# Patient Record
Sex: Female | Born: 1965 | Race: Black or African American | Hispanic: No | Smoking: Current every day smoker
Health system: Southern US, Community
[De-identification: ages and names within clinical notes are randomized; demographics above are authoritative.]

## PROBLEM LIST (undated history)

## (undated) DIAGNOSIS — E119 Type 2 diabetes mellitus without complications: Secondary | ICD-10-CM

## (undated) DIAGNOSIS — F209 Schizophrenia, unspecified: Secondary | ICD-10-CM

## (undated) DIAGNOSIS — I10 Essential (primary) hypertension: Secondary | ICD-10-CM

## (undated) DIAGNOSIS — H547 Unspecified visual loss: Secondary | ICD-10-CM

## (undated) DIAGNOSIS — D649 Anemia, unspecified: Secondary | ICD-10-CM

## (undated) DIAGNOSIS — C50919 Malignant neoplasm of unspecified site of unspecified female breast: Secondary | ICD-10-CM

## (undated) DIAGNOSIS — G629 Polyneuropathy, unspecified: Secondary | ICD-10-CM

## (undated) DIAGNOSIS — F329 Major depressive disorder, single episode, unspecified: Secondary | ICD-10-CM

## (undated) HISTORY — PX: TOE AMPUTATION: SHX809

---

## 2008-08-22 DIAGNOSIS — G629 Polyneuropathy, unspecified: Secondary | ICD-10-CM

## 2008-08-22 HISTORY — DX: Polyneuropathy, unspecified: G62.9

## 2009-10-03 ENCOUNTER — Inpatient Hospital Stay (HOSPITAL_COMMUNITY): Admission: EM | Admit: 2009-10-03 | Discharge: 2009-10-06 | Payer: Self-pay | Admitting: Emergency Medicine

## 2009-10-03 ENCOUNTER — Encounter (INDEPENDENT_AMBULATORY_CARE_PROVIDER_SITE_OTHER): Payer: Self-pay | Admitting: Internal Medicine

## 2009-10-05 ENCOUNTER — Ambulatory Visit: Payer: Self-pay | Admitting: Infectious Diseases

## 2009-10-12 ENCOUNTER — Inpatient Hospital Stay (HOSPITAL_COMMUNITY): Admission: EM | Admit: 2009-10-12 | Discharge: 2009-10-15 | Payer: Self-pay | Admitting: Emergency Medicine

## 2010-11-11 LAB — BASIC METABOLIC PANEL
BUN: 11 mg/dL (ref 6–23)
BUN: 8 mg/dL (ref 6–23)
BUN: 9 mg/dL (ref 6–23)
Calcium: 8.5 mg/dL (ref 8.4–10.5)
Calcium: 9.8 mg/dL (ref 8.4–10.5)
Creatinine, Ser: 0.56 mg/dL (ref 0.4–1.2)
Creatinine, Ser: 0.61 mg/dL (ref 0.4–1.2)
GFR calc Af Amer: >60 mL/min (ref >60)
GFR calc Af Amer: >60 mL/min (ref >60)
GFR calc non Af Amer: >60 mL/min (ref >60)
Glucose, Bld: 129 mg/dL — ABNORMAL HIGH (ref 70–99)
Glucose, Bld: 194 mg/dL — ABNORMAL HIGH (ref 70–99)
Glucose, Bld: 423 mg/dL — ABNORMAL HIGH (ref 70–99)
Sodium: 132 mEq/L — ABNORMAL LOW (ref 135–145)
Sodium: 134 mEq/L — ABNORMAL LOW (ref 135–145)

## 2010-11-11 LAB — DIFFERENTIAL
Basophils Absolute: 0 10*3/uL (ref 0.0–0.1)
Basophils Relative: 0 % (ref 0–1)
Eosinophils Absolute: 0.2 10*3/uL (ref 0.0–0.7)
Eosinophils Relative: 2 % (ref 0–5)
Lymphs Abs: 1.9 10*3/uL (ref 0.7–4.0)
Lymphs Abs: 2.2 10*3/uL (ref 0.7–4.0)
Monocytes Absolute: 0.1 10*3/uL (ref 0.1–1.0)
Monocytes Relative: 1 % — ABNORMAL LOW (ref 3–12)
Neutro Abs: 7.7 10*3/uL (ref 1.7–7.7)
Neutrophils Relative %: 75 % (ref 43–77)

## 2010-11-11 LAB — CBC
HCT: 32.9 % — ABNORMAL LOW (ref 36.0–46.0)
HCT: 38.9 % (ref 36.0–46.0)
Hemoglobin: 10.9 g/dL — ABNORMAL LOW (ref 12.0–15.0)
Hemoglobin: 11.1 g/dL — ABNORMAL LOW (ref 12.0–15.0)
Hemoglobin: 11.3 g/dL — ABNORMAL LOW (ref 12.0–15.0)
MCHC: 33.7 g/dL (ref 30.0–36.0)
Platelets: 415 10*3/uL — ABNORMAL HIGH (ref 150–400)
RDW: 13.6 % (ref 11.5–15.5)
RDW: 13.8 % (ref 11.5–15.5)
RDW: 13.9 % (ref 11.5–15.5)
RDW: 14.2 % (ref 11.5–15.5)

## 2010-11-11 LAB — GLUCOSE, CAPILLARY
Glucose-Capillary: 103 mg/dL — ABNORMAL HIGH (ref 70–99)
Glucose-Capillary: 165 mg/dL — ABNORMAL HIGH (ref 70–99)
Glucose-Capillary: 179 mg/dL — ABNORMAL HIGH (ref 70–99)
Glucose-Capillary: 207 mg/dL — ABNORMAL HIGH (ref 70–99)
Glucose-Capillary: 267 mg/dL — ABNORMAL HIGH (ref 70–99)
Glucose-Capillary: 85 mg/dL (ref 70–99)

## 2010-11-11 LAB — COMPREHENSIVE METABOLIC PANEL
ALT: 11 U/L (ref 0–35)
AST: 16 U/L (ref 0–37)
Albumin: 3.6 g/dL (ref 3.5–5.2)
Alkaline Phosphatase: 104 U/L (ref 39–117)
Calcium: 9.4 mg/dL (ref 8.4–10.5)
GFR calc Af Amer: >60 mL/min (ref >60)
Glucose, Bld: 312 mg/dL — ABNORMAL HIGH (ref 70–99)
Potassium: 3.9 mEq/L (ref 3.5–5.1)
Sodium: 135 mEq/L (ref 135–145)
Total Protein: 8.3 g/dL (ref 6.0–8.3)

## 2010-11-11 LAB — ANAEROBIC CULTURE

## 2010-11-11 LAB — WOUND CULTURE

## 2010-11-11 LAB — ABO/RH: ABO/RH(D): O POS

## 2010-11-11 LAB — HIGH SENSITIVITY CRP: CRP, High Sensitivity: 8.2 mg/L — ABNORMAL HIGH

## 2010-11-11 LAB — TYPE AND SCREEN
ABO/RH(D): O POS
Antibody Screen: NEGATIVE

## 2010-11-11 LAB — CULTURE, BLOOD (ROUTINE X 2)
Culture: NO GROWTH
Culture: NO GROWTH

## 2010-11-12 ENCOUNTER — Encounter (INDEPENDENT_AMBULATORY_CARE_PROVIDER_SITE_OTHER): Payer: Self-pay | Admitting: Internal Medicine

## 2010-11-18 NOTE — Miscellaneous (Signed)
Summary: Rehab Report  Rehab Report   Imported By: Arta Bruce 11/12/2010 14:35:48  _____________________________________________________________________  External Attachment:    Type:   Image     Comment:   External Document

## 2011-05-24 ENCOUNTER — Inpatient Hospital Stay (HOSPITAL_COMMUNITY)
Admission: EM | Admit: 2011-05-24 | Discharge: 2011-05-27 | DRG: 543 | Disposition: A | Discharge status: Nursing Home (Includes ICF) | Payer: Medicaid Other | Attending: Internal Medicine | Admitting: Internal Medicine

## 2011-05-24 ENCOUNTER — Emergency Department (HOSPITAL_COMMUNITY)
Admission: EM | Admit: 2011-05-24 | Discharge: 2011-05-24 | Discharge status: Against Medical Advice | Payer: Medicaid Other | Attending: Emergency Medicine | Admitting: Emergency Medicine

## 2011-05-24 ENCOUNTER — Emergency Department (HOSPITAL_COMMUNITY): Payer: Medicaid Other

## 2011-05-24 DIAGNOSIS — Z794 Long term (current) use of insulin: Secondary | ICD-10-CM

## 2011-05-24 DIAGNOSIS — E785 Hyperlipidemia, unspecified: Secondary | ICD-10-CM | POA: Diagnosis present

## 2011-05-24 DIAGNOSIS — I10 Essential (primary) hypertension: Secondary | ICD-10-CM | POA: Diagnosis present

## 2011-05-24 DIAGNOSIS — Z0389 Encounter for observation for other suspected diseases and conditions ruled out: Secondary | ICD-10-CM | POA: Insufficient documentation

## 2011-05-24 DIAGNOSIS — M84469A Pathological fracture, unspecified tibia and fibula, initial encounter for fracture: Principal | ICD-10-CM | POA: Diagnosis present

## 2011-05-24 DIAGNOSIS — E119 Type 2 diabetes mellitus without complications: Secondary | ICD-10-CM | POA: Diagnosis present

## 2011-05-24 DIAGNOSIS — Z91199 Patient's noncompliance with other medical treatment and regimen due to unspecified reason: Secondary | ICD-10-CM

## 2011-05-24 DIAGNOSIS — A5211 Tabes dorsalis: Secondary | ICD-10-CM | POA: Diagnosis present

## 2011-05-24 DIAGNOSIS — Z9119 Patient's noncompliance with other medical treatment and regimen: Secondary | ICD-10-CM

## 2011-05-24 DIAGNOSIS — Z79899 Other long term (current) drug therapy: Secondary | ICD-10-CM

## 2011-05-24 DIAGNOSIS — F259 Schizoaffective disorder, unspecified: Secondary | ICD-10-CM | POA: Diagnosis present

## 2011-05-24 DIAGNOSIS — F319 Bipolar disorder, unspecified: Secondary | ICD-10-CM | POA: Diagnosis present

## 2011-05-24 DIAGNOSIS — Z880 Allergy status to penicillin: Secondary | ICD-10-CM

## 2011-05-24 LAB — POCT I-STAT, CHEM 8
BUN: 14 mg/dL (ref 6–23)
Chloride: 102 mEq/L (ref 96–112)
Creatinine, Ser: 0.7 mg/dL (ref 0.50–1.10)
Glucose, Bld: 198 mg/dL — ABNORMAL HIGH (ref 70–99)
Potassium: 3.8 mEq/L (ref 3.5–5.1)

## 2011-05-24 LAB — DIFFERENTIAL
Basophils Absolute: 0 10*3/uL (ref 0.0–0.1)
Basophils Relative: 0 % (ref 0–1)
Eosinophils Absolute: 0.3 10*3/uL (ref 0.0–0.7)
Lymphs Abs: 1.9 10*3/uL (ref 0.7–4.0)
Neutrophils Relative %: 59 % (ref 43–77)

## 2011-05-24 LAB — CBC
Platelets: 364 10*3/uL (ref 150–400)
RBC: 3.81 MIL/uL — ABNORMAL LOW (ref 3.87–5.11)
WBC: 6.8 10*3/uL (ref 4.0–10.5)

## 2011-05-25 ENCOUNTER — Inpatient Hospital Stay (HOSPITAL_COMMUNITY): Payer: Medicaid Other

## 2011-05-25 LAB — HEMOGLOBIN A1C
Hgb A1c MFr Bld: 6.4 % — ABNORMAL HIGH (ref <5.7)
Mean Plasma Glucose: 137 mg/dL — ABNORMAL HIGH (ref <117)

## 2011-05-25 LAB — VALPROIC ACID LEVEL: Valproic Acid Lvl: <10 ug/mL — ABNORMAL LOW (ref 50.0–100.0)

## 2011-05-25 LAB — GLUCOSE, CAPILLARY: Glucose-Capillary: 184 mg/dL — ABNORMAL HIGH (ref 70–99)

## 2011-05-25 LAB — LIPID PANEL
LDL Cholesterol: 74 mg/dL (ref 0–99)
Total CHOL/HDL Ratio: 3.8 RATIO
VLDL: 14 mg/dL (ref 0–40)

## 2011-05-26 LAB — CBC
MCH: 28.4 pg (ref 26.0–34.0)
MCHC: 32.1 g/dL (ref 30.0–36.0)
MCV: 88.4 fL (ref 78.0–100.0)
Platelets: 341 10*3/uL (ref 150–400)

## 2011-05-26 LAB — COMPREHENSIVE METABOLIC PANEL
ALT: 17 U/L (ref 0–35)
AST: 20 U/L (ref 0–37)
Calcium: 9.9 mg/dL (ref 8.4–10.5)
Creatinine, Ser: 0.54 mg/dL (ref 0.50–1.10)
GFR calc Af Amer: >90 mL/min (ref >90)
GFR calc non Af Amer: >90 mL/min (ref >90)
Glucose, Bld: 82 mg/dL (ref 70–99)
Sodium: 140 mEq/L (ref 135–145)
Total Protein: 7.6 g/dL (ref 6.0–8.3)

## 2011-05-26 LAB — GLUCOSE, CAPILLARY
Glucose-Capillary: 126 mg/dL — ABNORMAL HIGH (ref 70–99)
Glucose-Capillary: 49 mg/dL — ABNORMAL LOW (ref 70–99)

## 2011-05-27 LAB — GLUCOSE, CAPILLARY
Glucose-Capillary: 152 mg/dL — ABNORMAL HIGH (ref 70–99)
Glucose-Capillary: 116 mg/dL — ABNORMAL HIGH (ref 70–99)
Glucose-Capillary: 173 mg/dL — ABNORMAL HIGH (ref 70–99)

## 2011-05-31 NOTE — Discharge Summary (Signed)
NAME:  MELIS, TROCHEZ NO.:  [id number]  MEDICAL RECORD NO.:  [id number]  LOCATION:  5038                         FACILITY:  [facility]  PHYSICIAN:  Lonia Blood, M.D.       DATE OF BIRTH:  05/30/1966  DATE OF ADMISSION:  05/24/2011 DATE OF DISCHARGE:                              DISCHARGE SUMMARY   PRIMARY CARE PHYSICIAN:  [facility], Dr. Cranford Mon.  PRIMARY ORTHOPEDIC SURGEON:  [facility] of orthopedic residency program, Dr. Dewaine Conger.  DISCHARGE DIAGNOSES: 1. 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 2. Schizoaffective disorder. 3. Hypertension. 4. Diabetes mellitus type 2. 5. Hyperlipidemia. 6. Morbid obesity.  DISCHARGE MEDICATIONS: 1. Hydrocodone/APAP 5/325 one tablet by mouth every 6 hours needed for     pain. 2. Tylenol 650 mg by mouth every 4 hours as needed for pain. 3. Depakote extended release 500 mg by mouth twice a day. 4. Gabapentin 400 mg by mouth 4 times a day. 5. Lipitor 10 mg daily. 6. Lisinopril 10 mg daily. 7. Metformin 500 mg twice a day. 8. Novolin 70/30, 60 units in the morning 55 units at night. 9. Risperdal 4 mg twice a day. 10.Seroquel 400 mg twice a day.  CONDITION ON DISCHARGE:  Ms. Ament is discharged back to her assisted living.  She is told not to bear weight on the left lower extremity. She is going to use her crutches.  She will follow up with Dr. Dewaine Conger from Orthopedics at the [facility].  PROCEDURE DURING THIS ADMISSION:  Ms. Buske underwent an MRI of her left ankle with findings of displaced fracture dislocation of the ankle with significant associated callus formation consistent with subacute age and neuropathic arthropathy, extensive moderate edema adjacent to the fracture, nonspecific in nature.  No gross bone destruction to suggest  osteomyelitis.  CONSULTATION DURING THIS ADMISSION:  The patient was seen in consultation by Dr. Toni Arthurs with [facility].  HISTORY AND PHYSICAL:  Refer dictated H and P done by Dr. Mikeal Hawthorne.  HOSPITAL COURSE:  Ms. Galvao is a 45 year old woman with known left ankle fracture came to the emergency room to [facility] for a second opinion regarding to her left ankle fracture.  In the middle of the night, there was a concern for possible osteomyelitis as she was admitted with empiric IV antibiotics.  On hospital day #2, we obtained an MRI of her left leg which was suggestive more of neuropathic arthropathy and chronic nonhealing fracture.  Clinically, the patient had no fever or leukocytosis or chills.  Records obtained from [facility] revealed that the patient has been having this problem of left ankle fracture for 2 months and that she has been noncompliant with treatment.  Talking to the patient's legal guardian it became apparent that she was unable to follow through with doctor's instruction by not bearing weight.  Ms. Blunck was seen in consultation by Dr. Toni Arthurs, orthopedic specialist.  He determined that based on the patient's ability to comply with treatment best way for will be to place a cast on the ankle.  Ms. Wurtz refused the cast placement.  She is going to be discharged home with instruction not to bear weight in the left ankle and as-needed medication for analgesia. At this point in time, even though Ms. Poppe has a guardian we cannot legally sedate her and confine her to bed for 6 weeks to operatively fix this fracture.  The patient has made her choice of attempting conservative treatment which has a chance of failing and the patient losing her limp.  She is willing to accept that chance.     Lonia Blood, M.D.     SL/MEDQ  D:  05/27/2011  T:  05/27/2011  Job:  [id number]  Electronically Signed by Lonia Blood  M.D. on 05/31/2011 05:08:00 PM

## 2011-06-01 LAB — CULTURE, BLOOD (ROUTINE X 2)
Culture  Setup Time: 201210040036
Culture: NO GROWTH

## 2011-06-04 NOTE — H&P (Signed)
NAME:  Regina Watson, Regina Watson           ACCOUNT NO.:  [id number]  MEDICAL RECORD NO.:  [id number]  LOCATION:  5038                         FACILITY:  [facility]  PHYSICIAN:  Lonia Blood, M.D.      DATE OF BIRTH:  06-21-1966  DATE OF ADMISSION:  05/24/2011 DATE OF DISCHARGE:                             HISTORY & PHYSICAL   PRIMARY CARE PHYSICIAN:  The patient is currently unassigned.  She sees a doctor in [place].  PRESENTING COMPLAINT:  Left lower extremity pain and swelling.  HISTORY OF PRESENT ILLNESS:  The patient is a 45 year old female with insulin-dependent diabetes type 2 who apparently had a fall and fracture of the left ankle about 2 to 3 months ago.  She saw an orthopedic surgeon in [place].  She was placed in a cast as well as the leg was wrapped according to her and a boot was given to her at some point. She has been told that if things did not go well, she may have to have her leg amputated.  The patient has had poor control of her diabetes with prior amputation of her left second toe here about 2 years ago. Since then, she has not improved tremendously.  She started having severe pain today here in [place], pain and swelling of the foot. She tried to use crutches but still the pain was persistent, so she decided to come in for further management.  She has had prior admission on the same leg back in February 2011 secondary to gangrenous foot and cellulitis.  Here, work up today indicated possible osteomyelitis of the foot.  She described her pain as 10 out of 10 as high, although currently down to 5/10 after medications.  No discharge.  No nausea, vomiting, or diarrhea.  No fever or chills.  PAST MEDICAL HISTORY: 1. Hypertension. 2. Diabetes. 3. Hyperlipidemia. 4. History of schizophrenia with bipolar component. 5. History of prior amputation of the left second toe. 6. Diabetic foot ulcer. 7. Morbid obesity.  ALLERGIES:  PENICILLIN.  CURRENT  MEDICATIONS:  Depakote, Glucophage, Novolin 70/30 insulin, and Risperdal.  SOCIAL HISTORY:  The patient lives in [place].  She apparently quit smoking.  She has been a smoker in the past.  She is widowed, but no children.  Denied any alcohol or IV drug use.  FAMILY HISTORY:  Significant mainly for diabetes.  REVIEW OF SYSTEMS:  All systems reviewed, currently are negative except per HPI.  PHYSICAL EXAMINATION:  VITAL SIGNS:  Temperature is 98.5 orally, blood pressure 123/82, pulse 98, respiratory rate of 18, and saturation 99% room air. GENERAL:  The patient is obese in no acute distress. HEENT:  PERRL.  EOMI.  No significant pallor. No jaundice.  No rhinorrhea. NECK:  Supple.  No JVD.  No lymphadenopathy. RESPIRATORY:  Good air entry bilaterally.  No significant wheezes.  No rales.  No crackles. CARDIOVASCULAR:  Slightly tachycardic. ABDOMEN:  Obese, soft, and nontender with positive bowel sounds. EXTREMITIES:  Left lower extremity is swollen, tender, darkened, status post amputation of the second toe.  It is very tender to touch especially the ankle over the metatarsals. SKIN:  Chronic dermatitis changes involving the left lower extremity but also some part of lower  extremities.  Multiple scars in the lower extremities bilaterally. PSYCHIATRIC:  The patient is slow to act, but in general, awake, alert, and oriented x3 with flat affect.  Not suicidal, not homicidal. NEUROLOGIC:  Nonfocal.  LABORATORY DATA:  Her Depakote level is less than 10.  White count is 6.8, hemoglobin of 11.4 with platelet count of 364, normal differentials.  Her sodium is 138, potassium 3.8, chloride is 102, glucose 198, BUN 14, and creatinine 0.70.  Left ankle x-ray showed left medial and lateral malleolar fracture dislocation with exuberant callus formation around the distal tubular fracture, this suggest nonacute healing fracture.  Left foot x-ray complete showed previous left second toe  amputation at the level of the distal metatarsal.  There is interval development of joint space irregularity of the proximal first metatarsal joint with sclerosis and periosteal reaction in the distal metatarsal suggesting healing fracture or infection.  ASSESSMENT AND PLAN:  This is a 45 year old female with complex medical history and known history of prior amputation due to nonhealing ulcer presenting with what appears to be possible osteomyelitis in addition to nonhealing fracture.  The patient is having pain now, but no white count, no fever.  Our plan will be: 1. Possible osteomyelitis.  We will admit the patient for observation.     She will probably require an MRI of the foot for further     clarification.  I will start her on IV vancomycin empirically.  She     has her orthopedic surgeon in [place] and I may suggest that     once her workup is completed and she starts taking antibiotics, she     may need to be referred back to the same orthopedic surgeon for     further treatment.  If however, there is need for her to see a     local orthopedic surgeon here, we will get someone consulted to     advice. 2. Diabetes.  Continue with home medication and sliding scale insulin. 3. Schizophrenia and bipolar disorder.  Again, continue with her home     medications.  Her Depakote seems to be on the low almost not     detectable, indicating probably noncompliance.  We will resume her     home medications and follow closely here.  The patient will follow     up later with her primary care physician. 4. Left ankle fracture.  Again, this is an old fracture that is     already known by her orthopedic surgeon in [place] which     makes this probably better for them to take care of her. 5. Hypertension.  She apparently takes lisinopril and we will give it     to her.  Further treatment will depend on the patient's response in     the hospital but hopefully we will stabilize her and  refer her back     to her orthopedic surgeon.     Lonia Blood, M.D.     Verlin Grills  D:  05/25/2011  T:  05/25/2011  Job:  [id number]  Electronically Signed by Lonia Blood M.D. on 06/04/2011 03:22:41 PM

## 2013-12-09 ENCOUNTER — Encounter (HOSPITAL_COMMUNITY): Payer: Self-pay | Admitting: Emergency Medicine

## 2013-12-09 ENCOUNTER — Emergency Department (HOSPITAL_COMMUNITY): Payer: Medicaid Other

## 2013-12-09 ENCOUNTER — Emergency Department (HOSPITAL_COMMUNITY)
Admission: EM | Admit: 2013-12-09 | Discharge: 2013-12-10 | Disposition: A | Discharge status: Home / Self Care | Payer: Medicaid Other | Attending: Emergency Medicine | Admitting: Emergency Medicine

## 2013-12-09 DIAGNOSIS — Z880 Allergy status to penicillin: Secondary | ICD-10-CM | POA: Insufficient documentation

## 2013-12-09 DIAGNOSIS — I10 Essential (primary) hypertension: Secondary | ICD-10-CM | POA: Insufficient documentation

## 2013-12-09 DIAGNOSIS — Z789 Other specified health status: Secondary | ICD-10-CM

## 2013-12-09 DIAGNOSIS — IMO0002 Reserved for concepts with insufficient information to code with codable children: Secondary | ICD-10-CM | POA: Insufficient documentation

## 2013-12-09 DIAGNOSIS — J40 Bronchitis, not specified as acute or chronic: Secondary | ICD-10-CM

## 2013-12-09 DIAGNOSIS — Z862 Personal history of diseases of the blood and blood-forming organs and certain disorders involving the immune mechanism: Secondary | ICD-10-CM | POA: Insufficient documentation

## 2013-12-09 DIAGNOSIS — J209 Acute bronchitis, unspecified: Secondary | ICD-10-CM | POA: Insufficient documentation

## 2013-12-09 DIAGNOSIS — F411 Generalized anxiety disorder: Secondary | ICD-10-CM | POA: Insufficient documentation

## 2013-12-09 DIAGNOSIS — F209 Schizophrenia, unspecified: Secondary | ICD-10-CM | POA: Insufficient documentation

## 2013-12-09 DIAGNOSIS — F17200 Nicotine dependence, unspecified, uncomplicated: Secondary | ICD-10-CM | POA: Insufficient documentation

## 2013-12-09 DIAGNOSIS — E119 Type 2 diabetes mellitus without complications: Secondary | ICD-10-CM | POA: Insufficient documentation

## 2013-12-09 HISTORY — DX: Anemia, unspecified: D64.9

## 2013-12-09 HISTORY — DX: Schizophrenia, unspecified: F20.9

## 2013-12-09 HISTORY — DX: Essential (primary) hypertension: I10

## 2013-12-09 HISTORY — DX: Type 2 diabetes mellitus without complications: E11.9

## 2013-12-09 LAB — URINALYSIS, ROUTINE W REFLEX MICROSCOPIC
Bilirubin Urine: NEGATIVE
Glucose, UA: NEGATIVE mg/dL
Hgb urine dipstick: NEGATIVE
Ketones, ur: NEGATIVE mg/dL
LEUKOCYTES UA: NEGATIVE
Nitrite: NEGATIVE
PROTEIN: NEGATIVE mg/dL
Specific Gravity, Urine: 1.02 (ref 1.005–1.030)
UROBILINOGEN UA: 0.2 mg/dL (ref 0.0–1.0)
pH: 6 (ref 5.0–8.0)

## 2013-12-09 LAB — COMPREHENSIVE METABOLIC PANEL
ALT: 20 U/L (ref 0–35)
AST: 21 U/L (ref 0–37)
Albumin: 3.9 g/dL (ref 3.5–5.2)
Alkaline Phosphatase: 110 U/L (ref 39–117)
BUN: 12 mg/dL (ref 6–23)
CALCIUM: 9.9 mg/dL (ref 8.4–10.5)
CO2: 25 mEq/L (ref 19–32)
CREATININE: 0.59 mg/dL (ref 0.50–1.10)
Chloride: 101 mEq/L (ref 96–112)
GLUCOSE: 186 mg/dL — AB (ref 70–99)
Potassium: 4.2 mEq/L (ref 3.7–5.3)
SODIUM: 138 meq/L (ref 137–147)
Total Bilirubin: 0.2 mg/dL — ABNORMAL LOW (ref 0.3–1.2)
Total Protein: 7.8 g/dL (ref 6.0–8.3)

## 2013-12-09 LAB — CBC WITH DIFFERENTIAL/PLATELET
BASOS ABS: 0 10*3/uL (ref 0.0–0.1)
Basophils Relative: 0 % (ref 0–1)
EOS PCT: 2 % (ref 0–5)
Eosinophils Absolute: 0.2 10*3/uL (ref 0.0–0.7)
HEMATOCRIT: 40.4 % (ref 36.0–46.0)
Hemoglobin: 13.3 g/dL (ref 12.0–15.0)
Lymphocytes Relative: 29 % (ref 12–46)
Lymphs Abs: 2.4 10*3/uL (ref 0.7–4.0)
MCH: 30.1 pg (ref 26.0–34.0)
MCHC: 32.9 g/dL (ref 30.0–36.0)
MCV: 91.4 fL (ref 78.0–100.0)
Monocytes Absolute: 0.5 10*3/uL (ref 0.1–1.0)
Monocytes Relative: 6 % (ref 3–12)
Neutro Abs: 5.1 10*3/uL (ref 1.7–7.7)
Neutrophils Relative %: 63 % (ref 43–77)
Platelets: 324 10*3/uL (ref 150–400)
RBC: 4.42 MIL/uL (ref 3.87–5.11)
RDW: 14.1 % (ref 11.5–15.5)
WBC: 8.2 10*3/uL (ref 4.0–10.5)

## 2013-12-09 LAB — ETHANOL: Alcohol, Ethyl (B): <11 mg/dL (ref 0–11)

## 2013-12-09 MED ORDER — ALBUTEROL SULFATE (2.5 MG/3ML) 0.083% IN NEBU
5.0000 mg | INHALATION_SOLUTION | Freq: Once | RESPIRATORY_TRACT | Status: DC
Start: 2013-12-09 — End: 2013-12-09

## 2013-12-09 MED ORDER — ALBUTEROL SULFATE (2.5 MG/3ML) 0.083% IN NEBU
2.5000 mg | INHALATION_SOLUTION | Freq: Once | RESPIRATORY_TRACT | Status: AC
  Administered 2013-12-09: 2.5 mg via RESPIRATORY_TRACT
  Filled 2013-12-09: qty 3

## 2013-12-09 MED ORDER — IPRATROPIUM BROMIDE 0.02 % IN SOLN: 0.5000 mg | Freq: Once | RESPIRATORY_TRACT | Status: DC

## 2013-12-09 MED ORDER — IPRATROPIUM-ALBUTEROL 0.5-2.5 (3) MG/3ML IN SOLN
3.0000 mL | Freq: Once | RESPIRATORY_TRACT | Status: AC
  Administered 2013-12-09: 3 mL via RESPIRATORY_TRACT
  Filled 2013-12-09: qty 3
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
## 2013-12-10 MED ORDER — ALBUTEROL SULFATE HFA 108 (90 BASE) MCG/ACT IN AERS
2.0000 | INHALATION_SPRAY | RESPIRATORY_TRACT | Status: DC | PRN
  Administered 2013-12-10: 2 via RESPIRATORY_TRACT
  Filled 2013-12-10: qty 6.7

## 2013-12-10 MED ORDER — AZITHROMYCIN 250 MG PO TABS: ORAL_TABLET | ORAL | Status: DC

## 2013-12-10 MED ORDER — AEROCHAMBER Z-STAT PLUS/MEDIUM MISC: 1.0000 | Freq: Once | Status: DC

## 2013-12-10 NOTE — Discharge Instructions (Signed)
Contact your guardian tomorrow and discuss your concerns about your currently living situation.  Return to the ED if you feel you may do something to hurt yourself or someone else. Use the inhaler 2 puffs every 4-6 hrs for wheezing. Take the antibiotic until gone.

## 2016-01-08 ENCOUNTER — Emergency Department (HOSPITAL_COMMUNITY)
Admission: EM | Admit: 2016-01-08 | Discharge: 2016-01-09 | Discharge status: Against Medical Advice | Payer: Medicaid Other | Attending: Emergency Medicine | Admitting: Emergency Medicine

## 2016-01-08 ENCOUNTER — Encounter (HOSPITAL_COMMUNITY): Payer: Self-pay | Admitting: Emergency Medicine

## 2016-01-08 DIAGNOSIS — Z041 Encounter for examination and observation following transport accident: Secondary | ICD-10-CM | POA: Diagnosis present

## 2016-01-08 DIAGNOSIS — F209 Schizophrenia, unspecified: Secondary | ICD-10-CM | POA: Insufficient documentation

## 2016-01-08 DIAGNOSIS — E119 Type 2 diabetes mellitus without complications: Secondary | ICD-10-CM | POA: Insufficient documentation

## 2016-01-08 DIAGNOSIS — F17200 Nicotine dependence, unspecified, uncomplicated: Secondary | ICD-10-CM | POA: Insufficient documentation

## 2016-01-08 DIAGNOSIS — I10 Essential (primary) hypertension: Secondary | ICD-10-CM | POA: Diagnosis not present

## 2016-01-08 LAB — CBC WITH DIFFERENTIAL/PLATELET
BASOS ABS: 0 10*3/uL (ref 0.0–0.1)
Basophils Relative: 0 %
EOS ABS: 0.3 10*3/uL (ref 0.0–0.7)
EOS PCT: 4 %
HCT: 34.6 % — ABNORMAL LOW (ref 36.0–46.0)
Hemoglobin: 11.4 g/dL — ABNORMAL LOW (ref 12.0–15.0)
Lymphocytes Relative: 37 %
Lymphs Abs: 3 10*3/uL (ref 0.7–4.0)
MCH: 31.5 pg (ref 26.0–34.0)
MCHC: 32.9 g/dL (ref 30.0–36.0)
MCV: 95.6 fL (ref 78.0–100.0)
Monocytes Absolute: 0.5 10*3/uL (ref 0.1–1.0)
Monocytes Relative: 6 %
Neutro Abs: 4.3 10*3/uL (ref 1.7–7.7)
Neutrophils Relative %: 53 %
PLATELETS: 248 10*3/uL (ref 150–400)
RBC: 3.62 MIL/uL — ABNORMAL LOW (ref 3.87–5.11)
RDW: 12.9 % (ref 11.5–15.5)
WBC: 8.2 10*3/uL (ref 4.0–10.5)

## 2016-01-08 NOTE — ED Notes (Signed)
Patient from [facility] with complaint of visual and auditory hallucinations. States "I think the house if haunted, I see these people when I close my eyes and then when I open them and they tell me they are going to kill me." Denies SI/HI.

## 2016-01-09 LAB — URINALYSIS, ROUTINE W REFLEX MICROSCOPIC
BILIRUBIN URINE: NEGATIVE
GLUCOSE, UA: NEGATIVE mg/dL
Hgb urine dipstick: NEGATIVE
Ketones, ur: 15 mg/dL — AB
LEUKOCYTES UA: NEGATIVE
Nitrite: NEGATIVE
PH: 5.5 (ref 5.0–8.0)
Protein, ur: NEGATIVE mg/dL
Specific Gravity, Urine: 1.025 (ref 1.005–1.030)

## 2016-01-09 LAB — COMPREHENSIVE METABOLIC PANEL
ALT: 19 U/L (ref 14–54)
ANION GAP: 9 (ref 5–15)
AST: 19 U/L (ref 15–41)
Albumin: 3.7 g/dL (ref 3.5–5.0)
Alkaline Phosphatase: 67 U/L (ref 38–126)
BUN: 20 mg/dL (ref 6–20)
CHLORIDE: 105 mmol/L (ref 101–111)
CO2: 23 mmol/L (ref 22–32)
Calcium: 9.2 mg/dL (ref 8.9–10.3)
Creatinine, Ser: 0.62 mg/dL (ref 0.44–1.00)
Glucose, Bld: 133 mg/dL — ABNORMAL HIGH (ref 65–99)
Potassium: 4 mmol/L (ref 3.5–5.1)
SODIUM: 137 mmol/L (ref 135–145)
Total Bilirubin: 0.3 mg/dL (ref 0.3–1.2)
Total Protein: 7.1 g/dL (ref 6.5–8.1)

## 2016-01-09 LAB — RAPID URINE DRUG SCREEN, HOSP PERFORMED
AMPHETAMINES: NOT DETECTED
Barbiturates: NOT DETECTED
Benzodiazepines: NOT DETECTED
COCAINE: NOT DETECTED
OPIATES: NOT DETECTED
Tetrahydrocannabinol: NOT DETECTED

## 2016-01-09 LAB — PREGNANCY, URINE: Preg Test, Ur: NEGATIVE

## 2016-01-09 LAB — ETHANOL

## 2016-01-09 NOTE — ED Provider Notes (Signed)
  Physical Exam  BP 152/88 mmHg  Pulse 83  Temp(Src) 98 F (36.7 C) (Oral)  Resp 16  Ht 5\' 9"  (1.753 m)  Wt 205 lb (92.987 kg)  BMI 30.26 kg/m2  SpO2 97%  LMP 12/26/2015  Physical Exam  ED Course  Procedures  MDM Patient no longer wants to stay. Reviewed notes and with psychiatry. They feel she would benefit from inpatient treatment but she does not meet criteria for involuntary commitment at this time. Patient is not willing to stay. Aware of risks. Will discharge AMA.      Davonna Belling, MD 01/09/16 1336

## 2016-01-09 NOTE — [facility] Counselor (Signed)
Clinician consulted with Elmarie Shiley NP who determined that pt meets inpatient criteria at this time. TTS to seek placement. RN notified.   Bedelia Person, M.S., LPCA, [place], [facility] Licensed Professional Counselor Associate  Triage Specialist  [facility]  Therapeutic Triage Services Phone: [phone] Fax: [phone]

## 2016-01-09 NOTE — ED Notes (Signed)
Pt given lunch meal tray. 

## 2016-01-09 NOTE — BH Assessment (Signed)
RN notified counselor that pt did not want to go inpatient and states that she feels like she can follow up with her outpatient provider at [facility] and she also stated in the assessment that she has an ACT team. Counselor consulted with Elmarie Shiley, NP who did not feel like she was a safety risk to herself or others and could be cleared for discharge to her outpatient provider. Pt is voluntary and does not meet involuntary commitment criteria. RN notified that she is cleared by psychiatry to be discharged if EDP agrees with this assessment.   Bedelia Person, M.S., LPCA, [place], [facility] Licensed Professional Counselor Associate  Triage Specialist  [facility]  Therapeutic Triage Services Phone: [phone] Fax: [phone]

## 2016-01-09 NOTE — ED Notes (Signed)
Spoke with Erasmo Downer at [facility]. States that she will speak to NP when available to discuss situation. Will call back with update ASAP. Pt notified of plan and verbalized understanding.

## 2016-01-09 NOTE — ED Notes (Addendum)
Dr. Alvino Chapel notified.

## 2016-01-09 NOTE — ED Notes (Signed)
Pt was able to participate in TTS this morning.  Continues to be very sleepy but is easily aroused when talked to.  States her meds make her this drowsy on a daily basis and that is why she feels her doses need to be adjusted.

## 2016-01-09 NOTE — ED Notes (Signed)
Got in touch with [facility]. States they will send someone ASAP to pick up patient.   [phone]

## 2016-01-09 NOTE — ED Notes (Signed)
Pt awake at this time eating breakfast.  Will notify [facility] pt can participate in TTS at this time.

## 2016-01-09 NOTE — Discharge Instructions (Signed)
Schizophrenia °Schizophrenia is a mental illness. It may cause disturbed or disorganized thinking, speech, or behavior. People with schizophrenia have problems functioning in one or more areas of life: work, school, home, or relationships. People with schizophrenia are at increased risk for suicide, certain chronic physical illnesses, and unhealthy behaviors, such as smoking and drug use. °People who have family members with schizophrenia are at higher risk of developing the illness. Schizophrenia affects men and women equally but usually appears at an earlier age (teenage or early adult years) in men.  °SYMPTOMS °The earliest symptoms are often subtle (prodrome) and may go unnoticed until the illness becomes more severe (first-break psychosis). Symptoms of schizophrenia may be continuous or may come and go in severity. Episodes often are triggered by major life events, such as family stress, college, military service, marriage, pregnancy or child birth, divorce, or loss of a loved one. People with schizophrenia may see, hear, or feel things that do not exist (hallucinations). They may have false beliefs in spite of obvious proof to the contrary (delusions). Sometimes speech is incoherent or behavior is odd or withdrawn.  °DIAGNOSIS °Schizophrenia is diagnosed through an assessment by your caregiver. Your caregiver will ask questions about your thoughts, behavior, mood, and ability to function in daily life. Your caregiver may ask questions about your medical history and use of alcohol or drugs, including prescription medication. Your caregiver may also order blood tests and imaging exams. Certain medical conditions and substances can cause symptoms that resemble schizophrenia. Your caregiver may refer you to a mental health specialist for evaluation. There are three major criterion for a diagnosis of schizophrenia: °· Two or more of the following five symptoms are present for a month or longer: °¨ Delusions. Often  the delusions are that you are being attacked, harassed, cheated, persecuted or conspired against (persecutory delusions). °¨ Hallucinations.   °¨ Disorganized speech that does not make sense to others. °¨ Grossly disorganized (confused or unfocused) behavior or extremely overactive or underactive motor activity (catatonia). °¨ Negative symptoms such as bland or blunted emotions (flat affect), loss of will power (avolition), and withdrawal from social contacts (social isolation). °· Level of functioning in one or more major areas of life (work, school, relationships, or self-care) is markedly below the level of functioning before the onset of illness.   °· There are continuous signs of illness (either mild symptoms or decreased level of functioning) for at least 6 months or longer. °TREATMENT  °Schizophrenia is a long-term illness. It is best controlled with continuous treatment rather than treatment only when symptoms occur. The following treatments are used to manage schizophrenia: °· Medication--Medication is the most effective and important form of treatment for schizophrenia. Antipsychotic medications are usually prescribed to help manage schizophrenia. Other types of medication may be added to relieve any symptoms that may occur despite the use of antipsychotic medications. °· Counseling or talk therapy--Individual, group, or family counseling may be helpful in providing education, support, and guidance. Many people with schizophrenia also benefit from social skills and job skills (vocational) training. °A combination of medication and counseling is best for managing the disorder over time. A procedure in which electricity is applied to the brain through the scalp (electroconvulsive therapy) may be used to treat catatonic schizophrenia or schizophrenia in people who cannot take or do not respond to medication and counseling. °  °This information is not intended to replace advice given to you by your health  care provider. Make sure you discuss any questions you have with   your health care provider. °  °Document Released: 08/05/2000 Document Revised: 08/29/2014 Document Reviewed: 10/31/2012 °Elsevier Interactive Patient Education ©2016 [employer] ° °

## 2016-01-09 NOTE — ED Provider Notes (Signed)
CSN: [id number]     Arrival date & time 01/08/16  2205 History   First MD Initiated Contact with Patient 01/08/16 2300     Chief Complaint  Patient presents with  . V70.1     (Consider location/radiation/quality/duration/timing/severity/associated sxs/prior Treatment) The history is provided by the patient.   Regina Watson is a 50 y.o. female with a history of schizophrenia, DM and htn presenting from her group home residence with complaints of visual and auditory hallucinations which have been present for a few days.  She describes seeing lots of people floating in her room who speak to her in anger, stating "they hate me and tell me they are going to kill me. "   She endorses feeling scared and is afraid to close her eyes. She is very drowsy currently and states she was given her "nighttime medicine" before being sent here and states her risperdal was recently increased.  She denies suicidal or homicidal ideation and has no other complaints.    Past Medical History  Diagnosis Date  . Diabetes mellitus without complication ([place])   . Hypertension   . Schizophrenia ([place])   . Anemia    Past Surgical History  Procedure Laterality Date  . Toe amputation     History reviewed. No pertinent family history. Social History  Substance Use Topics  . Smoking status: Current Every Day Smoker  . Smokeless tobacco: None  . Alcohol Use: No   OB History    No data available     Review of Systems    Allergies  Penicillins  Home Medications   Prior to Admission medications   Medication Sig Start Date End Date Taking? Authorizing Provider  azithromycin (ZITHROMAX Z-PAK) 250 MG tablet Take 2 po the first day then once a day for the next 4 days. 12/10/13   Rolland Porter, MD   BP 165/92 mmHg  Pulse 94  Temp(Src) 97.9 F (36.6 C) (Oral)  Resp 20  Ht 5\' 9"  (1.753 m)  Wt 92.987 kg  BMI 30.26 kg/m2  SpO2 99%  LMP 12/26/2015 Physical Exam  Constitutional: She appears well-developed  and well-nourished. No distress.  Drowsy.  Sleeping upon first contact with pt.  HENT:  Head: Normocephalic and atraumatic.  Eyes: Conjunctivae are normal.  Neck: Normal range of motion.  Cardiovascular: Normal rate, regular rhythm, normal heart sounds and intact distal pulses.   Pulmonary/Chest: Effort normal and breath sounds normal. She has no wheezes.  Abdominal: Soft. Bowel sounds are normal. There is no tenderness.  Musculoskeletal: Normal range of motion.  Neurological: She is alert. She has normal strength. No cranial nerve deficit.  Skin: Skin is warm and dry.  Psychiatric: Her speech is not rapid and/or pressured. She is slowed and actively hallucinating. Thought content is delusional.  Sleepy.  Drifts to sleep when not stimulated.  Nursing note and vitals reviewed.   ED Course  Procedures (including critical care time) Labs Review Labs Reviewed  CBC WITH DIFFERENTIAL/PLATELET - Abnormal; Notable for the following:    RBC 3.62 (*)    Hemoglobin 11.4 (*)    HCT 34.6 (*)    All other components within normal limits  COMPREHENSIVE METABOLIC PANEL - Abnormal; Notable for the following:    Glucose, Bld 133 (*)    All other components within normal limits  URINALYSIS, ROUTINE W REFLEX MICROSCOPIC (NOT AT [facility]) - Abnormal; Notable for the following:    Ketones, ur 15 (*)    All other components within normal limits  ETHANOL  URINE RAPID DRUG SCREEN, HOSP PERFORMED  PREGNANCY, URINE    Imaging Review No results found. I have personally reviewed and evaluated these images and lab results as part of my medical decision-making.   EKG Interpretation None      MDM   Final diagnoses:  Schizophrenia, unspecified type ([place])    Pt medically cleared for TTS consult.  She is cooperative at this time, here voluntarily with no suicidal or homicidal ideation.  Pt is delusional with visual and auditory hallucinations but does not appear to be a danger to herself or others.  She  would benefit from TTS consult.  Nursing staff currently working on obtaining list of current meds from group home.   Psych holding orders placed.    Evalee Jefferson, PA-C 01/09/16 [place], DO 01/09/16 [phone]

## 2016-01-09 NOTE — ED Notes (Signed)
Pt called this RN into room and stated that she wanted to go home and will follow up with PCP. States she is having no thoughts of SI/HI. Pt informed that she has been recommended for inpatient treatment, but will notify EDP and [facility] of her concerns.

## 2016-01-09 NOTE — ED Notes (Signed)
Spoke with Erasmo Downer at [facility], states that she is clear psychiatrically to go home as long as EDP has no concerns. Will notify EDP.

## 2016-01-09 NOTE — ED Notes (Signed)
[facility] representative arrived to transport patient back to facility.

## 2016-01-09 NOTE — ED Notes (Signed)
Pt still unable to remain alert enough for TTS. Spoke with Ernestene Kiel faxed over, reviewed by [facility], placed in patient bin
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
## 2016-01-27 ENCOUNTER — Emergency Department (HOSPITAL_COMMUNITY)
Admission: EM | Admit: 2016-01-27 | Discharge: 2016-01-29 | Disposition: A | Discharge status: Home / Self Care | Payer: Medicaid Other | Attending: Emergency Medicine | Admitting: Emergency Medicine

## 2016-01-27 ENCOUNTER — Encounter (HOSPITAL_COMMUNITY): Payer: Self-pay | Admitting: Emergency Medicine

## 2016-01-27 DIAGNOSIS — E119 Type 2 diabetes mellitus without complications: Secondary | ICD-10-CM | POA: Insufficient documentation

## 2016-01-27 DIAGNOSIS — Z7984 Long term (current) use of oral hypoglycemic drugs: Secondary | ICD-10-CM | POA: Diagnosis not present

## 2016-01-27 DIAGNOSIS — F209 Schizophrenia, unspecified: Secondary | ICD-10-CM | POA: Insufficient documentation

## 2016-01-27 DIAGNOSIS — Z79899 Other long term (current) drug therapy: Secondary | ICD-10-CM | POA: Insufficient documentation

## 2016-01-27 DIAGNOSIS — F17200 Nicotine dependence, unspecified, uncomplicated: Secondary | ICD-10-CM | POA: Insufficient documentation

## 2016-01-27 DIAGNOSIS — N390 Urinary tract infection, site not specified: Secondary | ICD-10-CM | POA: Insufficient documentation

## 2016-01-27 DIAGNOSIS — R443 Hallucinations, unspecified: Secondary | ICD-10-CM | POA: Diagnosis present

## 2016-01-27 DIAGNOSIS — I10 Essential (primary) hypertension: Secondary | ICD-10-CM | POA: Diagnosis not present

## 2016-01-27 LAB — CBC
HCT: 39.2 % (ref 36.0–46.0)
HEMOGLOBIN: 13.1 g/dL (ref 12.0–15.0)
MCH: 31.6 pg (ref 26.0–34.0)
MCHC: 33.4 g/dL (ref 30.0–36.0)
MCV: 94.7 fL (ref 78.0–100.0)
Platelets: 240 10*3/uL (ref 150–400)
RBC: 4.14 MIL/uL (ref 3.87–5.11)
RDW: 13.1 % (ref 11.5–15.5)
WBC: 8 10*3/uL (ref 4.0–10.5)

## 2016-01-27 LAB — COMPREHENSIVE METABOLIC PANEL
ALT: 23 U/L (ref 14–54)
AST: 23 U/L (ref 15–41)
Albumin: 4.4 g/dL (ref 3.5–5.0)
Alkaline Phosphatase: 87 U/L (ref 38–126)
Anion gap: 5 (ref 5–15)
BILIRUBIN TOTAL: 0.2 mg/dL — AB (ref 0.3–1.2)
BUN: 14 mg/dL (ref 6–20)
CALCIUM: 9.4 mg/dL (ref 8.9–10.3)
CO2: 28 mmol/L (ref 22–32)
CREATININE: 0.51 mg/dL (ref 0.44–1.00)
Chloride: 105 mmol/L (ref 101–111)
GFR calc Af Amer: >60 mL/min (ref >60)
Glucose, Bld: 123 mg/dL — ABNORMAL HIGH (ref 65–99)
Potassium: 4 mmol/L (ref 3.5–5.1)
SODIUM: 138 mmol/L (ref 135–145)
TOTAL PROTEIN: 8.2 g/dL — AB (ref 6.5–8.1)

## 2016-01-27 LAB — URINE MICROSCOPIC-ADD ON

## 2016-01-27 LAB — SALICYLATE LEVEL: Salicylate Lvl: <4 mg/dL (ref 2.8–30.0)

## 2016-01-27 LAB — URINALYSIS, ROUTINE W REFLEX MICROSCOPIC
BILIRUBIN URINE: NEGATIVE
Glucose, UA: 250 mg/dL — AB
Ketones, ur: 15 mg/dL — AB
NITRITE: POSITIVE — AB
Protein, ur: >300 mg/dL — AB
SPECIFIC GRAVITY, URINE: 1.02 (ref 1.005–1.030)
pH: 6.5 (ref 5.0–8.0)

## 2016-01-27 LAB — RAPID URINE DRUG SCREEN, HOSP PERFORMED
Amphetamines: NOT DETECTED
Barbiturates: NOT DETECTED
Benzodiazepines: NOT DETECTED
Cocaine: NOT DETECTED
OPIATES: NOT DETECTED
TETRAHYDROCANNABINOL: NOT DETECTED

## 2016-01-27 LAB — ETHANOL: Alcohol, Ethyl (B): <5 mg/dL (ref <5)

## 2016-01-27 LAB — PREGNANCY, URINE: PREG TEST UR: NEGATIVE

## 2016-01-27 LAB — ACETAMINOPHEN LEVEL

## 2016-01-27 MED ORDER — BENZTROPINE MESYLATE 1 MG PO TABS
0.5000 mg | ORAL_TABLET | Freq: Two times a day (BID) | ORAL | Status: DC
  Administered 2016-01-27 – 2016-01-29 (×5): 0.5 mg via ORAL
  Filled 2016-01-27 (×5): qty 1

## 2016-01-27 MED ORDER — QUETIAPINE FUMARATE ER 400 MG PO TB24
800.0000 mg | ORAL_TABLET | Freq: Every day | ORAL | Status: DC
  Administered 2016-01-27 – 2016-01-28 (×2): 800 mg via ORAL
  Filled 2016-01-27 (×5): qty 2

## 2016-01-27 MED ORDER — ALUM & MAG HYDROXIDE-SIMETH 200-200-20 MG/5ML PO SUSP: 30.0000 mL | ORAL | Status: DC | PRN

## 2016-01-27 MED ORDER — ATORVASTATIN CALCIUM 10 MG PO TABS
10.0000 mg | ORAL_TABLET | Freq: Every day | ORAL | Status: DC
  Administered 2016-01-28 – 2016-01-29 (×2): 10 mg via ORAL
  Filled 2016-01-27 (×5): qty 1

## 2016-01-27 MED ORDER — LORAZEPAM 1 MG PO TABS
1.0000 mg | ORAL_TABLET | Freq: Three times a day (TID) | ORAL | Status: DC | PRN
  Filled 2016-01-27: qty 1

## 2016-01-27 MED ORDER — METFORMIN HCL 500 MG PO TABS
1000.0000 mg | ORAL_TABLET | Freq: Two times a day (BID) | ORAL | Status: DC
  Administered 2016-01-27 – 2016-01-29 (×3): 1000 mg via ORAL
  Filled 2016-01-27 (×4): qty 2

## 2016-01-27 MED ORDER — HYDROXYZINE HCL 25 MG PO TABS
25.0000 mg | ORAL_TABLET | Freq: Three times a day (TID) | ORAL | Status: DC
  Administered 2016-01-27 – 2016-01-29 (×5): 25 mg via ORAL
  Filled 2016-01-27 (×6): qty 1

## 2016-01-27 MED ORDER — GABAPENTIN 400 MG PO CAPS
400.0000 mg | ORAL_CAPSULE | Freq: Four times a day (QID) | ORAL | Status: DC
  Administered 2016-01-27 – 2016-01-29 (×7): 400 mg via ORAL
  Filled 2016-01-27 (×9): qty 1

## 2016-01-27 MED ORDER — CEPHALEXIN 500 MG PO CAPS
500.0000 mg | ORAL_CAPSULE | Freq: Four times a day (QID) | ORAL | Status: DC
  Administered 2016-01-27 – 2016-01-29 (×7): 500 mg via ORAL
  Filled 2016-01-27 (×7): qty 1

## 2016-01-27 MED ORDER — DIVALPROEX SODIUM 250 MG PO DR TAB
1000.0000 mg | DELAYED_RELEASE_TABLET | Freq: Every day | ORAL | Status: DC
  Administered 2016-01-27 – 2016-01-28 (×2): 1000 mg via ORAL
  Filled 2016-01-27 (×2): qty 4

## 2016-01-27 MED ORDER — RISPERIDONE 1 MG PO TABS
6.0000 mg | ORAL_TABLET | Freq: Every day | ORAL | Status: DC
  Administered 2016-01-27 – 2016-01-28 (×2): 6 mg via ORAL
  Filled 2016-01-27 (×3): qty 6
  Filled 2016-01-27 (×2): qty 2

## 2016-01-27 MED ORDER — LISINOPRIL 10 MG PO TABS
10.0000 mg | ORAL_TABLET | Freq: Every day | ORAL | Status: DC
  Administered 2016-01-27 – 2016-01-29 (×3): 10 mg via ORAL
  Filled 2016-01-27 (×3): qty 1

## 2016-01-27 MED ORDER — CEFTRIAXONE SODIUM 1 G IJ SOLR
1.0000 g | Freq: Once | INTRAMUSCULAR | Status: DC
  Filled 2016-01-27: qty 10

## 2016-01-27 MED ORDER — ACETAMINOPHEN 325 MG PO TABS: 650.0000 mg | ORAL_TABLET | ORAL | Status: DC | PRN

## 2016-01-27 MED ORDER — ONDANSETRON HCL 4 MG PO TABS: 4.0000 mg | ORAL_TABLET | Freq: Three times a day (TID) | ORAL | Status: DC | PRN

## 2016-01-27 MED ORDER — METFORMIN HCL 500 MG PO TABS
ORAL_TABLET | ORAL | Status: AC
  Filled 2016-01-27: qty 1

## 2016-01-27 MED ORDER — LACTULOSE 10 GM/15ML PO SOLN
20.0000 g | Freq: Two times a day (BID) | ORAL | Status: DC
  Administered 2016-01-27 – 2016-01-29 (×5): 20 g via ORAL
  Filled 2016-01-27 (×5): qty 30
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
## 2016-01-27 NOTE — ED Notes (Signed)
Patient given water at this time. Patient informed a urine sample is needed.

## 2016-01-27 NOTE — ED Notes (Signed)
Roomed stripped. Pt now in paper scrubs. Security aware to wand pt. Pt belongings locked in locker.
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
## 2016-01-27 NOTE — ED Provider Notes (Signed)
CSN: [id number]     Arrival date & time 01/27/16  [id number] History   First MD Initiated Contact with Patient 01/27/16 [phone]     Chief Complaint  Patient presents with  . Hallucinations    Regina Watson is a 50 y.o. female With a history of diabetes and schizophrenia who presents to the emergency department [facility] reports the patient has been having increase in psychotic behavior. The patient lives at [place] home. The patient tells me that there is a cannibal after her. He is been swinging a hammer and is out to get her. She requests that she knew back to [place] as she is done with the [place]. She tells me she has auditory and visual hallucinations at times. [facility] staff report that the man is real but the things that he is doing are not real. [facility] staff report that she has had increased psychotic behaviors recently. The patient states that her head is a mass and her nerves are shot because of this cannibal after her. The patient denies suicidal or homicidal ideations. She's had a recent changes to her psychiatric medications without good results. The patient denies any physical complaints. She reports her diabetes is well controlled.  The history is provided by the patient and a caregiver. No language interpreter was used.    Past Medical History  Diagnosis Date  . Diabetes mellitus without complication ([place])   . Hypertension   . Schizophrenia ([place])   . Anemia    Past Surgical History  Procedure Laterality Date  . Toe amputation     History reviewed. No pertinent family history. Social History  Substance Use Topics  . Smoking status: Current Every Day Smoker  . Smokeless tobacco: None  . Alcohol Use: No   OB History    No data available     Review of Systems  Constitutional: Negative for fever and chills.  HENT: Negative for congestion and sore throat.   Eyes: Negative for visual disturbance.  Respiratory: Negative for cough and shortness of  breath.   Cardiovascular: Negative for chest pain.  Gastrointestinal: Negative for nausea, vomiting, abdominal pain and diarrhea.  Genitourinary: Negative for dysuria.  Musculoskeletal: Negative for back pain.  Skin: Negative for rash.  Neurological: Negative for headaches.  Psychiatric/Behavioral: Positive for hallucinations. Negative for suicidal ideas.      Allergies  Bactrim and Penicillins  Home Medications   Prior to Admission medications   Medication Sig Start Date End Date Taking? Authorizing Provider  atorvastatin (LIPITOR) 10 MG tablet Take 10 mg by mouth daily.   Yes Historical Provider, MD  benztropine (COGENTIN) 0.5 MG tablet Take 0.5 mg by mouth 2 (two) times daily.   Yes Historical Provider, MD  colchicine 0.6 MG tablet Take 0.6 mg by mouth daily.   Yes Historical Provider, MD  divalproex (DEPAKOTE) 500 MG DR tablet Take 1,000 mg by mouth at bedtime.    Yes Historical Provider, MD  gabapentin (NEURONTIN) 400 MG capsule Take 400 mg by mouth 4 (four) times daily.    Yes Historical Provider, MD  hydrOXYzine (ATARAX/VISTARIL) 25 MG tablet Take 25 mg by mouth 3 (three) times daily.   Yes Historical Provider, MD  lactulose (CHRONULAC) 10 GM/15ML solution Take 20 g by mouth 2 (two) times daily.   Yes Historical Provider, MD  lisinopril (PRINIVIL,ZESTRIL) 10 MG tablet Take 10 mg by mouth daily.   Yes Historical Provider, MD  metFORMIN (GLUCOPHAGE) 1000 MG tablet Take 1,000  mg by mouth 2 (two) times daily with a meal.   Yes Historical Provider, MD  QUEtiapine (SEROQUEL XR) 400 MG 24 hr tablet Take 800 mg by mouth at bedtime.   Yes Historical Provider, MD  risperiDONE (RISPERDAL) 3 MG tablet Take 6 mg by mouth at bedtime.    Yes Historical Provider, MD  salicylic acid-lactic acid 17 % external solution Apply 1 application topically daily. Apply to callus on great toe.   Yes Historical Provider, MD  traMADol-acetaminophen (ULTRACET) 37.5-325 MG tablet Take 1 tablet by mouth 2 (two)  times daily.   Yes Historical Provider, MD  triamcinolone cream (KENALOG) 0.1 % Apply 1 application topically 2 (two) times daily.   Yes Historical Provider, MD   BP 150/83 mmHg  Pulse 80  Temp(Src) 97.4 F (36.3 C) (Oral)  Resp 18  SpO2 99%  LMP 12/26/2015 Physical Exam  Constitutional: She appears well-developed and well-nourished. No distress.  Nontoxic appearing.  HENT:  Head: Normocephalic and atraumatic.  Mouth/Throat: Oropharynx is clear and moist.  Eyes: Conjunctivae are normal. Pupils are equal, round, and reactive to light. Right eye exhibits no discharge. Left eye exhibits no discharge.  Neck: Neck supple.  Cardiovascular: Normal rate, regular rhythm, normal heart sounds and intact distal pulses.   Pulmonary/Chest: Effort normal and breath sounds normal. No respiratory distress. She has no wheezes. She has no rales.  Abdominal: Soft. There is no tenderness.  Musculoskeletal: She exhibits no edema.  Lymphadenopathy:    She has no cervical adenopathy.  Neurological: She is alert. Coordination normal.  Skin: Skin is warm and dry. No rash noted. She is not diaphoretic. No erythema. No pallor.  Psychiatric: Her mood appears anxious. Her speech is tangential. She is hyperactive. Thought content is paranoid. She expresses no homicidal and no suicidal ideation.  Patient is very talkative. She is paranoid about a gentleman who is swinging night and a hammer. She reports he is cannibal. She is tangential. She denies SI or HI. She has good eye contact and is cooperative. She is not disheveled.   Nursing note and vitals reviewed.   ED Course  Procedures (including critical care time) Labs Review Labs Reviewed  COMPREHENSIVE METABOLIC PANEL - Abnormal; Notable for the following:    Glucose, Bld 123 (*)    Total Protein 8.2 (*)    Total Bilirubin 0.2 (*)    All other components within normal limits  URINALYSIS, ROUTINE W REFLEX MICROSCOPIC (NOT AT [facility]) - Abnormal; Notable for  the following:    Color, Urine RED (*)    APPearance CLOUDY (*)    Glucose, UA 250 (*)    Hgb urine dipstick LARGE (*)    Ketones, ur 15 (*)    Protein, ur >300 (*)    Nitrite POSITIVE (*)    Leukocytes, UA MODERATE (*)    All other components within normal limits  ACETAMINOPHEN LEVEL - Abnormal; Notable for the following:    Acetaminophen (Tylenol), Serum <10 (*)    All other components within normal limits  URINE MICROSCOPIC-ADD ON - Abnormal; Notable for the following:    Squamous Epithelial / LPF 0-5 (*)    Bacteria, UA MANY (*)    All other components within normal limits  URINE CULTURE  CBC  URINE RAPID DRUG SCREEN, HOSP PERFORMED  ETHANOL  PREGNANCY, URINE  SALICYLATE LEVEL    Imaging Review No results found. I have personally reviewed and evaluated these lab results as part of my medical decision-making.  EKG Interpretation None      Filed Vitals:   01/27/16 0928 01/27/16 1430  BP: 161/100 150/83  Pulse: 92 80  Temp: 98 F (36.7 C) 97.4 F (36.3 C)  TempSrc: Oral Oral  Resp: 18 18  SpO2: 100% 99%     MDM   Meds given in ED:  Medications  cephALEXin (KEFLEX) capsule 500 mg (not administered)  alum & mag hydroxide-simeth (MAALOX/MYLANTA) 200-200-20 MG/5ML suspension 30 mL (not administered)  ondansetron (ZOFRAN) tablet 4 mg (not administered)  acetaminophen (TYLENOL) tablet 650 mg (not administered)  LORazepam (ATIVAN) tablet 1 mg (not administered)  atorvastatin (LIPITOR) tablet 10 mg (not administered)  benztropine (COGENTIN) tablet 0.5 mg (not administered)  divalproex (DEPAKOTE) DR tablet 1,000 mg (not administered)  gabapentin (NEURONTIN) capsule 400 mg (not administered)  hydrOXYzine (ATARAX/VISTARIL) tablet 25 mg (not administered)  lactulose (CHRONULAC) 10 GM/15ML solution 20 g (not administered)  lisinopril (PRINIVIL,ZESTRIL) tablet 10 mg (not administered)  metFORMIN (GLUCOPHAGE) tablet 1,000 mg (not administered)  QUEtiapine (SEROQUEL  XR) 24 hr tablet 800 mg (not administered)  risperiDONE (RISPERDAL) tablet 6 mg (not administered)    New Prescriptions   No medications on file    Final diagnoses:  Schizophrenia, unspecified type (HCC)  UTI (lower urinary tract infection)   Patient presents to the emergency department with acute psychosis. She is a history of schizophrenia. She has auditory and visual hallucinations. She is paranoid. Workup here reveals a urinary tract infection. Urine sent for culture. Patient started on Keflex 4 times a day for 10 days. She will need to continue Keflex when transferred from this facility. Plan is for inpatient admission. Patient is awaiting placement.     Waynetta Pean, PA-C 01/27/16 1519  Isla Pence, MD 01/27/16 1538

## 2016-01-27 NOTE — ED Notes (Signed)
Lab at bedside

## 2016-01-27 NOTE — ED Notes (Signed)
Telepsych in progress. 

## 2016-01-27 NOTE — ED Notes (Signed)
Attempted IV acces x3 by Two different ER nurses. Pt refused fourth attempt. EDP aware and reported would switch antibiotic to PO.

## 2016-01-27 NOTE — Progress Notes (Addendum)
Spoke with Scientist, physiological at [employer] [phone]. Pt has resided there since 06/2015, prior to that was living with sister Sudie Grumbling, legal guardian [phone]). States pt has apparently had several group home placements in the past as well. Notes pt receives ACTT services through [facility]. States, "This behavior has been escalating. Typically she becomes incited over cigarettes and when allowed to have one, she de-escalates." States pt is compliant with medications and has not hx of aggression in the group home. Reports that pt has not been hospitalized since living with them but note they understood she does have a hx of inpatient psychiatric treatment, unknown dates/locations. States documented dx are schizophrenia, MDD, and diabetes. [facility] states they are faxing copies of custody paperwork to Probation officer.  Attempted contacting pt's sister/legal guardian Sudie Grumbling [phone]. Voicemail identifies recipient is "[facility]." Left voicemail requesting returned call.   Called [facility] [phone] ex [phone] and spoke with Danae Chen. She states she brought pt to ED after pt came into office for appt this morning appearing psychotic- delusions that "there are cannibals at the group home, she needs to contact Obama so he can intervene for her." States pt has been decompensating for the past 2 weeks, states medications were changed about 1 week ago but pt has not shown response, and pt seemed "even worse this morning, I wanted her to come in for safety bc she seems to be decompensating even further." States pt has been having relational conflict with another resident in the group home, but no aggressive history. States this person is whom she is referring to "swinging a hammer" but that this did not transpire and seems to be delusion on pt's part. States pt has not had an inpatient psychiatric admission in several years, "is typically stable outpatient." States, "Even at her baseline she  expresses delusions, but this is not a typical delusional theme for her, and typically she is easily redirected from her consistent delusions, but not this time." Erica requests ACTT be contacted when pt transferred to inpatient facility in order to provide continuity of care (use number above or [phone] if urgent).  Danae Chen states she spoke with pt's guardian this morning to let her know they were bringing pt to ED. Pt arrived voluntarily.  All involved are aware that Pt has been assessed and recommendation is that pt receive inpatient treatment due to active psychotic symptoms. All are in agreement with plan.   Sharren Bridge, MSW, LCSW Clinical Social Work, Disposition  01/27/2016 [phone]  Received faxed copy of guardianship paperwork. Copy retained for pt's chart. Marland Kitchen

## 2016-01-27 NOTE — ED Notes (Signed)
Per TTS, seeking inpatient placement. Pt aware and will be changed in to scrubs upon finishing lunch tray.

## 2016-01-28 LAB — CBG MONITORING, ED
GLUCOSE-CAPILLARY: 176 mg/dL — AB (ref 65–99)
Glucose-Capillary: 192 mg/dL — ABNORMAL HIGH (ref 65–99)
Glucose-Capillary: 249 mg/dL — ABNORMAL HIGH (ref 65–99)
Glucose-Capillary: 338 mg/dL — ABNORMAL HIGH (ref 65–99)

## 2016-01-28 MED ORDER — ZIPRASIDONE MESYLATE 20 MG IM SOLR
10.0000 mg | Freq: Once | INTRAMUSCULAR | Status: DC
  Filled 2016-01-28: qty 20

## 2016-01-28 MED ORDER — INSULIN ASPART 100 UNIT/ML ~~LOC~~ SOLN
0.0000 [IU] | Freq: Three times a day (TID) | SUBCUTANEOUS | Status: DC
  Administered 2016-01-28: 2 [IU] via SUBCUTANEOUS
  Administered 2016-01-28: 5 [IU] via SUBCUTANEOUS
  Administered 2016-01-29: 2 [IU] via SUBCUTANEOUS
  Administered 2016-01-29: 3 [IU] via SUBCUTANEOUS
  Filled 2016-01-28 (×4): qty 1

## 2016-01-28 NOTE — ED Notes (Signed)
Pt called this nurse to room and states, she wanted to go back to her group home and "didn't want to waste tax payers money by taking up a bed". Pt ranting on and on about this, repeating the same things.  Dr Sabra Heck made aware.

## 2016-01-28 NOTE — ED Notes (Addendum)
MD at bedside. (Dr. Miller) 

## 2016-01-28 NOTE — ED Notes (Signed)
Pt cooperated and took night medications, however, the entire time I was in room she was ranting about wanting to leave and how her Dad is a cop in [place] and she was going to let Medicare and Medicaid services know that we are wasting a hospital bed.

## 2016-01-28 NOTE — ED Notes (Signed)
Pt stating she wants to leave and go back home. Explained we were waiting for inpatient placement. Pt insistent that she is going to leave and requests to speak to doctor. Dr. Sabra Heck notified

## 2016-01-28 NOTE — Progress Notes (Signed)
Seeking inpatient psychiatric treatment for pt. Considered for admission to [facility] as well, however no beds available at present.  Referred to: [facility]- per [facility]- per [facility]

## 2016-01-28 NOTE — ED Notes (Signed)
Pt bathing off in room

## 2016-01-28 NOTE — ED Notes (Signed)
Dr Sabra Heck gave verbal order for Geodon and to initiate IVC papers.
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
## 2016-01-29 LAB — CBG MONITORING, ED
GLUCOSE-CAPILLARY: 205 mg/dL — AB (ref 65–99)
Glucose-Capillary: 184 mg/dL — ABNORMAL HIGH (ref 65–99)

## 2016-01-29 LAB — URINE CULTURE

## 2016-01-29 MED ORDER — CEPHALEXIN 500 MG PO CAPS: ORAL_CAPSULE | ORAL | Status: DC

## 2016-01-29 MED ORDER — ZIPRASIDONE MESYLATE 20 MG IM SOLR: 10.0000 mg | Freq: Once | INTRAMUSCULAR | Status: DC

## 2016-01-29 NOTE — ED Provider Notes (Signed)
Call from pharmacy at [phone]. Patient was prescribed Keflex yesterday and is allergic to penicillin. Will change Rx to Macrobid twice a day for 5 days for UTI.  Nat Christen, MD 01/29/16 [phone]

## 2016-01-29 NOTE — BHH Counselor (Signed)
Writer called and spoke w/ pt's [facility] member [place] office [phone] or [phone] Erika's cell. Writer requested that Doroteo Bradford come to see pt to assess whether pt is back to her baseline behavior. Doroteo Bradford says she will be able to get to [facility] around noon. She will call writer back with update. Doroteo Bradford reports that pt's baseline is somewhat delusional, but pt's irritability, agitation, and paranoia are atypical behavior for pt.   Arnold Long, [place] Therapeutic Triage Specialist

## 2016-01-29 NOTE — ED Notes (Signed)
Instructed pt to take all of antibiotics as prescribed. 

## 2016-01-29 NOTE — ED Notes (Signed)
Pt come out of room asking to call [facility] because she is ready to go home. Informed pt that [facility] will call with her treatment plan.
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
## 2016-01-29 NOTE — ED Notes (Signed)
Pt asked this nurse to come in to speak with.  Pt requesting help with getting a wig.  Explained to pt that we don't do that and she stated the [facility] does.

## 2016-01-29 NOTE — ED Provider Notes (Signed)
Discussed patient with [place] [facility] has evaluated this morning and feels like the patient is back at her baseline. Her affect seems also evaluated her and feels that she is at baseline. I feel like she still has some psychosis and some anger however is not likely to be a danger to herself or others at this time. She has good follow-up as an outpatient.  Discharge at this time to care of family.   Merrily Pew, MD 01/29/16 [phone]

## 2016-09-15 ENCOUNTER — Emergency Department (HOSPITAL_COMMUNITY)
Admission: EM | Admit: 2016-09-15 | Discharge: 2016-09-16 | Disposition: A | Discharge status: Home / Self Care | Payer: Medicaid Other | Attending: Emergency Medicine | Admitting: Emergency Medicine

## 2016-09-15 ENCOUNTER — Encounter (HOSPITAL_COMMUNITY): Payer: Self-pay | Admitting: Emergency Medicine

## 2016-09-15 DIAGNOSIS — I10 Essential (primary) hypertension: Secondary | ICD-10-CM | POA: Diagnosis not present

## 2016-09-15 DIAGNOSIS — Z794 Long term (current) use of insulin: Secondary | ICD-10-CM | POA: Diagnosis not present

## 2016-09-15 DIAGNOSIS — F17210 Nicotine dependence, cigarettes, uncomplicated: Secondary | ICD-10-CM | POA: Diagnosis not present

## 2016-09-15 DIAGNOSIS — E119 Type 2 diabetes mellitus without complications: Secondary | ICD-10-CM | POA: Diagnosis not present

## 2016-09-15 DIAGNOSIS — Z79899 Other long term (current) drug therapy: Secondary | ICD-10-CM | POA: Insufficient documentation

## 2016-09-15 DIAGNOSIS — F200 Paranoid schizophrenia: Secondary | ICD-10-CM

## 2016-09-15 DIAGNOSIS — R443 Hallucinations, unspecified: Secondary | ICD-10-CM | POA: Diagnosis present

## 2016-09-15 LAB — COMPREHENSIVE METABOLIC PANEL
ALK PHOS: 94 U/L (ref 38–126)
ALT: 28 U/L (ref 14–54)
ANION GAP: 9 (ref 5–15)
AST: 28 U/L (ref 15–41)
Albumin: 4.1 g/dL (ref 3.5–5.0)
BUN: 14 mg/dL (ref 6–20)
CALCIUM: 9.6 mg/dL (ref 8.9–10.3)
CO2: 28 mmol/L (ref 22–32)
Chloride: 99 mmol/L — ABNORMAL LOW (ref 101–111)
Creatinine, Ser: 0.63 mg/dL (ref 0.44–1.00)
Glucose, Bld: 201 mg/dL — ABNORMAL HIGH (ref 65–99)
Potassium: 4 mmol/L (ref 3.5–5.1)
SODIUM: 136 mmol/L (ref 135–145)
Total Bilirubin: 0.6 mg/dL (ref 0.3–1.2)
Total Protein: 8 g/dL (ref 6.5–8.1)

## 2016-09-15 LAB — CBC WITH DIFFERENTIAL/PLATELET
Basophils Absolute: 0 10*3/uL (ref 0.0–0.1)
Basophils Relative: 0 %
EOS ABS: 0.3 10*3/uL (ref 0.0–0.7)
EOS PCT: 3 %
HCT: 36.2 % (ref 36.0–46.0)
HEMOGLOBIN: 12.1 g/dL (ref 12.0–15.0)
LYMPHS PCT: 32 %
Lymphs Abs: 2.4 10*3/uL (ref 0.7–4.0)
MCH: 30.8 pg (ref 26.0–34.0)
MCHC: 33.4 g/dL (ref 30.0–36.0)
MCV: 92.1 fL (ref 78.0–100.0)
MONOS PCT: 12 %
Monocytes Absolute: 0.9 10*3/uL (ref 0.1–1.0)
Neutro Abs: 4 10*3/uL (ref 1.7–7.7)
Neutrophils Relative %: 53 %
PLATELETS: 268 10*3/uL (ref 150–400)
RBC: 3.93 MIL/uL (ref 3.87–5.11)
RDW: 13.1 % (ref 11.5–15.5)
WBC: 7.5 10*3/uL (ref 4.0–10.5)

## 2016-09-15 LAB — ETHANOL

## 2016-09-15 MED ORDER — LACTULOSE 10 GM/15ML PO SOLN
20.0000 g | Freq: Two times a day (BID) | ORAL | Status: DC
  Administered 2016-09-16: 20 g via ORAL
  Filled 2016-09-15: qty 30

## 2016-09-15 MED ORDER — METFORMIN HCL 500 MG PO TABS
1000.0000 mg | ORAL_TABLET | Freq: Two times a day (BID) | ORAL | Status: DC
  Administered 2016-09-16: 1000 mg via ORAL
  Filled 2016-09-15: qty 2

## 2016-09-15 MED ORDER — INSULIN ASPART 100 UNIT/ML ~~LOC~~ SOLN
11.0000 [IU] | Freq: Three times a day (TID) | SUBCUTANEOUS | Status: DC
  Filled 2016-09-15: qty 1

## 2016-09-15 MED ORDER — DIMENHYDRINATE 50 MG PO TABS
50.0000 mg | ORAL_TABLET | ORAL | Status: DC | PRN
  Filled 2016-09-15: qty 1

## 2016-09-15 MED ORDER — ATORVASTATIN CALCIUM 10 MG PO TABS
10.0000 mg | ORAL_TABLET | Freq: Every day | ORAL | Status: DC
  Administered 2016-09-16: 10 mg via ORAL
  Filled 2016-09-15 (×4): qty 1

## 2016-09-15 MED ORDER — COLCHICINE 0.6 MG PO TABS: 0.6000 mg | ORAL_TABLET | Freq: Every day | ORAL | Status: DC | PRN

## 2016-09-15 MED ORDER — POTASSIUM CHLORIDE CRYS ER 20 MEQ PO TBCR
20.0000 meq | EXTENDED_RELEASE_TABLET | Freq: Every day | ORAL | Status: DC
  Administered 2016-09-16: 20 meq via ORAL
  Filled 2016-09-15: qty 1

## 2016-09-15 MED ORDER — RISPERIDONE 2 MG PO TBDP
6.0000 mg | ORAL_TABLET | Freq: Every day | ORAL | Status: DC
  Filled 2016-09-15 (×3): qty 3

## 2016-09-15 MED ORDER — FUROSEMIDE 40 MG PO TABS
40.0000 mg | ORAL_TABLET | Freq: Every day | ORAL | Status: DC
  Administered 2016-09-16: 40 mg via ORAL
  Filled 2016-09-15: qty 1

## 2016-09-15 MED ORDER — ACETAMINOPHEN 325 MG PO TABS: 650.0000 mg | ORAL_TABLET | ORAL | Status: DC | PRN

## 2016-09-15 MED ORDER — LISINOPRIL 10 MG PO TABS
10.0000 mg | ORAL_TABLET | Freq: Every day | ORAL | Status: DC
  Administered 2016-09-16: 10 mg via ORAL
  Filled 2016-09-15: qty 1

## 2016-09-15 MED ORDER — BENZTROPINE MESYLATE 1 MG PO TABS
0.5000 mg | ORAL_TABLET | Freq: Two times a day (BID) | ORAL | Status: DC
  Administered 2016-09-16: 0.5 mg via ORAL
  Filled 2016-09-15: qty 1

## 2016-09-15 MED ORDER — GABAPENTIN 400 MG PO CAPS
400.0000 mg | ORAL_CAPSULE | Freq: Four times a day (QID) | ORAL | Status: DC
  Administered 2016-09-16: 400 mg via ORAL
  Filled 2016-09-15: qty 1

## 2016-09-15 MED ORDER — QUETIAPINE FUMARATE ER 300 MG PO TB24
600.0000 mg | ORAL_TABLET | Freq: Every day | ORAL | Status: DC
  Filled 2016-09-15 (×3): qty 2

## 2016-09-15 MED ORDER — TRAMADOL-ACETAMINOPHEN 37.5-325 MG PO TABS
1.0000 | ORAL_TABLET | Freq: Two times a day (BID) | ORAL | Status: DC
  Administered 2016-09-16: 1 via ORAL
  Filled 2016-09-15: qty 1
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
## 2016-09-15 NOTE — ED Notes (Signed)
ACT team crisis [phone], group home - [facility] [phone] Please contact them with plan

## 2016-09-15 NOTE — ED Notes (Signed)
Pt informed of placement. No needs voiced. Lights turned down. Empty dinner tray removed from room.

## 2016-09-15 NOTE — ED Triage Notes (Signed)
Pt from [facility] and does not want to continue staying there due to everyone being "homosexual."  Pt is bib Regina Watson with ACT team, has legal gardian Regina Watson, sister).  Pt has hx schizophrenia.  Regina Watson reports that pt has been very delusional, refusing to take depakote due to hair loss.  Denies SI/HI.  Pt reports hearing voices last night, denies visual hallucinations.

## 2016-09-15 NOTE — ED Notes (Signed)
Pt provided a meal tray

## 2016-09-15 NOTE — ED Notes (Signed)
Provided patient with diet ginger ale.

## 2016-09-15 NOTE — BH Assessment (Signed)
Under review: Regina Watson, [name], [name], [name]

## 2016-09-15 NOTE — ED Notes (Signed)
Lab and Act team member at the bedside, pt laying in bed with eyes closed

## 2016-09-15 NOTE — ED Provider Notes (Addendum)
[facility] DEPT Provider Note   CSN: [id number] Arrival date & time: 09/15/16  1347     History   Chief Complaint Chief Complaint  Patient presents with  . Hallucinations    HPI Regina Watson is a 51 y.o. female.  Patient states she has been hearing voices. She has not been sleeping. And she does not like the home she is at because she states that everyone there is a homosexual    Altered Mental Status   This is a recurrent problem. The current episode started more than 2 days ago. The problem has not changed since onset.Associated symptoms include confusion and hallucinations. Pertinent negatives include no seizures. Risk factors: History of schizophrenic. Her past medical history does not include seizures.    Past Medical History:  Diagnosis Date  . Anemia   . Diabetes mellitus without complication ([place])   . Hypertension   . Schizophrenia ([place])     There are no active problems to display for this patient.   Past Surgical History:  Procedure Laterality Date  . TOE AMPUTATION      OB History    No data available       Home Medications    Prior to Admission medications   Medication Sig Start Date End Date Taking? Authorizing Provider  atorvastatin (LIPITOR) 10 MG tablet Take 10 mg by mouth daily.    Historical Provider, MD  benztropine (COGENTIN) 0.5 MG tablet Take 0.5 mg by mouth 2 (two) times daily.    Historical Provider, MD  cephALEXin (KEFLEX) 500 MG capsule 2 caps po bid x 7 days 01/29/16   Merrily Pew, MD  colchicine 0.6 MG tablet Take 0.6 mg by mouth daily.    Historical Provider, MD  divalproex (DEPAKOTE) 500 MG DR tablet Take 1,000 mg by mouth at bedtime.     Historical Provider, MD  gabapentin (NEURONTIN) 400 MG capsule Take 400 mg by mouth 4 (four) times daily.     Historical Provider, MD  hydrOXYzine (ATARAX/VISTARIL) 25 MG tablet Take 25 mg by mouth 3 (three) times daily.    Historical Provider, MD  lactulose (CHRONULAC) 10 GM/15ML  solution Take 20 g by mouth 2 (two) times daily.    Historical Provider, MD  lisinopril (PRINIVIL,ZESTRIL) 10 MG tablet Take 10 mg by mouth daily.    Historical Provider, MD  metFORMIN (GLUCOPHAGE) 1000 MG tablet Take 1,000 mg by mouth 2 (two) times daily with a meal.    Historical Provider, MD  QUEtiapine (SEROQUEL XR) 400 MG 24 hr tablet Take 800 mg by mouth at bedtime.    Historical Provider, MD  risperiDONE (RISPERDAL) 3 MG tablet Take 6 mg by mouth at bedtime.     Historical Provider, MD  salicylic acid-lactic acid 17 % external solution Apply 1 application topically daily. Apply to callus on great toe.    Historical Provider, MD  traMADol-acetaminophen (ULTRACET) 37.5-325 MG tablet Take 1 tablet by mouth 2 (two) times daily.    Historical Provider, MD  triamcinolone cream (KENALOG) 0.1 % Apply 1 application topically 2 (two) times daily.    Historical Provider, MD    Family History History reviewed. No pertinent family history.  Social History Social History  Substance Use Topics  . Smoking status: Current Every Day Smoker    Packs/day: 1.00    Types: Cigarettes  . Smokeless tobacco: Not on file  . Alcohol use No     Allergies   Bactrim [sulfamethoxazole-trimethoprim] and Penicillins   Review of Systems  Review of Systems  Constitutional: Negative for appetite change and fatigue.  HENT: Negative for congestion, ear discharge and sinus pressure.   Eyes: Negative for discharge.  Respiratory: Negative for cough.   Cardiovascular: Negative for chest pain.  Gastrointestinal: Negative for abdominal pain and diarrhea.  Genitourinary: Negative for frequency and hematuria.  Musculoskeletal: Negative for back pain.  Skin: Negative for rash.  Neurological: Negative for seizures and headaches.  Psychiatric/Behavioral: Positive for confusion and hallucinations.     Physical Exam Updated Vital Signs BP 132/81 (BP Location: Left Arm)   Pulse 113   Temp 98 F (36.7 C) (Oral)    Resp 20   Ht 5\' 9"  (1.753 m)   Wt 217 lb (98.4 kg)   LMP 08/21/2016 (Approximate)   SpO2 100%   BMI 32.05 kg/m   Physical Exam  Constitutional: She is oriented to person, place, and time. She appears well-developed.  HENT:  Head: Normocephalic.  Eyes: Conjunctivae and EOM are normal. No scleral icterus.  Neck: Neck supple. No thyromegaly present.  Cardiovascular: Normal rate and regular rhythm.  Exam reveals no gallop and no friction rub.   No murmur heard. Pulmonary/Chest: No stridor. She has no wheezes. She has no rales. She exhibits no tenderness.  Abdominal: She exhibits no distension. There is no tenderness. There is no rebound.  Musculoskeletal: Normal range of motion. She exhibits no edema.  Lymphadenopathy:    She has no cervical adenopathy.  Neurological: She is oriented to person, place, and time. She exhibits normal muscle tone. Coordination normal.  Skin: No rash noted. No erythema.  Psychiatric:  Patient having auditory hallucinations. Not suicidal or homicidal     ED Treatments / Results  Labs (all labs ordered are listed, but only abnormal results are displayed) Labs Reviewed  CBC WITH DIFFERENTIAL/PLATELET  COMPREHENSIVE METABOLIC PANEL  ETHANOL  RAPID URINE DRUG SCREEN, HOSP PERFORMED    EKG  EKG Interpretation None       Radiology No results found.  Procedures Procedures (including critical care time)  Medications Ordered in ED Medications - No data to display   Initial Impression / Assessment and Plan / ED Course  I have reviewed the triage vital signs and the nursing notes.  Pertinent labs & imaging results that were available during my care of the patient were reviewed by me and considered in my medical decision making (see chart for details).     Patient with schizophrenia. Patient will be seen by psychiatry.   Patient awaiting bed placement for psych  Final Clinical Impressions(s) / ED Diagnoses   Final diagnoses:  None     New Prescriptions New Prescriptions   No medications on file     Milton Ferguson, MD 09/15/16 [place], MD 09/15/16 [phone]

## 2016-09-16 LAB — RAPID URINE DRUG SCREEN, HOSP PERFORMED
Amphetamines: NOT DETECTED
Barbiturates: NOT DETECTED
Benzodiazepines: NOT DETECTED
Cocaine: NOT DETECTED
OPIATES: NOT DETECTED
Tetrahydrocannabinol: NOT DETECTED

## 2016-09-16 LAB — CBG MONITORING, ED
GLUCOSE-CAPILLARY: 196 mg/dL — AB (ref 65–99)
Glucose-Capillary: 199 mg/dL — ABNORMAL HIGH (ref 65–99)

## 2016-09-16 MED ORDER — RISPERIDONE 1 MG PO TABS
6.0000 mg | ORAL_TABLET | Freq: Every day | ORAL | Status: DC
  Filled 2016-09-16 (×3): qty 2

## 2016-09-16 NOTE — ED Notes (Signed)
Pt given meal, is not hungry at this time.

## 2016-09-16 NOTE — Discharge Instructions (Signed)
You have been cleared by behavioral health to return to your residence

## 2016-09-16 NOTE — ED Notes (Addendum)
ACT team member Jonathanspoke to pt and is still refusing to go back to [facility].  He reports that another ACT team member is going to her sister theresa's house ([place]) to speak to her about her care.  Will call to [place] update.

## 2016-09-16 NOTE — ED Notes (Signed)
Pt given meal tray.

## 2016-09-16 NOTE — ED Notes (Signed)
Spoke with pt's ACT team who will speak with pt regarding her placement.

## 2016-09-16 NOTE — ED Notes (Signed)
ACT team member at bedside.

## 2016-09-16 NOTE — ED Notes (Signed)
Ava from [facility] called to report that after tts, pt has been dispositioned as a discharge back to [facility].  She has already called [facility] to update them on plan of care and to arrange transportation.  Ava also notified me that she would be calling Dr. Lacinda Axon to notify him about discharging pt.

## 2016-09-16 NOTE — BH Assessment (Signed)
Re-Assessment   Patient lives in a [place] and does not want to return to the group home.  Patient denies SI/HI/Psychosis/Substance Abuse.  Patient has a legal guardian.  Patient has lived in this group home for over a year.  Group Home reports that she has an Agricultural consultant with [employer].    Patient denies any abuse at this group home.   Patient reports that she is just not wanting to return to the group home. The group home will be coming to pick up the patient.   Writer informed the ER MD (Dr.  Lacinda Axon)  And  ER RN Jinny Blossom) of the disposition.

## 2016-09-16 NOTE — ED Notes (Signed)
[employer] from [facility] group home here to pick up pt, pt is now refusing to go back to group home.  Dr. Lacinda Axon notified and placed order for another TTS.

## 2016-09-16 NOTE — BH Assessment (Signed)
Writer informed Dr. Lacinda Axon that the patient has been re-assessed and is psychiatrically cleared.

## 2016-09-16 NOTE — ED Notes (Signed)
According to Act team member jonathan who just spoke to another ACT team member, Regina Watson will be arrested for trespassing due to care being complete at this time.  Regina Watson will be escorted by [employer] back to [facility].  Regina Watson explained what was going to happen.  [employer] called.

## 2016-09-16 NOTE — ED Provider Notes (Signed)
Consults obtained from [employer] and social work. Behavioral Health confirms the patient does not meet inpatient criteria. Social work stated that patient cannot be reassigned to another group home. Will discharge patient back to facility.   Nat Christen, MD 09/16/16 [phone]

## 2016-09-16 NOTE — ED Notes (Signed)
Pt refusing discharge back to group home.  Dr. Lacinda Axon aware and consulted SW.  Spoke with Nathaniel Man, Surveyor, quantity of SW and was told he could not find her other placement from the ER because she already has a safe place to go.  Pt refused to go back to group home when group home representative arrived.  Spoke to [employer], Librarian, academic in charge at [employer] and was told they could not force her to come with them and she gave several numbers to pt's act team.  Spoke with Anabel Halon, pt's case manager with Act team and she is currently out of the county and she will call someone in the county to get back in touch with [place].

## 2016-09-16 NOTE — Progress Notes (Signed)
Spoke with owner of pt's [facility] [phone]. She states, "I got a call from my group home director saying TTS staff had called and given her the option of not taking [name] back to live here. We know she doesn't want to live her but of course we are taking her back. Her sister is her guardian and even though Regina Watson wants to move, her sister knows that and it is not an option right now. When my director heard that she thought she was being given the option to have her move somewhere else when she was d/c'd from the hospital, so when she said that, the staff stated they would be calling the state. We are not abandoning her at the ED, we are taking her home. We don't feel that should've been communicated that way."   CSW informed her that her concern will be shared with TTS director.   Group home states they are coming to pick pt up. Notified [facility] of conversation.  Guardian: [name] [id number]  Regina Watson, MSW, LCSW Clinical Social Work, Disposition  09/16/2016 [phone]

## 2016-09-16 NOTE — ED Notes (Signed)
Security at bedside. Pt reports she wants to go out to smoke, told she cannot leave at this time.

## 2016-09-16 NOTE — ED Notes (Signed)
Pt leaving with police at this time, police spoke with group home ([facility]) and have agreed to plan.

## 2016-09-16 NOTE — ED Provider Notes (Signed)
No suicidal or homicidal ideation. Patient has been cleared by behavioral health to return to her group home   Nat Christen, MD 09/16/16 1145

## 2016-10-03 ENCOUNTER — Telehealth: Payer: Self-pay | Admitting: Student

## 2016-10-03 NOTE — Telephone Encounter (Signed)
Received records from [employer] for appointment on 10/04/16 with Bernerd Pho, [place].  Records put with Brittany's schedule for 10/04/16. lp
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
## 2016-10-04 ENCOUNTER — Encounter: Payer: Self-pay | Admitting: Student

## 2016-10-04 ENCOUNTER — Ambulatory Visit (INDEPENDENT_AMBULATORY_CARE_PROVIDER_SITE_OTHER): Payer: Medicaid Other | Admitting: Student

## 2016-10-04 VITALS — BP 136/80 | HR 95 | Ht 69.0 in | Wt 218.0 lb

## 2016-10-04 DIAGNOSIS — R002 Palpitations: Secondary | ICD-10-CM | POA: Diagnosis not present

## 2016-10-04 DIAGNOSIS — R600 Localized edema: Secondary | ICD-10-CM

## 2016-10-04 DIAGNOSIS — Z720 Tobacco use: Secondary | ICD-10-CM | POA: Diagnosis not present

## 2016-10-04 DIAGNOSIS — R000 Tachycardia, unspecified: Secondary | ICD-10-CM

## 2016-10-04 DIAGNOSIS — I10 Essential (primary) hypertension: Secondary | ICD-10-CM | POA: Diagnosis not present

## 2016-10-04 NOTE — Patient Instructions (Signed)
Medication Instructions:  Your physician recommends that you continue on your current medications as directed. Please refer to the Current Medication list given to you today.  Labwork: Your physician recommends that you return for lab work in: [place], [place]  Testing/Procedures: Your physician has requested that you have an echocardiogram. Echocardiography is a painless test that uses sound waves to create images of your heart. It provides your doctor with information about the size and shape of your heart and how well your heart's chambers and valves are working. This procedure takes approximately one hour. There are no restrictions for this procedure.  SCHEDULE IN [place] IN 1 WEEK  Follow-Up: Your physician recommends that you schedule a follow-up appointment in: [place] PROVIDER IN THAT OFFICE AFTER ECHO IS COMPLETE  Any Other Special Instructions Will Be Listed Below (If Applicable).  If you need a refill on your cardiac medications before your next appointment, please call your pharmacy.

## 2016-10-05 LAB — TSH: TSH: 0.68 mIU/L

## 2016-10-05 LAB — D-DIMER, QUANTITATIVE: D-Dimer, Quant: 0.35 mcg/mL FEU (ref <0.50)

## 2016-10-07 ENCOUNTER — Telehealth: Payer: Self-pay | Admitting: Student

## 2016-10-07 NOTE — Telephone Encounter (Signed)
Left message. Patient's echo is scheduled for 2.20.18 at 10:30 at [facility]

## 2016-10-11 ENCOUNTER — Ambulatory Visit (HOSPITAL_COMMUNITY)
Admission: RE | Admit: 2016-10-11 | Discharge: 2016-10-11 | Disposition: A | Discharge status: Home / Self Care | Payer: Medicaid Other | Source: Ambulatory Visit | Attending: Student | Admitting: Student

## 2016-10-11 DIAGNOSIS — I501 Left ventricular failure: Secondary | ICD-10-CM | POA: Insufficient documentation

## 2016-10-11 DIAGNOSIS — R002 Palpitations: Secondary | ICD-10-CM | POA: Diagnosis not present

## 2016-10-11 DIAGNOSIS — R9439 Abnormal result of other cardiovascular function study: Secondary | ICD-10-CM | POA: Diagnosis not present

## 2016-10-11 DIAGNOSIS — R600 Localized edema: Secondary | ICD-10-CM | POA: Diagnosis present

## 2016-10-11 NOTE — Progress Notes (Signed)
*  PRELIMINARY RESULTS* Echocardiogram 2D Echocardiogram has been performed.  Leavy Cella 10/11/2016, 11:04 AM

## 2016-12-22 ENCOUNTER — Encounter (INDEPENDENT_AMBULATORY_CARE_PROVIDER_SITE_OTHER): Payer: Medicaid Other | Admitting: Ophthalmology

## 2016-12-22 DIAGNOSIS — H2513 Age-related nuclear cataract, bilateral: Secondary | ICD-10-CM

## 2016-12-22 DIAGNOSIS — E103523 Type 1 diabetes mellitus with proliferative diabetic retinopathy with traction retinal detachment involving the macula, bilateral: Secondary | ICD-10-CM | POA: Diagnosis not present

## 2016-12-22 DIAGNOSIS — H4312 Vitreous hemorrhage, left eye: Secondary | ICD-10-CM | POA: Diagnosis not present

## 2016-12-22 DIAGNOSIS — E10311 Type 1 diabetes mellitus with unspecified diabetic retinopathy with macular edema: Secondary | ICD-10-CM | POA: Diagnosis not present

## 2016-12-22 DIAGNOSIS — I10 Essential (primary) hypertension: Secondary | ICD-10-CM

## 2016-12-22 DIAGNOSIS — H35033 Hypertensive retinopathy, bilateral: Secondary | ICD-10-CM

## 2017-03-02 ENCOUNTER — Encounter (HOSPITAL_COMMUNITY): Payer: Self-pay | Admitting: Emergency Medicine

## 2017-03-02 ENCOUNTER — Emergency Department (HOSPITAL_COMMUNITY)
Admission: EM | Admit: 2017-03-02 | Discharge: 2017-03-02 | Disposition: A | Discharge status: Home / Self Care | Payer: Medicaid Other | Attending: Emergency Medicine | Admitting: Emergency Medicine

## 2017-03-02 DIAGNOSIS — E119 Type 2 diabetes mellitus without complications: Secondary | ICD-10-CM | POA: Diagnosis not present

## 2017-03-02 DIAGNOSIS — Z794 Long term (current) use of insulin: Secondary | ICD-10-CM | POA: Insufficient documentation

## 2017-03-02 DIAGNOSIS — F17200 Nicotine dependence, unspecified, uncomplicated: Secondary | ICD-10-CM | POA: Diagnosis not present

## 2017-03-02 DIAGNOSIS — Z609 Problem related to social environment, unspecified: Secondary | ICD-10-CM | POA: Diagnosis not present

## 2017-03-02 DIAGNOSIS — I10 Essential (primary) hypertension: Secondary | ICD-10-CM | POA: Diagnosis not present

## 2017-03-02 DIAGNOSIS — F17210 Nicotine dependence, cigarettes, uncomplicated: Secondary | ICD-10-CM | POA: Diagnosis not present

## 2017-03-02 DIAGNOSIS — G3184 Mild cognitive impairment, so stated: Secondary | ICD-10-CM | POA: Insufficient documentation

## 2017-03-02 DIAGNOSIS — R4189 Other symptoms and signs involving cognitive functions and awareness: Secondary | ICD-10-CM

## 2017-03-02 DIAGNOSIS — Z7984 Long term (current) use of oral hypoglycemic drugs: Secondary | ICD-10-CM | POA: Insufficient documentation

## 2017-03-02 DIAGNOSIS — R45851 Suicidal ideations: Secondary | ICD-10-CM | POA: Diagnosis present

## 2017-03-02 HISTORY — DX: Polyneuropathy, unspecified: G62.9

## 2017-03-02 LAB — RAPID URINE DRUG SCREEN, HOSP PERFORMED
Amphetamines: NOT DETECTED
Barbiturates: NOT DETECTED
Benzodiazepines: NOT DETECTED
Cocaine: NOT DETECTED
Opiates: NOT DETECTED
Tetrahydrocannabinol: NOT DETECTED

## 2017-03-02 NOTE — Clinical Social Work Note (Addendum)
Patient denies current suicidal ideation.  Patient states that she does not want to go back to her group home due to her feeling that the owner was homosexual and wanted to be with her. Patient indicated that her sister was her legal guardian.  LCSW left a voicemail message for patient's sister.  She stated that she is willing to go back but if anyone tried any homosexual activity with her she was leaving. Patient indicated that she has been at the facility for nearly two years and that no one has done anything sexually inappropriate to her ever. She stated that she just feels that the owner is homosexual.  Patient to return to group home at discharge.  Attending encouraged to do TTS consult as patient's thought patterns are questionable at this time.   LCSW spoke with TTS to advise that attending is agreeable to do TTS consult and to look for TTS consult.      Regina Watson, Clydene Pugh, LCSW
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
## 2017-03-02 NOTE — ED Notes (Signed)
[employer] officer, [employer] ---in to see pt.  Recommending we do a TTS consult.

## 2017-03-02 NOTE — ED Notes (Signed)
Meal given

## 2017-03-02 NOTE — ED Notes (Signed)
Rescinding Paperwork done earlier by Dr. Wilson Singer.  Paperwork faxed to the [facility] around 1625.  Copy placed in the chart.

## 2017-03-02 NOTE — ED Notes (Signed)
Per Dr. Wilson Singer ,  We do not need a sitter.  Pt denies SI at this time.
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
## 2017-03-02 NOTE — ED Notes (Signed)
Regina Watson from [facility] called ED to see if any information was needed from him.

## 2017-03-02 NOTE — ED Notes (Signed)
Security has wanded pt in triage.  °

## 2017-03-02 NOTE — ED Notes (Signed)
Spoke with [facility] who states "Ms. Bethena Roys is on the way."

## 2017-03-02 NOTE — ED Notes (Signed)
Regina Watson at [facility] given report, states someone will be there to pick her up but "it will be a while."

## 2017-03-02 NOTE — ED Notes (Signed)
Regina Watson from [facility] called to recommend that pt has been discharge, he has spoken to Dr. Wilson Singer regarding discharge.

## 2017-03-02 NOTE — ED Notes (Signed)
Belongings given back to pt.
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
## 2019-07-25 ENCOUNTER — Encounter (HOSPITAL_COMMUNITY): Payer: Self-pay

## 2019-07-25 ENCOUNTER — Emergency Department (HOSPITAL_COMMUNITY)
Admission: EM | Admit: 2019-07-25 | Discharge: 2019-07-26 | Disposition: A | Discharge status: Home / Self Care | Payer: Medicaid Other | Attending: Emergency Medicine | Admitting: Emergency Medicine

## 2019-07-25 ENCOUNTER — Other Ambulatory Visit: Payer: Self-pay

## 2019-07-25 DIAGNOSIS — E119 Type 2 diabetes mellitus without complications: Secondary | ICD-10-CM | POA: Insufficient documentation

## 2019-07-25 DIAGNOSIS — Z79899 Other long term (current) drug therapy: Secondary | ICD-10-CM | POA: Insufficient documentation

## 2019-07-25 DIAGNOSIS — Z91040 Latex allergy status: Secondary | ICD-10-CM | POA: Insufficient documentation

## 2019-07-25 DIAGNOSIS — Z794 Long term (current) use of insulin: Secondary | ICD-10-CM | POA: Insufficient documentation

## 2019-07-25 DIAGNOSIS — H5789 Other specified disorders of eye and adnexa: Secondary | ICD-10-CM | POA: Diagnosis present

## 2019-07-25 DIAGNOSIS — F17210 Nicotine dependence, cigarettes, uncomplicated: Secondary | ICD-10-CM | POA: Insufficient documentation

## 2019-07-25 DIAGNOSIS — I10 Essential (primary) hypertension: Secondary | ICD-10-CM | POA: Insufficient documentation

## 2019-07-25 DIAGNOSIS — H269 Unspecified cataract: Secondary | ICD-10-CM | POA: Diagnosis not present

## 2019-07-25 DIAGNOSIS — Z609 Problem related to social environment, unspecified: Secondary | ICD-10-CM

## 2019-07-25 NOTE — ED Notes (Signed)
Pt given ice water.

## 2019-07-25 NOTE — ED Triage Notes (Signed)
Pt is from [facility] in [place].  Pt has been deemed legally blind for many years but pt states her "cataracts are bothering me"   Pt denies pain
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
## 2019-07-25 NOTE — ED Notes (Signed)
Called group home- [phone] -to come pick up patient due to discharge. Stated that they cannot get her due to no transportation .  Will see if EMS can come get her.

## 2019-07-25 NOTE — Discharge Instructions (Signed)
Please follow up with the eye doctor. 
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
## 2019-07-26 NOTE — ED Notes (Signed)
Group home didn't answer that pt was on the way. But they were aware at 2256 that pt was up for d/c and we would find transportation .

## 2019-07-26 NOTE — ED Notes (Signed)
EMS arrived and departed with pt to take them back to care home.

## 2019-09-27 ENCOUNTER — Other Ambulatory Visit: Payer: Self-pay

## 2019-09-27 ENCOUNTER — Emergency Department (HOSPITAL_COMMUNITY): Payer: Medicaid Other

## 2019-09-27 ENCOUNTER — Encounter (HOSPITAL_COMMUNITY): Payer: Self-pay

## 2019-09-27 ENCOUNTER — Emergency Department (HOSPITAL_COMMUNITY)
Admission: EM | Admit: 2019-09-27 | Discharge: 2019-09-27 | Disposition: A | Discharge status: Home / Self Care | Payer: Medicaid Other | Attending: Emergency Medicine | Admitting: Emergency Medicine

## 2019-09-27 DIAGNOSIS — F17210 Nicotine dependence, cigarettes, uncomplicated: Secondary | ICD-10-CM | POA: Insufficient documentation

## 2019-09-27 DIAGNOSIS — I872 Venous insufficiency (chronic) (peripheral): Secondary | ICD-10-CM | POA: Insufficient documentation

## 2019-09-27 DIAGNOSIS — Z79899 Other long term (current) drug therapy: Secondary | ICD-10-CM | POA: Diagnosis not present

## 2019-09-27 DIAGNOSIS — E1140 Type 2 diabetes mellitus with diabetic neuropathy, unspecified: Secondary | ICD-10-CM | POA: Insufficient documentation

## 2019-09-27 DIAGNOSIS — Z794 Long term (current) use of insulin: Secondary | ICD-10-CM | POA: Insufficient documentation

## 2019-09-27 DIAGNOSIS — I10 Essential (primary) hypertension: Secondary | ICD-10-CM | POA: Diagnosis not present

## 2019-09-27 DIAGNOSIS — R0602 Shortness of breath: Secondary | ICD-10-CM | POA: Diagnosis not present

## 2019-09-27 DIAGNOSIS — R2243 Localized swelling, mass and lump, lower limb, bilateral: Secondary | ICD-10-CM | POA: Diagnosis present

## 2019-09-27 HISTORY — DX: Major depressive disorder, single episode, unspecified: F32.9

## 2019-09-27 LAB — CBC WITH DIFFERENTIAL/PLATELET
Abs Immature Granulocytes: 0.03 10*3/uL (ref 0.00–0.07)
Basophils Absolute: 0 10*3/uL (ref 0.0–0.1)
Basophils Relative: 1 %
Eosinophils Absolute: 0.3 10*3/uL (ref 0.0–0.5)
Eosinophils Relative: 5 %
HCT: 38.6 % (ref 36.0–46.0)
Hemoglobin: 12.2 g/dL (ref 12.0–15.0)
Immature Granulocytes: 1 %
Lymphocytes Relative: 42 %
Lymphs Abs: 2.1 10*3/uL (ref 0.7–4.0)
MCH: 29.5 pg (ref 26.0–34.0)
MCHC: 31.6 g/dL (ref 30.0–36.0)
MCV: 93.2 fL (ref 80.0–100.0)
Monocytes Absolute: 0.4 10*3/uL (ref 0.1–1.0)
Monocytes Relative: 9 %
Neutro Abs: 2.2 10*3/uL (ref 1.7–7.7)
Neutrophils Relative %: 42 %
Platelets: 187 10*3/uL (ref 150–400)
RBC: 4.14 MIL/uL (ref 3.87–5.11)
RDW: 12 % (ref 11.5–15.5)
WBC: 5 10*3/uL (ref 4.0–10.5)
nRBC: 0 % (ref 0.0–0.2)

## 2019-09-27 LAB — URINALYSIS, ROUTINE W REFLEX MICROSCOPIC
Bilirubin Urine: NEGATIVE
Glucose, UA: NEGATIVE mg/dL
Hgb urine dipstick: NEGATIVE
Ketones, ur: NEGATIVE mg/dL
Nitrite: NEGATIVE
Protein, ur: NEGATIVE mg/dL
Specific Gravity, Urine: 1.011 (ref 1.005–1.030)
pH: 7 (ref 5.0–8.0)

## 2019-09-27 LAB — COMPREHENSIVE METABOLIC PANEL
ALT: 20 U/L (ref 0–44)
AST: 17 U/L (ref 15–41)
Albumin: 4 g/dL (ref 3.5–5.0)
Alkaline Phosphatase: 80 U/L (ref 38–126)
Anion gap: 12 (ref 5–15)
BUN: 17 mg/dL (ref 6–20)
CO2: 31 mmol/L (ref 22–32)
Calcium: 9.6 mg/dL (ref 8.9–10.3)
Chloride: 95 mmol/L — ABNORMAL LOW (ref 98–111)
Creatinine, Ser: 0.72 mg/dL (ref 0.44–1.00)
GFR calc Af Amer: >60 mL/min (ref >60)
GFR calc non Af Amer: >60 mL/min (ref >60)
Glucose, Bld: 163 mg/dL — ABNORMAL HIGH (ref 70–99)
Potassium: 3.5 mmol/L (ref 3.5–5.1)
Sodium: 138 mmol/L (ref 135–145)
Total Bilirubin: 0.4 mg/dL (ref 0.3–1.2)
Total Protein: 8.1 g/dL (ref 6.5–8.1)

## 2019-09-27 LAB — BRAIN NATRIURETIC PEPTIDE: B Natriuretic Peptide: 29 pg/mL (ref 0.0–100.0)

## 2019-09-27 LAB — PREGNANCY, URINE: Preg Test, Ur: NEGATIVE

## 2019-09-27 NOTE — ED Notes (Signed)
Attempted several times without success to obtain IV site.  Charge Nurse to attempt.

## 2019-09-27 NOTE — ED Provider Notes (Signed)
[facility] EMERGENCY DEPARTMENT Provider Note   CSN: [id number] Arrival date & time: 09/27/19  1524     History Chief Complaint  Patient presents with  . Leg Swelling    Regina Watson is a 54 y.o. female with PMH significant for IDDM, HTN, HLD, diabetic neuropathy, MDD, and schizophrenia presents to the ED from [facility] by her NP for evaluation of 2-week history bilateral lower extremity swelling and weeping.  Patient is unsure of any weight change during this duration of time.  Patient endorses feeling short of breath "occasionally" and has been sleeping in a chair during this duration.  She reports that she is sleeping in a chair in order to elevate her legs rather than for reasons of orthopnea.  She denies any chest pain, headache or dizziness, abdominal discomfort, nausea or vomiting, change in appetite, change in bowel habits, increased urinary frequency, or other urinary symptoms.  When asked, patient does report that she was prescribed compression stockings in the past, however refuses to wear them due to the difficulty putting them on.  Patient is a very poor historian.   HPI     Past Medical History:  Diagnosis Date  . Anemia   . Diabetes mellitus without complication ([place])   . Hypertension   . MDD (major depressive disorder)   . Neuropathy 2010  . Schizophrenia [facility])     Patient Active Problem List   Diagnosis Date Noted  . Palpitations 10/04/2016  . Essential hypertension 10/04/2016  . Tobacco use 10/04/2016  . Tachycardia 10/04/2016    Past Surgical History:  Procedure Laterality Date  . TOE AMPUTATION       OB History   No obstetric history on file.     Family History  Problem Relation Age of Onset  . Hypertension Mother   . Cancer Mother   . Diabetes Father     Social History   Tobacco Use  . Smoking status: Current Every Day Smoker    Packs/day: 1.00    Years: 20.00    Pack years: 20.00    Types: Cigarettes  . Smokeless  tobacco: Never Used  Substance Use Topics  . Alcohol use: No  . Drug use: No    Home Medications Prior to Admission medications   Medication Sig Start Date End Date Taking? Authorizing Provider  atorvastatin (LIPITOR) 10 MG tablet Take 10 mg by mouth daily.    [provider]  benztropine (COGENTIN) 0.5 MG tablet Take 0.5 mg by mouth 2 (two) times daily.    [provider]  colchicine 0.6 MG tablet Take 0.6 mg by mouth daily as needed (for gout).     [provider]  furosemide (LASIX) 40 MG tablet Take 40 mg by mouth daily.    [provider]  gabapentin (NEURONTIN) 400 MG capsule Take 400 mg by mouth 4 (four) times daily.     [provider]  hydrOXYzine (ATARAX/VISTARIL) 25 MG tablet Take 25 mg by mouth 3 (three) times daily.    [provider]  insulin aspart (NOVOLOG FLEXPEN) 100 UNIT/ML FlexPen Inject 11-16 Units into the skin 3 (three) times daily with meals. SLIDING SCALE: 150-200= 11 units 201-250= 12 units 251-300= 13 units 301-350= 14 units 351-400= 15 units >400=      16 units Call if less than 70 or greater than 300 for 3 tests  In a row    [provider]  insulin glargine (LANTUS) 100 UNIT/ML injection Inject 65 Units into  the skin at bedtime.    [provider]  lactulose (CHRONULAC) 10 GM/15ML solution Take 20 g by mouth 2 (two) times daily.    [provider]  lisinopril (PRINIVIL,ZESTRIL) 10 MG tablet Take 10 mg by mouth daily.    [provider]  metFORMIN (GLUCOPHAGE) 1000 MG tablet Take 1,000 mg by mouth 2 (two) times daily with a meal.    [provider]  potassium chloride SA (K-DUR,KLOR-CON) 20 MEQ tablet Take 20 mEq by mouth daily.    [provider]  QUEtiapine (SEROQUEL XR) 300 MG 24 hr tablet Take 600 mg by mouth at bedtime.    [provider]  risperidone (RISPERDAL M-TABS) 3 MG disintegrating tablet Take 6 mg by mouth at bedtime.    [provider]  traMADol-acetaminophen (ULTRACET) 37.5-325 MG tablet Take 1 tablet by mouth 2 (two) times daily.    [provider]    Allergies    Bactrim [sulfamethoxazole-trimethoprim], Penicillins, and Latex  Review of Systems   Review of Systems  Constitutional: Negative for chills and fever.  Respiratory: Negative for cough.   Cardiovascular: Positive for leg swelling. Negative for chest pain.  Musculoskeletal: Negative for joint swelling.  Neurological: Negative for weakness and numbness.    Physical Exam Updated Vital Signs BP (!) 157/86 (BP Location: Left Arm)   Pulse 91   Temp 97.6 F (36.4 C) (Oral)   Resp 20   Ht 5\' 9"  (1.753 m)   Wt 82.6 kg   SpO2 95%   BMI 26.88 kg/m   Physical Exam Vitals and nursing note reviewed. Exam conducted with a chaperone present.  Constitutional:      Appearance: Normal appearance.  HENT:     Head: Normocephalic and atraumatic.  Eyes:     General: No scleral icterus.    Conjunctiva/sclera: Conjunctivae normal.  Cardiovascular:     Rate and Rhythm: Normal rate and regular rhythm.     Pulses: Normal pulses.     Heart sounds: Normal heart sounds.  Pulmonary:     Effort: Pulmonary effort is normal. No respiratory distress.     Breath sounds: Normal breath sounds.  Abdominal:     General: Abdomen is flat. There is no distension.     Palpations: Abdomen is soft.     Tenderness: There is no abdominal tenderness.  Musculoskeletal:     Cervical back: Normal range of motion and neck supple. No rigidity.     Right lower leg: Edema present.     Left lower leg: Edema present.     Comments: Bilateral lower extremity swelling, no weeping.  Overlying skin changes appear to be chronic.  Shiny appearance.  Pedal edema bilaterally.  Pulses intact bilaterally.  No sensation loss.  ROM fully intact.  No tenderness to palpation.  No warmth or redness.    Skin:    General: Skin is dry.  Neurological:     General: No focal deficit  present.     Mental Status: She is alert and oriented to person, place, and time.     GCS: GCS eye subscore is 4. GCS verbal subscore is 5. GCS motor subscore is 6.     Cranial Nerves: No cranial nerve deficit.     Sensory: No sensory deficit.     Motor: No weakness.     Coordination: Coordination normal.  Psychiatric:        Mood and Affect: Mood normal.        Behavior: Behavior  normal.        Thought Content: Thought content normal.       ED Results / Procedures / Treatments   Labs (all labs ordered are listed, but only abnormal results are displayed) Labs Reviewed  COMPREHENSIVE METABOLIC PANEL - Abnormal; Notable for the following components:      Result Value   Chloride 95 (*)    Glucose, Bld 163 (*)    All other components within normal limits  URINALYSIS, ROUTINE W REFLEX MICROSCOPIC - Abnormal; Notable for the following components:   APPearance CLOUDY (*)    Leukocytes,Ua SMALL (*)    Bacteria, UA FEW (*)    All other components within normal limits  CBC WITH DIFFERENTIAL/PLATELET  BRAIN NATRIURETIC PEPTIDE  PREGNANCY, URINE    EKG EKG Interpretation  Date/Time:  Friday September 27 2019 15:54:09 EST Ventricular Rate:  74 PR Interval:    QRS Duration: 88 QT Interval:  403 QTC Calculation: 448 R Axis:   62 Text Interpretation: Sinus rhythm No old tracing to compare Confirmed by Daleen Bo [phone]) on 09/27/2019 4:34:14 PM   Radiology DG Chest Portable 1 View  Result Date: 09/27/2019 CLINICAL DATA:  Lower extremity swelling. EXAM: PORTABLE CHEST 1 VIEW COMPARISON:  December 09, 2013 FINDINGS: A trace amount of scarring and/or atelectasis is seen within the lateral aspect of the left lung base. There is no evidence of a pleural effusion or pneumothorax. The heart size and mediastinal contours are within normal limits. Multilevel degenerative changes seen throughout the thoracic spine. IMPRESSION: Trace amount of left basilar scarring and/or atelectasis.  Electronically Signed   By: Virgina Norfolk M.D.   On: 09/27/2019 17:30    Procedures Procedures (including critical care time)  Medications Ordered in ED Medications - No data to display  ED Course  I have reviewed the triage vital signs and the nursing notes.  Pertinent labs & imaging results that were available during my care of the patient were reviewed by me and considered in my medical decision making (see chart for details).    MDM Rules/Calculators/A&P                      Given patient's physical exam, suspect chronic venous insufficiency.  Her lower extremities have a classic "shiny" appearance.  Do not feel as though Unna boots are warranted at this time as patient has not been trying compression stockings.  She has been prescribed them in the past and believes that she might have them at home.  Otherwise, I encouraged her to obtain high-grade compression stockings over-the-counter at pharmacy.  Also encouraging low-salt diet and leg elevation.  Recommend that she follow-up with her primary care provider regarding today's encounter.  UA did not demonstrate any proteinuria and I have low suspicion for nephrotic syndrome contributing to her bilateral leg swelling.  Her BNP was within normal limits at 29.0.  Recommend that she continue taking her Lasix, as prescribed.  CMP and CBC also were unremarkable.  No warmth or redness concerning for cellulitis.  Also do not suspect DVT bilaterally.   Strict return precautions discussed with the patient.  All of the evaluation and work-up results were discussed with the patient and any family at bedside. They were provided opportunity to ask any additional questions and have none at this time. They have expressed understanding of verbal discharge instructions as well as return precautions and are agreeable to the plan.    Final Clinical Impression(s) / ED Diagnoses  Final diagnoses:  Chronic venous insufficiency    Rx / DC Orders ED  Discharge Orders    None       Corena Herter, PA-C 09/27/19 2054    Daleen Bo, MD 09/28/19 1538

## 2019-09-27 NOTE — Discharge Instructions (Addendum)
You appear to have chronic venous insufficiency otherwise no new stasis dermatitis.  I encourage you to wear compression stockings regularly.  If you do not have any at home, encourage you to obtain high-grade stockings over-the-counter at a pharmacy.  Please follow-up with your primary care provider regarding these concerns.  I also request that you elevate your legs regularly and follow a low-salt diet.  Please return to the ED or seek immediate medical attention for any new or worsening symptoms.

## 2019-09-27 NOTE — ED Notes (Signed)
Spoke to Nancy,MT from [facility] [phone]) regarding update.

## 2019-09-27 NOTE — ED Notes (Signed)
RN called to check on transportation update for Pt and was informed that her transportation in in-route and will notify registration upon arrival.

## 2019-09-27 NOTE — ED Notes (Signed)
Regina Watson from facility reports that pt had been on 40 mg of lasix and was just increased to 80 mg on yesterday.  Regina Watson reports pt usually only voids one or two times daily.  Swelling is noted to BLE.  Regina Watson reports pt is legally blind.

## 2019-09-27 NOTE — ED Triage Notes (Signed)
Pt coming from [facility]. She has had swelling in lower extremities for 2 weeks. Nurse practionier wanted her seen today. Noted to be weeping at times

## 2019-09-27 NOTE — ED Provider Notes (Signed)
  Face-to-face evaluation   History: Patient is a somewhat poor historian bilateral here for swelling of her legs.  She also states that she has been "blind for 2 months, because she has diabetes."  Physical exam: Alert, calm and cooperative.  Heart regular rate and rhythm.  Lungs clear anteriorly.  Abdomen soft and nontender.  Lower extremities with 3-4+ pitting edema, bilaterally.  Medical screening examination/treatment/procedure(s) were conducted as a shared visit with non-physician practitioner(s) and myself.  I personally evaluated the patient during the encounter    Daleen Bo, MD 09/28/19 1538

## 2019-10-14 ENCOUNTER — Inpatient Hospital Stay (HOSPITAL_COMMUNITY)
Admission: EM | Admit: 2019-10-14 | Discharge: 2019-10-17 | DRG: 689 | Disposition: A | Discharge status: Home / Self Care | Payer: Medicaid Other | Attending: Internal Medicine | Admitting: Internal Medicine

## 2019-10-14 ENCOUNTER — Encounter (HOSPITAL_COMMUNITY): Payer: Self-pay | Admitting: Emergency Medicine

## 2019-10-14 ENCOUNTER — Emergency Department (HOSPITAL_COMMUNITY): Payer: Medicaid Other

## 2019-10-14 ENCOUNTER — Other Ambulatory Visit: Payer: Self-pay

## 2019-10-14 DIAGNOSIS — F17210 Nicotine dependence, cigarettes, uncomplicated: Secondary | ICD-10-CM | POA: Diagnosis present

## 2019-10-14 DIAGNOSIS — R911 Solitary pulmonary nodule: Secondary | ICD-10-CM

## 2019-10-14 DIAGNOSIS — W19XXXA Unspecified fall, initial encounter: Secondary | ICD-10-CM

## 2019-10-14 DIAGNOSIS — I10 Essential (primary) hypertension: Secondary | ICD-10-CM | POA: Diagnosis present

## 2019-10-14 DIAGNOSIS — I890 Lymphedema, not elsewhere classified: Secondary | ICD-10-CM | POA: Diagnosis present

## 2019-10-14 DIAGNOSIS — Z794 Long term (current) use of insulin: Secondary | ICD-10-CM

## 2019-10-14 DIAGNOSIS — G92 Toxic encephalopathy: Secondary | ICD-10-CM | POA: Diagnosis present

## 2019-10-14 DIAGNOSIS — Z7952 Long term (current) use of systemic steroids: Secondary | ICD-10-CM

## 2019-10-14 DIAGNOSIS — T43505A Adverse effect of unspecified antipsychotics and neuroleptics, initial encounter: Secondary | ICD-10-CM | POA: Diagnosis present

## 2019-10-14 DIAGNOSIS — E1136 Type 2 diabetes mellitus with diabetic cataract: Secondary | ICD-10-CM | POA: Diagnosis present

## 2019-10-14 DIAGNOSIS — H548 Legal blindness, as defined in USA: Secondary | ICD-10-CM | POA: Diagnosis present

## 2019-10-14 DIAGNOSIS — E785 Hyperlipidemia, unspecified: Secondary | ICD-10-CM | POA: Diagnosis present

## 2019-10-14 DIAGNOSIS — E876 Hypokalemia: Secondary | ICD-10-CM

## 2019-10-14 DIAGNOSIS — R8271 Bacteriuria: Secondary | ICD-10-CM | POA: Diagnosis not present

## 2019-10-14 DIAGNOSIS — Z79899 Other long term (current) drug therapy: Secondary | ICD-10-CM

## 2019-10-14 DIAGNOSIS — F209 Schizophrenia, unspecified: Secondary | ICD-10-CM

## 2019-10-14 DIAGNOSIS — E119 Type 2 diabetes mellitus without complications: Secondary | ICD-10-CM | POA: Insufficient documentation

## 2019-10-14 DIAGNOSIS — B961 Klebsiella pneumoniae [K. pneumoniae] as the cause of diseases classified elsewhere: Secondary | ICD-10-CM | POA: Diagnosis present

## 2019-10-14 DIAGNOSIS — Z7982 Long term (current) use of aspirin: Secondary | ICD-10-CM

## 2019-10-14 DIAGNOSIS — Z87440 Personal history of urinary (tract) infections: Secondary | ICD-10-CM

## 2019-10-14 DIAGNOSIS — R4182 Altered mental status, unspecified: Secondary | ICD-10-CM

## 2019-10-14 DIAGNOSIS — G9340 Encephalopathy, unspecified: Secondary | ICD-10-CM | POA: Diagnosis not present

## 2019-10-14 DIAGNOSIS — Z20822 Contact with and (suspected) exposure to covid-19: Secondary | ICD-10-CM | POA: Diagnosis present

## 2019-10-14 DIAGNOSIS — N390 Urinary tract infection, site not specified: Secondary | ICD-10-CM | POA: Diagnosis present

## 2019-10-14 LAB — URINALYSIS, ROUTINE W REFLEX MICROSCOPIC
Bilirubin Urine: NEGATIVE
Glucose, UA: NEGATIVE mg/dL
Hgb urine dipstick: NEGATIVE
Ketones, ur: 5 mg/dL — AB
Nitrite: NEGATIVE
Protein, ur: NEGATIVE mg/dL
Specific Gravity, Urine: 1.015 (ref 1.005–1.030)
pH: 6 (ref 5.0–8.0)

## 2019-10-14 LAB — BASIC METABOLIC PANEL
Anion gap: 15 (ref 5–15)
BUN: 19 mg/dL (ref 6–20)
CO2: 31 mmol/L (ref 22–32)
Calcium: 9.7 mg/dL (ref 8.9–10.3)
Chloride: 94 mmol/L — ABNORMAL LOW (ref 98–111)
Creatinine, Ser: 0.7 mg/dL (ref 0.44–1.00)
GFR calc Af Amer: >60 mL/min (ref >60)
GFR calc non Af Amer: >60 mL/min (ref >60)
Glucose, Bld: 111 mg/dL — ABNORMAL HIGH (ref 70–99)
Potassium: 2.9 mmol/L — ABNORMAL LOW (ref 3.5–5.1)
Sodium: 140 mmol/L (ref 135–145)

## 2019-10-14 LAB — BLOOD GAS, ARTERIAL
Acid-Base Excess: 8.5 mmol/L — ABNORMAL HIGH (ref 0.0–2.0)
Bicarbonate: 31.9 mmol/L — ABNORMAL HIGH (ref 20.0–28.0)
FIO2: 21
O2 Saturation: 93.9 %
Patient temperature: 37
pCO2 arterial: 47.1 mmHg (ref 32.0–48.0)
pH, Arterial: 7.456 — ABNORMAL HIGH (ref 7.350–7.450)
pO2, Arterial: 69.9 mmHg — ABNORMAL LOW (ref 83.0–108.0)

## 2019-10-14 LAB — CBC WITH DIFFERENTIAL/PLATELET
Abs Immature Granulocytes: 0.12 10*3/uL — ABNORMAL HIGH (ref 0.00–0.07)
Basophils Absolute: 0 10*3/uL (ref 0.0–0.1)
Basophils Relative: 0 %
Eosinophils Absolute: 0.2 10*3/uL (ref 0.0–0.5)
Eosinophils Relative: 3 %
HCT: 36 % (ref 36.0–46.0)
Hemoglobin: 11.6 g/dL — ABNORMAL LOW (ref 12.0–15.0)
Immature Granulocytes: 2 %
Lymphocytes Relative: 21 %
Lymphs Abs: 1.6 10*3/uL (ref 0.7–4.0)
MCH: 29.8 pg (ref 26.0–34.0)
MCHC: 32.2 g/dL (ref 30.0–36.0)
MCV: 92.5 fL (ref 80.0–100.0)
Monocytes Absolute: 0.8 10*3/uL (ref 0.1–1.0)
Monocytes Relative: 11 %
Neutro Abs: 4.7 10*3/uL (ref 1.7–7.7)
Neutrophils Relative %: 63 %
Platelets: 270 10*3/uL (ref 150–400)
RBC: 3.89 MIL/uL (ref 3.87–5.11)
RDW: 12.3 % (ref 11.5–15.5)
WBC: 7.5 10*3/uL (ref 4.0–10.5)
nRBC: 0 % (ref 0.0–0.2)

## 2019-10-14 LAB — RAPID URINE DRUG SCREEN, HOSP PERFORMED
Amphetamines: NOT DETECTED
Barbiturates: NOT DETECTED
Benzodiazepines: POSITIVE — AB
Cocaine: NOT DETECTED
Opiates: NOT DETECTED
Tetrahydrocannabinol: NOT DETECTED

## 2019-10-14 LAB — AMMONIA: Ammonia: 34 umol/L (ref 9–35)

## 2019-10-14 LAB — MRSA PCR SCREENING: MRSA by PCR: NEGATIVE

## 2019-10-14 LAB — GLUCOSE, CAPILLARY: Glucose-Capillary: 103 mg/dL — ABNORMAL HIGH (ref 70–99)

## 2019-10-14 LAB — TSH: TSH: 0.67 u[IU]/mL (ref 0.350–4.500)

## 2019-10-14 LAB — VALPROIC ACID LEVEL: Valproic Acid Lvl: 43 ug/mL — ABNORMAL LOW (ref 50.0–100.0)

## 2019-10-14 LAB — HCG, QUANTITATIVE, PREGNANCY: hCG, Beta Chain, Quant, S: <1 m[IU]/mL (ref <5)

## 2019-10-14 LAB — CBG MONITORING, ED: Glucose-Capillary: 82 mg/dL (ref 70–99)

## 2019-10-14 MED ORDER — HEPARIN SODIUM (PORCINE) 5000 UNIT/ML IJ SOLN
5000.0000 [IU] | Freq: Three times a day (TID) | INTRAMUSCULAR | Status: DC
  Administered 2019-10-14 – 2019-10-17 (×8): 5000 [IU] via SUBCUTANEOUS
  Filled 2019-10-14 (×8): qty 1

## 2019-10-14 MED ORDER — LISINOPRIL 10 MG PO TABS
20.0000 mg | ORAL_TABLET | Freq: Every day | ORAL | Status: DC
  Administered 2019-10-15 – 2019-10-17 (×3): 20 mg via ORAL
  Filled 2019-10-14 (×3): qty 2

## 2019-10-14 MED ORDER — SENNOSIDES-DOCUSATE SODIUM 8.6-50 MG PO TABS
1.0000 | ORAL_TABLET | Freq: Two times a day (BID) | ORAL | Status: DC
  Administered 2019-10-14 – 2019-10-17 (×6): 1 via ORAL
  Filled 2019-10-14 (×6): qty 1

## 2019-10-14 MED ORDER — ACETAMINOPHEN 325 MG PO TABS: 650.0000 mg | ORAL_TABLET | ORAL | Status: DC | PRN

## 2019-10-14 MED ORDER — UMECLIDINIUM BROMIDE 62.5 MCG/INH IN AEPB
1.0000 | INHALATION_SPRAY | Freq: Every day | RESPIRATORY_TRACT | Status: DC
  Administered 2019-10-15 – 2019-10-17 (×3): 1 via RESPIRATORY_TRACT

## 2019-10-14 MED ORDER — QUETIAPINE FUMARATE ER 50 MG PO TB24
200.0000 mg | ORAL_TABLET | Freq: Every evening | ORAL | Status: DC
  Administered 2019-10-15 – 2019-10-16 (×3): 200 mg via ORAL
  Filled 2019-10-14 (×4): qty 4

## 2019-10-14 MED ORDER — TIOTROPIUM BROMIDE MONOHYDRATE 18 MCG IN CAPS: 18.0000 ug | ORAL_CAPSULE | Freq: Every day | RESPIRATORY_TRACT | Status: DC

## 2019-10-14 MED ORDER — CLOBETASOL PROPIONATE 0.05 % EX GEL
1.0000 "application " | Freq: Two times a day (BID) | CUTANEOUS | Status: DC
  Filled 2019-10-14: qty 1

## 2019-10-14 MED ORDER — SODIUM CHLORIDE 0.9 % IV BOLUS
500.0000 mL | Freq: Once | INTRAVENOUS | Status: AC
  Administered 2019-10-14: 16:00:00 500 mL via INTRAVENOUS

## 2019-10-14 MED ORDER — LEVOCETIRIZINE DIHYDROCHLORIDE 5 MG PO TABS: 5.0000 mg | ORAL_TABLET | Freq: Every evening | ORAL | Status: DC

## 2019-10-14 MED ORDER — ATORVASTATIN CALCIUM 10 MG PO TABS
10.0000 mg | ORAL_TABLET | Freq: Every evening | ORAL | Status: DC
  Administered 2019-10-15 – 2019-10-16 (×3): 10 mg via ORAL
  Filled 2019-10-14 (×3): qty 1

## 2019-10-14 MED ORDER — MINOXIDIL 5 % EX FOAM: 1.0000 "application " | Freq: Two times a day (BID) | CUTANEOUS | Status: DC

## 2019-10-14 MED ORDER — DIVALPROEX SODIUM ER 500 MG PO TB24
500.0000 mg | ORAL_TABLET | Freq: Every day | ORAL | Status: DC
  Administered 2019-10-14 – 2019-10-16 (×3): 500 mg via ORAL
  Filled 2019-10-14 (×3): qty 1

## 2019-10-14 MED ORDER — LORATADINE 10 MG PO TABS
10.0000 mg | ORAL_TABLET | Freq: Every evening | ORAL | Status: DC
  Administered 2019-10-15 – 2019-10-16 (×3): 10 mg via ORAL
  Filled 2019-10-14 (×3): qty 1

## 2019-10-14 MED ORDER — LACTULOSE 10 GM/15ML PO SOLN
20.0000 g | Freq: Two times a day (BID) | ORAL | Status: DC
  Administered 2019-10-14 – 2019-10-17 (×6): 20 g via ORAL
  Filled 2019-10-14 (×6): qty 30

## 2019-10-14 MED ORDER — POTASSIUM CHLORIDE 10 MEQ/100ML IV SOLN
10.0000 meq | Freq: Once | INTRAVENOUS | Status: AC
  Administered 2019-10-14: 15:00:00 10 meq via INTRAVENOUS
  Filled 2019-10-14: qty 100

## 2019-10-14 MED ORDER — POTASSIUM CHLORIDE CRYS ER 20 MEQ PO TBCR: 40.0000 meq | EXTENDED_RELEASE_TABLET | Freq: Once | ORAL | Status: DC

## 2019-10-14 MED ORDER — COLCHICINE 0.6 MG PO TABS
0.6000 mg | ORAL_TABLET | Freq: Every day | ORAL | Status: DC
  Administered 2019-10-15 – 2019-10-17 (×3): 0.6 mg via ORAL
  Filled 2019-10-14 (×3): qty 1

## 2019-10-14 MED ORDER — SODIUM CHLORIDE 0.9 % IV SOLN
1.0000 g | INTRAVENOUS | Status: AC
  Administered 2019-10-14 – 2019-10-16 (×3): 1 g via INTRAVENOUS
  Filled 2019-10-14 (×3): qty 10

## 2019-10-14 MED ORDER — BENZTROPINE MESYLATE 1 MG PO TABS
0.5000 mg | ORAL_TABLET | Freq: Two times a day (BID) | ORAL | Status: DC
  Administered 2019-10-14 – 2019-10-17 (×6): 0.5 mg via ORAL
  Filled 2019-10-14 (×6): qty 1

## 2019-10-14 MED ORDER — VITAMIN D (ERGOCALCIFEROL) 1.25 MG (50000 UNIT) PO CAPS
50000.0000 [IU] | ORAL_CAPSULE | ORAL | Status: DC
  Administered 2019-10-16: 10:00:00 50000 [IU] via ORAL
  Filled 2019-10-14: qty 1

## 2019-10-14 MED ORDER — POTASSIUM CHLORIDE CRYS ER 20 MEQ PO TBCR
20.0000 meq | EXTENDED_RELEASE_TABLET | Freq: Once | ORAL | Status: DC
  Filled 2019-10-14: qty 1

## 2019-10-14 MED ORDER — INSULIN GLARGINE 100 UNIT/ML ~~LOC~~ SOLN
14.0000 [IU] | Freq: Every day | SUBCUTANEOUS | Status: DC
  Administered 2019-10-14 – 2019-10-16 (×3): 14 [IU] via SUBCUTANEOUS
  Filled 2019-10-14 (×4): qty 0.14

## 2019-10-14 MED ORDER — RISPERIDONE 1 MG PO TABS
1.0000 mg | ORAL_TABLET | Freq: Two times a day (BID) | ORAL | Status: DC
  Administered 2019-10-14 – 2019-10-17 (×6): 1 mg via ORAL
  Filled 2019-10-14 (×6): qty 1

## 2019-10-14 MED ORDER — ASPIRIN EC 81 MG PO TBEC
81.0000 mg | DELAYED_RELEASE_TABLET | Freq: Every day | ORAL | Status: DC
  Administered 2019-10-15 – 2019-10-17 (×3): 81 mg via ORAL
  Filled 2019-10-14 (×3): qty 1

## 2019-10-14 MED ORDER — POTASSIUM CHLORIDE 10 MEQ/100ML IV SOLN
10.0000 meq | INTRAVENOUS | Status: AC
  Administered 2019-10-14 (×2): 10 meq via INTRAVENOUS
  Filled 2019-10-14 (×2): qty 100

## 2019-10-14 MED ORDER — CIPROFLOXACIN IN D5W 400 MG/200ML IV SOLN
400.0000 mg | Freq: Once | INTRAVENOUS | Status: AC
  Administered 2019-10-14: 16:00:00 400 mg via INTRAVENOUS
  Filled 2019-10-14: qty 200

## 2019-10-14 NOTE — ED Notes (Signed)
Regina Watson (supervisior from [facility]) [phone]. (other phone is not working at facility)

## 2019-10-14 NOTE — ED Provider Notes (Addendum)
[facility] EMERGENCY DEPARTMENT Provider Note   CSN: [id number] Arrival date & time: 10/14/19  1051     History No chief complaint on file.   Regina Watson is a 54 y.o. female with a history of diabetes, hypertension, schizophrenia and anemia, also who is legally blind secondary to cataracts presenting for evaluation of a fall which occurred prior to arrival.  She lives at a local rest home called [facility].  Her primary caregiver Izora Gala, per phone conversation states that she was in the restroom when she heard a cry, when she went in there the patient was sitting on the floor, and had fallen off the toilet.  She had difficulty getting off the floor, requiring assistance.  The patient denies any pain or injury from the fall but did endorse hitting the back of her head against the wall.  There is no known LOC at the time of this event.  The caregiver has concerns about increased lower extremity swelling, left greater than right despite being on high dose of Lasix.  She also reports the patient has frequent UTIs and has been urinating more frequently than normal despite stable dosing of her diuretic.  Patient presents very drowsy, although caregiver per phone states that she was wide-awake and conversant when she left their facility.  She has had her morning medicines just prior to arrival.  The history is provided by a caregiver.       Past Medical History:  Diagnosis Date   Anemia    Diabetes mellitus without complication ([place])    Hypertension    MDD (major depressive disorder)    Neuropathy 2010   Schizophrenia [facility])     Patient Active Problem List   Diagnosis Date Noted   Palpitations 10/04/2016   Essential hypertension 10/04/2016   Tobacco use 10/04/2016   Tachycardia 10/04/2016    Past Surgical History:  Procedure Laterality Date   TOE AMPUTATION       OB History   No obstetric history on file.     Family History  Problem Relation Age of  Onset   Hypertension Mother    Cancer Mother    Diabetes Father     Social History   Tobacco Use   Smoking status: Current Every Day Smoker    Packs/day: 1.00    Years: 20.00    Pack years: 20.00    Types: Cigarettes   Smokeless tobacco: Never Used  Substance Use Topics   Alcohol use: No   Drug use: No    Home Medications Prior to Admission medications   Medication Sig Start Date End Date Taking? Authorizing Provider  acetaminophen (TYLENOL) 325 MG tablet Take 650 mg by mouth every 4 (four) hours as needed for mild pain or moderate pain.   Yes [provider]  aspirin EC 81 MG tablet Take 81 mg by mouth every morning.   Yes [provider]  atorvastatin (LIPITOR) 10 MG tablet Take 10 mg by mouth every evening.    Yes [provider]  benztropine (COGENTIN) 0.5 MG tablet Take 0.5 mg by mouth 2 (two) times daily.   Yes [provider]  clobetasol (TEMOVATE) 0.05 % GEL Apply 1 application topically 2 (two) times daily.   Yes [provider]  colchicine 0.6 MG tablet Take 0.6 mg by mouth daily.    Yes [provider]  divalproex (DEPAKOTE ER) 500 MG 24 hr tablet Take 1,000 mg by mouth at bedtime.   Yes [provider]  gabapentin (NEURONTIN) 400 MG capsule Take 400 mg by mouth 4 (four) times daily.    Yes [provider]  hydrOXYzine (ATARAX/VISTARIL) 25 MG tablet Take 25 mg by mouth 3 (three) times daily.   Yes [provider]  insulin aspart (NOVOLOG FLEXPEN) 100 UNIT/ML FlexPen Inject 11-16 Units into the skin 3 (three) times daily with meals. SLIDING SCALE: 150-200= 11 units 201-250= 12 units 251-300= 13 units 301-350= 14 units 351-400= 15 units >400=      16 units Call if less than 70 or greater than 300 for 3 tests  In a row   Yes [provider]  insulin glargine (LANTUS) 100 UNIT/ML injection Inject 20 Units into the skin at bedtime.    Yes [provider]  lactulose  (CHRONULAC) 10 GM/15ML solution Take 20 g by mouth 2 (two) times daily.   Yes [provider]  levocetirizine (XYZAL) 5 MG tablet Take 5 mg by mouth every evening.   Yes [provider]  lisinopril (ZESTRIL) 20 MG tablet Take 20 mg by mouth daily.    Yes [provider]  metFORMIN (GLUCOPHAGE) 1000 MG tablet Take 1,000 mg by mouth 2 (two) times daily with a meal.   Yes [provider]  Minoxidil 5 % FOAM Apply 1 application topically in the morning and at bedtime.   Yes [provider]  nicotine polacrilex (NICORETTE) 4 MG gum Take 4 mg by mouth See admin instructions. Chew one piece 24 times per day for 30 minutes as needed   Yes [provider]  QUEtiapine (SEROQUEL XR) 400 MG 24 hr tablet Take 400 mg by mouth every evening.    Yes [provider]  risperidone (RISPERDAL) 4 MG tablet Take 4 mg by mouth 2 (two) times daily.    Yes [provider]  sennosides-docusate sodium (SENOKOT-S) 8.6-50 MG tablet Take 1 tablet by mouth in the morning and at bedtime.   Yes [provider]  tiotropium (SPIRIVA) 18 MCG inhalation capsule Place 18 mcg into inhaler and inhale daily.   Yes [provider]  triamcinolone ointment (KENALOG) 0.1 % Apply 1 application topically 2 (two) times daily.   Yes [provider]  Vitamin D, Ergocalciferol, (DRISDOL) 1.25 MG (50000 UNIT) CAPS capsule Take 50,000 Units by mouth every 7 (seven) days.   Yes [provider]    Allergies    Bactrim [sulfamethoxazole-trimethoprim], Penicillins, and Latex  Review of Systems   Review of Systems  Unable to perform ROS: Mental status change    Physical Exam Updated Vital Signs BP 128/72 (BP Location: Right Arm)    Pulse (!) 102    Temp 97.8 F (36.6 C) (Oral)    Resp 18    Ht 5\' 9"  (1.753 m)    Wt 82 kg    SpO2 98%    BMI 26.70 kg/m   Physical Exam Vitals and nursing note reviewed.  Constitutional:      General: She is  sleeping.     Appearance: She is well-developed.     Comments: Wakes briefly to voice, quickly falls asleep.  HENT:     Head: Normocephalic and atraumatic.     Mouth/Throat:     Mouth: Mucous membranes are moist.  Eyes:     Conjunctiva/sclera: Conjunctivae normal.  Cardiovascular:     Rate and Rhythm: Regular rhythm. Tachycardia present.     Heart sounds: Normal heart sounds.  Pulmonary:     Effort: Pulmonary effort  is normal.     Breath sounds: Normal breath sounds. No wheezing.  Abdominal:     General: Bowel sounds are normal. There is no distension.     Palpations: Abdomen is soft.     Tenderness: There is no abdominal tenderness. There is no guarding.  Musculoskeletal:        General: Normal range of motion.     Comments: Bilateral lower extremity edema left far greater than right.  She has intact and equal dorsalis pedal pulses.  Skin:    General: Skin is warm and dry.  Neurological:     Mental Status: She is lethargic.     Comments: Patient is very lethargic at baseline, unable to obtain meaningful neuro exam.  She does have good tone in her extremities and is able to hold her arms and legs with controlled movement.     ED Results / Procedures / Treatments   Labs (all labs ordered are listed, but only abnormal results are displayed) Labs Reviewed  URINALYSIS, ROUTINE W REFLEX MICROSCOPIC - Abnormal; Notable for the following components:      Result Value   APPearance HAZY (*)    Ketones, ur 5 (*)    Leukocytes,Ua TRACE (*)    Bacteria, UA MANY (*)    All other components within normal limits  CBC WITH DIFFERENTIAL/PLATELET - Abnormal; Notable for the following components:   Hemoglobin 11.6 (*)    Abs Immature Granulocytes 0.12 (*)    All other components within normal limits  BASIC METABOLIC PANEL - Abnormal; Notable for the following components:   Potassium 2.9 (*)    Chloride 94 (*)    Glucose, Bld 111 (*)    All other components within normal limits    VALPROIC ACID LEVEL - Abnormal; Notable for the following components:   Valproic Acid Lvl 43 (*)    All other components within normal limits  RAPID URINE DRUG SCREEN, HOSP PERFORMED - Abnormal; Notable for the following components:   Benzodiazepines POSITIVE (*)    All other components within normal limits  URINE CULTURE  HCG, QUANTITATIVE, PREGNANCY  AMMONIA  TSH  CBG MONITORING, ED    EKG None  Radiology CT Head Wo Contrast  Result Date: 10/14/2019 CLINICAL DATA:  Head trauma, headache. Poly trauma, critical, head/cervical spine injury suspected. Additional history provided by technologist: Patient tripped and fell today, EXAM: CT HEAD WITHOUT CONTRAST CT CERVICAL SPINE WITHOUT CONTRAST TECHNIQUE: Multidetector CT imaging of the head and cervical spine was performed following the standard protocol without intravenous contrast. Multiplanar CT image reconstructions of the cervical spine were also generated. COMPARISON:  Report from brain MRI 10/24/2016 (images unavailable) FINDINGS: CT HEAD FINDINGS Brain: No evidence of acute intracranial hemorrhage. No demarcated cortical infarction. No evidence of intracranial mass. No midline shift or extra-axial fluid collection. Chronic lacunar infarct within the white matter along the lateral aspect of the left caudate head (series 2, image 14). Background mild ill-defined hypoattenuation within the cerebral white matter is nonspecific, but consistent with chronic small vessel ischemic disease. Moderate generalized parenchymal atrophy, advanced for age. Vascular: No hyperdense vessel.  Atherosclerotic calcifications Skull: Normal. Negative for fracture or focal lesion. Sinuses/Orbits: Visualized orbits demonstrate no acute abnormality. No significant paranasal sinus disease or mastoid effusion at the imaged levels. Other: Soft tissue induration along the occipital calvarium and within the suboccipital region (greater on the left) which may reflect  hematomas. CT CERVICAL SPINE FINDINGS Alignment: Straightening of the expected cervical lordosis. No significant spondylolisthesis.  Skull base and vertebrae: The basion-dental and atlanto-dental intervals are maintained.No evidence of acute fracture to the cervical spine. Soft tissues and spinal canal: No prevertebral fluid or swelling. No visible canal hematoma. Disc levels: No significant bony spinal canal or neural foraminal narrowing at any level. Upper chest: Mild right apical pleuroparenchymal scarring. 8 mm pleural based nodule within the right lung apex (series 5, image 13). IMPRESSION: CT head: 1. No evidence of acute intracranial abnormality. 2. Foci of induration within the occipital and suboccipital scalp which may reflect small hematomas. Clinical correlation is recommended. 3. Mild chronic small vessel ischemic disease. 4. Moderate generalized parenchymal atrophy, advanced for age. CT cervical spine: 1. No evidence of acute fracture to the cervical spine. 2. 8 mm pleural based nodule within the right lung apex. Non-contrast chest CT at 6-12 months is recommended. If the nodule is stable at time of repeat CT, then future CT at 18-24 months (from today's scan) is considered optional for low-risk patients, but is recommended for high-risk patients. This recommendation follows the consensus statement: Guidelines for Management of Incidental Pulmonary Nodules Detected on CT Images: From the Fleischner Society 2017; Radiology 2017; 284:228-243. Electronically Signed   By: Kellie Simmering DO   On: 10/14/2019 13:46   CT Cervical Spine Wo Contrast  Result Date: 10/14/2019 CLINICAL DATA:  Head trauma, headache. Poly trauma, critical, head/cervical spine injury suspected. Additional history provided by technologist: Patient tripped and fell today, EXAM: CT HEAD WITHOUT CONTRAST CT CERVICAL SPINE WITHOUT CONTRAST TECHNIQUE: Multidetector CT imaging of the head and cervical spine was performed following the  standard protocol without intravenous contrast. Multiplanar CT image reconstructions of the cervical spine were also generated. COMPARISON:  Report from brain MRI 10/24/2016 (images unavailable) FINDINGS: CT HEAD FINDINGS Brain: No evidence of acute intracranial hemorrhage. No demarcated cortical infarction. No evidence of intracranial mass. No midline shift or extra-axial fluid collection. Chronic lacunar infarct within the white matter along the lateral aspect of the left caudate head (series 2, image 14). Background mild ill-defined hypoattenuation within the cerebral white matter is nonspecific, but consistent with chronic small vessel ischemic disease. Moderate generalized parenchymal atrophy, advanced for age. Vascular: No hyperdense vessel.  Atherosclerotic calcifications Skull: Normal. Negative for fracture or focal lesion. Sinuses/Orbits: Visualized orbits demonstrate no acute abnormality. No significant paranasal sinus disease or mastoid effusion at the imaged levels. Other: Soft tissue induration along the occipital calvarium and within the suboccipital region (greater on the left) which may reflect hematomas. CT CERVICAL SPINE FINDINGS Alignment: Straightening of the expected cervical lordosis. No significant spondylolisthesis. Skull base and vertebrae: The basion-dental and atlanto-dental intervals are maintained.No evidence of acute fracture to the cervical spine. Soft tissues and spinal canal: No prevertebral fluid or swelling. No visible canal hematoma. Disc levels: No significant bony spinal canal or neural foraminal narrowing at any level. Upper chest: Mild right apical pleuroparenchymal scarring. 8 mm pleural based nodule within the right lung apex (series 5, image 13). IMPRESSION: CT head: 1. No evidence of acute intracranial abnormality. 2. Foci of induration within the occipital and suboccipital scalp which may reflect small hematomas. Clinical correlation is recommended. 3. Mild chronic small  vessel ischemic disease. 4. Moderate generalized parenchymal atrophy, advanced for age. CT cervical spine: 1. No evidence of acute fracture to the cervical spine. 2. 8 mm pleural based nodule within the right lung apex. Non-contrast chest CT at 6-12 months is recommended. If the nodule is stable at time of repeat CT, then future CT at 18-24  months (from today's scan) is considered optional for low-risk patients, but is recommended for high-risk patients. This recommendation follows the consensus statement: Guidelines for Management of Incidental Pulmonary Nodules Detected on CT Images: From the Fleischner Society 2017; Radiology 2017; 284:228-243. Electronically Signed   By: Kellie Simmering DO   On: 10/14/2019 13:46   US Venous Img Lower Unilateral Left (DVT)  Result Date: 10/14/2019 CLINICAL DATA:  Lower extremity edema EXAM: LEFT LOWER EXTREMITY VENOUS DUPLEX ULTRASOUND TECHNIQUE: Gray-scale sonography with graded compression, as well as color Doppler and duplex ultrasound were performed to evaluate the left lower extremity deep venous system from the level of the common femoral vein and including the common femoral, femoral, profunda femoral, popliteal and calf veins including the posterior tibial, peroneal and gastrocnemius veins when visible. The superficial great saphenous vein was also interrogated. Spectral Doppler was utilized to evaluate flow at rest and with distal augmentation maneuvers in the common femoral, femoral and popliteal veins. COMPARISON:  None. FINDINGS: Contralateral Common Femoral Vein: Respiratory phasicity is normal and symmetric with the symptomatic side. No evidence of thrombus. Normal compressibility. Common Femoral Vein: No evidence of thrombus. Normal compressibility, respiratory phasicity and response to augmentation. Saphenofemoral Junction: No evidence of thrombus. Normal compressibility and flow on color Doppler imaging. Profunda Femoral Vein: No evidence of thrombus. Normal  compressibility and flow on color Doppler imaging. Femoral Vein: No evidence of thrombus. Normal compressibility, respiratory phasicity and response to augmentation. Popliteal Vein: No evidence of thrombus. Normal compressibility, respiratory phasicity and response to augmentation. Calf Veins: No evidence of thrombus. Normal compressibility and flow on color Doppler imaging. Superficial Great Saphenous Vein: No evidence of thrombus. Normal compressibility. Venous Reflux:  None. Other Findings:  None. IMPRESSION: No evidence of deep venous thrombosis in the left lower extremity. Right common femoral vein also patent. Electronically Signed   By: Lowella Grip III M.D.   On: 10/14/2019 14:22    Procedures Procedures (including critical care time)  Medications Ordered in ED Medications  potassium chloride SA (KLOR-CON) CR tablet 20 mEq (20 mEq Oral Not Given 10/14/19 1509)  ciprofloxacin (CIPRO) IVPB 400 mg (400 mg Intravenous New Bag/Given 10/14/19 1611)  potassium chloride 10 mEq in 100 mL IVPB (10 mEq Intravenous New Bag/Given 10/14/19 1512)  sodium chloride 0.9 % bolus 500 mL (500 mLs Intravenous New Bag/Given 10/14/19 1613)    ED Course  I have reviewed the triage vital signs and the nursing notes.  Pertinent labs & imaging results that were available during my care of the patient were reviewed by me and considered in my medical decision making (see chart for details).    MDM Rules/Calculators/A&P                      Labs and imaging reviewed.  Patient has a normal WBC count, with no evidence for acute dehydration, she is positive for benzodiazepines which reflects her home medication list.  She does have many bacteria in her urine, trace leukocytes, nitrite negative.  A urine culture has been ordered.  She does endorse increased urinary frequency but denies dysuria.  She was given an IV dose of Cipro in the event this does reflect an acute UTI.  She is hypokalemic with potassium 2.9 and  this was replaced with IV 10 M EQ's along with a p.o. dose of 20 meq.  She is on Depakote for schizophrenia, this level is not supratherapeutic.  Additionally her ammonia level is normal range.  Patient has  altered mental status, possibly secondary to a UTI.  Her vital signs have been stable including has been afebrile.  Call placed to caregiver - no new medications.  Only change was increased dose in lasix.   Pt will admit for AMS.  Call to Dr. Waldron Labs who will see pt in ed.   Final Clinical Impression(s) / ED Diagnoses Final diagnoses:  Lung nodule  Fall, initial encounter  Altered mental status, unspecified altered mental status type  Hypokalemia  Bacteriuria    Rx / DC Orders ED Discharge Orders    None       Landis Martins 10/14/19 1655    Evalee Jefferson, PA-C 10/14/19 1701    Ezequiel Essex, MD 10/14/19 [phone]

## 2019-10-14 NOTE — ED Triage Notes (Addendum)
From [facility]. Pt tripped and fell today.  The group home wanted her to be checked out for bilateral lower edema swelling.  Pt is on 80 mg lasix daily.  Staff also wanted her checked for UTI.

## 2019-10-14 NOTE — H&P (Signed)
TRH H&P   Patient Demographics:    Regina Watson, is a 54 y.o. female  MRN: [id number]   DOB - December 06, 1965  Admit Date - 10/14/2019  Outpatient Primary MD for the patient is Megan Mans, NP  Referring MD/NP/PA: PA Idol  Patient coming from:  [facility] family care  No chief complaint on file.     HPI:    Regina Watson  is a 53 y.o. 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, she was sent by [facility] family care secondary to fall, history was obtained from [place] [phone] caregiver at the facility, at baseline patient is ambulatory, with no cane or walker, but needs assistance with guidance given she is blind(this is secondary to bilateral cataract, was supposed to have cataract removal surgery last Tuesday, appointment could not be kept secondary to transportation issue, as well patient needed her legal guardian to escort her which was not possible then), usually patient is conversant, and appropriate, patient sustained a fall at her facility when she was at the bathroom, this was sustained prior to arrival, she has fallen of the twilight, had difficulty getting of the floor, she did endorse hitting the back of her head against the wall, there is no loss of consciousness at the time of the event, as well the caregiver reports she had worsening lower extremity edema despite increasing her Lasix from 40 to 80 mg recently, and he reports she is having frequent UTIs. -IN ED shunt was noted to be somnolent, wakes up to verbal stimuli, answering yes/no questions, but mentation not at baseline, she was noted to be on multiple antipsychotic medications, but there was no recent adjustment of any of these medications, CT head with no acute intracranial findings, as well work-up significant  for low potassium at 2.9, abnormal UA, I was called to admit for further evaluation    Review of systems:    Patient is confused, cannot provide appropriate review of systems   With Past History of the following :    Past Medical History:  Diagnosis Date  . Anemia   . Diabetes mellitus without complication ([place])   . Hypertension   . MDD (major depressive disorder)   . Neuropathy 2010  . Schizophrenia [facility])       Past Surgical History:  Procedure Laterality Date  . TOE AMPUTATION        Social History:     Social History   Tobacco Use  . Smoking status: Current Every Day Smoker    Packs/day: 1.00    Years: 20.00    Pack years: 20.00    Types: Cigarettes  . Smokeless tobacco: Never Used  Substance Use Topics  . Alcohol use: No       Family History :     Family History  Problem Relation Age of Onset  .  Hypertension Mother   . Cancer Mother   . Diabetes Father       Home Medications:   Prior to Admission medications   Medication Sig Start Date End Date Taking? Authorizing Provider  acetaminophen (TYLENOL) 325 MG tablet Take 650 mg by mouth every 4 (four) hours as needed for mild pain or moderate pain.   Yes [provider]  aspirin EC 81 MG tablet Take 81 mg by mouth every morning.   Yes [provider]  atorvastatin (LIPITOR) 10 MG tablet Take 10 mg by mouth every evening.    Yes [provider]  benztropine (COGENTIN) 0.5 MG tablet Take 0.5 mg by mouth 2 (two) times daily.   Yes [provider]  clobetasol (TEMOVATE) 0.05 % GEL Apply 1 application topically 2 (two) times daily.   Yes [provider]  colchicine 0.6 MG tablet Take 0.6 mg by mouth daily.    Yes [provider]  divalproex (DEPAKOTE ER) 500 MG 24 hr tablet Take 1,000 mg by mouth at bedtime.   Yes [provider]  gabapentin (NEURONTIN) 400 MG capsule Take 400 mg by mouth 4 (four) times daily.    Yes [provider]    hydrOXYzine (ATARAX/VISTARIL) 25 MG tablet Take 25 mg by mouth 3 (three) times daily.   Yes [provider]  insulin aspart (NOVOLOG FLEXPEN) 100 UNIT/ML FlexPen Inject 11-16 Units into the skin 3 (three) times daily with meals. SLIDING SCALE: 150-200= 11 units 201-250= 12 units 251-300= 13 units 301-350= 14 units 351-400= 15 units >400=      16 units Call if less than 70 or greater than 300 for 3 tests  In a row   Yes [provider]  insulin glargine (LANTUS) 100 UNIT/ML injection Inject 20 Units into the skin at bedtime.    Yes [provider]  lactulose (CHRONULAC) 10 GM/15ML solution Take 20 g by mouth 2 (two) times daily.   Yes [provider]  levocetirizine (XYZAL) 5 MG tablet Take 5 mg by mouth every evening.   Yes [provider]  lisinopril (ZESTRIL) 20 MG tablet Take 20 mg by mouth daily.    Yes [provider]  metFORMIN (GLUCOPHAGE) 1000 MG tablet Take 1,000 mg by mouth 2 (two) times daily with a meal.   Yes [provider]  Minoxidil 5 % FOAM Apply 1 application topically in the morning and at bedtime.   Yes [provider]  nicotine polacrilex (NICORETTE) 4 MG gum Take 4 mg by mouth See admin instructions. Chew one piece 24 times per day for 30 minutes as needed   Yes [provider]  QUEtiapine (SEROQUEL XR) 400 MG 24 hr tablet Take 400 mg by mouth every evening.    Yes [provider]  risperidone (RISPERDAL) 4 MG tablet Take 4 mg by mouth 2 (two) times daily.    Yes [provider]  sennosides-docusate sodium (SENOKOT-S) 8.6-50 MG tablet Take 1 tablet by mouth in the morning and at bedtime.   Yes [provider]  tiotropium (SPIRIVA) 18 MCG inhalation capsule Place 18 mcg into inhaler and inhale daily.   Yes [provider]  triamcinolone ointment (KENALOG) 0.1 % Apply 1 application topically 2 (two) times daily.   Yes [provider]  Vitamin D,  Ergocalciferol, (DRISDOL) 1.25 MG (50000 UNIT) CAPS capsule Take 50,000 Units by mouth every 7 (seven) days.   Yes [provider]     Allergies:  Allergies  Allergen Reactions  . Bactrim [Sulfamethoxazole-Trimethoprim] Hives  . Penicillins     Has patient had a PCN reaction causing immediate rash, facial/tongue/throat swelling, SOB or lightheadedness with hypotension: UNKNOWN Has patient had a PCN reaction causing severe rash involving mucus membranes or skin necrosis: UNKNOWN Has patient had a PCN reaction that required hospitalization: UNKNOWN Has patient had a PCN reaction occurring within the last 10 years: UNKNOWN If all of the above answers are "NO", then may proceed with Cephalosporin use.   . Latex Itching     Physical Exam:   Vitals  Blood pressure 128/72, pulse (!) 102, temperature 97.8 F (36.6 C), temperature source Oral, resp. rate 18, height 5\' 9"  (1.753 m), weight 82 kg, SpO2 98 %.   1. General patient is lethargic, laying in bed in no apparent distress, wakes up to verbal stimuli.  2.  Patient is somnolent, fused, unable to answer questions appropriately .  3.  Patient does not follow commands appropriately, but appears to be moving all extremities without significant deficits .  4.  Patient has cataract bilaterally, pacified pupils bilaterally moist Oral Mucosa.  5. Supple Neck, No JVD, No cervical lymphadenopathy appriciated, No Carotid Bruits.  6. Symmetrical Chest wall movement, Good air movement bilaterally, CTAB.  7. RRR, No Gallops, Rubs or Murmurs, No Parasternal Heave.  8. Positive Bowel Sounds, Abdomen Soft, No tenderness, No organomegaly appriciated,No rebound -guarding or rigidity.  9.  No Cyanosis, Normal Skin Turgor, No Skin Rash or Bruise.  10. Good muscle tone,  joints appear normal , +2 edema bilaterally  11. No Palpable Lymph Nodes in Neck or Axillae    Data Review:    CBC Recent Labs  Lab 10/14/19 1252  WBC 7.5    HGB 11.6*  HCT 36.0  PLT 270  MCV 92.5  MCH 29.8  MCHC 32.2  RDW 12.3  LYMPHSABS 1.6  MONOABS 0.8  EOSABS 0.2  BASOSABS 0.0   ------------------------------------------------------------------------------------------------------------------  Chemistries  Recent Labs  Lab 10/14/19 1252  NA 140  K 2.9*  CL 94*  CO2 31  GLUCOSE 111*  BUN 19  CREATININE 0.70  CALCIUM 9.7   ------------------------------------------------------------------------------------------------------------------ estimated creatinine clearance is 94.1 mL/min (by C-G formula based on SCr of 0.7 mg/dL). ------------------------------------------------------------------------------------------------------------------ Recent Labs    10/14/19 1155  TSH 0.670    Coagulation profile No results for input(s): INR, PROTIME in the last 168 hours. ------------------------------------------------------------------------------------------------------------------- No results for input(s): DDIMER in the last 72 hours. -------------------------------------------------------------------------------------------------------------------  Cardiac Enzymes No results for input(s): CKMB, TROPONINI, MYOGLOBIN in the last 168 hours.  Invalid input(s): CK ------------------------------------------------------------------------------------------------------------------    Component Value Date/Time   BNP 29.0 09/27/2019 1800     ---------------------------------------------------------------------------------------------------------------  Urinalysis    Component Value Date/Time   COLORURINE YELLOW 10/14/2019 1310   APPEARANCEUR HAZY (A) 10/14/2019 1310   LABSPEC 1.015 10/14/2019 1310   PHURINE 6.0 10/14/2019 1310   GLUCOSEU NEGATIVE 10/14/2019 1310   HGBUR NEGATIVE 10/14/2019 1310   BILIRUBINUR NEGATIVE 10/14/2019 1310   KETONESUR 5 (A) 10/14/2019 1310   PROTEINUR NEGATIVE 10/14/2019 1310   UROBILINOGEN 0.2  12/09/2013 2250   NITRITE NEGATIVE 10/14/2019 1310   LEUKOCYTESUR TRACE (A) 10/14/2019 1310    ----------------------------------------------------------------------------------------------------------------   Imaging Results:    CT Head Wo Contrast  Result Date: 10/14/2019 CLINICAL DATA:  Head trauma, headache. Poly trauma, critical, head/cervical spine injury suspected. Additional history provided by technologist: Patient tripped and fell today, EXAM: CT HEAD WITHOUT CONTRAST CT CERVICAL SPINE WITHOUT CONTRAST  TECHNIQUE: Multidetector CT imaging of the head and cervical spine was performed following the standard protocol without intravenous contrast. Multiplanar CT image reconstructions of the cervical spine were also generated. COMPARISON:  Report from brain MRI 10/24/2016 (images unavailable) FINDINGS: CT HEAD FINDINGS Brain: No evidence of acute intracranial hemorrhage. No demarcated cortical infarction. No evidence of intracranial mass. No midline shift or extra-axial fluid collection. Chronic lacunar infarct within the white matter along the lateral aspect of the left caudate head (series 2, image 14). Background mild ill-defined hypoattenuation within the cerebral white matter is nonspecific, but consistent with chronic small vessel ischemic disease. Moderate generalized parenchymal atrophy, advanced for age. Vascular: No hyperdense vessel.  Atherosclerotic calcifications Skull: Normal. Negative for fracture or focal lesion. Sinuses/Orbits: Visualized orbits demonstrate no acute abnormality. No significant paranasal sinus disease or mastoid effusion at the imaged levels. Other: Soft tissue induration along the occipital calvarium and within the suboccipital region (greater on the left) which may reflect hematomas. CT CERVICAL SPINE FINDINGS Alignment: Straightening of the expected cervical lordosis. No significant spondylolisthesis. Skull base and vertebrae: The basion-dental and atlanto-dental  intervals are maintained.No evidence of acute fracture to the cervical spine. Soft tissues and spinal canal: No prevertebral fluid or swelling. No visible canal hematoma. Disc levels: No significant bony spinal canal or neural foraminal narrowing at any level. Upper chest: Mild right apical pleuroparenchymal scarring. 8 mm pleural based nodule within the right lung apex (series 5, image 13). IMPRESSION: CT head: 1. No evidence of acute intracranial abnormality. 2. Foci of induration within the occipital and suboccipital scalp which may reflect small hematomas. Clinical correlation is recommended. 3. Mild chronic small vessel ischemic disease. 4. Moderate generalized parenchymal atrophy, advanced for age. CT cervical spine: 1. No evidence of acute fracture to the cervical spine. 2. 8 mm pleural based nodule within the right lung apex. Non-contrast chest CT at 6-12 months is recommended. If the nodule is stable at time of repeat CT, then future CT at 18-24 months (from today's scan) is considered optional for low-risk patients, but is recommended for high-risk patients. This recommendation follows the consensus statement: Guidelines for Management of Incidental Pulmonary Nodules Detected on CT Images: From the Fleischner Society 2017; Radiology 2017; 284:228-243. Electronically Signed   By: Kellie Simmering DO   On: 10/14/2019 13:46   CT Cervical Spine Wo Contrast  Result Date: 10/14/2019 CLINICAL DATA:  Head trauma, headache. Poly trauma, critical, head/cervical spine injury suspected. Additional history provided by technologist: Patient tripped and fell today, EXAM: CT HEAD WITHOUT CONTRAST CT CERVICAL SPINE WITHOUT CONTRAST TECHNIQUE: Multidetector CT imaging of the head and cervical spine was performed following the standard protocol without intravenous contrast. Multiplanar CT image reconstructions of the cervical spine were also generated. COMPARISON:  Report from brain MRI 10/24/2016 (images unavailable)  FINDINGS: CT HEAD FINDINGS Brain: No evidence of acute intracranial hemorrhage. No demarcated cortical infarction. No evidence of intracranial mass. No midline shift or extra-axial fluid collection. Chronic lacunar infarct within the white matter along the lateral aspect of the left caudate head (series 2, image 14). Background mild ill-defined hypoattenuation within the cerebral white matter is nonspecific, but consistent with chronic small vessel ischemic disease. Moderate generalized parenchymal atrophy, advanced for age. Vascular: No hyperdense vessel.  Atherosclerotic calcifications Skull: Normal. Negative for fracture or focal lesion. Sinuses/Orbits: Visualized orbits demonstrate no acute abnormality. No significant paranasal sinus disease or mastoid effusion at the imaged levels. Other: Soft tissue induration along the occipital calvarium and within the suboccipital region (  greater on the left) which may reflect hematomas. CT CERVICAL SPINE FINDINGS Alignment: Straightening of the expected cervical lordosis. No significant spondylolisthesis. Skull base and vertebrae: The basion-dental and atlanto-dental intervals are maintained.No evidence of acute fracture to the cervical spine. Soft tissues and spinal canal: No prevertebral fluid or swelling. No visible canal hematoma. Disc levels: No significant bony spinal canal or neural foraminal narrowing at any level. Upper chest: Mild right apical pleuroparenchymal scarring. 8 mm pleural based nodule within the right lung apex (series 5, image 13). IMPRESSION: CT head: 1. No evidence of acute intracranial abnormality. 2. Foci of induration within the occipital and suboccipital scalp which may reflect small hematomas. Clinical correlation is recommended. 3. Mild chronic small vessel ischemic disease. 4. Moderate generalized parenchymal atrophy, advanced for age. CT cervical spine: 1. No evidence of acute fracture to the cervical spine. 2. 8 mm pleural based nodule  within the right lung apex. Non-contrast chest CT at 6-12 months is recommended. If the nodule is stable at time of repeat CT, then future CT at 18-24 months (from today's scan) is considered optional for low-risk patients, but is recommended for high-risk patients. This recommendation follows the consensus statement: Guidelines for Management of Incidental Pulmonary Nodules Detected on CT Images: From the Fleischner Society 2017; Radiology 2017; 284:228-243. Electronically Signed   By: Kellie Simmering DO   On: 10/14/2019 13:46   US Venous Img Lower Unilateral Left (DVT)  Result Date: 10/14/2019 CLINICAL DATA:  Lower extremity edema EXAM: LEFT LOWER EXTREMITY VENOUS DUPLEX ULTRASOUND TECHNIQUE: Gray-scale sonography with graded compression, as well as color Doppler and duplex ultrasound were performed to evaluate the left lower extremity deep venous system from the level of the common femoral vein and including the common femoral, femoral, profunda femoral, popliteal and calf veins including the posterior tibial, peroneal and gastrocnemius veins when visible. The superficial great saphenous vein was also interrogated. Spectral Doppler was utilized to evaluate flow at rest and with distal augmentation maneuvers in the common femoral, femoral and popliteal veins. COMPARISON:  None. FINDINGS: Contralateral Common Femoral Vein: Respiratory phasicity is normal and symmetric with the symptomatic side. No evidence of thrombus. Normal compressibility. Common Femoral Vein: No evidence of thrombus. Normal compressibility, respiratory phasicity and response to augmentation. Saphenofemoral Junction: No evidence of thrombus. Normal compressibility and flow on color Doppler imaging. Profunda Femoral Vein: No evidence of thrombus. Normal compressibility and flow on color Doppler imaging. Femoral Vein: No evidence of thrombus. Normal compressibility, respiratory phasicity and response to augmentation. Popliteal Vein: No evidence  of thrombus. Normal compressibility, respiratory phasicity and response to augmentation. Calf Veins: No evidence of thrombus. Normal compressibility and flow on color Doppler imaging. Superficial Great Saphenous Vein: No evidence of thrombus. Normal compressibility. Venous Reflux:  None. Other Findings:  None. IMPRESSION: No evidence of deep venous thrombosis in the left lower extremity. Right common femoral vein also patent. Electronically Signed   By: Lowella Grip III M.D.   On: 10/14/2019 14:22    EKG was not ordered today, I have ordered EKG ,its pending.   Assessment & Plan:    Active Problems:   Essential hypertension   Encephalopathy acute   Schizophrenia (HCC)   Acute encephalopathy  Acute encephalopathy -She is currently altered, somnolent in ED, wakes up and answer simple questions, but she is confused, this is not her baseline. -CT head with no acute findings -This is most like related to polypharmacy, as she is on multiple antipsychotic medication including Seroquel, Cogentin, Risperdal, gabapentin,  hydroxyzine, Depakote, as well her urine drug screen positive for benzos, I will hold her hydroxyzine, gabapentin, and resume her at a lower Depakote, Risperdal, Cogentin and Seroquel dose to see if this helps. -We will work-up significant for UTI, will treat. -If no improvement in 24 hours after holding his medications, likely will need either repeat CT head or MRI brain. -Will obtain ABG to ensure there is no CO2 retention  Hypokalemia -Bleeding, recheck in a.m.Marland Kitchen  Cataract -Patient with bilateral cataract, supposed to have surgery last Tuesday, but had difficulty with transportation.  Lung nodules -Incidental finding on CT cervical, will need repeat CT in 6 to 21-month.  Lower extremity edema -Chronic, But worsening recently, her Lasix has been increased from 40 to 80 mg daily to her facility, likely she will need IV diuresis, but will await till her hypokalemia is  corrected  Schizophrenia -Please see above discussion regarding medication regimen  Hypertension -Continue with home medications  Diabetes mellitus -On Metformin, resume Lantus at a lower dose and add insulin sliding scale  Hyperlipidemia -Resume statin   DVT Prophylaxis Lovenox - SCDs  AM Labs Ordered, also please review Full Orders  Family Communication: Admission, patients condition and plan of care including tests being ordered have been discussed with the patient and caregiver at facility [facility] [phone] who indicate understanding and agree with the plan and Code Status.  Code Status Full  Likely DC to group home  Condition GUARDED   Consults called: None  Admission status: Observation  Time spent in minutes : 65 minutes   Phillips Climes M.D on 10/14/2019 at 5:16 PM  Between 7am to 7pm - Pager - [phone]. After 7pm go to [email or web address] - password [facility]  [facility] - Office  [phone]

## 2019-10-14 NOTE — Plan of Care (Signed)
Pt's sister was called & updated

## 2019-10-15 ENCOUNTER — Observation Stay (HOSPITAL_COMMUNITY): Payer: Medicaid Other

## 2019-10-15 DIAGNOSIS — Z20822 Contact with and (suspected) exposure to covid-19: Secondary | ICD-10-CM | POA: Diagnosis present

## 2019-10-15 DIAGNOSIS — E1136 Type 2 diabetes mellitus with diabetic cataract: Secondary | ICD-10-CM | POA: Diagnosis present

## 2019-10-15 DIAGNOSIS — I890 Lymphedema, not elsewhere classified: Secondary | ICD-10-CM | POA: Diagnosis present

## 2019-10-15 DIAGNOSIS — Z7952 Long term (current) use of systemic steroids: Secondary | ICD-10-CM | POA: Diagnosis not present

## 2019-10-15 DIAGNOSIS — R600 Localized edema: Secondary | ICD-10-CM

## 2019-10-15 DIAGNOSIS — E785 Hyperlipidemia, unspecified: Secondary | ICD-10-CM | POA: Diagnosis present

## 2019-10-15 DIAGNOSIS — E876 Hypokalemia: Secondary | ICD-10-CM | POA: Diagnosis present

## 2019-10-15 DIAGNOSIS — Z7982 Long term (current) use of aspirin: Secondary | ICD-10-CM | POA: Diagnosis not present

## 2019-10-15 DIAGNOSIS — N390 Urinary tract infection, site not specified: Secondary | ICD-10-CM | POA: Diagnosis present

## 2019-10-15 DIAGNOSIS — Z794 Long term (current) use of insulin: Secondary | ICD-10-CM | POA: Diagnosis not present

## 2019-10-15 DIAGNOSIS — R4182 Altered mental status, unspecified: Secondary | ICD-10-CM | POA: Diagnosis present

## 2019-10-15 DIAGNOSIS — I10 Essential (primary) hypertension: Secondary | ICD-10-CM | POA: Diagnosis present

## 2019-10-15 DIAGNOSIS — H548 Legal blindness, as defined in USA: Secondary | ICD-10-CM | POA: Diagnosis present

## 2019-10-15 DIAGNOSIS — B961 Klebsiella pneumoniae [K. pneumoniae] as the cause of diseases classified elsewhere: Secondary | ICD-10-CM | POA: Diagnosis present

## 2019-10-15 DIAGNOSIS — F17210 Nicotine dependence, cigarettes, uncomplicated: Secondary | ICD-10-CM | POA: Diagnosis present

## 2019-10-15 DIAGNOSIS — T43505A Adverse effect of unspecified antipsychotics and neuroleptics, initial encounter: Secondary | ICD-10-CM | POA: Diagnosis present

## 2019-10-15 DIAGNOSIS — Z79899 Other long term (current) drug therapy: Secondary | ICD-10-CM | POA: Diagnosis not present

## 2019-10-15 DIAGNOSIS — Z87440 Personal history of urinary (tract) infections: Secondary | ICD-10-CM | POA: Diagnosis not present

## 2019-10-15 DIAGNOSIS — F209 Schizophrenia, unspecified: Secondary | ICD-10-CM | POA: Diagnosis present

## 2019-10-15 DIAGNOSIS — G9340 Encephalopathy, unspecified: Secondary | ICD-10-CM | POA: Diagnosis not present

## 2019-10-15 DIAGNOSIS — G92 Toxic encephalopathy: Secondary | ICD-10-CM | POA: Diagnosis present

## 2019-10-15 LAB — GLUCOSE, CAPILLARY
Glucose-Capillary: 132 mg/dL — ABNORMAL HIGH (ref 70–99)
Glucose-Capillary: 166 mg/dL — ABNORMAL HIGH (ref 70–99)
Glucose-Capillary: 142 mg/dL — ABNORMAL HIGH (ref 70–99)

## 2019-10-15 LAB — IRON AND TIBC
Iron: 57 ug/dL (ref 28–170)
Saturation Ratios: 24 % (ref 10.4–31.8)
TIBC: 242 ug/dL — ABNORMAL LOW (ref 250–450)
UIBC: 185 ug/dL

## 2019-10-15 LAB — RETICULOCYTES
Immature Retic Fract: 12.1 % (ref 2.3–15.9)
RBC.: 3.08 MIL/uL — ABNORMAL LOW (ref 3.87–5.11)
Retic Count, Absolute: 37.3 10*3/uL (ref 19.0–186.0)
Retic Ct Pct: 1.2 % (ref 0.4–3.1)

## 2019-10-15 LAB — COMPREHENSIVE METABOLIC PANEL
ALT: 26 U/L (ref 0–44)
AST: 31 U/L (ref 15–41)
Albumin: 2.7 g/dL — ABNORMAL LOW (ref 3.5–5.0)
Alkaline Phosphatase: 62 U/L (ref 38–126)
Anion gap: 9 (ref 5–15)
BUN: 18 mg/dL (ref 6–20)
CO2: 30 mmol/L (ref 22–32)
Calcium: 8.9 mg/dL (ref 8.9–10.3)
Chloride: 100 mmol/L (ref 98–111)
Creatinine, Ser: 0.57 mg/dL (ref 0.44–1.00)
GFR calc Af Amer: >60 mL/min (ref >60)
GFR calc non Af Amer: >60 mL/min (ref >60)
Glucose, Bld: 118 mg/dL — ABNORMAL HIGH (ref 70–99)
Potassium: 3.4 mmol/L — ABNORMAL LOW (ref 3.5–5.1)
Sodium: 139 mmol/L (ref 135–145)
Total Bilirubin: 0.5 mg/dL (ref 0.3–1.2)
Total Protein: 6.5 g/dL (ref 6.5–8.1)

## 2019-10-15 LAB — CBC
HCT: 28.3 % — ABNORMAL LOW (ref 36.0–46.0)
Hemoglobin: 8.9 g/dL — ABNORMAL LOW (ref 12.0–15.0)
MCH: 29 pg (ref 26.0–34.0)
MCHC: 31.4 g/dL (ref 30.0–36.0)
MCV: 92.2 fL (ref 80.0–100.0)
Platelets: 244 10*3/uL (ref 150–400)
RBC: 3.07 MIL/uL — ABNORMAL LOW (ref 3.87–5.11)
RDW: 12.1 % (ref 11.5–15.5)
WBC: 6.4 10*3/uL (ref 4.0–10.5)
nRBC: 0 % (ref 0.0–0.2)

## 2019-10-15 LAB — OCCULT BLOOD X 1 CARD TO LAB, STOOL: Fecal Occult Bld: NEGATIVE

## 2019-10-15 LAB — SARS CORONAVIRUS 2 (TAT 6-24 HRS): SARS Coronavirus 2: NEGATIVE

## 2019-10-15 LAB — FERRITIN: Ferritin: 257 ng/mL (ref 11–307)

## 2019-10-15 LAB — VITAMIN B12: Vitamin B-12: 449 pg/mL (ref 180–914)

## 2019-10-15 LAB — HIV ANTIBODY (ROUTINE TESTING W REFLEX): HIV Screen 4th Generation wRfx: NONREACTIVE

## 2019-10-15 LAB — ECHOCARDIOGRAM COMPLETE
Height: 69 in
Weight: 2892.44 oz

## 2019-10-15 LAB — FOLATE: Folate: 8.7 ng/mL (ref >5.9)

## 2019-10-15 LAB — MAGNESIUM: Magnesium: 1.7 mg/dL (ref 1.7–2.4)

## 2019-10-15 MED ORDER — MAGNESIUM SULFATE 2 GM/50ML IV SOLN
2.0000 g | Freq: Once | INTRAVENOUS | Status: AC
  Administered 2019-10-15: 18:00:00 2 g via INTRAVENOUS
  Filled 2019-10-15: qty 50

## 2019-10-15 MED ORDER — POTASSIUM CHLORIDE CRYS ER 20 MEQ PO TBCR
40.0000 meq | EXTENDED_RELEASE_TABLET | Freq: Once | ORAL | Status: AC
  Administered 2019-10-15: 40 meq via ORAL
  Filled 2019-10-15: qty 2

## 2019-10-15 MED ORDER — UMECLIDINIUM BROMIDE 62.5 MCG/INH IN AEPB
INHALATION_SPRAY | RESPIRATORY_TRACT | Status: AC
  Filled 2019-10-15: qty 7

## 2019-10-15 NOTE — TOC Initial Note (Signed)
Transition of Care [facility]) - Initial/Assessment Note    Patient Details  Name: Regina Watson MRN: [id number] Date of Birth: 07-Mar-1966  [facility] CM/SW Contact:    Shade Flood, LCSW Phone Number: 10/15/2019, 2:25 PM  Clinical Narrative:                  Pt admitted from [facility]. Per MD, pt may be ready for dc tomorrow. Spoke with pt's caregiver today to update [phone]). Pt can return to the facility at dc and they will transport.   Assigned TOC will follow up tomorrow to further assist with dc planning needs.  Expected Discharge Plan: Group Home Barriers to Discharge: Continued Medical Work up   Patient Goals and CMS Choice        Expected Discharge Plan and Services Expected Discharge Plan: Group Home In-house Referral: Clinical Social Work     Living arrangements for the past 2 months: Group Home                                      Prior Living Arrangements/Services Living arrangements for the past 2 months: [place] Lives with:: Facility Resident Patient language and need for interpreter reviewed:: Yes Do you feel safe going back to the place where you live?: Yes      Need for Family Participation in Patient Care: No (Comment) Care giver support system in place?: Yes (comment)   Criminal Activity/Legal Involvement Pertinent to Current Situation/Hospitalization: No - Comment as needed  Activities of Daily Living Home Assistive Devices/Equipment: CBG Meter ADL Screening (condition at time of admission) Patient's cognitive ability adequate to safely complete daily activities?: No Is the patient deaf or have difficulty hearing?: Yes Does the patient have difficulty seeing, even when wearing glasses/contacts?: No Does the patient have difficulty concentrating, remembering, or making decisions?: Yes Patient able to express need for assistance with ADLs?: Yes Does the patient have difficulty dressing or bathing?:  Yes Independently performs ADLs?: No Communication: Independent Dressing (OT): Needs assistance Is this a change from baseline?: Pre-admission baseline Grooming: Needs assistance Is this a change from baseline?: Pre-admission baseline Feeding: Independent Bathing: Needs assistance Is this a change from baseline?: Pre-admission baseline Toileting: Needs assistance Is this a change from baseline?: Pre-admission baseline In/Out Bed: Needs assistance Is this a change from baseline?: Pre-admission baseline Walks in Home: Needs assistance Is this a change from baseline?: Pre-admission baseline Does the patient have difficulty walking or climbing stairs?: No Weakness of Legs: Both Weakness of Arms/Hands: None  Permission Sought/Granted                  Emotional Assessment       Orientation: : Oriented to Self, Oriented to Situation, Oriented to Place Alcohol / Substance Use: Not Applicable Psych Involvement: No (comment)  Admission diagnosis:  Hypokalemia [E87.6] Lung nodule [R91.1] Bacteriuria [R82.71] Acute encephalopathy [G93.40] Fall, initial encounter [W19.XXXA] Altered mental status, unspecified altered mental status type [R41.82] UTI (urinary tract infection) [N39.0] Patient Active Problem List   Diagnosis Date Noted  . UTI (urinary tract infection) 10/15/2019  . Encephalopathy acute 10/14/2019  . Schizophrenia ([place]) 10/14/2019  . Type 2 diabetes mellitus without complication ([place]) [id number]  . Acute encephalopathy 10/14/2019  . Palpitations 10/04/2016  . Essential hypertension 10/04/2016  . Tobacco use 10/04/2016  . Tachycardia 10/04/2016   PCP:  Megan Mans, NP Pharmacy:  [facility] [address] [place] [address] Phone: [phone] Fax: [phone]     Social Determinants of Health (SDOH) Interventions    Readmission Risk Interventions No flowsheet data found.

## 2019-10-15 NOTE — Progress Notes (Signed)
*  PRELIMINARY RESULTS* Echocardiogram 2D Echocardiogram has been performed.  Leavy Cella 10/15/2019, 3:05 PM

## 2019-10-15 NOTE — Progress Notes (Addendum)
PROGRESS NOTE [facility]   Regina Watson  [id number]  DOB: 03-Feb-1966  DOA: 10/14/2019 PCP: Megan Mans, NP   Brief Admission Hx: 54 y.o. 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 She was noted to be confused and started on treatment for a UTI.    MDM/Assessment & Plan:   1. Acute toxic encephalopathy - presumably secondary to polypharmacy. Pt presented on multiple antipsychotic medications and mind altering medications.  We have carefully reduced doses of some of her medications to try to improve her current condition. This seems to be helping as she is much less confused today and she has been able to eat and drink today.  We are also treating UTI and providing supportive care.   2. Hypokalemia - improving, give additional K today and recheck in AM. Follow magnesium.   3. Fall - I have asked for a PT evaluation.  Fall precautions.  4. UTI - continue ceftriaxone IV x 3 total doses  5. Lung nodule - Will need to arrange repeat CT chest in 6-12 months.  A future order can be placed at discharge or PCP will need to arrange.  6. Schizophrenia - careful reduction in home meds as discussed above due to pt presenting with encephalopathy.   7. Type 2 Diabetes mellitus - reduced basal and SSI coverage ordered with CBG testing.  8. Hyperlipidemia - resumed home statin therapy.  9. Chronic lymphedema bilateral LEs - temporarily holding lasix as she is recovering from acute illness.  Hopefully can resume in next 1-2 days. 10. Bilateral cataract - Pt will need to reschedule cataract surgery with her ophthalmologist.  11. Anemia, unspecified - check stool hemoccult, check anemia panel.    DVT prophylaxis: enoxaparin / TED hose Code Status: Full  Family Communication: sister  Disposition Plan: from group home, continue IV  antibiotics and supportive care today, PT evaluation, likely could return home in next 1-2 days.    Consultants:  PT  Procedures:    Antimicrobials:  ceftriaxone   Subjective: Pt has no specific complaints today.   Objective: Vitals:   10/14/19 2045 10/14/19 2105 10/15/19 0625 10/15/19 0833  BP: (!) 149/83  115/67 136/80  Pulse: (!) 102  92   Resp: 16  16   Temp: 97.8 F (36.6 C)  98.8 F (37.1 C)   TempSrc: Oral  Oral   SpO2: 100% 100% 96%   Weight:      Height:        Intake/Output Summary (Last 24 hours) at 10/15/2019 1036 Last data filed at 10/15/2019 0700 Gross per 24 hour  Intake 690 ml  Output --  Net 690 ml   Filed Weights   10/14/19 1112  Weight: 82 kg    REVIEW OF SYSTEMS  As per history otherwise all reviewed and reported negative  Exam:  General exam: Pt is blind but hears well, sitting up in chair eating breakfast, aware in hospital Respiratory system: Clear. No increased work of breathing. Cardiovascular system: normal S1 & S2 heard. No JVD, murmurs, gallops, clicks or pedal edema. Gastrointestinal system: Abdomen is nondistended, soft and nontender. Normal bowel sounds heard. Central nervous system: legally blind. Alert and oriented to person and place. No focal neurological deficits. Extremities: 2+ edema BLEs.  Data Reviewed: Basic Metabolic Panel: Recent Labs  Lab 10/14/19 1252 10/15/19 0448  NA 140 139  K  2.9* 3.4*  CL 94* 100  CO2 31 30  GLUCOSE 111* 118*  BUN 19 18  CREATININE 0.70 0.57  CALCIUM 9.7 8.9   Liver Function Tests: Recent Labs  Lab 10/15/19 0448  AST 31  ALT 26  ALKPHOS 62  BILITOT 0.5  PROT 6.5  ALBUMIN 2.7*   No results for input(s): LIPASE, AMYLASE in the last 168 hours. Recent Labs  Lab 10/14/19 1155  AMMONIA 34   CBC: Recent Labs  Lab 10/14/19 1252 10/15/19 0448  WBC 7.5 6.4  NEUTROABS 4.7  --   HGB 11.6* 8.9*  HCT 36.0 28.3*  MCV 92.5 92.2  PLT 270 244   Cardiac Enzymes: No  results for input(s): CKTOTAL, CKMB, CKMBINDEX, TROPONINI in the last 168 hours. CBG (last 3)  Recent Labs    10/14/19 1338 10/14/19 2133 10/15/19 0741  GLUCAP 82 103* 132*   Recent Results (from the past 240 hour(s))  SARS CORONAVIRUS 2 (TAT 6-24 HRS) Nasopharyngeal Nasopharyngeal Swab     Status: None   Collection Time: 10/14/19  5:35 PM   Specimen: Nasopharyngeal Swab  Result Value Ref Range Status   SARS Coronavirus 2 NEGATIVE NEGATIVE Final    Comment: (NOTE) SARS-CoV-2 target nucleic acids are NOT DETECTED. The SARS-CoV-2 RNA is generally detectable in upper and lower respiratory specimens during the acute phase of infection. Negative results do not preclude SARS-CoV-2 infection, do not rule out co-infections with other pathogens, and should not be used as the sole basis for treatment or other patient management decisions. Negative results must be combined with clinical observations, patient history, and epidemiological information. The expected result is Negative. Fact Sheet for Patients: SugarRoll.be Fact Sheet for Healthcare Providers: https://www.woods-mathews.com/ This test is not yet approved or cleared by the [place] FDA and  has been authorized for detection and/or diagnosis of SARS-CoV-2 by FDA under an Emergency Use Authorization (EUA). This EUA will remain  in effect (meaning this test can be used) for the duration of the COVID-19 declaration under Section 56 4(b)(1) of the Act, 21 U.S.C. section 360bbb-3(b)(1), unless the authorization is terminated or revoked sooner. Performed at [facility], [place] [address], [place], [place] [address]   MRSA PCR Screening     Status: None   Collection Time: 10/14/19  7:49 PM   Specimen: Nasopharyngeal  Result Value Ref Range Status   MRSA by PCR NEGATIVE NEGATIVE Final    Comment:        The GeneXpert MRSA Assay (FDA approved for NASAL specimens only), is one  component of a comprehensive MRSA colonization surveillance program. It is not intended to diagnose MRSA infection nor to guide or monitor treatment for MRSA infections. Performed at [facility], [address] [place], [place] [address]      Studies: CT Head Wo Contrast  Result Date: 10/14/2019 CLINICAL DATA:  Head trauma, headache. Poly trauma, critical, head/cervical spine injury suspected. Additional history provided by technologist: Patient tripped and fell today, EXAM: CT HEAD WITHOUT CONTRAST CT CERVICAL SPINE WITHOUT CONTRAST TECHNIQUE: Multidetector CT imaging of the head and cervical spine was performed following the standard protocol without intravenous contrast. Multiplanar CT image reconstructions of the cervical spine were also generated. COMPARISON:  Report from brain MRI 10/24/2016 (images unavailable) FINDINGS: CT HEAD FINDINGS Brain: No evidence of acute intracranial hemorrhage. No demarcated cortical infarction. No evidence of intracranial mass. No midline shift or extra-axial fluid collection. Chronic lacunar infarct within the white matter along the lateral aspect of  the left caudate head (series 2, image 14). Background mild ill-defined hypoattenuation within the cerebral white matter is nonspecific, but consistent with chronic small vessel ischemic disease. Moderate generalized parenchymal atrophy, advanced for age. Vascular: No hyperdense vessel.  Atherosclerotic calcifications Skull: Normal. Negative for fracture or focal lesion. Sinuses/Orbits: Visualized orbits demonstrate no acute abnormality. No significant paranasal sinus disease or mastoid effusion at the imaged levels. Other: Soft tissue induration along the occipital calvarium and within the suboccipital region (greater on the left) which may reflect hematomas. CT CERVICAL SPINE FINDINGS Alignment: Straightening of the expected cervical lordosis. No significant spondylolisthesis. Skull base and vertebrae: The  basion-dental and atlanto-dental intervals are maintained.No evidence of acute fracture to the cervical spine. Soft tissues and spinal canal: No prevertebral fluid or swelling. No visible canal hematoma. Disc levels: No significant bony spinal canal or neural foraminal narrowing at any level. Upper chest: Mild right apical pleuroparenchymal scarring. 8 mm pleural based nodule within the right lung apex (series 5, image 13). IMPRESSION: CT head: 1. No evidence of acute intracranial abnormality. 2. Foci of induration within the occipital and suboccipital scalp which may reflect small hematomas. Clinical correlation is recommended. 3. Mild chronic small vessel ischemic disease. 4. Moderate generalized parenchymal atrophy, advanced for age. CT cervical spine: 1. No evidence of acute fracture to the cervical spine. 2. 8 mm pleural based nodule within the right lung apex. Non-contrast chest CT at 6-12 months is recommended. If the nodule is stable at time of repeat CT, then future CT at 18-24 months (from today's scan) is considered optional for low-risk patients, but is recommended for high-risk patients. This recommendation follows the consensus statement: Guidelines for Management of Incidental Pulmonary Nodules Detected on CT Images: From the Fleischner Society 2017; Radiology 2017; 284:228-243. Electronically Signed   By: Kellie Simmering DO   On: 10/14/2019 13:46   CT Cervical Spine Wo Contrast  Result Date: 10/14/2019 CLINICAL DATA:  Head trauma, headache. Poly trauma, critical, head/cervical spine injury suspected. Additional history provided by technologist: Patient tripped and fell today, EXAM: CT HEAD WITHOUT CONTRAST CT CERVICAL SPINE WITHOUT CONTRAST TECHNIQUE: Multidetector CT imaging of the head and cervical spine was performed following the standard protocol without intravenous contrast. Multiplanar CT image reconstructions of the cervical spine were also generated. COMPARISON:  Report from brain MRI  10/24/2016 (images unavailable) FINDINGS: CT HEAD FINDINGS Brain: No evidence of acute intracranial hemorrhage. No demarcated cortical infarction. No evidence of intracranial mass. No midline shift or extra-axial fluid collection. Chronic lacunar infarct within the white matter along the lateral aspect of the left caudate head (series 2, image 14). Background mild ill-defined hypoattenuation within the cerebral white matter is nonspecific, but consistent with chronic small vessel ischemic disease. Moderate generalized parenchymal atrophy, advanced for age. Vascular: No hyperdense vessel.  Atherosclerotic calcifications Skull: Normal. Negative for fracture or focal lesion. Sinuses/Orbits: Visualized orbits demonstrate no acute abnormality. No significant paranasal sinus disease or mastoid effusion at the imaged levels. Other: Soft tissue induration along the occipital calvarium and within the suboccipital region (greater on the left) which may reflect hematomas. CT CERVICAL SPINE FINDINGS Alignment: Straightening of the expected cervical lordosis. No significant spondylolisthesis. Skull base and vertebrae: The basion-dental and atlanto-dental intervals are maintained.No evidence of acute fracture to the cervical spine. Soft tissues and spinal canal: No prevertebral fluid or swelling. No visible canal hematoma. Disc levels: No significant bony spinal canal or neural foraminal narrowing at any level. Upper chest: Mild right apical pleuroparenchymal scarring. 8 mm  pleural based nodule within the right lung apex (series 5, image 13). IMPRESSION: CT head: 1. No evidence of acute intracranial abnormality. 2. Foci of induration within the occipital and suboccipital scalp which may reflect small hematomas. Clinical correlation is recommended. 3. Mild chronic small vessel ischemic disease. 4. Moderate generalized parenchymal atrophy, advanced for age. CT cervical spine: 1. No evidence of acute fracture to the cervical spine.  2. 8 mm pleural based nodule within the right lung apex. Non-contrast chest CT at 6-12 months is recommended. If the nodule is stable at time of repeat CT, then future CT at 18-24 months (from today's scan) is considered optional for low-risk patients, but is recommended for high-risk patients. This recommendation follows the consensus statement: Guidelines for Management of Incidental Pulmonary Nodules Detected on CT Images: From the Fleischner Society 2017; Radiology 2017; 284:228-243. Electronically Signed   By: Kellie Simmering DO   On: 10/14/2019 13:46   US Venous Img Lower Unilateral Left (DVT)  Result Date: 10/14/2019 CLINICAL DATA:  Lower extremity edema EXAM: LEFT LOWER EXTREMITY VENOUS DUPLEX ULTRASOUND TECHNIQUE: Gray-scale sonography with graded compression, as well as color Doppler and duplex ultrasound were performed to evaluate the left lower extremity deep venous system from the level of the common femoral vein and including the common femoral, femoral, profunda femoral, popliteal and calf veins including the posterior tibial, peroneal and gastrocnemius veins when visible. The superficial great saphenous vein was also interrogated. Spectral Doppler was utilized to evaluate flow at rest and with distal augmentation maneuvers in the common femoral, femoral and popliteal veins. COMPARISON:  None. FINDINGS: Contralateral Common Femoral Vein: Respiratory phasicity is normal and symmetric with the symptomatic side. No evidence of thrombus. Normal compressibility. Common Femoral Vein: No evidence of thrombus. Normal compressibility, respiratory phasicity and response to augmentation. Saphenofemoral Junction: No evidence of thrombus. Normal compressibility and flow on color Doppler imaging. Profunda Femoral Vein: No evidence of thrombus. Normal compressibility and flow on color Doppler imaging. Femoral Vein: No evidence of thrombus. Normal compressibility, respiratory phasicity and response to augmentation.  Popliteal Vein: No evidence of thrombus. Normal compressibility, respiratory phasicity and response to augmentation. Calf Veins: No evidence of thrombus. Normal compressibility and flow on color Doppler imaging. Superficial Great Saphenous Vein: No evidence of thrombus. Normal compressibility. Venous Reflux:  None. Other Findings:  None. IMPRESSION: No evidence of deep venous thrombosis in the left lower extremity. Right common femoral vein also patent. Electronically Signed   By: Lowella Grip III M.D.   On: 10/14/2019 14:22   Scheduled Meds: . aspirin EC  81 mg Oral Daily  . atorvastatin  10 mg Oral QPM  . benztropine  0.5 mg Oral BID  . colchicine  0.6 mg Oral Daily  . divalproex  500 mg Oral QHS  . heparin  5,000 Units Subcutaneous Q8H  . insulin glargine  14 Units Subcutaneous QHS  . lactulose  20 g Oral BID  . lisinopril  20 mg Oral Daily  . loratadine  10 mg Oral QPM  . QUEtiapine  200 mg Oral QPM  . risperidone  1 mg Oral BID  . senna-docusate  1 tablet Oral BID  . umeclidinium bromide  1 puff Inhalation Daily  . umeclidinium bromide      . [START ON 10/16/2019] Vitamin D (Ergocalciferol)  50,000 Units Oral Q7 days   Continuous Infusions: . cefTRIAXone (ROCEPHIN)  IV Stopped (10/14/19 1952)    Active Problems:   Essential hypertension   Encephalopathy acute   Schizophrenia (  [place])   Acute encephalopathy   Time spent:   Irwin Brakeman, MD [facility] 10/15/2019, 10:36 AM    LOS: 0 days  How to contact the [facility] Attending or Consulting provider [place] or covering provider during after hours [place], for this patient?  1. Check the care team in [facility] and look for a) attending/consulting [facility] provider listed and b) the [facility] team listed 2. Log into [email or web address] and use [place]'s universal password to access. If you do not have the password, please contact the hospital operator. 3. Locate the [facility] provider you are looking for under [facility] and page to a  number that you can be directly reached. 4. If you still have difficulty reaching the provider, please page the [facility] (Director on Call) for the Hospitalists listed on amion for assistance.

## 2019-10-16 LAB — GLUCOSE, CAPILLARY
Glucose-Capillary: 187 mg/dL — ABNORMAL HIGH (ref 70–99)
Glucose-Capillary: 149 mg/dL — ABNORMAL HIGH (ref 70–99)
Glucose-Capillary: 232 mg/dL — ABNORMAL HIGH (ref 70–99)
Glucose-Capillary: 214 mg/dL — ABNORMAL HIGH (ref 70–99)
Glucose-Capillary: 251 mg/dL — ABNORMAL HIGH (ref 70–99)

## 2019-10-16 LAB — URINE CULTURE: Culture: 70000 — AB

## 2019-10-16 LAB — BASIC METABOLIC PANEL
Anion gap: 8 (ref 5–15)
BUN: 9 mg/dL (ref 6–20)
CO2: 26 mmol/L (ref 22–32)
Calcium: 9 mg/dL (ref 8.9–10.3)
Chloride: 104 mmol/L (ref 98–111)
Creatinine, Ser: 0.52 mg/dL (ref 0.44–1.00)
GFR calc Af Amer: >60 mL/min (ref >60)
GFR calc non Af Amer: >60 mL/min (ref >60)
Glucose, Bld: 159 mg/dL — ABNORMAL HIGH (ref 70–99)
Potassium: 3.9 mmol/L (ref 3.5–5.1)
Sodium: 138 mmol/L (ref 135–145)

## 2019-10-16 LAB — MAGNESIUM: Magnesium: 1.9 mg/dL (ref 1.7–2.4)

## 2019-10-16 LAB — CBC
HCT: 28.6 % — ABNORMAL LOW (ref 36.0–46.0)
Hemoglobin: 9.3 g/dL — ABNORMAL LOW (ref 12.0–15.0)
MCH: 29.7 pg (ref 26.0–34.0)
MCHC: 32.5 g/dL (ref 30.0–36.0)
MCV: 91.4 fL (ref 80.0–100.0)
Platelets: 289 10*3/uL (ref 150–400)
RBC: 3.13 MIL/uL — ABNORMAL LOW (ref 3.87–5.11)
RDW: 11.9 % (ref 11.5–15.5)
WBC: 6.8 10*3/uL (ref 4.0–10.5)
nRBC: 0 % (ref 0.0–0.2)

## 2019-10-16 MED ORDER — SIMETHICONE 40 MG/0.6ML PO SUSP
ORAL | Status: AC
  Filled 2019-10-16: qty 0.6
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
## 2019-10-16 NOTE — Plan of Care (Signed)
  Problem: Acute Rehab PT Goals(only PT should resolve) Goal: Pt Will Go Supine/Side To Sit Outcome: Progressing Flowsheets (Taken 10/16/2019 1407) Pt will go Supine/Side to Sit: Independently Goal: Patient Will Transfer Sit To/From Stand Outcome: Progressing Flowsheets (Taken 10/16/2019 1407) Patient will transfer sit to/from stand: with min guard assist Goal: Pt Will Transfer Bed To Chair/Chair To Bed Outcome: Progressing Flowsheets (Taken 10/16/2019 1407) Pt will Transfer Bed to Chair/Chair to Bed: min guard assist Goal: Pt Will Ambulate Outcome: Progressing Flowsheets (Taken 10/16/2019 1407) Pt will Ambulate:  100 feet  with min guard assist  with cane Note: Hand held assist   2:08 PM, 10/16/19 Lonell Grandchild, MPT Physical Therapist with [facility] [phone] office [phone] mobile phone
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
## 2019-10-17 LAB — GLUCOSE, CAPILLARY
Glucose-Capillary: 197 mg/dL — ABNORMAL HIGH (ref 70–99)
Glucose-Capillary: 289 mg/dL — ABNORMAL HIGH (ref 70–99)

## 2019-10-17 LAB — OCCULT BLOOD X 1 CARD TO LAB, STOOL
Fecal Occult Bld: NEGATIVE
Fecal Occult Bld: NEGATIVE

## 2019-10-17 MED ORDER — QUETIAPINE FUMARATE ER 200 MG PO TB24: 200.0000 mg | ORAL_TABLET | Freq: Every evening | ORAL | 2 refills | Status: DC

## 2019-10-17 MED ORDER — DIVALPROEX SODIUM ER 500 MG PO TB24: 500.0000 mg | ORAL_TABLET | Freq: Every day | ORAL | 0 refills | Status: DC

## 2019-10-17 MED ORDER — RISPERIDONE 1 MG PO TABS: 1.0000 mg | ORAL_TABLET | Freq: Two times a day (BID) | ORAL | 1 refills | Status: DC

## 2019-10-17 MED ORDER — INSULIN GLARGINE 100 UNIT/ML ~~LOC~~ SOLN: 14.0000 [IU] | Freq: Every day | SUBCUTANEOUS | 11 refills | Status: DC
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
## 2019-10-17 NOTE — Progress Notes (Signed)
Discharge instructions reviewed with patient's legal guardian and discussed with patient. Discharge instructions given to patient's legal guardian in packet. Patient transported to door to [place] (legal guardian).

## 2019-10-17 NOTE — NC FL2 (Signed)
[place] LEVEL OF CARE SCREENING TOOL     IDENTIFICATION  Patient Name: Regina Watson Birthdate: Feb 03, 1966 Sex: female Admission Date (Current Location): 10/14/2019  [facility] and [place] Number:  [employer] and Address:  [facility] [address] [place]      Provider Number: [id number]  Attending Physician Name and Address:  Rodena Goldmann, DO  Relative Name and Phone Number:       Current Level of Care: Hospital Recommended Level of Care: [facility] Prior Approval Number:    Date Approved/Denied:   PASRR Number:    Discharge Plan: Domiciliary (Rest home)    Current Diagnoses: Patient Active Problem List   Diagnosis Date Noted  . UTI (urinary tract infection) 10/15/2019  . Encephalopathy acute 10/14/2019  . Schizophrenia ([place]) 10/14/2019  . Type 2 diabetes mellitus without complication ([place]) [id number]  . Acute encephalopathy 10/14/2019  . Palpitations 10/04/2016  . Essential hypertension 10/04/2016  . Tobacco use 10/04/2016  . Tachycardia 10/04/2016    Orientation RESPIRATION BLADDER Height & Weight     Self, Situation, Place  Normal Continent Weight: 82 kg Height:  5\' 9"  (175.3 cm)  BEHAVIORAL SYMPTOMS/MOOD NEUROLOGICAL BOWEL NUTRITION STATUS      Continent Diet(carb modified)  AMBULATORY STATUS COMMUNICATION OF NEEDS Skin   Supervision Verbally Normal                       Personal Care Assistance Level of Assistance  Bathing, Feeding, Dressing Bathing Assistance: Maximum assistance Feeding assistance: Limited assistance Dressing Assistance: Maximum assistance     Functional Limitations Info  Sight, Hearing, Speech Sight Info: Impaired Hearing Info: Adequate Speech Info: Adequate    SPECIAL CARE FACTORS FREQUENCY                       Contractures Contractures Info: Not present    Additional Factors Info  Code Status, Allergies, Psychotropic, Insulin Sliding Scale  Code Status Info: full Allergies Info: bactrim, Penicillins, Latex Psychotropic Info: Seroquel, Risperdol Insulin Sliding Scale Info: Inject Novolog  11-16 units into the skin 3 times daily with meals based on blood gluscose as follows: 150-200=11 units, 201-250=12 units, 251-300=13 units, 301-350=14 units, 351-400=15 units, >400=16 units, call MD if less than 70 or greater than 300 for 3 tests in a row       Current Medications (10/17/2019):  This is the current hospital active medication list Current Facility-Administered Medications  Medication Dose Route Frequency Provider Last Rate Last Admin  . acetaminophen (TYLENOL) tablet 650 mg  650 mg Oral Q4H PRN Elgergawy, Silver Huguenin, MD      . aspirin EC tablet 81 mg  81 mg Oral Daily Elgergawy, Silver Huguenin, MD   81 mg at 10/17/19 0932  . atorvastatin (LIPITOR) tablet 10 mg  10 mg Oral QPM Elgergawy, Silver Huguenin, MD   10 mg at 10/16/19 1630  . benztropine (COGENTIN) tablet 0.5 mg  0.5 mg Oral BID Elgergawy, Silver Huguenin, MD   0.5 mg at 10/17/19 0935  . colchicine tablet 0.6 mg  0.6 mg Oral Daily Elgergawy, Silver Huguenin, MD   0.6 mg at 10/17/19 0935  . divalproex (DEPAKOTE ER) 24 hr tablet 500 mg  500 mg Oral QHS Elgergawy, Silver Huguenin, MD   500 mg at 10/16/19 2143  . heparin injection 5,000 Units  5,000 Units Subcutaneous Q8H Elgergawy, Silver Huguenin, MD   5,000 Units at 10/17/19  [id number]  . insulin glargine (LANTUS) injection 14 Units  14 Units Subcutaneous QHS Elgergawy, Silver Huguenin, MD   14 Units at 10/16/19 2146  . lactulose (CHRONULAC) 10 GM/15ML solution 20 g  20 g Oral BID Elgergawy, Silver Huguenin, MD   20 g at 10/17/19 0936  . lisinopril (ZESTRIL) tablet 20 mg  20 mg Oral Daily Elgergawy, Silver Huguenin, MD   20 mg at 10/17/19 0935  . loratadine (CLARITIN) tablet 10 mg  10 mg Oral QPM Elgergawy, Silver Huguenin, MD   10 mg at 10/16/19 1630  . QUEtiapine (SEROQUEL XR) 24 hr tablet 200 mg  200 mg Oral QPM Elgergawy, Silver Huguenin, MD   200 mg at 10/16/19 1630  . risperiDONE (RISPERDAL) tablet 1  mg  1 mg Oral BID Elgergawy, Silver Huguenin, MD   1 mg at 10/17/19 0936  . senna-docusate (Senokot-S) tablet 1 tablet  1 tablet Oral BID Elgergawy, Silver Huguenin, MD   1 tablet at 10/17/19 [phone]  . umeclidinium bromide (INCRUSE ELLIPTA) 62.5 MCG/INH 1 puff  1 puff Inhalation Daily Skeet Simmer, RPH   1 puff at 10/17/19 [id number]  . Vitamin D (Ergocalciferol) (DRISDOL) capsule 50,000 Units  50,000 Units Oral Q7 days Elgergawy, Silver Huguenin, MD   50,000 Units at 10/16/19 0940     Discharge Medications: Medication List    STOP taking these medications   gabapentin 400 MG capsule Commonly known as: NEURONTIN     TAKE these medications   acetaminophen 325 MG tablet Commonly known as: TYLENOL Take 650 mg by mouth every 4 (four) hours as needed for mild pain or moderate pain.   aspirin EC 81 MG tablet Take 81 mg by mouth every morning.   atorvastatin 10 MG tablet Commonly known as: LIPITOR Take 10 mg by mouth every evening.   benztropine 0.5 MG tablet Commonly known as: COGENTIN Take 0.5 mg by mouth 2 (two) times daily.   clobetasol 0.05 % Gel Commonly known as: TEMOVATE Apply 1 application topically 2 (two) times daily.   colchicine 0.6 MG tablet Take 0.6 mg by mouth daily.   divalproex 500 MG 24 hr tablet Commonly known as: DEPAKOTE ER Take 1 tablet (500 mg total) by mouth at bedtime. What changed: how much to take   hydrOXYzine 25 MG tablet Commonly known as: ATARAX/VISTARIL Take 25 mg by mouth 3 (three) times daily.   insulin glargine 100 UNIT/ML injection Commonly known as: LANTUS Inject 0.14 mLs (14 Units total) into the skin at bedtime. What changed: how much to take   lactulose 10 GM/15ML solution Commonly known as: CHRONULAC Take 20 g by mouth 2 (two) times daily.   levocetirizine 5 MG tablet Commonly known as: XYZAL Take 5 mg by mouth every evening.   lisinopril 20 MG tablet Commonly known as: ZESTRIL Take 20 mg by mouth daily.   metFORMIN 1000 MG  tablet Commonly known as: GLUCOPHAGE Take 1,000 mg by mouth 2 (two) times daily with a meal.   Minoxidil 5 % Foam Apply 1 application topically in the morning and at bedtime.   nicotine polacrilex 4 MG gum Commonly known as: NICORETTE Take 4 mg by mouth See admin instructions. Chew one piece 24 times per day for 30 minutes as needed   NovoLOG FlexPen 100 UNIT/ML FlexPen Generic drug: insulin aspart Inject 11-16 Units into the skin 3 (three) times daily with meals. SLIDING SCALE: 150-200= 11 units 201-250= 12 units 251-300= 13 units 301-350= 14 units 351-400= 15 units >400=  16 units Call if less than 70 or greater than 300 for 3 tests  In a row   QUEtiapine 200 MG 24 hr tablet Commonly known as: SEROQUEL XR Take 1 tablet (200 mg total) by mouth every evening. What changed:   medication strength  how much to take   risperiDONE 1 MG tablet Commonly known as: RISPERDAL Take 1 tablet (1 mg total) by mouth 2 (two) times daily. What changed:   medication strength  how much to take   sennosides-docusate sodium 8.6-50 MG tablet Commonly known as: SENOKOT-S Take 1 tablet by mouth in the morning and at bedtime.   tiotropium 18 MCG inhalation capsule Commonly known as: SPIRIVA Place 18 mcg into inhaler and inhale daily.   triamcinolone ointment 0.1 % Commonly known as: KENALOG Apply 1 application topically 2 (two) times daily.   Vitamin D (Ergocalciferol) 1.25 MG (50000 UNIT) Caps capsule Commonly known as: DRISDOL Take 50,000 Units by mouth every 7 (seven) days.        Relevant Imaging Results:  Relevant Lab Results:   Additional Information    Roena Sassaman, Chauncey Reading, RN

## 2019-10-17 NOTE — Discharge Summary (Signed)
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 A1c No results for input(s): HGBA1C in the last 72 hours. Lipid Profile No results for input(s): CHOL, HDL, LDLCALC, TRIG, CHOLHDL, LDLDIRECT in the last 72 hours. Thyroid function studies Recent Labs    10/14/19 1155  TSH 0.670   Anemia work up Recent Labs    10/15/19 1230  VITAMINB12 449  FOLATE 8.7  FERRITIN 257  TIBC 242*  IRON 57  RETICCTPCT 1.2   Urinalysis    Component Value Date/Time   COLORURINE YELLOW 10/14/2019 1310   APPEARANCEUR HAZY (A) 10/14/2019 1310   LABSPEC 1.015 10/14/2019 1310   PHURINE 6.0 10/14/2019 1310   GLUCOSEU NEGATIVE 10/14/2019 1310   HGBUR NEGATIVE 10/14/2019 1310   BILIRUBINUR NEGATIVE 10/14/2019 1310   KETONESUR 5 (A) 10/14/2019 1310   PROTEINUR NEGATIVE 10/14/2019 1310   UROBILINOGEN 0.2 12/09/2013 2250   NITRITE NEGATIVE 10/14/2019 1310   LEUKOCYTESUR TRACE (A)  10/14/2019 1310   Sepsis Labs Invalid input(s): PROCALCITONIN,  WBC,  LACTICIDVEN Microbiology Recent Results (from the past 240 hour(s))  Urine culture     Status: Abnormal   Collection Time: 10/14/19  1:10 PM   Specimen: Urine, Clean Catch  Result Value Ref Range Status   Specimen Description   Final    URINE, CLEAN CATCH Performed at [facility], [address], [place], [place] [address]    Special Requests   Final    NONE Performed at [facility], [address] [place], [place] [address]    Culture 70,000 COLONIES/mL KLEBSIELLA PNEUMONIAE (A)  Final   Report Status 10/16/2019 FINAL  Final   Organism ID, Bacteria KLEBSIELLA PNEUMONIAE (A)  Final      Susceptibility   Klebsiella pneumoniae - MIC*    AMPICILLIN >=32 RESISTANT Resistant     CEFAZOLIN <=4 SENSITIVE Sensitive     CEFTRIAXONE <=0.25 SENSITIVE Sensitive     CIPROFLOXACIN <=0.25 SENSITIVE Sensitive     GENTAMICIN <=1 SENSITIVE Sensitive     IMIPENEM <=0.25 SENSITIVE Sensitive     NITROFURANTOIN 64 INTERMEDIATE Intermediate     TRIMETH/SULFA <=20 SENSITIVE Sensitive     AMPICILLIN/SULBACTAM >=32 RESISTANT Resistant     PIP/TAZO 16 SENSITIVE Sensitive     * 70,000 COLONIES/mL KLEBSIELLA PNEUMONIAE  SARS CORONAVIRUS 2 (TAT 6-24 HRS) Nasopharyngeal Nasopharyngeal Swab     Status: None   Collection Time: 10/14/19  5:35 PM   Specimen: Nasopharyngeal Swab  Result Value Ref Range Status   SARS Coronavirus 2 NEGATIVE NEGATIVE Final    Comment: (NOTE) SARS-CoV-2 target nucleic acids are NOT DETECTED. The SARS-CoV-2 RNA is generally detectable in upper and lower respiratory specimens during the acute phase of infection. Negative results do not preclude SARS-CoV-2 infection, do not rule out co-infections with other pathogens, and should not be used as the sole basis for treatment or other patient management decisions. Negative results must be combined with clinical observations, patient history, and epidemiological  information. The expected result is Negative. Fact Sheet for Patients: SugarRoll.be Fact Sheet for Healthcare Providers: https://www.woods-mathews.com/ This test is not yet approved or cleared by the [place] FDA and  has been authorized for detection and/or diagnosis of SARS-CoV-2 by FDA under an Emergency Use Authorization (EUA). This EUA will remain  in effect (meaning this test can be used) for the duration of the COVID-19 declaration  under Section 56 4(b)(1) of the Act, 21 U.S.C. section 360bbb-3(b)(1), unless the authorization is terminated or revoked sooner. Performed at [facility], [place] [address], [place], [place] [address]   MRSA PCR Screening     Status: None   Collection Time: 10/14/19  7:49 PM   Specimen: Nasopharyngeal  Result Value Ref Range Status   MRSA by PCR NEGATIVE NEGATIVE Final    Comment:        The GeneXpert MRSA Assay (FDA approved for NASAL specimens only), is one component of a comprehensive MRSA colonization surveillance program. It is not intended to diagnose MRSA infection nor to guide or monitor treatment for MRSA infections. Performed at [facility], [address], [place], [place] [address]      Time coordinating discharge: 35 minutes  SIGNED:   Rodena Goldmann, DO [facility] 10/17/2019, 11:01 AM  If 7PM-7AM, please contact night-coverage [email or web address]

## 2020-09-29 ENCOUNTER — Other Ambulatory Visit: Payer: Medicaid Other

## 2020-09-29 ENCOUNTER — Other Ambulatory Visit: Payer: Self-pay

## 2020-09-30 ENCOUNTER — Other Ambulatory Visit: Payer: Medicaid Other

## 2020-09-30 ENCOUNTER — Encounter: Payer: Medicaid Other | Admitting: Adult Health

## 2020-10-06 ENCOUNTER — Encounter: Payer: Medicaid Other | Admitting: Obstetrics & Gynecology

## 2020-10-14 ENCOUNTER — Emergency Department (HOSPITAL_COMMUNITY): Payer: Medicaid Other

## 2020-10-14 ENCOUNTER — Inpatient Hospital Stay (HOSPITAL_COMMUNITY)
Admission: EM | Admit: 2020-10-14 | Discharge: 2020-10-17 | DRG: 689 | Disposition: A | Discharge status: Skilled Nursing Facility | Payer: Medicaid Other | Source: Skilled Nursing Facility | Attending: Internal Medicine | Admitting: Internal Medicine

## 2020-10-14 DIAGNOSIS — I10 Essential (primary) hypertension: Secondary | ICD-10-CM | POA: Diagnosis present

## 2020-10-14 DIAGNOSIS — R1011 Right upper quadrant pain: Secondary | ICD-10-CM

## 2020-10-14 DIAGNOSIS — G928 Other toxic encephalopathy: Secondary | ICD-10-CM | POA: Diagnosis present

## 2020-10-14 DIAGNOSIS — F209 Schizophrenia, unspecified: Secondary | ICD-10-CM | POA: Diagnosis present

## 2020-10-14 DIAGNOSIS — B961 Klebsiella pneumoniae [K. pneumoniae] as the cause of diseases classified elsewhere: Secondary | ICD-10-CM | POA: Diagnosis present

## 2020-10-14 DIAGNOSIS — Z7982 Long term (current) use of aspirin: Secondary | ICD-10-CM

## 2020-10-14 DIAGNOSIS — H409 Unspecified glaucoma: Secondary | ICD-10-CM | POA: Diagnosis present

## 2020-10-14 DIAGNOSIS — N390 Urinary tract infection, site not specified: Principal | ICD-10-CM | POA: Diagnosis present

## 2020-10-14 DIAGNOSIS — Z880 Allergy status to penicillin: Secondary | ICD-10-CM

## 2020-10-14 DIAGNOSIS — Z833 Family history of diabetes mellitus: Secondary | ICD-10-CM

## 2020-10-14 DIAGNOSIS — E1165 Type 2 diabetes mellitus with hyperglycemia: Secondary | ICD-10-CM

## 2020-10-14 DIAGNOSIS — E669 Obesity, unspecified: Secondary | ICD-10-CM | POA: Diagnosis present

## 2020-10-14 DIAGNOSIS — Z79899 Other long term (current) drug therapy: Secondary | ICD-10-CM

## 2020-10-14 DIAGNOSIS — E1140 Type 2 diabetes mellitus with diabetic neuropathy, unspecified: Secondary | ICD-10-CM

## 2020-10-14 DIAGNOSIS — Z86718 Personal history of other venous thrombosis and embolism: Secondary | ICD-10-CM

## 2020-10-14 DIAGNOSIS — Z6830 Body mass index (BMI) 30.0-30.9, adult: Secondary | ICD-10-CM

## 2020-10-14 DIAGNOSIS — D72829 Elevated white blood cell count, unspecified: Secondary | ICD-10-CM

## 2020-10-14 DIAGNOSIS — K5900 Constipation, unspecified: Secondary | ICD-10-CM | POA: Diagnosis present

## 2020-10-14 DIAGNOSIS — Z91040 Latex allergy status: Secondary | ICD-10-CM

## 2020-10-14 DIAGNOSIS — E1136 Type 2 diabetes mellitus with diabetic cataract: Secondary | ICD-10-CM | POA: Diagnosis present

## 2020-10-14 DIAGNOSIS — Z888 Allergy status to other drugs, medicaments and biological substances status: Secondary | ICD-10-CM

## 2020-10-14 DIAGNOSIS — Z20822 Contact with and (suspected) exposure to covid-19: Secondary | ICD-10-CM | POA: Diagnosis present

## 2020-10-14 DIAGNOSIS — Z8249 Family history of ischemic heart disease and other diseases of the circulatory system: Secondary | ICD-10-CM

## 2020-10-14 DIAGNOSIS — H548 Legal blindness, as defined in USA: Secondary | ICD-10-CM | POA: Diagnosis present

## 2020-10-14 DIAGNOSIS — F17210 Nicotine dependence, cigarettes, uncomplicated: Secondary | ICD-10-CM | POA: Diagnosis present

## 2020-10-14 DIAGNOSIS — E785 Hyperlipidemia, unspecified: Secondary | ICD-10-CM

## 2020-10-14 DIAGNOSIS — G9341 Metabolic encephalopathy: Secondary | ICD-10-CM | POA: Diagnosis present

## 2020-10-14 DIAGNOSIS — Z794 Long term (current) use of insulin: Secondary | ICD-10-CM

## 2020-10-14 DIAGNOSIS — N179 Acute kidney failure, unspecified: Secondary | ICD-10-CM

## 2020-10-14 DIAGNOSIS — N3000 Acute cystitis without hematuria: Secondary | ICD-10-CM

## 2020-10-14 DIAGNOSIS — F329 Major depressive disorder, single episode, unspecified: Secondary | ICD-10-CM | POA: Diagnosis present

## 2020-10-14 DIAGNOSIS — R4182 Altered mental status, unspecified: Secondary | ICD-10-CM | POA: Diagnosis present

## 2020-10-14 LAB — URINALYSIS, COMPLETE (UACMP) WITH MICROSCOPIC
Bilirubin Urine: NEGATIVE
Glucose, UA: NEGATIVE mg/dL
Hgb urine dipstick: NEGATIVE
Ketones, ur: 5 mg/dL — AB
Nitrite: NEGATIVE
Protein, ur: NEGATIVE mg/dL
Specific Gravity, Urine: 1.013 (ref 1.005–1.030)
pH: 6 (ref 5.0–8.0)

## 2020-10-14 LAB — COMPREHENSIVE METABOLIC PANEL
ALT: 27 U/L (ref 0–44)
AST: 28 U/L (ref 15–41)
Albumin: 4.3 g/dL (ref 3.5–5.0)
Alkaline Phosphatase: 91 U/L (ref 38–126)
Anion gap: 14 (ref 5–15)
BUN: 45 mg/dL — ABNORMAL HIGH (ref 6–20)
CO2: 17 mmol/L — ABNORMAL LOW (ref 22–32)
Calcium: 10.2 mg/dL (ref 8.9–10.3)
Chloride: 104 mmol/L (ref 98–111)
Creatinine, Ser: 1.53 mg/dL — ABNORMAL HIGH (ref 0.44–1.00)
GFR, Estimated: 40 mL/min — ABNORMAL LOW (ref >60)
Glucose, Bld: 231 mg/dL — ABNORMAL HIGH (ref 70–99)
Potassium: 4.5 mmol/L (ref 3.5–5.1)
Sodium: 135 mmol/L (ref 135–145)
Total Bilirubin: 0.5 mg/dL (ref 0.3–1.2)
Total Protein: 8.7 g/dL — ABNORMAL HIGH (ref 6.5–8.1)

## 2020-10-14 LAB — CBC WITH DIFFERENTIAL/PLATELET
Abs Immature Granulocytes: 0.08 10*3/uL — ABNORMAL HIGH (ref 0.00–0.07)
Basophils Absolute: 0 10*3/uL (ref 0.0–0.1)
Basophils Relative: 1 %
Eosinophils Absolute: 0.2 10*3/uL (ref 0.0–0.5)
Eosinophils Relative: 3 %
HCT: 48.8 % — ABNORMAL HIGH (ref 36.0–46.0)
Hemoglobin: 15.4 g/dL — ABNORMAL HIGH (ref 12.0–15.0)
Immature Granulocytes: 2 %
Lymphocytes Relative: 34 %
Lymphs Abs: 1.8 10*3/uL (ref 0.7–4.0)
MCH: 30.6 pg (ref 26.0–34.0)
MCHC: 31.6 g/dL (ref 30.0–36.0)
MCV: 96.8 fL (ref 80.0–100.0)
Monocytes Absolute: 0.5 10*3/uL (ref 0.1–1.0)
Monocytes Relative: 9 %
Neutro Abs: 2.7 10*3/uL (ref 1.7–7.7)
Neutrophils Relative %: 51 %
Platelets: 238 10*3/uL (ref 150–400)
RBC: 5.04 MIL/uL (ref 3.87–5.11)
RDW: 12.8 % (ref 11.5–15.5)
WBC: 5.2 10*3/uL (ref 4.0–10.5)
nRBC: 0 % (ref 0.0–0.2)

## 2020-10-14 LAB — RAPID URINE DRUG SCREEN, HOSP PERFORMED
Amphetamines: NOT DETECTED
Barbiturates: NOT DETECTED
Benzodiazepines: NOT DETECTED
Cocaine: NOT DETECTED
Opiates: NOT DETECTED
Tetrahydrocannabinol: NOT DETECTED

## 2020-10-14 LAB — BETA-HYDROXYBUTYRIC ACID: Beta-Hydroxybutyric Acid: 0.31 mmol/L — ABNORMAL HIGH (ref 0.05–0.27)

## 2020-10-14 LAB — POC URINE PREG, ED: Preg Test, Ur: NEGATIVE

## 2020-10-14 LAB — CBG MONITORING, ED
Glucose-Capillary: 271 mg/dL — ABNORMAL HIGH (ref 70–99)
Glucose-Capillary: 200 mg/dL — ABNORMAL HIGH (ref 70–99)

## 2020-10-14 LAB — BRAIN NATRIURETIC PEPTIDE: B Natriuretic Peptide: 21 pg/mL (ref 0.0–100.0)

## 2020-10-14 LAB — AMMONIA: Ammonia: 25 umol/L (ref 9–35)

## 2020-10-14 MED ORDER — SODIUM CHLORIDE 0.9 % IV BOLUS
1000.0000 mL | Freq: Once | INTRAVENOUS | Status: AC
Start: 2020-10-14 — End: 2020-10-14
  Administered 2020-10-14: 1000 mL via INTRAVENOUS

## 2020-10-14 MED ORDER — SODIUM CHLORIDE 0.9 % IV BOLUS
1000.0000 mL | Freq: Once | INTRAVENOUS | Status: AC
  Administered 2020-10-14: 1000 mL via INTRAVENOUS

## 2020-10-14 MED ORDER — SODIUM CHLORIDE 0.9 % IV SOLN: INTRAVENOUS | Status: DC

## 2020-10-14 MED ORDER — SODIUM CHLORIDE 0.9 % IV SOLN
1.0000 g | Freq: Once | INTRAVENOUS | Status: AC
  Administered 2020-10-14: 1 g via INTRAVENOUS
  Filled 2020-10-14: qty 10

## 2020-10-14 NOTE — ED Provider Notes (Signed)
Care assumed from [facility], [place], at shift change, please see their notes for full documentation of patient's complaint/HPI. Briefly, pt here with [facility]MS from [facility]. Results so far show normal CT head, CXR with vascular congestion (no history of CHF), CMP with creatinine 1.53, glucose 231, and bicarb 17; no gap. Awaiting VBG,beta hydroxybutyric acid, BNP. Plan is to admit patient for altered mental status;  History of acute metabolic encephalopathy.   Physical Exam  BP 124/76   Pulse (!) 105   Temp 98.1 F (36.7 C) (Axillary)   Resp 12   SpO2 100%   Physical Exam Vitals and nursing note reviewed.  Constitutional:      Appearance: She is not ill-appearing.  HENT:     Head: Normocephalic and atraumatic.  Eyes:     Conjunctiva/sclera: Conjunctivae normal.  Cardiovascular:     Rate and Rhythm: Normal rate and regular rhythm.  Pulmonary:     Effort: Pulmonary effort is normal.     Breath sounds: Normal breath sounds.  Skin:    General: Skin is warm and dry.     Coloration: Skin is not jaundiced.  Neurological:     Mental Status: She is alert.     ED Course/Procedures     Procedures  Results for orders placed or performed during the hospital encounter of 10/14/20  Comprehensive metabolic panel  Result Value Ref Range   Sodium 135 135 - 145 mmol/L   Potassium 4.5 3.5 - 5.1 mmol/L   Chloride 104 98 - 111 mmol/L   CO2 17 (L) 22 - 32 mmol/L   Glucose, Bld 231 (H) 70 - 99 mg/dL   BUN 45 (H) 6 - 20 mg/dL   Creatinine, Ser 1.53 (H) 0.44 - 1.00 mg/dL   Calcium 10.2 8.9 - 10.3 mg/dL   Total Protein 8.7 (H) 6.5 - 8.1 g/dL   Albumin 4.3 3.5 - 5.0 g/dL   AST 28 15 - 41 U/L   ALT 27 0 - 44 U/L   Alkaline Phosphatase 91 38 - 126 U/L   Total Bilirubin 0.5 0.3 - 1.2 mg/dL   GFR, Estimated 40 (L) >60 mL/min   Anion gap 14 5 - 15  Urinalysis, Complete w Microscopic Urine, Catheterized  Result Value Ref Range   Color, Urine YELLOW YELLOW   APPearance CLEAR CLEAR    Specific Gravity, Urine 1.013 1.005 - 1.030   pH 6.0 5.0 - 8.0   Glucose, UA NEGATIVE NEGATIVE mg/dL   Hgb urine dipstick NEGATIVE NEGATIVE   Bilirubin Urine NEGATIVE NEGATIVE   Ketones, ur 5 (A) NEGATIVE mg/dL   Protein, ur NEGATIVE NEGATIVE mg/dL   Nitrite NEGATIVE NEGATIVE   Leukocytes,Ua SMALL (A) NEGATIVE   RBC / HPF 0-5 0 - 5 RBC/hpf   WBC, UA 11-20 0 - 5 WBC/hpf   Bacteria, UA RARE (A) NONE SEEN   Squamous Epithelial / LPF 0-5 0 - 5   Mucus PRESENT   Ammonia  Result Value Ref Range   Ammonia 25 9 - 35 umol/L  Beta-hydroxybutyric acid  Result Value Ref Range   Beta-Hydroxybutyric Acid 0.31 (H) 0.05 - 0.27 mmol/L  Brain natriuretic peptide  Result Value Ref Range   B Natriuretic Peptide 21.0 0.0 - 100.0 pg/mL  CBC with Differential/Platelet  Result Value Ref Range   WBC 5.2 4.0 - 10.5 K/uL   RBC 5.04 3.87 - 5.11 MIL/uL   Hemoglobin 15.4 (H) 12.0 - 15.0 g/dL   HCT 48.8 (H) 36.0 - 46.0 %  MCV 96.8 80.0 - 100.0 fL   MCH 30.6 26.0 - 34.0 pg   MCHC 31.6 30.0 - 36.0 g/dL   RDW 12.8 11.5 - 15.5 %   Platelets 238 150 - 400 K/uL   nRBC 0.0 0.0 - 0.2 %   Neutrophils Relative % 51 %   Neutro Abs 2.7 1.7 - 7.7 K/uL   Lymphocytes Relative 34 %   Lymphs Abs 1.8 0.7 - 4.0 K/uL   Monocytes Relative 9 %   Monocytes Absolute 0.5 0.1 - 1.0 K/uL   Eosinophils Relative 3 %   Eosinophils Absolute 0.2 0.0 - 0.5 K/uL   Basophils Relative 1 %   Basophils Absolute 0.0 0.0 - 0.1 K/uL   Immature Granulocytes 2 %   Abs Immature Granulocytes 0.08 (H) 0.00 - 0.07 K/uL  Urine rapid drug screen (hosp performed)  Result Value Ref Range   Opiates NONE DETECTED NONE DETECTED   Cocaine NONE DETECTED NONE DETECTED   Benzodiazepines NONE DETECTED NONE DETECTED   Amphetamines NONE DETECTED NONE DETECTED   Tetrahydrocannabinol NONE DETECTED NONE DETECTED   Barbiturates NONE DETECTED NONE DETECTED  CBG monitoring, ED  Result Value Ref Range   Glucose-Capillary 271 (H) 70 - 99 mg/dL  CBG  monitoring, ED  Result Value Ref Range   Glucose-Capillary 200 (H) 70 - 99 mg/dL  POC urine preg, ED  Result Value Ref Range   Preg Test, Ur NEGATIVE NEGATIVE   DG Chest 1 View  Result Date: 10/14/2020 CLINICAL DATA:  Altered mental status. Patient unable to provide history. Weakness. EXAM: CHEST  1 VIEW COMPARISON:  09/27/2019 FINDINGS: Heart size and mediastinal contours appear normal. Pulmonary vascular congestion noted. No pleural effusion or edema. No airspace densities. Visualized osseous structures are unremarkable. IMPRESSION: Pulmonary vascular congestion. Electronically Signed   By: Kerby Moors M.D.   On: 10/14/2020 17:08   CT HEAD WO CONTRAST  Result Date: 10/14/2020 CLINICAL DATA:  Altered mental status and lethargic EXAM: CT HEAD WITHOUT CONTRAST TECHNIQUE: Contiguous axial images were obtained from the base of the skull through the vertex without intravenous contrast. COMPARISON:  Head CT October 14, 2019 FINDINGS: Brain: No evidence of acute infarction, hemorrhage, hydrocephalus, extra-axial collection or mass lesion/mass effect. Similar moderate global parenchymal atrophy advanced for age. Mild burden of chronic small vessel ischemic white matter disease, similar prior. 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 Vascular: No hyperdense vessel or unexpected calcification. Skull: Normal. Negative for fracture or focal lesion. Sinuses/Orbits: No acute finding. Other: None. IMPRESSION: 1. No acute intracranial findings. 2. Similar moderate global parenchymal atrophy advanced for age. 3. 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 Electronically Signed   By: Dahlia Bailiff MD   On: 10/14/2020 16:52    MDM   Remainder of labwork delayed due to pt being a hard stick. Phlebotomy was able to obtain CBC and beta hydroxybutyric acid; CBC without leukocytosis. UDS negative. Pt still  needs VBG prior to admission to ensure she is not hypercarbic causing her somnolence. U/A is somewhat infectious today; urine culture sent. Rocephin started. Pt will require admission after VBG.   VBG without hypercarbia. Will admit.   Discussed case with Dr. Josephine Cables who agrees to evaluate patient for admission.   This note was prepared using Dragon voice recognition software and may include unintentional dictation errors due to the inherent limitations of voice recognition software.    Eustaquio Maize,  PA-C 10/15/20 0028    Lorelle Gibbs, DO 10/15/20 1536

## 2020-10-14 NOTE — ED Notes (Signed)
Pt to CT

## 2020-10-14 NOTE — ED Notes (Signed)
Nurse at the bedside attempting to place an IV & draw labs.

## 2020-10-14 NOTE — ED Provider Notes (Incomplete)
Care assumed from [facility], [place], at shift change, please see their notes for full documentation of patient's complaint/HPI. Briefly, pt here with [facility]MS from [facility]. Results so far show normal CT head, CXR with vascular congestion (no history of CHF), CMP with creatinine 1.53, glucose 231, and bicarb 17; no gap. Awaiting VBG,beta hydroxybutyric acid, BNP. Plan is to admit patient for altered mental status;  History of acute metabolic encephalopathy.   Physical Exam  BP 124/76   Pulse (!) 105   Temp 98.1 F (36.7 C) (Axillary)   Resp 12   SpO2 100%   Physical Exam Vitals and nursing note reviewed.  Constitutional:      Appearance: She is not ill-appearing.  HENT:     Head: Normocephalic and atraumatic.  Eyes:     Conjunctiva/sclera: Conjunctivae normal.  Cardiovascular:     Rate and Rhythm: Normal rate and regular rhythm.  Pulmonary:     Effort: Pulmonary effort is normal.     Breath sounds: Normal breath sounds.  Skin:    General: Skin is warm and dry.     Coloration: Skin is not jaundiced.  Neurological:     Mental Status: She is alert.     ED Course/Procedures     Procedures  Results for orders placed or performed during the hospital encounter of 10/14/20  Comprehensive metabolic panel  Result Value Ref Range   Sodium 135 135 - 145 mmol/L   Potassium 4.5 3.5 - 5.1 mmol/L   Chloride 104 98 - 111 mmol/L   CO2 17 (L) 22 - 32 mmol/L   Glucose, Bld 231 (H) 70 - 99 mg/dL   BUN 45 (H) 6 - 20 mg/dL   Creatinine, Ser 1.53 (H) 0.44 - 1.00 mg/dL   Calcium 10.2 8.9 - 10.3 mg/dL   Total Protein 8.7 (H) 6.5 - 8.1 g/dL   Albumin 4.3 3.5 - 5.0 g/dL   AST 28 15 - 41 U/L   ALT 27 0 - 44 U/L   Alkaline Phosphatase 91 38 - 126 U/L   Total Bilirubin 0.5 0.3 - 1.2 mg/dL   GFR, Estimated 40 (L) >60 mL/min   Anion gap 14 5 - 15  Urinalysis, Complete w Microscopic Urine, Catheterized  Result Value Ref Range   Color, Urine YELLOW YELLOW   APPearance CLEAR CLEAR    Specific Gravity, Urine 1.013 1.005 - 1.030   pH 6.0 5.0 - 8.0   Glucose, UA NEGATIVE NEGATIVE mg/dL   Hgb urine dipstick NEGATIVE NEGATIVE   Bilirubin Urine NEGATIVE NEGATIVE   Ketones, ur 5 (A) NEGATIVE mg/dL   Protein, ur NEGATIVE NEGATIVE mg/dL   Nitrite NEGATIVE NEGATIVE   Leukocytes,Ua SMALL (A) NEGATIVE   RBC / HPF 0-5 0 - 5 RBC/hpf   WBC, UA 11-20 0 - 5 WBC/hpf   Bacteria, UA RARE (A) NONE SEEN   Squamous Epithelial / LPF 0-5 0 - 5   Mucus PRESENT   Ammonia  Result Value Ref Range   Ammonia 25 9 - 35 umol/L  Beta-hydroxybutyric acid  Result Value Ref Range   Beta-Hydroxybutyric Acid 0.31 (H) 0.05 - 0.27 mmol/L  Brain natriuretic peptide  Result Value Ref Range   B Natriuretic Peptide 21.0 0.0 - 100.0 pg/mL  CBC with Differential/Platelet  Result Value Ref Range   WBC 5.2 4.0 - 10.5 K/uL   RBC 5.04 3.87 - 5.11 MIL/uL   Hemoglobin 15.4 (H) 12.0 - 15.0 g/dL   HCT 48.8 (H) 36.0 - 46.0 %  MCV 96.8 80.0 - 100.0 fL   MCH 30.6 26.0 - 34.0 pg   MCHC 31.6 30.0 - 36.0 g/dL   RDW 12.8 11.5 - 15.5 %   Platelets 238 150 - 400 K/uL   nRBC 0.0 0.0 - 0.2 %   Neutrophils Relative % 51 %   Neutro Abs 2.7 1.7 - 7.7 K/uL   Lymphocytes Relative 34 %   Lymphs Abs 1.8 0.7 - 4.0 K/uL   Monocytes Relative 9 %   Monocytes Absolute 0.5 0.1 - 1.0 K/uL   Eosinophils Relative 3 %   Eosinophils Absolute 0.2 0.0 - 0.5 K/uL   Basophils Relative 1 %   Basophils Absolute 0.0 0.0 - 0.1 K/uL   Immature Granulocytes 2 %   Abs Immature Granulocytes 0.08 (H) 0.00 - 0.07 K/uL  Urine rapid drug screen (hosp performed)  Result Value Ref Range   Opiates NONE DETECTED NONE DETECTED   Cocaine NONE DETECTED NONE DETECTED   Benzodiazepines NONE DETECTED NONE DETECTED   Amphetamines NONE DETECTED NONE DETECTED   Tetrahydrocannabinol NONE DETECTED NONE DETECTED   Barbiturates NONE DETECTED NONE DETECTED  CBG monitoring, ED  Result Value Ref Range   Glucose-Capillary 271 (H) 70 - 99 mg/dL  CBG  monitoring, ED  Result Value Ref Range   Glucose-Capillary 200 (H) 70 - 99 mg/dL  POC urine preg, ED  Result Value Ref Range   Preg Test, Ur NEGATIVE NEGATIVE   DG Chest 1 View  Result Date: 10/14/2020 CLINICAL DATA:  Altered mental status. Patient unable to provide history. Weakness. EXAM: CHEST  1 VIEW COMPARISON:  09/27/2019 FINDINGS: Heart size and mediastinal contours appear normal. Pulmonary vascular congestion noted. No pleural effusion or edema. No airspace densities. Visualized osseous structures are unremarkable. IMPRESSION: Pulmonary vascular congestion. Electronically Signed   By: Kerby Moors M.D.   On: 10/14/2020 17:08   CT HEAD WO CONTRAST  Result Date: 10/14/2020 CLINICAL DATA:  Altered mental status and lethargic EXAM: CT HEAD WITHOUT CONTRAST TECHNIQUE: Contiguous axial images were obtained from the base of the skull through the vertex without intravenous contrast. COMPARISON:  Head CT October 14, 2019 FINDINGS: Brain: No evidence of acute infarction, hemorrhage, hydrocephalus, extra-axial collection or mass lesion/mass effect. Similar moderate global parenchymal atrophy advanced for age. Mild burden of chronic small vessel ischemic white matter disease, similar prior. 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 Vascular: No hyperdense vessel or unexpected calcification. Skull: Normal. Negative for fracture or focal lesion. Sinuses/Orbits: No acute finding. Other: None. IMPRESSION: 1. No acute intracranial findings. 2. Similar moderate global parenchymal atrophy advanced for age. 3. 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 Electronically Signed   By: Dahlia Bailiff MD   On: 10/14/2020 16:52    MDM   Remainder of labwork delayed due to pt being a hard stick. Phlebotomy was able to obtain CBC and beta hydroxybutyric acid; CBC without leukocytosis. UDS negative. Pt still  needs VBG prior to admission to ensure she is not hypercarbic causing her somnolence. U/A is somewhat infectious today; urine culture sent. Rocephin started. Pt will require admission after VBG.   At shift change case signed out to oncoming provider Dr. Roxanne Mins who will admit patient once VBG returns.   This note was prepared using Dragon voice recognition software and may include unintentional dictation errors due to the inherent limitations of voice recognition software.

## 2020-10-14 NOTE — ED Triage Notes (Signed)
Presents from [facility] for AMS Reports she is lethargic compared to baseline

## 2020-10-14 NOTE — ED Provider Notes (Signed)
[facility] EMERGENCY DEPARTMENT Provider Note   CSN: [id number] Arrival date & time: 10/14/20  1457     History Chief Complaint  Patient presents with  . Altered Mental Status    Regina Watson is a 55 y.o. female with pertinent past medical history of anemia, diabetes, hypertension, MDD, schizophrenia that presents to the emergency department today for altered mental status, [facility]  Patient is able to say her name otherwise mostly nonverbal, appears very sleepy on exam.  Patient is level 5 caveat due to altered mental status.  Is able to speak to nursing staff from facility, they state that she was acting normal this morning, walking and taking her normal medications, however around lunchtime they noticed a mental status change.  He states that she was not speaking normally, very sleepy.  No change in any medications, no narcotics.  States that she was fine the day before yesterday.  No fever, nausea or vomiting.  Has not been complaining of anything.  States that she is unable to describe it more however states that she was just not" acting herself."  No falls.  HPI     Past Medical History:  Diagnosis Date  . Anemia   . Diabetes mellitus without complication ([place])   . Hypertension   . MDD (major depressive disorder)   . Neuropathy 2010  . Schizophrenia [facility])     Patient Active Problem List   Diagnosis Date Noted  . UTI (urinary tract infection) 10/15/2019  . Encephalopathy acute 10/14/2019  . Schizophrenia ([place]) 10/14/2019  . Type 2 diabetes mellitus without complication ([place]) 62/83/6629  . Acute encephalopathy 10/14/2019  . Palpitations 10/04/2016  . Essential hypertension 10/04/2016  . Tobacco use 10/04/2016  . Tachycardia 10/04/2016    Past Surgical History:  Procedure Laterality Date  . TOE AMPUTATION       OB History   No obstetric history on file.     Family History  Problem Relation Age of Onset   . Hypertension Mother   . Cancer Mother   . Diabetes Father     Social History   Tobacco Use  . Smoking status: Current Every Day Smoker    Packs/day: 1.00    Years: 20.00    Pack years: 20.00    Types: Cigarettes  . Smokeless tobacco: Never Used  Substance Use Topics  . Alcohol use: No  . Drug use: No    Home Medications Prior to Admission medications   Medication Sig Start Date End Date Taking? Authorizing Provider  acetaminophen (TYLENOL) 325 MG tablet Take 650 mg by mouth every 4 (four) hours as needed for mild pain or moderate pain.   Yes [provider]  aspirin EC 81 MG tablet Take 81 mg by mouth every morning.   Yes [provider]  atorvastatin (LIPITOR) 10 MG tablet Take 10 mg by mouth every evening.    Yes [provider]  benztropine (COGENTIN) 0.5 MG tablet Take 0.5 mg by mouth 2 (two) times daily.   Yes [provider]  clobetasol (TEMOVATE) 0.05 % GEL Apply 1 application topically 2 (two) times daily.   Yes [provider]  hydrOXYzine (ATARAX/VISTARIL) 25 MG tablet Take 25 mg by mouth 3 (three) times daily.   Yes [provider]  insulin aspart (NOVOLOG) 100 UNIT/ML FlexPen Inject 11-16 Units into the skin 3 (three) times daily with meals. SLIDING SCALE: 150-200= 11 units 201-250= 12 units 251-300= 13 units 301-350=  14 units 351-400= 15 units >400=      16 units Call if less than 70 or greater than 300 for 3 tests  In a row   Yes [provider]  insulin glargine (LANTUS) 100 UNIT/ML injection Inject 0.14 mLs (14 Units total) into the skin at bedtime. 10/17/19  Yes Shah, Pratik D, DO  lactulose (CHRONULAC) 10 GM/15ML solution Take 20 g by mouth 2 (two) times daily.   Yes [provider]  lactulose, encephalopathy, (ENULOSE) 10 GM/15ML SOLN Take 30 g by mouth 2 (two) times daily.   Yes [provider]  levocetirizine (XYZAL) 5 MG tablet Take 5 mg by mouth every evening.   Yes  [provider]  lisinopril (ZESTRIL) 20 MG tablet Take 20 mg by mouth daily.    Yes [provider]  metFORMIN (GLUCOPHAGE) 1000 MG tablet Take 1,000 mg by mouth 2 (two) times daily with a meal.   Yes [provider]  Minoxidil 5 % FOAM Apply 1 application topically in the morning and at bedtime.   Yes [provider]  MITIGARE 0.6 MG CAPS Take 1 capsule by mouth daily. 09/29/20  Yes [provider]  nicotine polacrilex (NICORETTE) 4 MG gum Take 4 mg by mouth See admin instructions. Chew one piece 24 times per day for 30 minutes as needed   Yes [provider]  QUEtiapine (SEROQUEL XR) 200 MG 24 hr tablet Take 1 tablet (200 mg total) by mouth every evening. Patient taking differently: Take 400 mg by mouth every evening. 10/17/19 11/16/19 Yes Shah, Pratik D, DO  risperiDONE (RISPERDAL) 1 MG tablet Take 1 tablet (1 mg total) by mouth 2 (two) times daily. 10/17/19 11/16/19 Yes Shah, Pratik D, DO  sennosides-docusate sodium (SENOKOT-S) 8.6-50 MG tablet Take 1 tablet by mouth in the morning and at bedtime.   Yes [provider]  timolol (TIMOPTIC) 0.5 % ophthalmic solution Place 1 drop into the left eye 2 (two) times daily. 09/09/20  Yes [provider]  tiotropium (SPIRIVA) 18 MCG inhalation capsule Place 18 mcg into inhaler and inhale daily.   Yes [provider]  triamcinolone ointment (KENALOG) 0.1 % Apply 1 application topically 2 (two) times daily.   Yes [provider]  Vitamin D, Ergocalciferol, (DRISDOL) 1.25 MG (50000 UNIT) CAPS capsule Take 50,000 Units by mouth every 7 (seven) days.   Yes [provider]  colchicine 0.6 MG tablet Take 0.6 mg by mouth daily.    [provider]  divalproex (DEPAKOTE ER) 500 MG 24 hr tablet Take 1 tablet (500 mg total) by mouth at bedtime. 10/17/19 11/16/19  Manuella Ghazi, Pratik D, DO    Allergies    Bactrim [sulfamethoxazole-trimethoprim], Penicillins, and  Latex  Review of Systems   Review of Systems  Unable to perform ROS: Mental status change    Physical Exam Updated Vital Signs BP 124/76   Pulse (!) 105   Temp 98.1 F (36.7 C) (Axillary)   Resp 12   SpO2 100%   Physical Exam Constitutional:      General: She is not in acute distress.    Appearance: Normal appearance. She is not ill-appearing, toxic-appearing or diaphoretic.     Comments: Patient is very sleepy on exam, however will respond to her name.  Patient is able to tell me where she is and who she is.  Will follow commands at times.  No acute distress.    HENT:     Head: Normocephalic and atraumatic.  Mouth/Throat:     Mouth: Mucous membranes are moist.     Pharynx: Oropharynx is clear.  Eyes:     General: No scleral icterus.    Extraocular Movements: Extraocular movements intact.     Pupils: Pupils are equal, round, and reactive to light.  Cardiovascular:     Rate and Rhythm: Normal rate and regular rhythm.     Pulses: Normal pulses.     Heart sounds: Normal heart sounds.  Pulmonary:     Effort: Pulmonary effort is normal. No respiratory distress.     Breath sounds: Normal breath sounds. No stridor. No wheezing, rhonchi or rales.  Chest:     Chest wall: No tenderness.  Abdominal:     General: Abdomen is flat. There is no distension.     Palpations: Abdomen is soft.     Tenderness: There is no abdominal tenderness. There is no guarding or rebound.  Musculoskeletal:        General: No swelling or tenderness. Normal range of motion.     Cervical back: Normal range of motion and neck supple. No rigidity.     Right lower leg: No edema.     Left lower leg: No edema.  Skin:    General: Skin is warm and dry.     Capillary Refill: Capillary refill takes less than 2 seconds.     Coloration: Skin is not pale.  Neurological:     General: No focal deficit present.     Mental Status: She is alert and oriented to person, place, and time.     Comments: Is alert and  oriented to self and place.  Unsure why she is here.  Is moving all 4 extremities, will follow some commands.  No focal neurodeficits noted.  No facial droop or slurred speech.  Patient will localize pain.  Is very sleepy on exam.  Psychiatric:        Mood and Affect: Mood normal.        Behavior: Behavior normal.     ED Results / Procedures / Treatments   Labs (all labs ordered are listed, but only abnormal results are displayed) Labs Reviewed  COMPREHENSIVE METABOLIC PANEL - Abnormal; Notable for the following components:      Result Value   CO2 17 (*)    Glucose, Bld 231 (*)    BUN 45 (*)    Creatinine, Ser 1.53 (*)    Total Protein 8.7 (*)    GFR, Estimated 40 (*)    All other components within normal limits  CBG MONITORING, ED - Abnormal; Notable for the following components:   Glucose-Capillary 271 (*)    All other components within normal limits  CBG MONITORING, ED - Abnormal; Notable for the following components:   Glucose-Capillary 200 (*)    All other components within normal limits  SARS CORONAVIRUS 2 (TAT 6-24 HRS)  AMMONIA  CBC WITH DIFFERENTIAL/PLATELET  URINALYSIS, COMPLETE (UACMP) WITH MICROSCOPIC  BLOOD GAS, VENOUS  BETA-HYDROXYBUTYRIC ACID  BRAIN NATRIURETIC PEPTIDE  POC URINE PREG, ED    EKG None  Radiology DG Chest 1 View  Result Date: 10/14/2020 CLINICAL DATA:  Altered mental status. Patient unable to provide history. Weakness. EXAM: CHEST  1 VIEW COMPARISON:  09/27/2019 FINDINGS: Heart size and mediastinal contours appear normal. Pulmonary vascular congestion noted. No pleural effusion or edema. No airspace densities. Visualized osseous structures are unremarkable. IMPRESSION: Pulmonary vascular congestion. Electronically Signed   By: Kerby Moors M.D.   On: 10/14/2020 17:08  CT HEAD WO CONTRAST  Result Date: 10/14/2020 CLINICAL DATA:  Altered mental status and lethargic EXAM: CT HEAD WITHOUT CONTRAST TECHNIQUE: Contiguous axial images were  obtained from the base of the skull through the vertex without intravenous contrast. COMPARISON:  Head CT October 14, 2019 FINDINGS: Brain: No evidence of acute infarction, hemorrhage, hydrocephalus, extra-axial collection or mass lesion/mass effect. Similar moderate global parenchymal atrophy advanced for age. Mild burden of chronic small vessel ischemic white matter disease, similar prior. 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 Vascular: No hyperdense vessel or unexpected calcification. Skull: Normal. Negative for fracture or focal lesion. Sinuses/Orbits: No acute finding. Other: None. IMPRESSION: 1. No acute intracranial findings. 2. Similar moderate global parenchymal atrophy advanced for age. 3. 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 Electronically Signed   By: Dahlia Bailiff MD   On: 10/14/2020 16:52    Procedures Procedures   Medications Ordered in ED Medications  sodium chloride 0.9 % bolus 1,000 mL (0 mLs Intravenous Stopped 10/14/20 1839)    And  0.9 %  sodium chloride infusion ( Intravenous New Bag/Given 10/14/20 1741)  sodium chloride 0.9 % bolus 1,000 mL (1,000 mLs Intravenous New Bag/Given 10/14/20 1839)    ED Course  I have reviewed the triage vital signs and the nursing notes.  Pertinent labs & imaging results that were available during my care of the patient were reviewed by me and considered in my medical decision making (see chart for details).    MDM Rules/Calculators/A&P                         Kaiana Marion is a 55 y.o. female with pertinent past medical history of anemia, diabetes, hypertension, MDD, schizophrenia that presents to the emergency department today for altered mental status, [facility].  Blood pressure soft at 90 systolic, will give fluids at this time.  Patient is sleepy on my exam, however no  focal neuro deficits.  CT head without any acute intracranial abnormality.  Chest x-ray does show vascular congestion, no history of heart failure.  Echo done 1 year ago which does show 60-65 EF.  CMP does show concerns for AKI, BUN 45.  Could be contributing to her altered mental status, however awaiting other labs.  Pt care was handed off to M. Venter PA-C at 700.  Complete history and physical and current plan have been communicated.  Please refer to their note for the remainder of ED care and ultimate disposition.  Awaiting VBG, Covid, BNP, hydroxybutyric acid, CBC, urinalysis.  I discussed this case with my attending physician who cosigned this note including patient's presenting symptoms, physical exam, and planned diagnostics and interventions. Attending physician stated agreement with plan or made changes to plan which were implemented.   Attending physician assessed patient at bedside. Final Clinical Impression(s) / ED Diagnoses Final diagnoses:  Altered mental status, unspecified altered mental status type  AKI (acute kidney injury) [facility])    Rx / [place] Orders ED Discharge Orders    None       Alfredia Client, PA-C 10/14/20 1912    Lorelle Gibbs, DO 10/14/20 2347

## 2020-10-15 ENCOUNTER — Encounter (HOSPITAL_COMMUNITY): Payer: Self-pay | Admitting: Internal Medicine

## 2020-10-15 ENCOUNTER — Other Ambulatory Visit: Payer: Self-pay

## 2020-10-15 ENCOUNTER — Observation Stay (HOSPITAL_COMMUNITY): Payer: Medicaid Other

## 2020-10-15 DIAGNOSIS — F209 Schizophrenia, unspecified: Secondary | ICD-10-CM

## 2020-10-15 DIAGNOSIS — H548 Legal blindness, as defined in USA: Secondary | ICD-10-CM | POA: Diagnosis present

## 2020-10-15 DIAGNOSIS — Z20822 Contact with and (suspected) exposure to covid-19: Secondary | ICD-10-CM | POA: Diagnosis present

## 2020-10-15 DIAGNOSIS — D72829 Elevated white blood cell count, unspecified: Secondary | ICD-10-CM

## 2020-10-15 DIAGNOSIS — I10 Essential (primary) hypertension: Secondary | ICD-10-CM

## 2020-10-15 DIAGNOSIS — R400 Somnolence: Secondary | ICD-10-CM

## 2020-10-15 DIAGNOSIS — Z880 Allergy status to penicillin: Secondary | ICD-10-CM | POA: Diagnosis not present

## 2020-10-15 DIAGNOSIS — E1136 Type 2 diabetes mellitus with diabetic cataract: Secondary | ICD-10-CM | POA: Diagnosis present

## 2020-10-15 DIAGNOSIS — N179 Acute kidney failure, unspecified: Secondary | ICD-10-CM | POA: Diagnosis present

## 2020-10-15 DIAGNOSIS — G9341 Metabolic encephalopathy: Secondary | ICD-10-CM | POA: Diagnosis present

## 2020-10-15 DIAGNOSIS — Z794 Long term (current) use of insulin: Secondary | ICD-10-CM | POA: Diagnosis not present

## 2020-10-15 DIAGNOSIS — E785 Hyperlipidemia, unspecified: Secondary | ICD-10-CM

## 2020-10-15 DIAGNOSIS — E669 Obesity, unspecified: Secondary | ICD-10-CM | POA: Diagnosis present

## 2020-10-15 DIAGNOSIS — Z888 Allergy status to other drugs, medicaments and biological substances status: Secondary | ICD-10-CM | POA: Diagnosis not present

## 2020-10-15 DIAGNOSIS — E1165 Type 2 diabetes mellitus with hyperglycemia: Secondary | ICD-10-CM

## 2020-10-15 DIAGNOSIS — Z8249 Family history of ischemic heart disease and other diseases of the circulatory system: Secondary | ICD-10-CM | POA: Diagnosis not present

## 2020-10-15 DIAGNOSIS — R4182 Altered mental status, unspecified: Secondary | ICD-10-CM | POA: Diagnosis present

## 2020-10-15 DIAGNOSIS — N3000 Acute cystitis without hematuria: Secondary | ICD-10-CM

## 2020-10-15 DIAGNOSIS — E0842 Diabetes mellitus due to underlying condition with diabetic polyneuropathy: Secondary | ICD-10-CM | POA: Diagnosis not present

## 2020-10-15 DIAGNOSIS — F17210 Nicotine dependence, cigarettes, uncomplicated: Secondary | ICD-10-CM | POA: Diagnosis present

## 2020-10-15 DIAGNOSIS — G928 Other toxic encephalopathy: Secondary | ICD-10-CM | POA: Diagnosis present

## 2020-10-15 DIAGNOSIS — Z91040 Latex allergy status: Secondary | ICD-10-CM | POA: Diagnosis not present

## 2020-10-15 DIAGNOSIS — Z6830 Body mass index (BMI) 30.0-30.9, adult: Secondary | ICD-10-CM | POA: Diagnosis not present

## 2020-10-15 DIAGNOSIS — E1140 Type 2 diabetes mellitus with diabetic neuropathy, unspecified: Secondary | ICD-10-CM | POA: Diagnosis present

## 2020-10-15 DIAGNOSIS — N390 Urinary tract infection, site not specified: Secondary | ICD-10-CM | POA: Diagnosis not present

## 2020-10-15 DIAGNOSIS — Z86718 Personal history of other venous thrombosis and embolism: Secondary | ICD-10-CM | POA: Diagnosis not present

## 2020-10-15 DIAGNOSIS — K5900 Constipation, unspecified: Secondary | ICD-10-CM | POA: Diagnosis present

## 2020-10-15 DIAGNOSIS — H409 Unspecified glaucoma: Secondary | ICD-10-CM | POA: Diagnosis present

## 2020-10-15 DIAGNOSIS — E782 Mixed hyperlipidemia: Secondary | ICD-10-CM

## 2020-10-15 DIAGNOSIS — B961 Klebsiella pneumoniae [K. pneumoniae] as the cause of diseases classified elsewhere: Secondary | ICD-10-CM | POA: Diagnosis present

## 2020-10-15 LAB — CBC
HCT: 38.9 % (ref 36.0–46.0)
HCT: 40.2 % (ref 36.0–46.0)
Hemoglobin: 12.5 g/dL (ref 12.0–15.0)
Hemoglobin: 13.1 g/dL (ref 12.0–15.0)
MCH: 30.4 pg (ref 26.0–34.0)
MCH: 30.4 pg (ref 26.0–34.0)
MCHC: 32.1 g/dL (ref 30.0–36.0)
MCHC: 32.6 g/dL (ref 30.0–36.0)
MCV: 94.6 fL (ref 80.0–100.0)
MCV: 93.3 fL (ref 80.0–100.0)
Platelets: 261 10*3/uL (ref 150–400)
Platelets: 283 10*3/uL (ref 150–400)
RBC: 4.11 MIL/uL (ref 3.87–5.11)
RBC: 4.31 MIL/uL (ref 3.87–5.11)
RDW: 12.8 % (ref 11.5–15.5)
RDW: 12.7 % (ref 11.5–15.5)
WBC: 5.6 10*3/uL (ref 4.0–10.5)
WBC: 6.4 10*3/uL (ref 4.0–10.5)
nRBC: 0 % (ref 0.0–0.2)

## 2020-10-15 LAB — COMPREHENSIVE METABOLIC PANEL
ALT: 27 U/L (ref 0–44)
AST: 29 U/L (ref 15–41)
Albumin: 4 g/dL (ref 3.5–5.0)
Alkaline Phosphatase: 85 U/L (ref 38–126)
Anion gap: 11 (ref 5–15)
BUN: 28 mg/dL — ABNORMAL HIGH (ref 6–20)
CO2: 21 mmol/L — ABNORMAL LOW (ref 22–32)
Calcium: 9.5 mg/dL (ref 8.9–10.3)
Chloride: 103 mmol/L (ref 98–111)
Creatinine, Ser: 0.74 mg/dL (ref 0.44–1.00)
GFR, Estimated: >60 mL/min (ref >60)
Glucose, Bld: 190 mg/dL — ABNORMAL HIGH (ref 70–99)
Potassium: 3.7 mmol/L (ref 3.5–5.1)
Sodium: 135 mmol/L (ref 135–145)
Total Bilirubin: 0.5 mg/dL (ref 0.3–1.2)
Total Protein: 8.2 g/dL — ABNORMAL HIGH (ref 6.5–8.1)

## 2020-10-15 LAB — HEMOGLOBIN A1C
Hgb A1c MFr Bld: 8.2 % — ABNORMAL HIGH (ref 4.8–5.6)
Mean Plasma Glucose: 188.64 mg/dL

## 2020-10-15 LAB — BLOOD GAS, VENOUS
Acid-base deficit: 3.6 mmol/L — ABNORMAL HIGH (ref 0.0–2.0)
Bicarbonate: 20.9 mmol/L (ref 20.0–28.0)
Drawn by: 5863
FIO2: 99
O2 Saturation: 77.9 %
Patient temperature: 36.4
pCO2, Ven: 40.2 mmHg — ABNORMAL LOW (ref 44.0–60.0)
pH, Ven: 7.341 (ref 7.250–7.430)
pO2, Ven: 49.3 mmHg — ABNORMAL HIGH (ref 32.0–45.0)

## 2020-10-15 LAB — TSH: TSH: 0.756 u[IU]/mL (ref 0.350–4.500)

## 2020-10-15 LAB — GLUCOSE, CAPILLARY
Glucose-Capillary: 169 mg/dL — ABNORMAL HIGH (ref 70–99)
Glucose-Capillary: 209 mg/dL — ABNORMAL HIGH (ref 70–99)
Glucose-Capillary: 236 mg/dL — ABNORMAL HIGH (ref 70–99)

## 2020-10-15 LAB — MAGNESIUM: Magnesium: 1.8 mg/dL (ref 1.7–2.4)

## 2020-10-15 LAB — SARS CORONAVIRUS 2 (TAT 6-24 HRS): SARS Coronavirus 2: NEGATIVE

## 2020-10-15 LAB — CREATININE, SERUM
Creatinine, Ser: 0.75 mg/dL (ref 0.44–1.00)
GFR, Estimated: >60 mL/min (ref >60)

## 2020-10-15 LAB — MRSA PCR SCREENING: MRSA by PCR: NEGATIVE

## 2020-10-15 LAB — HIV ANTIBODY (ROUTINE TESTING W REFLEX): HIV Screen 4th Generation wRfx: NONREACTIVE

## 2020-10-15 LAB — CBG MONITORING, ED: Glucose-Capillary: 233 mg/dL — ABNORMAL HIGH (ref 70–99)

## 2020-10-15 LAB — PHOSPHORUS: Phosphorus: 3.5 mg/dL (ref 2.5–4.6)

## 2020-10-15 LAB — VALPROIC ACID LEVEL: Valproic Acid Lvl: 18 ug/mL — ABNORMAL LOW (ref 50.0–100.0)

## 2020-10-15 MED ORDER — INSULIN ASPART 100 UNIT/ML ~~LOC~~ SOLN
0.0000 [IU] | Freq: Every day | SUBCUTANEOUS | Status: DC
  Administered 2020-10-15: 2 [IU] via SUBCUTANEOUS
  Administered 2020-10-16: 4 [IU] via SUBCUTANEOUS

## 2020-10-15 MED ORDER — BENZTROPINE MESYLATE 1 MG PO TABS
0.5000 mg | ORAL_TABLET | Freq: Two times a day (BID) | ORAL | Status: DC
  Administered 2020-10-15 – 2020-10-17 (×5): 0.5 mg via ORAL
  Filled 2020-10-15 (×5): qty 1

## 2020-10-15 MED ORDER — TIMOLOL MALEATE 0.5 % OP SOLN
1.0000 [drp] | Freq: Two times a day (BID) | OPHTHALMIC | Status: DC
  Administered 2020-10-15 – 2020-10-17 (×5): 1 [drp] via OPHTHALMIC
  Filled 2020-10-15: qty 5

## 2020-10-15 MED ORDER — SODIUM CHLORIDE 0.9 % IV SOLN
1.0000 g | INTRAVENOUS | Status: AC
  Administered 2020-10-15 – 2020-10-16 (×2): 1 g via INTRAVENOUS
  Filled 2020-10-15 (×2): qty 10

## 2020-10-15 MED ORDER — QUETIAPINE FUMARATE ER 50 MG PO TB24
200.0000 mg | ORAL_TABLET | Freq: Every evening | ORAL | Status: DC
  Administered 2020-10-15 – 2020-10-16 (×2): 200 mg via ORAL
  Filled 2020-10-15: qty 1
  Filled 2020-10-15 (×2): qty 4

## 2020-10-15 MED ORDER — ACETAMINOPHEN 325 MG PO TABS: 650.0000 mg | ORAL_TABLET | ORAL | Status: DC | PRN

## 2020-10-15 MED ORDER — SENNA 8.6 MG PO TABS
2.0000 | ORAL_TABLET | Freq: Two times a day (BID) | ORAL | Status: DC
  Administered 2020-10-15 – 2020-10-16 (×2): 17.2 mg via ORAL
  Filled 2020-10-15 (×2): qty 2

## 2020-10-15 MED ORDER — LACTULOSE 10 GM/15ML PO SOLN
20.0000 g | Freq: Once | ORAL | Status: AC
  Administered 2020-10-15: 20 g via ORAL
  Filled 2020-10-15: qty 30

## 2020-10-15 MED ORDER — ATORVASTATIN CALCIUM 10 MG PO TABS
10.0000 mg | ORAL_TABLET | Freq: Every evening | ORAL | Status: DC
  Administered 2020-10-15 – 2020-10-16 (×2): 10 mg via ORAL
  Filled 2020-10-15 (×2): qty 1

## 2020-10-15 MED ORDER — TIOTROPIUM BROMIDE MONOHYDRATE 18 MCG IN CAPS: 18.0000 ug | ORAL_CAPSULE | Freq: Every day | RESPIRATORY_TRACT | Status: DC

## 2020-10-15 MED ORDER — BRIMONIDINE TARTRATE 0.2 % OP SOLN
1.0000 [drp] | Freq: Three times a day (TID) | OPHTHALMIC | Status: DC
  Administered 2020-10-15 – 2020-10-17 (×6): 1 [drp] via OPHTHALMIC
  Filled 2020-10-15: qty 5

## 2020-10-15 MED ORDER — PREDNISOLONE ACETATE 1 % OP SUSP
1.0000 [drp] | Freq: Four times a day (QID) | OPHTHALMIC | Status: DC
  Administered 2020-10-15 – 2020-10-17 (×7): 1 [drp] via OPHTHALMIC
  Filled 2020-10-15: qty 1

## 2020-10-15 MED ORDER — UMECLIDINIUM BROMIDE 62.5 MCG/INH IN AEPB
1.0000 | INHALATION_SPRAY | Freq: Every day | RESPIRATORY_TRACT | Status: DC
  Administered 2020-10-16 – 2020-10-17 (×2): 1 via RESPIRATORY_TRACT
  Filled 2020-10-15: qty 7

## 2020-10-15 MED ORDER — INSULIN GLARGINE 100 UNIT/ML ~~LOC~~ SOLN
10.0000 [IU] | Freq: Two times a day (BID) | SUBCUTANEOUS | Status: DC
  Administered 2020-10-15 – 2020-10-16 (×2): 10 [IU] via SUBCUTANEOUS
  Filled 2020-10-15 (×9): qty 0.1

## 2020-10-15 MED ORDER — SENNOSIDES-DOCUSATE SODIUM 8.6-50 MG PO TABS
1.0000 | ORAL_TABLET | Freq: Every day | ORAL | Status: DC
  Administered 2020-10-15: 1 via ORAL
  Filled 2020-10-15: qty 1

## 2020-10-15 MED ORDER — DIVALPROEX SODIUM 125 MG PO CSDR
125.0000 mg | DELAYED_RELEASE_CAPSULE | Freq: Four times a day (QID) | ORAL | Status: DC
  Administered 2020-10-15: 125 mg via ORAL
  Filled 2020-10-15: qty 1

## 2020-10-15 MED ORDER — POLYETHYLENE GLYCOL 3350 17 G PO PACK
17.0000 g | PACK | Freq: Every day | ORAL | Status: DC
  Administered 2020-10-15 – 2020-10-17 (×3): 17 g via ORAL
  Filled 2020-10-15 (×3): qty 1

## 2020-10-15 MED ORDER — ACETAZOLAMIDE ER 500 MG PO CP12: 500.0000 mg | ORAL_CAPSULE | Freq: Two times a day (BID) | ORAL | Status: DC

## 2020-10-15 MED ORDER — COLCHICINE 0.6 MG PO TABS
0.6000 mg | ORAL_TABLET | Freq: Every day | ORAL | Status: DC
Start: 2020-10-15 — End: 2020-10-17
  Administered 2020-10-15 – 2020-10-17 (×3): 0.6 mg via ORAL
  Filled 2020-10-15 (×3): qty 1

## 2020-10-15 MED ORDER — DORZOLAMIDE HCL 2 % OP SOLN
1.0000 [drp] | Freq: Two times a day (BID) | OPHTHALMIC | Status: DC
  Administered 2020-10-15 – 2020-10-17 (×5): 1 [drp] via OPHTHALMIC
  Filled 2020-10-15: qty 10

## 2020-10-15 MED ORDER — GABAPENTIN 400 MG PO CAPS
400.0000 mg | ORAL_CAPSULE | Freq: Four times a day (QID) | ORAL | Status: DC
  Administered 2020-10-15 – 2020-10-16 (×5): 400 mg via ORAL
  Filled 2020-10-15 (×5): qty 1

## 2020-10-15 MED ORDER — HEPARIN SODIUM (PORCINE) 5000 UNIT/ML IJ SOLN
5000.0000 [IU] | Freq: Three times a day (TID) | INTRAMUSCULAR | Status: DC
  Administered 2020-10-15 – 2020-10-17 (×7): 5000 [IU] via SUBCUTANEOUS
  Filled 2020-10-15 (×6): qty 1

## 2020-10-15 MED ORDER — ASPIRIN EC 81 MG PO TBEC
81.0000 mg | DELAYED_RELEASE_TABLET | Freq: Every morning | ORAL | Status: DC
  Administered 2020-10-15 – 2020-10-17 (×3): 81 mg via ORAL
  Filled 2020-10-15 (×3): qty 1

## 2020-10-15 MED ORDER — INSULIN ASPART 100 UNIT/ML ~~LOC~~ SOLN
0.0000 [IU] | Freq: Three times a day (TID) | SUBCUTANEOUS | Status: DC
  Administered 2020-10-15 (×2): 5 [IU] via SUBCUTANEOUS
  Administered 2020-10-15: 3 [IU] via SUBCUTANEOUS
  Administered 2020-10-16: 8 [IU] via SUBCUTANEOUS
  Administered 2020-10-16: 3 [IU] via SUBCUTANEOUS
  Administered 2020-10-16 – 2020-10-17 (×2): 8 [IU] via SUBCUTANEOUS
  Administered 2020-10-17: 11 [IU] via SUBCUTANEOUS
  Filled 2020-10-15: qty 1

## 2020-10-15 MED ORDER — LISINOPRIL 10 MG PO TABS
20.0000 mg | ORAL_TABLET | Freq: Every day | ORAL | Status: DC
  Administered 2020-10-15: 20 mg via ORAL
  Filled 2020-10-15: qty 2

## 2020-10-15 MED ORDER — ACETAMINOPHEN 325 MG PO TABS
650.0000 mg | ORAL_TABLET | ORAL | Status: DC | PRN
  Administered 2020-10-15: 650 mg via ORAL
  Filled 2020-10-15: qty 2

## 2020-10-15 MED ORDER — RISPERIDONE 1 MG PO TABS
1.0000 mg | ORAL_TABLET | Freq: Two times a day (BID) | ORAL | Status: DC
  Administered 2020-10-15 – 2020-10-16 (×3): 1 mg via ORAL
  Filled 2020-10-15 (×3): qty 1

## 2020-10-15 MED ORDER — TRAMADOL HCL 50 MG PO TABS
50.0000 mg | ORAL_TABLET | Freq: Four times a day (QID) | ORAL | Status: DC | PRN
  Administered 2020-10-16: 50 mg via ORAL
  Filled 2020-10-15: qty 1

## 2020-10-15 MED ORDER — DIVALPROEX SODIUM ER 500 MG PO TB24
500.0000 mg | ORAL_TABLET | Freq: Every day | ORAL | Status: DC
Start: 2020-10-15 — End: 2020-10-15
  Filled 2020-10-15: qty 1

## 2020-10-15 NOTE — Progress Notes (Signed)
[facility] PLAN OF CARE NOTE Patient: Regina Watson ECX:[id number]   PCP: Megan Mans, NP DOB: [id number]   DOA: 10/14/2020   DOS: 10/15/2020    Patient was admitted by my colleague earlier on 10/15/2020. I have reviewed the H&P as well as assessment and plan and agree with the same. Important changes in the plan are listed below.  Plan of care: Principal Problem:   Altered mental status Active Problems:   Essential hypertension   Schizophrenia ([place])   UTI (urinary tract infection)   Leukocytosis   AKI (acute kidney injury) ([place])   Hyperglycemia due to diabetes mellitus ([place])   Hyperlipidemia   Diabetic neuropathy ([place])  Pt appears to be improving mentally. Will advance diet.urine culture so far pending. Check depakote level.   Author: Berle Mull, MD [facility] 10/15/2020 12:54 PM   If 7PM-7AM, please contact night-coverage at [email or web address]

## 2020-10-15 NOTE — H&P (Signed)
History and Physical  Regina Watson ATF:[id number] DOB: 1965/10/14 DOA: 10/14/2020  Referring physician: Eustaquio Maize, PA-C PCP: Megan Mans, NP  Patient coming from: [place]  Chief Complaint: Altered mental status  HPI: Regina Watson is a 55 y.o. 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  Patient states that she felt very sleepy and tired at the nursing facility, and the nursing staff felt that she had altered mental status and she was sent to the ED for further evaluation and management.  Patient endorsed burning sensation on urination that has been going on for several days, but she denies fever, chills, chest pain, shortness of breath, nausea, vomiting or abdominal pain.  ED Course:  In the emergency department, she was tachycardic, but other vital signs are within normal range.  Work-up in the ED showed leukocytosis, BUN/creatinine 45/1.53 (baseline creatinine at 0.5-0.7), sodium 135, potassium 4.5, chloride 104, bicarb 17, glucose 231.  Urinalysis was unimpressive for UTI, urine drug screen was negative, BNP 21. Chest x-ray showed pulmonary vascular congestion.  CT of head without contrast showed no acute intracranial findings.  Patient was started on IV ceftriaxone due to UTI, IV hydration was provided.  Hospitalist was asked to admit patient for further evaluation and management.  Review of Systems: Constitutional: Negative for chills and fever.  HENT: Negative for ear pain and sore throat.   Eyes: Negative for pain and visual disturbance.  Respiratory: Negative for cough, chest tightness and shortness of breath.   Cardiovascular: Negative for chest pain and palpitations.  Gastrointestinal: Negative for abdominal pain and vomiting.  Endocrine: Negative for polyphagia and polyuria.  Genitourinary:  Positive for burning sensation on urination.  Negative for decreased urine volume, enuresis, hematuria Musculoskeletal: Negative for arthralgias and back pain.  Skin: Negative for color change and rash.  Allergic/Immunologic: Negative for immunocompromised state.  Neurological: Negative for tremors, syncope, speech difficulty, weakness, light-headedness and headaches.  Hematological: Does not bruise/bleed easily.  All other systems reviewed and are negative    Past Medical History:  Diagnosis Date  . Anemia   . Diabetes mellitus without complication ([place])   . Hypertension   . MDD (major depressive disorder)   . Neuropathy 2010  . Schizophrenia [facility])    Past Surgical History:  Procedure Laterality Date  . TOE AMPUTATION      Social History:  reports that she has been smoking cigarettes. She has a 20.00 pack-year smoking history. She has never used smokeless tobacco. She reports that she does not drink alcohol and does not use drugs.   Allergies  Allergen Reactions  . Bactrim [Sulfamethoxazole-Trimethoprim] Hives  . Penicillins     Has patient had a PCN reaction causing immediate rash, facial/tongue/throat swelling, SOB or lightheadedness with hypotension: UNKNOWN Has patient had a PCN reaction causing severe rash involving mucus membranes or skin necrosis: UNKNOWN Has patient had a PCN reaction that required hospitalization: UNKNOWN Has patient had a PCN reaction occurring within the last 10 years: UNKNOWN If all of the above answers are "NO", then may proceed with Cephalosporin use.   . Latex Itching    Family History  Problem Relation Age of Onset  . Hypertension Mother   . Cancer Mother   . Diabetes Father      Prior to Admission medications   Medication Sig Start Date End Date Taking? Authorizing Provider  ACETAZOLAMIDE ER  PO Take 1 tablet by mouth in the morning and at bedtime.   Yes [provider]  aspirin EC 81 MG tablet Take 81 mg by mouth every  morning.   Yes [provider]  atorvastatin (LIPITOR) 10 MG tablet Take 10 mg by mouth every evening.    Yes [provider]  benztropine (COGENTIN) 0.5 MG tablet Take 0.5 mg by mouth 2 (two) times daily.   Yes [provider]  brimonidine (ALPHAGAN) 0.2 % ophthalmic solution Place 1 drop into the left eye 3 (three) times daily.   Yes [provider]  divalproex (DEPAKOTE ER) 500 MG 24 hr tablet Take 1 tablet (500 mg total) by mouth at bedtime. 10/17/19 11/16/19 Yes Shah, Pratik D, DO  dorzolamide (TRUSOPT) 2 % ophthalmic solution Place 1 drop into the left eye 2 (two) times daily. 09/10/20  Yes [provider]  furosemide (LASIX) 40 MG tablet Take 40 mg by mouth.   Yes [provider]  gabapentin (NEURONTIN) 400 MG capsule Take 400 mg by mouth 4 (four) times daily.   Yes [provider]  hydrOXYzine (ATARAX/VISTARIL) 25 MG tablet Take 25 mg by mouth 3 (three) times daily.   Yes [provider]  insulin aspart (NOVOLOG) 100 UNIT/ML FlexPen Inject 11-16 Units into the skin 3 (three) times daily with meals. SLIDING SCALE: 150-200= 11 units 201-250= 12 units 251-300= 13 units 301-350= 14 units 351-400= 15 units >400=      16 units Call if less than 70 or greater than 300 for 3 tests  In a row   Yes [provider]  insulin glargine (LANTUS) 100 UNIT/ML injection Inject 0.14 mLs (14 Units total) into the skin at bedtime. Patient taking differently: Inject 20 Units into the skin 2 (two) times daily. 10/17/19  Yes Shah, Pratik D, DO  lactulose, encephalopathy, (CHRONULAC) 10 GM/15ML SOLN Take 30 g by mouth 2 (two) times daily.   Yes [provider]  levocetirizine (XYZAL) 5 MG tablet Take 5 mg by mouth every evening.   Yes [provider]  lisinopril (ZESTRIL) 20 MG tablet Take 20 mg by mouth daily.    Yes [provider]  metFORMIN (GLUCOPHAGE) 1000 MG tablet Take 1,000 mg by mouth 2 (two) times  daily with a meal.   Yes [provider]  MITIGARE 0.6 MG CAPS Take 1 capsule by mouth daily. 09/29/20  Yes [provider]  polyethylene glycol (MIRALAX / GLYCOLAX) 17 g packet Take 17 g by mouth daily.   Yes [provider]  prednisoLONE acetate (PRED FORTE) 1 % ophthalmic suspension Place 1 drop into both eyes 4 (four) times daily.   Yes [provider]  QUEtiapine (SEROQUEL XR) 200 MG 24 hr tablet Take 1 tablet (200 mg total) by mouth every evening. Patient taking differently: Take 400 mg by mouth every evening. 10/17/19 11/16/19 Yes Shah, Pratik D, DO  risperiDONE (RISPERDAL) 1 MG tablet Take 1 tablet (1 mg total) by mouth 2 (two) times daily. Patient taking differently: Take 3 mg by mouth 2 (two) times daily. 10/17/19 11/16/19 Yes Shah, Pratik D, DO  sennosides-docusate sodium (SENOKOT-S) 8.6-50 MG tablet Take 1 tablet by mouth in the morning and at bedtime.   Yes [provider]  timolol (TIMOPTIC) 0.5 % ophthalmic solution Place 1 drop into the left eye 2 (two) times daily. 09/09/20  Yes [provider]  tiotropium (SPIRIVA) 18 MCG inhalation capsule Place 18 mcg into inhaler and inhale daily.  Yes [provider]  Vitamin D, Ergocalciferol, (DRISDOL) 1.25 MG (50000 UNIT) CAPS capsule Take 50,000 Units by mouth every 7 (seven) days.   Yes [provider]  acetaminophen (TYLENOL) 325 MG tablet Take 650 mg by mouth every 4 (four) hours as needed for mild pain or moderate pain. Patient not taking: Reported on 10/14/2020    [provider]  clobetasol (TEMOVATE) 0.05 % GEL Apply 1 application topically 2 (two) times daily. Patient not taking: Reported on 10/14/2020    [provider]  colchicine 0.6 MG tablet Take 0.6 mg by mouth daily. Patient not taking: Reported on 10/14/2020    [provider]  lactulose (CHRONULAC) 10 GM/15ML solution Take 20 g by mouth 2 (two) times daily. Patient not taking:  Reported on 10/14/2020    [provider]  Minoxidil 5 % FOAM Apply 1 application topically in the morning and at bedtime. Patient not taking: Reported on 10/14/2020    [provider]  nicotine polacrilex (NICORETTE) 4 MG gum Take 4 mg by mouth See admin instructions. Chew one piece 24 times per day for 30 minutes as needed    [provider]  triamcinolone ointment (KENALOG) 0.1 % Apply 1 application topically 2 (two) times daily. Patient not taking: Reported on 10/14/2020    [provider]    Physical Exam: BP 124/85   Pulse (!) 108   Temp 98.1 F (36.7 C) (Axillary)   Resp 14   SpO2 100%   . General: 55 y.o. year-old female well developed well nourished in no acute distress.  Alert and oriented x3. Marland Kitchen HEENT: NCAT, EOMI . Neck: Supple, trachea medial . Cardiovascular: Tachycardia.  Regular rate and rhythm with no rubs or gallops.  No thyromegaly or JVD noted.  No lower extremity edema. 2/4 pulses in all 4 extremities. Marland Kitchen Respiratory: Clear to auscultation with no wheezes or rales. Good inspiratory effort. . Abdomen: Soft nontender nondistended with normal bowel sounds x4 quadrants. . Muskuloskeletal: LLE is more swollen than RLE (chronic per patient).  No cyanosis or clubbing  noted bilaterally . Neuro: No focal deficits.  Sensation, reflexes intact . Skin: No ulcerative lesions noted or rashes . Psychiatry: Mood is appropriate for condition and setting          Labs on Admission:  Basic Metabolic Panel: Recent Labs  Lab 10/14/20 1753 10/15/20 0100  NA 135  --   K 4.5  --   CL 104  --   CO2 17*  --   GLUCOSE 231*  --   BUN 45*  --   CREATININE 1.53* 0.75  CALCIUM 10.2  --    Liver Function Tests: Recent Labs  Lab 10/14/20 1753  AST 28  ALT 27  ALKPHOS 91  BILITOT 0.5  PROT 8.7*  ALBUMIN 4.3   No results for input(s): LIPASE, AMYLASE in the last 168 hours. Recent Labs  Lab 10/14/20 1626  AMMONIA 25   CBC: Recent Labs   Lab 10/14/20 2158 10/15/20 0100  WBC 5.2 5.6  NEUTROABS 2.7  --   HGB 15.4* 12.5  HCT 48.8* 38.9  MCV 96.8 94.6  PLT 238 261   Cardiac Enzymes: No results for input(s): CKTOTAL, CKMB, CKMBINDEX, TROPONINI in the last 168 hours.  BNP (last 3 results) Recent Labs    10/14/20 2158  BNP 21.0    ProBNP (last 3 results) No results for input(s): PROBNP in the last 8760 hours.  CBG: Recent Labs  Lab 10/14/20  1522 10/14/20 1658  GLUCAP 271* 200*    Radiological Exams on Admission: DG Chest 1 View  Result Date: 10/14/2020 CLINICAL DATA:  Altered mental status. Patient unable to provide history. Weakness. EXAM: CHEST  1 VIEW COMPARISON:  09/27/2019 FINDINGS: Heart size and mediastinal contours appear normal. Pulmonary vascular congestion noted. No pleural effusion or edema. No airspace densities. Visualized osseous structures are unremarkable. IMPRESSION: Pulmonary vascular congestion. Electronically Signed   By: Kerby Moors M.D.   On: 10/14/2020 17:08   CT HEAD WO CONTRAST  Result Date: 10/14/2020 CLINICAL DATA:  Altered mental status and lethargic EXAM: CT HEAD WITHOUT CONTRAST TECHNIQUE: Contiguous axial images were obtained from the base of the skull through the vertex without intravenous contrast. COMPARISON:  Head CT October 14, 2019 FINDINGS: Brain: No evidence of acute infarction, hemorrhage, hydrocephalus, extra-axial collection or mass lesion/mass effect. Similar moderate global parenchymal atrophy advanced for age. Mild burden of chronic small vessel ischemic white matter disease, similar prior. 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 Vascular: No hyperdense vessel or unexpected calcification. Skull: Normal. Negative for fracture or focal lesion. Sinuses/Orbits: No acute finding. Other: None. IMPRESSION: 1. No acute intracranial findings. 2. Similar moderate global parenchymal atrophy advanced for age. 3. Mild  chronic small vessel ischemic white matter disease, not significantly changed from prior. Electronically Signed   By: Dahlia Bailiff MD   On: 10/14/2020 16:52    EKG: I independently viewed the EKG done and my findings are as followed: EKG was not done in the ED  Assessment/Plan Present on Admission: . Altered mental status . UTI (urinary tract infection) . Essential hypertension . Schizophrenia ([place])  Principal Problem:   Altered mental status Active Problems:   Essential hypertension   Schizophrenia ([place])   UTI (urinary tract infection)   Leukocytosis   AKI (acute kidney injury) ([place])   Hyperglycemia due to diabetes mellitus ([facility])   Hyperlipidemia   Diabetic neuropathy ([facility])  Altered mental status possibly secondary to UTI POA Altered mental status has improved since arrival to the ED Patient endorsed several days of onset of dysuria She was started on IV ceftriaxone, we shall continue with same at this time Urine culture pending  Leukocytosis in the setting of above Continue IV ceftriaxone Continue to monitor WBC with morning labs  Acute kidney injury BUN/creatinine 45/1.53 (baseline creatinine at 0.5-0.7) IV hydration was provided Renally adjust medications, avoid nephrotoxic agents/dehydration/hypotension  Hyperglycemia secondary to T2DM Continue ISS and hypoglycemic protocol Metformin will be held at this time  LLE swelling (chronic) LLE was noted to be more swollen than RLE, patient states that it was chronic Left lower extremity DVT done on 10/14/2019 showed no evidence of DVT in the left lower extremity  Hyperlipidemia Continue Lipitor  Essential hypertension Continue lisinopril Lasix will be held at this time due to acute kidney injury  Schizophrenia Continue Depakote, Seroquel and Risperdal  Diabetic neuropathy Continue Neurontin  DVT prophylaxis: Heparin subcu  Code Status: Full code  Family Communication: None at bedside  Disposition Plan:   Patient is from:                        home Anticipated DC to:                   SNF or family members home Anticipated DC date:  1 day Anticipated DC barriers:           Inpatient was required due to patient's altered mental status requiring IV antibiotics for UTI   Consults called: None  Admission status: Observation    Bernadette Hoit MD [facility]  10/15/2020, 3:45 AM

## 2020-10-16 ENCOUNTER — Inpatient Hospital Stay (HOSPITAL_COMMUNITY): Payer: Medicaid Other

## 2020-10-16 DIAGNOSIS — R400 Somnolence: Secondary | ICD-10-CM | POA: Diagnosis not present

## 2020-10-16 LAB — CBC WITH DIFFERENTIAL/PLATELET
Abs Immature Granulocytes: 0.06 10*3/uL (ref 0.00–0.07)
Basophils Absolute: 0 10*3/uL (ref 0.0–0.1)
Basophils Relative: 0 %
Eosinophils Absolute: 0.2 10*3/uL (ref 0.0–0.5)
Eosinophils Relative: 3 %
HCT: 44.8 % (ref 36.0–46.0)
Hemoglobin: 14.2 g/dL (ref 12.0–15.0)
Immature Granulocytes: 1 %
Lymphocytes Relative: 39 %
Lymphs Abs: 2.3 10*3/uL (ref 0.7–4.0)
MCH: 30.3 pg (ref 26.0–34.0)
MCHC: 31.7 g/dL (ref 30.0–36.0)
MCV: 95.5 fL (ref 80.0–100.0)
Monocytes Absolute: 0.8 10*3/uL (ref 0.1–1.0)
Monocytes Relative: 14 %
Neutro Abs: 2.6 10*3/uL (ref 1.7–7.7)
Neutrophils Relative %: 43 %
Platelets: 270 10*3/uL (ref 150–400)
RBC: 4.69 MIL/uL (ref 3.87–5.11)
RDW: 12.6 % (ref 11.5–15.5)
WBC: 5.9 10*3/uL (ref 4.0–10.5)
nRBC: 0 % (ref 0.0–0.2)

## 2020-10-16 LAB — COMPREHENSIVE METABOLIC PANEL
ALT: 35 U/L (ref 0–44)
AST: 35 U/L (ref 15–41)
Albumin: 3.7 g/dL (ref 3.5–5.0)
Alkaline Phosphatase: 86 U/L (ref 38–126)
Anion gap: 9 (ref 5–15)
BUN: 31 mg/dL — ABNORMAL HIGH (ref 6–20)
CO2: 24 mmol/L (ref 22–32)
Calcium: 9.8 mg/dL (ref 8.9–10.3)
Chloride: 102 mmol/L (ref 98–111)
Creatinine, Ser: 1.02 mg/dL — ABNORMAL HIGH (ref 0.44–1.00)
GFR, Estimated: >60 mL/min (ref >60)
Glucose, Bld: 266 mg/dL — ABNORMAL HIGH (ref 70–99)
Potassium: 3.4 mmol/L — ABNORMAL LOW (ref 3.5–5.1)
Sodium: 135 mmol/L (ref 135–145)
Total Bilirubin: 0.5 mg/dL (ref 0.3–1.2)
Total Protein: 7.8 g/dL (ref 6.5–8.1)

## 2020-10-16 LAB — GLUCOSE, CAPILLARY
Glucose-Capillary: 176 mg/dL — ABNORMAL HIGH (ref 70–99)
Glucose-Capillary: 275 mg/dL — ABNORMAL HIGH (ref 70–99)
Glucose-Capillary: 276 mg/dL — ABNORMAL HIGH (ref 70–99)
Glucose-Capillary: 306 mg/dL — ABNORMAL HIGH (ref 70–99)
Glucose-Capillary: 347 mg/dL — ABNORMAL HIGH (ref 70–99)

## 2020-10-16 LAB — BLOOD GAS, ARTERIAL
Acid-base deficit: 1.3 mmol/L (ref 0.0–2.0)
Bicarbonate: 23.2 mmol/L (ref 20.0–28.0)
FIO2: 21
O2 Saturation: 93.9 %
Patient temperature: 36.8
pCO2 arterial: 40.5 mmHg (ref 32.0–48.0)
pH, Arterial: 7.376 (ref 7.350–7.450)
pO2, Arterial: 73.6 mmHg — ABNORMAL LOW (ref 83.0–108.0)

## 2020-10-16 LAB — T4, FREE: Free T4: 0.89 ng/dL (ref 0.61–1.12)

## 2020-10-16 LAB — MAGNESIUM: Magnesium: 2.1 mg/dL (ref 1.7–2.4)

## 2020-10-16 LAB — PROTIME-INR
INR: 1 (ref 0.8–1.2)
Prothrombin Time: 12.7 seconds (ref 11.4–15.2)

## 2020-10-16 MED ORDER — DIVALPROEX SODIUM ER 500 MG PO TB24
500.0000 mg | ORAL_TABLET | Freq: Every day | ORAL | Status: DC
  Administered 2020-10-16: 500 mg via ORAL
  Filled 2020-10-16: qty 1

## 2020-10-16 MED ORDER — INSULIN GLARGINE 100 UNIT/ML ~~LOC~~ SOLN
20.0000 [IU] | Freq: Every day | SUBCUTANEOUS | Status: DC
  Administered 2020-10-16: 20 [IU] via SUBCUTANEOUS
  Filled 2020-10-16 (×3): qty 0.2

## 2020-10-16 MED ORDER — SENNOSIDES-DOCUSATE SODIUM 8.6-50 MG PO TABS
1.0000 | ORAL_TABLET | Freq: Two times a day (BID) | ORAL | Status: DC
  Administered 2020-10-16 – 2020-10-17 (×2): 1 via ORAL
  Filled 2020-10-16 (×2): qty 1

## 2020-10-16 MED ORDER — LACTULOSE 10 GM/15ML PO SOLN
20.0000 g | Freq: Two times a day (BID) | ORAL | Status: DC
  Administered 2020-10-16 – 2020-10-17 (×3): 20 g via ORAL
  Filled 2020-10-16 (×3): qty 30

## 2020-10-16 MED ORDER — GABAPENTIN 400 MG PO CAPS
400.0000 mg | ORAL_CAPSULE | Freq: Three times a day (TID) | ORAL | Status: DC
  Administered 2020-10-16 – 2020-10-17 (×3): 400 mg via ORAL
  Filled 2020-10-16 (×3): qty 1

## 2020-10-16 NOTE — Progress Notes (Addendum)
Inpatient Diabetes Program Recommendations  AACE/ADA: New Consensus Statement on Inpatient Glycemic Control   Target Ranges:  Prepandial:   less than 140 mg/dL      Peak postprandial:   less than 180 mg/dL (1-2 hours)      Critically ill patients:  140 - 180 mg/dL   Results for DAMA, HEDGEPETH (MRN [id number]) as of 10/16/2020 09:40  Ref. Range 10/15/2020 08:43 10/15/2020 11:52 10/15/2020 17:12 10/15/2020 22:24 10/16/2020 00:13 10/16/2020 07:40  Glucose-Capillary Latest Ref Range: 70 - 99 mg/dL 233 (H)  Novolog 5 units 169 (H)  Novolog 3 units  Lantus 10 units 209 (H)  Novolog 5 units 236 (H)  Novolog 2 units 306 (H)     Lantus 10 units 275 (H)  Novolog 8 units   Review of Glycemic Control  Diabetes history: DM2 Outpatient Diabetes medications: Lantus 20 units BID, Novolog 11-16 units TID with meals, Metformin 1000 mg BID Current orders for Inpatient glycemic control: Novolog 0-15 units TID with meals, Novolog 0-5 units QHS  Inpatient Diabetes Program Recommendations:    Insulin: Patient was ordered Lantus 10 units BID (received Lantus 10 units BID on 10/15/20) but it was discontinued. Fasting glucose 275 mg/dl today. Please consider ordering Lantus 15 units BID and once diet resumed Novolog 3 units TID with meals for meal coverage if patient eats at least 50% of meals.  Thanks, Barnie Alderman, RN, MSN, CDE Diabetes Coordinator Inpatient Diabetes Program [phone] (Team Pager from 8am to 5pm)

## 2020-10-16 NOTE — TOC Progression Note (Addendum)
Transition of Care [facility]) - Progression Note    Patient Details  Name: Regina Watson MRN: [id number] Date of Birth: 01-Nov-1965  Transition of [facility]) CM/SW Contact  Boneta Lucks, RN Phone Number: 10/16/2020, 1:07 PM  Clinical Narrative:   [place] for patient to return to [facility] on 2/26. TOC started FL2 and left legal guardian a message with DC plan.      Addendum : Heloise Beecham, called back to confirm she had already made a visit with patient is confirmed she can go back to group home as planned.   Expected Discharge Plan and Services   Group Home   Readmission Risk Interventions Readmission Risk Prevention Plan 10/16/2020  Transportation Screening Complete  Home Care Screening Complete  Medication Review (RN CM) Complete  Some recent data might be hidden

## 2020-10-16 NOTE — Plan of Care (Signed)

## 2020-10-16 NOTE — Progress Notes (Signed)
[facility] Progress Note  Patient: Regina Watson    VPX:[id number]  DOA: 10/14/2020     Date of Service: the patient was seen and examined on 10/16/2020  Brief hospital course: Past medical history of blindness due to cataracts, type II DM, HTN, schizophrenia, anemia.  Presents with complaints of confusion and excessive sleepiness.  Concern for UTI currently on IV antibiotics.  Assessment and Plan: 1.  Acute metabolic encephalopathy in the setting of UTI as well as polypharmacy. Schizophrenia Patient presents with complaints of excessive sleepiness and lethargy compared to her baseline. CT head unremarkable for any acute abnormality.  No stroke like symptoms, exam nonfocal. Ammonia level normal.  UDS negative.  ABG and VBG unremarkable as well for any hypercarbia. Depakote level low but has been low in the past as well. No history of seizures no evidence of seizures here in the hospital. TSH free T4 normal. UA shows pyuria.  Urine culture growing gram-negative rods.  Following up on sensitivity. Currently on IV ceftriaxone. We will reduce gabapentin dose from 4 times daily to 3 times daily. Continue Depakote home regimen. Currently holding Risperdal due to patient's excessive sleepiness. Continue Seroquel 200 mg.  2.  Type 2 diabetes mellitus, uncontrolled with hyperglycemia long-term insulin dependence with blindness and neuropathy as well as HLD Hemoglobin A1c 8.2. Continue sliding scale insulin for now. On 20 units of Lantus twice daily at [facility].  Due to AMS we will continue half dose of that 20 units nightly only. Continue statin.  3.  Right upper quadrant abdominal pain Patient reported severe right upper quadrant pain. LFTs stable. Ultrasound negative for any gallbladder issues. X-ray shows severe constipation.  We will treat with bowel regimen.  4.  Acute kidney injury. Renal function on admission 1.5. Baseline creatinine 0.5-0.7. Patient was given IV hydration  with improvement in renal function. Currently worsening again after stopping IV fluids will monitor closely.  5.  Chronic leg swelling History of DVT in the past. Still continues to have swelling. Currently no tenderness. Repeat Dopplers.  6.  Glaucoma Continue eyedrops. Holding Diamox in the setting of AKI.  7.  Obesity. Placing the patient at high risk for poor outcomes especially with her immobility. Body mass index is 30.24 kg/m.   Diet: Dysphagia 3 diet DVT Prophylaxis:   heparin injection 5,000 Units Start: 10/15/20 0045 SCDs Start: 10/15/20 0037    Advance goals of care discussion: Full code  Family Communication: no family was present at bedside, at the time of interview.   Disposition:  Status is: Inpatient  Remains inpatient appropriate because:Ongoing diagnostic testing needed not appropriate for outpatient work up   Dispo: The patient is from: SNF              Anticipated d/c is to: SNF              Patient currently is not medically stable to d/c.   Difficult to place patient No        Subjective: No nausea no vomiting.  No fever no chills.  Significantly lethargic and sleepy this morning.  Mid conversation will go back to sleep.  No medication change.  CBG was 170. On repeat evaluation alert awake and oriented x3.  Does not remember seeing me earlier this morning.  Physical Exam:  General: Appear in mild distress, no Rash; Oral Mucosa Clear, moist. no Abnormal Neck Mass Or lumps, Conjunctiva normal  Cardiovascular: S1 and S2 Present, no Murmur, Respiratory: good respiratory effort, Bilateral Air entry  present and CTA, no Crackles, no wheezes Abdomen: Bowel Sound present, Soft and mild tenderness Extremities: trace Pedal edema Neurology: lethargic and oriented to place and person affect appropriate. no new focal deficit, asterixis negative Gait not checked due to patient safety concerns  Vitals:   10/15/20 1600 10/15/20 2229 10/16/20 0536  10/16/20 0916  BP: (!) 144/83 114/68 123/69   Pulse: 98 93 94   Resp: 18 18 19    Temp:  98 F (36.7 C) (!) 97.3 F (36.3 C)   TempSrc:  Oral Oral   SpO2: 100% 99% 100% 97%  Weight:      Height:        Intake/Output Summary (Last 24 hours) at 10/16/2020 1211 Last data filed at 10/16/2020 0900 Gross per 24 hour  Intake 309.22 ml  Output 1200 ml  Net -890.78 ml   Filed Weights   10/15/20 1103  Weight: 92.9 kg    Data Reviewed: I have personally reviewed and interpreted daily labs, tele strips, imaging. I reviewed all nursing notes, pharmacy notes, vitals, pertinent old records I have discussed plan of care as described above with RN and patient/family.  CBC: Recent Labs  Lab 10/14/20 2158 10/15/20 0100 10/15/20 0640 10/16/20 0516  WBC 5.2 5.6 6.4 5.9  NEUTROABS 2.7  --   --  2.6  HGB 15.4* 12.5 13.1 14.2  HCT 48.8* 38.9 40.2 44.8  MCV 96.8 94.6 93.3 95.5  PLT 238 261 283 195   Basic Metabolic Panel: Recent Labs  Lab 10/14/20 1753 10/15/20 0100 10/15/20 0640 10/16/20 0516  NA 135  --  135 135  K 4.5  --  3.7 3.4*  CL 104  --  103 102  CO2 17*  --  21* 24  GLUCOSE 231*  --  190* 266*  BUN 45*  --  28* 31*  CREATININE 1.53* 0.75 0.74 1.02*  CALCIUM 10.2  --  9.5 9.8  MG  --   --  1.8 2.1  PHOS  --   --  3.5  --     Studies: DG Abd Portable 1V  Result Date: 10/15/2020 CLINICAL DATA:  Right upper quadrant pain. EXAM: PORTABLE ABDOMEN - 1 VIEW COMPARISON:  None. FINDINGS: Moderate colorectal stool burden. Nonobstructive bowel gas pattern. No suspicious calcific densities. Osseous structures is within normal limits. IMPRESSION: No suspicious calcific densities. Normal bowel gas pattern. Electronically Signed   By: Primitivo Gauze M.D.   On: 10/15/2020 14:51   US Abdomen Limited RUQ (LIVER/GB)  Result Date: 10/16/2020 CLINICAL DATA:  Right upper quadrant abdominal pain. EXAM: ULTRASOUND ABDOMEN LIMITED RIGHT UPPER QUADRANT COMPARISON:  None. FINDINGS:  Gallbladder: No gallstones or wall thickening visualized. No sonographic Murphy sign noted by sonographer. Common bile duct: Diameter: 4 mm. Liver: The liver demonstrates coarse echotexture and mildly increased echogenicity, likely reflecting diffuse steatosis. No overt cirrhotic contour abnormalities or focal lesions are identified. There is no evidence of intrahepatic biliary ductal dilatation. The portal vein is patent on color Doppler imaging with normal direction of blood flow towards the liver. Other: None. IMPRESSION: Probable mild hepatic steatosis. Electronically Signed   By: Aletta Edouard M.D.   On: 10/16/2020 10:27    Scheduled Meds: . aspirin EC  81 mg Oral q morning  . atorvastatin  10 mg Oral QPM  . benztropine  0.5 mg Oral BID  . brimonidine  1 drop Left Eye TID  . colchicine  0.6 mg Oral Daily  . divalproex  500 mg Oral QHS  .  dorzolamide  1 drop Left Eye BID  . gabapentin  400 mg Oral TID  . heparin  5,000 Units Subcutaneous Q8H  . insulin aspart  0-15 Units Subcutaneous TID WC  . insulin aspart  0-5 Units Subcutaneous QHS  . insulin glargine  20 Units Subcutaneous QHS  . lactulose  20 g Oral BID  . polyethylene glycol  17 g Oral Daily  . prednisoLONE acetate  1 drop Both Eyes QID  . QUEtiapine  200 mg Oral QPM  . senna-docusate  1 tablet Oral BID  . timolol  1 drop Left Eye BID  . umeclidinium bromide  1 puff Inhalation Daily   Continuous Infusions: . cefTRIAXone (ROCEPHIN)  IV Stopped (10/15/20 2054)   PRN Meds: acetaminophen, traMADol  Time spent: 35 minutes  Author: Berle Mull, MD [facility] 10/16/2020 12:11 PM  To reach On-call, see care teams to locate the attending and reach out via [email or web address]. Between 7PM-7AM, please contact night-coverage If you still have difficulty reaching the attending provider, please page the [facility] (Director on Call) for [facility] on amion for assistance.

## 2020-10-17 DIAGNOSIS — R400 Somnolence: Secondary | ICD-10-CM | POA: Diagnosis not present

## 2020-10-17 LAB — CBC WITH DIFFERENTIAL/PLATELET
Abs Immature Granulocytes: 0.03 10*3/uL (ref 0.00–0.07)
Basophils Absolute: 0 10*3/uL (ref 0.0–0.1)
Basophils Relative: 0 %
Eosinophils Absolute: 0.2 10*3/uL (ref 0.0–0.5)
Eosinophils Relative: 3 %
HCT: 38 % (ref 36.0–46.0)
Hemoglobin: 12.4 g/dL (ref 12.0–15.0)
Immature Granulocytes: 1 %
Lymphocytes Relative: 46 %
Lymphs Abs: 2.8 10*3/uL (ref 0.7–4.0)
MCH: 30.6 pg (ref 26.0–34.0)
MCHC: 32.6 g/dL (ref 30.0–36.0)
MCV: 93.8 fL (ref 80.0–100.0)
Monocytes Absolute: 0.8 10*3/uL (ref 0.1–1.0)
Monocytes Relative: 14 %
Neutro Abs: 2.2 10*3/uL (ref 1.7–7.7)
Neutrophils Relative %: 36 %
Platelets: 259 10*3/uL (ref 150–400)
RBC: 4.05 MIL/uL (ref 3.87–5.11)
RDW: 12.1 % (ref 11.5–15.5)
WBC: 6 10*3/uL (ref 4.0–10.5)
nRBC: 0 % (ref 0.0–0.2)

## 2020-10-17 LAB — COMPREHENSIVE METABOLIC PANEL
ALT: 38 U/L (ref 0–44)
AST: 34 U/L (ref 15–41)
Albumin: 3.4 g/dL — ABNORMAL LOW (ref 3.5–5.0)
Alkaline Phosphatase: 80 U/L (ref 38–126)
Anion gap: 9 (ref 5–15)
BUN: 22 mg/dL — ABNORMAL HIGH (ref 6–20)
CO2: 22 mmol/L (ref 22–32)
Calcium: 9.6 mg/dL (ref 8.9–10.3)
Chloride: 105 mmol/L (ref 98–111)
Creatinine, Ser: 0.63 mg/dL (ref 0.44–1.00)
GFR, Estimated: >60 mL/min (ref >60)
Glucose, Bld: 262 mg/dL — ABNORMAL HIGH (ref 70–99)
Potassium: 3.7 mmol/L (ref 3.5–5.1)
Sodium: 136 mmol/L (ref 135–145)
Total Bilirubin: 0.3 mg/dL (ref 0.3–1.2)
Total Protein: 7.2 g/dL (ref 6.5–8.1)

## 2020-10-17 LAB — URINE CULTURE: Culture: >=100000 — AB

## 2020-10-17 LAB — MAGNESIUM: Magnesium: 1.7 mg/dL (ref 1.7–2.4)

## 2020-10-17 LAB — GLUCOSE, CAPILLARY
Glucose-Capillary: 263 mg/dL — ABNORMAL HIGH (ref 70–99)
Glucose-Capillary: 326 mg/dL — ABNORMAL HIGH (ref 70–99)

## 2020-10-17 MED ORDER — CEPHALEXIN 500 MG PO CAPS
500.0000 mg | ORAL_CAPSULE | Freq: Two times a day (BID) | ORAL | Status: DC
  Administered 2020-10-17: 500 mg via ORAL
  Filled 2020-10-17: qty 1

## 2020-10-17 MED ORDER — RISPERIDONE 1 MG PO TABS
1.0000 mg | ORAL_TABLET | Freq: Two times a day (BID) | ORAL | 0 refills | Status: DC
Start: 2020-10-17 — End: 2021-01-18

## 2020-10-17 MED ORDER — FUROSEMIDE 40 MG PO TABS: 40.0000 mg | ORAL_TABLET | Freq: Every day | ORAL | Status: DC | PRN

## 2020-10-17 MED ORDER — CEPHALEXIN 500 MG PO CAPS: 500.0000 mg | ORAL_CAPSULE | Freq: Two times a day (BID) | ORAL | 0 refills | Status: AC

## 2020-10-17 MED ORDER — GABAPENTIN 400 MG PO CAPS: 400.0000 mg | ORAL_CAPSULE | Freq: Three times a day (TID) | ORAL | 0 refills | Status: DC

## 2020-10-17 MED ORDER — LACTULOSE 10 GM/15ML PO SOLN: 20.0000 g | Freq: Two times a day (BID) | ORAL | 0 refills | Status: DC

## 2020-10-17 NOTE — TOC Transition Note (Signed)
Transition of Care [facility]) - CM/SW Discharge Note   Patient Details  Name: Regina Watson MRN: [id number] Date of Birth: 08-30-1965  Transition of Care [facility]) CM/SW Contact:  Natasha Bence, LCSW Phone Number: 10/17/2020, 12:53 PM   Clinical Narrative:    CSW contacted [facility] to notify them of patient's discharge. [facility] agreeable to take patient back to ALF. CSW faxed FL2 and d/c summary to provided fax number [phone]. TOC signing off.   Final next level of care: Assisted Living Barriers to Discharge: Barriers Resolved   Patient Goals and CMS Choice Patient states their goals for this hospitalization and ongoing recovery are:: Return to ALF CMS Medicare.gov Compare Post Acute Care list provided to:: Patient Choice offered to / list presented to : Patient  Discharge Placement                Patient to be transferred to facility by: [facility] Name of family member notified: Heloise Beecham Patient and family notified of of transfer: 10/17/20  Discharge Plan and Services                DME Arranged: N/A DME Agency: NA       HH Arranged: NA HH Agency: NA        Social Determinants of Health ([place]) Interventions     Readmission Risk Interventions Readmission Risk Prevention Plan 10/16/2020  Transportation Screening Complete  Home Care Screening Complete  Medication Review (RN CM) Complete  Some recent data might be hidden

## 2020-10-17 NOTE — NC FL2 (Signed)
[place] LEVEL OF CARE SCREENING TOOL     IDENTIFICATION  Patient Name: Regina Watson Birthdate: March 01, 1966 Sex: female Admission Date (Current Location): 10/14/2020  [facility] and [place] Number:  [employer] and Address:  [facility] [address] [place]      Provider Number: [id number]  Attending Physician Name and Address:  Lavina Hamman, MD  Relative Name and Phone Number:  Antony Blackbird - legal Guardian  [phone]    Current Level of Care: Hospital Recommended Level of Care: Other (Comment) ([facility]) Prior Approval Number:    Date Approved/Denied:   PASRR Number:    Discharge Plan: Domiciliary (Rest home)    Current Diagnoses: Patient Active Problem List   Diagnosis Date Noted  . Altered mental status 10/15/2020  . Leukocytosis 10/15/2020  . AKI (acute kidney injury) ([place]) 10/15/2020  . Hyperglycemia due to diabetes mellitus ([place]) 10/15/2020  . Hyperlipidemia 10/15/2020  . Diabetic neuropathy ([place]) 10/15/2020  . UTI (urinary tract infection) 10/15/2019  . Encephalopathy acute 10/14/2019  . Schizophrenia ([place]) 10/14/2019  . Type 2 diabetes mellitus without complication ([place]) 21/19/4174  . Acute encephalopathy 10/14/2019  . Palpitations 10/04/2016  . Essential hypertension 10/04/2016  . Tobacco use 10/04/2016  . Tachycardia 10/04/2016    Orientation RESPIRATION BLADDER Height & Weight     Self  Normal Continent Weight: 204 lb 12.9 oz (92.9 kg) Height:  5\' 9"  (175.3 cm)  BEHAVIORAL SYMPTOMS/MOOD NEUROLOGICAL BOWEL NUTRITION STATUS      Continent Diet (see DC summary)  AMBULATORY STATUS COMMUNICATION OF NEEDS Skin   Extensive Assist Verbally Other (Comment) (arms and legs eczema)                       Personal Care Assistance Level of Assistance  Bathing,Dressing,Feeding Bathing Assistance: Maximum assistance Feeding assistance: Limited assistance Dressing Assistance:  Maximum assistance     Functional Limitations Info  Sight,Hearing,Speech Sight Info: Impaired Hearing Info: Impaired Speech Info: Adequate    SPECIAL CARE FACTORS FREQUENCY                       Contractures Contractures Info: Not present    Additional Factors Info  Code Status,Psychotropic,Insulin Sliding Scale,Allergies Code Status Info: Full Allergies Info: Bactrim, Penicillins, Latex Psychotropic Info: ACETAZOLAMIDE ER PO, benztropine, divalproex, gabapentin, QUEtiapine, risperiDONE, Insulin Sliding Scale Info: SLIDING SCALE:   150-200= 11 units   201-250= 12 units   251-300= 13 units   301-350= 14 units   351-400= 15 units   >400= 16 units   Call if less than 70 or greater than 300 for 3 tests In a row       Current Medications (10/17/2020):  This is the current hospital active medication list Current Facility-Administered Medications  Medication Dose Route Frequency Provider Last Rate Last Admin  . acetaminophen (TYLENOL) tablet 650 mg  650 mg Oral Q4H PRN Lavina Hamman, MD      . aspirin EC tablet 81 mg  81 mg Oral q morning Lavina Hamman, MD   81 mg at 10/17/20 0814  . atorvastatin (LIPITOR) tablet 10 mg  10 mg Oral QPM Adefeso, Oladapo, DO   10 mg at 10/16/20 1743  . benztropine (COGENTIN) tablet 0.5 mg  0.5 mg Oral BID Lavina Hamman, MD   0.5 mg at 10/17/20 0809  . brimonidine (ALPHAGAN) 0.2 % ophthalmic solution 1 drop  1 drop  Left Eye TID Lavina Hamman, MD   1 drop at 10/17/20 4696  . cephALEXin (KEFLEX) capsule 500 mg  500 mg Oral Q12H Lavina Hamman, MD   500 mg at 10/17/20 1201  . colchicine tablet 0.6 mg  0.6 mg Oral Daily Lavina Hamman, MD   0.6 mg at 10/17/20 0809  . divalproex (DEPAKOTE ER) 24 hr tablet 500 mg  500 mg Oral QHS Lavina Hamman, MD   500 mg at 10/16/20 2223  . dorzolamide (TRUSOPT) 2 % ophthalmic solution 1 drop  1 drop Left Eye BID Lavina Hamman, MD   1 drop at 10/17/20 [phone]  . gabapentin (NEURONTIN) capsule 400 mg  400 mg Oral  TID Lavina Hamman, MD   400 mg at 10/17/20 0809  . heparin injection 5,000 Units  5,000 Units Subcutaneous Q8H Adefeso, Oladapo, DO   5,000 Units at 10/17/20 0546  . insulin aspart (novoLOG) injection 0-15 Units  0-15 Units Subcutaneous TID WC Adefeso, Oladapo, DO   11 Units at 10/17/20 1156  . insulin aspart (novoLOG) injection 0-5 Units  0-5 Units Subcutaneous QHS Adefeso, Oladapo, DO   4 Units at 10/16/20 2224  . insulin glargine (LANTUS) injection 20 Units  20 Units Subcutaneous QHS Lavina Hamman, MD   20 Units at 10/16/20 2223  . lactulose (CHRONULAC) 10 GM/15ML solution 20 g  20 g Oral BID Lavina Hamman, MD   20 g at 10/17/20 0809  . polyethylene glycol (MIRALAX / GLYCOLAX) packet 17 g  17 g Oral Daily Lavina Hamman, MD   17 g at 10/17/20 0809  . prednisoLONE acetate (PRED FORTE) 1 % ophthalmic suspension 1 drop  1 drop Both Eyes QID Lavina Hamman, MD   1 drop at 10/17/20 [phone]  . QUEtiapine (SEROQUEL XR) 24 hr tablet 200 mg  200 mg Oral QPM Adefeso, Oladapo, DO   200 mg at 10/16/20 1743  . senna-docusate (Senokot-S) tablet 1 tablet  1 tablet Oral BID Lavina Hamman, MD   1 tablet at 10/17/20 0809  . timolol (TIMOPTIC) 0.5 % ophthalmic solution 1 drop  1 drop Left Eye BID Lavina Hamman, MD   1 drop at 10/17/20 2440  . traMADol (ULTRAM) tablet 50 mg  50 mg Oral Q6H PRN Lavina Hamman, MD   50 mg at 10/16/20 2222  . umeclidinium bromide (INCRUSE ELLIPTA) 62.5 MCG/INH 1 puff  1 puff Inhalation Daily Lavina Hamman, MD   1 puff at 10/17/20 1027     Discharge Medications: Bactrim [sulfamethoxazole-trimethoprim] Hives    Penicillins    Has patient had a PCN reaction causing immediate rash, facial/tongue/throat swelling, SOB or lightheadedness with hypotension: UNKNOWN Has patient had a PCN reaction causing severe rash involving mucus membranes or skin necrosis: UNKNOWN Has patient had a PCN reaction that required hospitalization: UNKNOWN Has patient had a PCN reaction occurring  within the last 10 years: UNKNOWN If all of the above answers are "NO", then may proceed with Cephalosporin use.   Latex Itching         Medication List    STOP taking these medications   hydrOXYzine 25 MG tablet Commonly known as: ATARAX/VISTARIL   lactulose (encephalopathy) 10 GM/15ML Soln Commonly known as: CHRONULAC   lisinopril 20 MG tablet Commonly known as: ZESTRIL     TAKE these medications   acetaminophen 325 MG tablet Commonly known as: TYLENOL Take 650 mg by mouth every 4 (four) hours as needed  for mild pain or moderate pain.   ACETAZOLAMIDE ER PO Take 1 tablet by mouth in the morning and at bedtime.   aspirin EC 81 MG tablet Take 81 mg by mouth every morning.   atorvastatin 10 MG tablet Commonly known as: LIPITOR Take 10 mg by mouth every evening.   benztropine 0.5 MG tablet Commonly known as: COGENTIN Take 0.5 mg by mouth 2 (two) times daily.   brimonidine 0.2 % ophthalmic solution Commonly known as: ALPHAGAN Place 1 drop into the left eye 3 (three) times daily.   cephALEXin 500 MG capsule Commonly known as: KEFLEX Take 1 capsule (500 mg total) by mouth 2 (two) times daily for 3 days.   clobetasol 0.05 % Gel Commonly known as: TEMOVATE Apply 1 application topically 2 (two) times daily.   divalproex 500 MG 24 hr tablet Commonly known as: DEPAKOTE ER Take 1 tablet (500 mg total) by mouth at bedtime.   dorzolamide 2 % ophthalmic solution Commonly known as: TRUSOPT Place 1 drop into the left eye 2 (two) times daily.   furosemide 40 MG tablet Commonly known as: LASIX Take 1 tablet (40 mg total) by mouth daily as needed for fluid or edema. What changed:   when to take this  reasons to take this   gabapentin 400 MG capsule Commonly known as: NEURONTIN Take 1 capsule (400 mg total) by mouth 3 (three) times daily. What changed: when to take this   insulin aspart 100 UNIT/ML FlexPen Commonly known as: NOVOLOG Inject  11-16 Units into the skin 3 (three) times daily with meals. SLIDING SCALE: 150-200= 11 units 201-250= 12 units 251-300= 13 units 301-350= 14 units 351-400= 15 units >400=      16 units Call if less than 70 or greater than 300 for 3 tests  In a row   insulin glargine 100 UNIT/ML injection Commonly known as: LANTUS Inject 0.14 mLs (14 Units total) into the skin at bedtime. What changed:   how much to take  when to take this   lactulose 10 GM/15ML solution Commonly known as: CHRONULAC Take 30 mLs (20 g total) by mouth 2 (two) times daily.   levocetirizine 5 MG tablet Commonly known as: XYZAL Take 5 mg by mouth every evening.   metFORMIN 1000 MG tablet Commonly known as: GLUCOPHAGE Take 1,000 mg by mouth 2 (two) times daily with a meal.   Minoxidil 5 % Foam Apply 1 application topically in the morning and at bedtime.   Mitigare 0.6 MG Caps Generic drug: Colchicine Take 1 capsule by mouth daily. What changed: Another medication with the same name was removed. Continue taking this medication, and follow the directions you see here.   nicotine polacrilex 4 MG gum Commonly known as: NICORETTE Take 4 mg by mouth See admin instructions. Chew one piece 24 times per day for 30 minutes as needed   polyethylene glycol 17 g packet Commonly known as: MIRALAX / GLYCOLAX Take 17 g by mouth daily.   prednisoLONE acetate 1 % ophthalmic suspension Commonly known as: PRED FORTE Place 1 drop into both eyes 4 (four) times daily.   QUEtiapine 200 MG 24 hr tablet Commonly known as: SEROQUEL XR Take 1 tablet (200 mg total) by mouth every evening. What changed: how much to take   risperiDONE 1 MG tablet Commonly known as: RISPERDAL Take 1 tablet (1 mg total) by mouth 2 (two) times daily. What changed: how much to take   sennosides-docusate sodium 8.6-50 MG tablet Commonly known  as: SENOKOT-S Take 1 tablet by mouth in the morning and at bedtime.   timolol 0.5 %  ophthalmic solution Commonly known as: TIMOPTIC Place 1 drop into the left eye 2 (two) times daily.   tiotropium 18 MCG inhalation capsule Commonly known as: SPIRIVA Place 18 mcg into inhaler and inhale daily.   triamcinolone ointment 0.1 % Commonly known as: KENALOG Apply 1 application topically 2 (two) times daily.   Vitamin D (Ergocalciferol) 1.25 MG (50000 UNIT) Caps capsule Commonly known as: DRISDOL Take 50,000 Units by mouth every 7 (seven) days.       Relevant Imaging Results:  Relevant Lab Results:   Additional Information SS# [id number]  Natasha Bence, LCSW

## 2020-10-17 NOTE — Discharge Summary (Signed)
[facility] Discharge Summary   Patient: Regina Watson EYC:[id number]  PCP: Megan Mans, NP  Date of admission: 10/14/2020   Date of discharge:  10/17/2020     Discharge Diagnoses:  Principal Problem:   Altered mental status Active Problems:   Essential hypertension   Schizophrenia ([place])   UTI (urinary tract infection)   Leukocytosis   AKI (acute kidney injury) ([place])   Hyperglycemia due to diabetes mellitus ([place])   Hyperlipidemia   Diabetic neuropathy ([place])  Admitted From: home Disposition:  Home   Recommendations for Outpatient Follow-up:  1. PCP: please follow up with PCP in 1 week 2. Follow up LABS/TEST:  none   Follow-up Information    Megan Mans, NP. Schedule an appointment as soon as possible for a visit in 1 week(s).   Specialty: Nurse Practitioner Contact information: [address] [place] [address] [phone]        Psychiatry. Schedule an appointment as soon as possible for a visit in 1 month(s).              Discharge Instructions    Diet - low sodium heart healthy   Complete by: As directed    Increase activity slowly   Complete by: As directed       Diet recommendation: Cardiac diet  Activity: The patient is advised to gradually reintroduce usual activities, as tolerated  Discharge Condition: stable  Code Status: Full code   History of present illness: As per the H and P dictated on admission, "Regina Watson is a 55 y.o. 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  Patient states that she felt very sleepy and tired at the nursing facility, and the nursing staff felt that she had altered mental status and she was sent to the ED for further evaluation and management.  Patient endorsed burning sensation on urination that has been going on for several days, but she denies  fever, chills, chest pain, shortness of breath, nausea, vomiting or abdominal pain.  ED Course:  In the emergency department, she was tachycardic, but other vital signs are within normal range.  Work-up in the ED showed leukocytosis, BUN/creatinine 45/1.53 (baseline creatinine at 0.5-0.7), sodium 135, potassium 4.5, chloride 104, bicarb 17, glucose 231.  Urinalysis was unimpressive for UTI, urine drug screen was negative, BNP 21. Chest x-ray showed pulmonary vascular congestion.  CT of head without contrast showed no acute intracranial findings.  Patient was started on IV ceftriaxone due to UTI, IV hydration was provided.  Hospitalist was asked to admit patient for further evaluation and management."  Hospital Course:  Summary of her active problems in the hospital is as following. 1.  Acute metabolic encephalopathy in the setting of UTI as well as polypharmacy. Schizophrenia Patient presents with complaints of excessive sleepiness and lethargy compared to her baseline. CT head unremarkable for any acute abnormality.  No stroke like symptoms, exam nonfocal. Ammonia level normal.  UDS negative.  ABG and VBG unremarkable as well for any hypercarbia. Depakote level low but has been low in the past as well. No history of seizures no evidence of seizures here in the hospital. TSH free T4 normal. UA shows pyuria.  Urine culture growing klebsiella, Currently on IV ceftriaxone switch to keflex.  We will reduce gabapentin dose from 4 times daily to 3 times daily. Continue Depakote home regimen. Risperdal was on hold in  hospital.  Currently reducing Risperdal from 3 bid to 1 bid due to patient's excessive sleepiness. Continue Seroquel 200 mg.  2.  Type 2 diabetes mellitus, uncontrolled with hyperglycemia long-term insulin dependence with blindness and neuropathy as well as HLD Hemoglobin A1c 8.2. On 20 units of Lantus twice daily at home. resume Continue statin.  3.  Right upper quadrant  abdominal pain Constipation  Patient reported severe right upper quadrant pain. LFTs stable. Ultrasound negative for any gallbladder issues. X-ray shows severe constipation. Continue treat with bowel regimen.  4.  Acute kidney injury. Renal function on admission 1.5. Baseline creatinine 0.5-0.7. Patient was given IV hydration with improvement in renal function. Stable after stopping IV fluids.  5.  Chronic leg swelling History of DVT in the past. Still continues to have swelling. Currently no tenderness. Repeat Dopplers.  6.  Glaucoma Continue eyedrops. Monitor the dose of Diamox.  7.  Obesity. Placing the patient at high risk for poor outcomes especially with her immobility. Body mass index is 30.24 kg/m.   On the day of the discharge the patient's vitals were stable, and no other acute medical condition were reported by patient. The patient was felt safe to be discharge at group home.  Consultants: none Procedures: none  DISCHARGE MEDICATION: Allergies as of 10/17/2020      Reactions   Bactrim [sulfamethoxazole-trimethoprim] Hives   Penicillins    Has patient had a PCN reaction causing immediate rash, facial/tongue/throat swelling, SOB or lightheadedness with hypotension: UNKNOWN Has patient had a PCN reaction causing severe rash involving mucus membranes or skin necrosis: UNKNOWN Has patient had a PCN reaction that required hospitalization: UNKNOWN Has patient had a PCN reaction occurring within the last 10 years: UNKNOWN If all of the above answers are "NO", then may proceed with Cephalosporin use.   Latex Itching      Medication List    STOP taking these medications   hydrOXYzine 25 MG tablet Commonly known as: ATARAX/VISTARIL   lactulose (encephalopathy) 10 GM/15ML Soln Commonly known as: CHRONULAC   lisinopril 20 MG tablet Commonly known as: ZESTRIL     TAKE these medications   acetaminophen 325 MG tablet Commonly known as: TYLENOL Take 650 mg by  mouth every 4 (four) hours as needed for mild pain or moderate pain.   ACETAZOLAMIDE ER PO Take 1 tablet by mouth in the morning and at bedtime.   aspirin EC 81 MG tablet Take 81 mg by mouth every morning.   atorvastatin 10 MG tablet Commonly known as: LIPITOR Take 10 mg by mouth every evening.   benztropine 0.5 MG tablet Commonly known as: COGENTIN Take 0.5 mg by mouth 2 (two) times daily.   brimonidine 0.2 % ophthalmic solution Commonly known as: ALPHAGAN Place 1 drop into the left eye 3 (three) times daily.   cephALEXin 500 MG capsule Commonly known as: KEFLEX Take 1 capsule (500 mg total) by mouth 2 (two) times daily for 3 days.   clobetasol 0.05 % Gel Commonly known as: TEMOVATE Apply 1 application topically 2 (two) times daily.   divalproex 500 MG 24 hr tablet Commonly known as: DEPAKOTE ER Take 1 tablet (500 mg total) by mouth at bedtime.   dorzolamide 2 % ophthalmic solution Commonly known as: TRUSOPT Place 1 drop into the left eye 2 (two) times daily.   furosemide 40 MG tablet Commonly known as: LASIX Take 1 tablet (40 mg total) by mouth daily as needed for fluid or edema. What changed:   when to  take this  reasons to take this   gabapentin 400 MG capsule Commonly known as: NEURONTIN Take 1 capsule (400 mg total) by mouth 3 (three) times daily. What changed: when to take this   insulin aspart 100 UNIT/ML FlexPen Commonly known as: NOVOLOG Inject 11-16 Units into the skin 3 (three) times daily with meals. SLIDING SCALE: 150-200= 11 units 201-250= 12 units 251-300= 13 units 301-350= 14 units 351-400= 15 units >400=      16 units Call if less than 70 or greater than 300 for 3 tests  In a row   insulin glargine 100 UNIT/ML injection Commonly known as: LANTUS Inject 0.14 mLs (14 Units total) into the skin at bedtime. What changed:   how much to take  when to take this   lactulose 10 GM/15ML solution Commonly known as: CHRONULAC Take 30 mLs  (20 g total) by mouth 2 (two) times daily.   levocetirizine 5 MG tablet Commonly known as: XYZAL Take 5 mg by mouth every evening.   metFORMIN 1000 MG tablet Commonly known as: GLUCOPHAGE Take 1,000 mg by mouth 2 (two) times daily with a meal.   Minoxidil 5 % Foam Apply 1 application topically in the morning and at bedtime.   Mitigare 0.6 MG Caps Generic drug: Colchicine Take 1 capsule by mouth daily. What changed: Another medication with the same name was removed. Continue taking this medication, and follow the directions you see here.   nicotine polacrilex 4 MG gum Commonly known as: NICORETTE Take 4 mg by mouth See admin instructions. Chew one piece 24 times per day for 30 minutes as needed   polyethylene glycol 17 g packet Commonly known as: MIRALAX / GLYCOLAX Take 17 g by mouth daily.   prednisoLONE acetate 1 % ophthalmic suspension Commonly known as: PRED FORTE Place 1 drop into both eyes 4 (four) times daily.   QUEtiapine 200 MG 24 hr tablet Commonly known as: SEROQUEL XR Take 1 tablet (200 mg total) by mouth every evening. What changed: how much to take   risperiDONE 1 MG tablet Commonly known as: RISPERDAL Take 1 tablet (1 mg total) by mouth 2 (two) times daily. What changed: how much to take   sennosides-docusate sodium 8.6-50 MG tablet Commonly known as: SENOKOT-S Take 1 tablet by mouth in the morning and at bedtime.   timolol 0.5 % ophthalmic solution Commonly known as: TIMOPTIC Place 1 drop into the left eye 2 (two) times daily.   tiotropium 18 MCG inhalation capsule Commonly known as: SPIRIVA Place 18 mcg into inhaler and inhale daily.   triamcinolone ointment 0.1 % Commonly known as: KENALOG Apply 1 application topically 2 (two) times daily.   Vitamin D (Ergocalciferol) 1.25 MG (50000 UNIT) Caps capsule Commonly known as: DRISDOL Take 50,000 Units by mouth every 7 (seven) days.       Discharge Exam: Filed Weights   10/15/20 1103   Weight: 92.9 kg   Vitals:   10/17/20 0558 10/17/20 0808  BP: 135/81   Pulse: 84   Resp: 16   Temp: 98 F (36.7 C)   SpO2: 96% 96%   General: Appear in mild distress; no visible Abnormal Neck Mass Or lumps, Conjunctiva normal Cardiovascular: S1 and S2 Present, no Murmur, Respiratory: good respiratory effort, Bilateral Air entry present and CTA, no Crackles, no wheezes Abdomen: Bowel Sound present Extremities: trace Pedal edema Neurology: alert and oriented to place and person Gait not checked due to patient safety concerns  The results of significant  diagnostics from this hospitalization (including imaging, microbiology, ancillary and laboratory) are listed below for reference.    Significant Diagnostic Studies: DG Chest 1 View  Result Date: 10/14/2020 CLINICAL DATA:  Altered mental status. Patient unable to provide history. Weakness. EXAM: CHEST  1 VIEW COMPARISON:  09/27/2019 FINDINGS: Heart size and mediastinal contours appear normal. Pulmonary vascular congestion noted. No pleural effusion or edema. No airspace densities. Visualized osseous structures are unremarkable. IMPRESSION: Pulmonary vascular congestion. Electronically Signed   By: Kerby Moors M.D.   On: 10/14/2020 17:08   CT HEAD WO CONTRAST  Result Date: 10/14/2020 CLINICAL DATA:  Altered mental status and lethargic EXAM: CT HEAD WITHOUT CONTRAST TECHNIQUE: Contiguous axial images were obtained from the base of the skull through the vertex without intravenous contrast. COMPARISON:  Head CT October 14, 2019 FINDINGS: Brain: No evidence of acute infarction, hemorrhage, hydrocephalus, extra-axial collection or mass lesion/mass effect. Similar moderate global parenchymal atrophy advanced for age. Mild burden of chronic small vessel ischemic white matter disease, similar prior. 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 Vascular: No hyperdense vessel or unexpected  calcification. Skull: Normal. Negative for fracture or focal lesion. Sinuses/Orbits: No acute finding. Other: None. IMPRESSION: 1. No acute intracranial findings. 2. Similar moderate global parenchymal atrophy advanced for age. 3. 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 Electronically Signed   By: Dahlia Bailiff MD   On: 10/14/2020 16:52   DG Abd Portable 1V  Result Date: 10/15/2020 CLINICAL DATA:  Right upper quadrant pain. EXAM: PORTABLE ABDOMEN - 1 VIEW COMPARISON:  None. FINDINGS: Moderate colorectal stool burden. Nonobstructive bowel gas pattern. No suspicious calcific densities. Osseous structures is within normal limits. IMPRESSION: No suspicious calcific densities. Normal bowel gas pattern. Electronically Signed   By: Primitivo Gauze M.D.   On: 10/15/2020 14:51   US Abdomen Limited RUQ (LIVER/GB)  Result Date: 10/16/2020 CLINICAL DATA:  Right upper quadrant abdominal pain. EXAM: ULTRASOUND ABDOMEN LIMITED RIGHT UPPER QUADRANT COMPARISON:  None. FINDINGS: Gallbladder: No gallstones or wall thickening visualized. No sonographic Murphy sign noted by sonographer. Common bile duct: Diameter: 4 mm. Liver: The liver demonstrates coarse echotexture and mildly increased echogenicity, likely reflecting diffuse steatosis. No overt cirrhotic contour abnormalities or focal lesions are identified. There is no evidence of intrahepatic biliary ductal dilatation. The portal vein is patent on color Doppler imaging with normal direction of blood flow towards the liver. Other: None. IMPRESSION: Probable mild hepatic steatosis. Electronically Signed   By: Aletta Edouard M.D.   On: 10/16/2020 10:27    Microbiology: Recent Results (from the past 240 hour(s))  SARS CORONAVIRUS 2 (TAT 6-24 HRS) Nasopharyngeal Nasopharyngeal Swab     Status: None   Collection Time: 10/14/20  7:01 PM   Specimen: Nasopharyngeal Swab  Result Value Ref Range Status   SARS  Coronavirus 2 NEGATIVE NEGATIVE Final    Comment: (NOTE) SARS-CoV-2 target nucleic acids are NOT DETECTED.  The SARS-CoV-2 RNA is generally detectable in upper and lower respiratory specimens during the acute phase of infection. Negative results do not preclude SARS-CoV-2 infection, do not rule out co-infections with other pathogens, and should not be used as the sole basis for treatment or other patient management decisions. Negative results must be combined with clinical observations, patient history, and epidemiological information. The expected result is Negative.  Fact Sheet for Patients: SugarRoll.be  Fact Sheet for Healthcare Providers: https://www.woods-mathews.com/  This test is not yet  approved or cleared by the [place] and  has been authorized for detection and/or diagnosis of SARS-CoV-2 by FDA under an Emergency Use Authorization (EUA). This EUA will remain  in effect (meaning this test can be used) for the duration of the COVID-19 declaration under Se ction 564(b)(1) of the Act, 21 U.S.C. section 360bbb-3(b)(1), unless the authorization is terminated or revoked sooner.  Performed at [facility], [place] [address] [place], [place] [address]   Urine culture     Status: Abnormal   Collection Time: 10/14/20  8:41 PM   Specimen: Urine, Clean Catch  Result Value Ref Range Status   Specimen Description   Final    URINE, CLEAN CATCH Performed at [facility], [address], [place], [place] [address]    Special Requests   Final    NONE Performed at [facility], [address] [place], [place] [address]    Culture >=100,000 COLONIES/mL KLEBSIELLA PNEUMONIAE (A)  Final   Report Status 10/17/2020 FINAL  Final   Organism ID, Bacteria KLEBSIELLA PNEUMONIAE (A)  Final      Susceptibility   Klebsiella pneumoniae - MIC*    AMPICILLIN >=32 RESISTANT Resistant     CEFAZOLIN <=4 SENSITIVE Sensitive     CEFEPIME <=0.12  SENSITIVE Sensitive     CEFTRIAXONE <=0.25 SENSITIVE Sensitive     CIPROFLOXACIN <=0.25 SENSITIVE Sensitive     GENTAMICIN <=1 SENSITIVE Sensitive     IMIPENEM <=0.25 SENSITIVE Sensitive     NITROFURANTOIN <=16 SENSITIVE Sensitive     TRIMETH/SULFA <=20 SENSITIVE Sensitive     AMPICILLIN/SULBACTAM >=32 RESISTANT Resistant     PIP/TAZO <=4 SENSITIVE Sensitive     * >=100,000 COLONIES/mL KLEBSIELLA PNEUMONIAE  MRSA PCR Screening     Status: None   Collection Time: 10/15/20  1:18 PM   Specimen: Nasopharyngeal  Result Value Ref Range Status   MRSA by PCR NEGATIVE NEGATIVE Final    Comment:        The GeneXpert MRSA Assay (FDA approved for NASAL specimens only), is one component of a comprehensive MRSA colonization surveillance program. It is not intended to diagnose MRSA infection nor to guide or monitor treatment for MRSA infections. Performed at [facility], [address], [place], [place] [address]      Labs: CBC: Recent Labs  Lab 10/14/20 2158 10/15/20 0100 10/15/20 0640 10/16/20 0516 10/17/20 0449  WBC 5.2 5.6 6.4 5.9 6.0  NEUTROABS 2.7  --   --  2.6 2.2  HGB 15.4* 12.5 13.1 14.2 12.4  HCT 48.8* 38.9 40.2 44.8 38.0  MCV 96.8 94.6 93.3 95.5 93.8  PLT 238 261 283 270 789   Basic Metabolic Panel: Recent Labs  Lab 10/14/20 1753 10/15/20 0100 10/15/20 0640 10/16/20 0516 10/17/20 0449  NA 135  --  135 135 136  K 4.5  --  3.7 3.4* 3.7  CL 104  --  103 102 105  CO2 17*  --  21* 24 22  GLUCOSE 231*  --  190* 266* 262*  BUN 45*  --  28* 31* 22*  CREATININE 1.53* 0.75 0.74 1.02* 0.63  CALCIUM 10.2  --  9.5 9.8 9.6  MG  --   --  1.8 2.1 1.7  PHOS  --   --  3.5  --   --    Liver Function Tests: Recent Labs  Lab 10/14/20 1753 10/15/20 0640 10/16/20 0516 10/17/20 0449  AST 28 29 35 34  ALT 27 27 35  38  ALKPHOS 91 85 86 80  BILITOT 0.5 0.5 0.5 0.3  PROT 8.7* 8.2* 7.8 7.2  ALBUMIN 4.3 4.0 3.7 3.4*   CBG: Recent Labs  Lab 10/16/20 0740 10/16/20 1121  10/16/20 1630 10/16/20 2049 10/17/20 0737  GLUCAP 275* 176* 276* 347* 263*   Time spent: 35 minutes  Signed:  Berle Mull  [facility]  10/17/2020 10:44 AM

## 2020-11-11 ENCOUNTER — Encounter (INDEPENDENT_AMBULATORY_CARE_PROVIDER_SITE_OTHER): Payer: Self-pay | Admitting: *Deleted

## 2020-12-01 ENCOUNTER — Other Ambulatory Visit: Payer: Medicaid Other | Admitting: Adult Health

## 2021-01-17 ENCOUNTER — Emergency Department (HOSPITAL_COMMUNITY): Payer: Medicaid Other

## 2021-01-17 ENCOUNTER — Other Ambulatory Visit: Payer: Self-pay

## 2021-01-17 ENCOUNTER — Inpatient Hospital Stay (HOSPITAL_COMMUNITY)
Admission: EM | Admit: 2021-01-17 | Discharge: 2021-01-20 | DRG: 689 | Disposition: A | Discharge status: Nursing Home (Includes ICF) | Payer: Medicaid Other | Source: Skilled Nursing Facility | Attending: Internal Medicine | Admitting: Internal Medicine

## 2021-01-17 ENCOUNTER — Encounter (HOSPITAL_COMMUNITY): Payer: Self-pay

## 2021-01-17 DIAGNOSIS — N12 Tubulo-interstitial nephritis, not specified as acute or chronic: Principal | ICD-10-CM | POA: Diagnosis present

## 2021-01-17 DIAGNOSIS — Z91040 Latex allergy status: Secondary | ICD-10-CM

## 2021-01-17 DIAGNOSIS — E785 Hyperlipidemia, unspecified: Secondary | ICD-10-CM | POA: Diagnosis present

## 2021-01-17 DIAGNOSIS — Z7982 Long term (current) use of aspirin: Secondary | ICD-10-CM

## 2021-01-17 DIAGNOSIS — B961 Klebsiella pneumoniae [K. pneumoniae] as the cause of diseases classified elsewhere: Secondary | ICD-10-CM | POA: Diagnosis present

## 2021-01-17 DIAGNOSIS — Z7984 Long term (current) use of oral hypoglycemic drugs: Secondary | ICD-10-CM

## 2021-01-17 DIAGNOSIS — Z794 Long term (current) use of insulin: Secondary | ICD-10-CM

## 2021-01-17 DIAGNOSIS — F209 Schizophrenia, unspecified: Secondary | ICD-10-CM | POA: Diagnosis present

## 2021-01-17 DIAGNOSIS — R112 Nausea with vomiting, unspecified: Secondary | ICD-10-CM

## 2021-01-17 DIAGNOSIS — N858 Other specified noninflammatory disorders of uterus: Secondary | ICD-10-CM

## 2021-01-17 DIAGNOSIS — N859 Noninflammatory disorder of uterus, unspecified: Secondary | ICD-10-CM | POA: Diagnosis present

## 2021-01-17 DIAGNOSIS — F17210 Nicotine dependence, cigarettes, uncomplicated: Secondary | ICD-10-CM | POA: Diagnosis present

## 2021-01-17 DIAGNOSIS — Z1611 Resistance to penicillins: Secondary | ICD-10-CM | POA: Diagnosis present

## 2021-01-17 DIAGNOSIS — Z20822 Contact with and (suspected) exposure to covid-19: Secondary | ICD-10-CM | POA: Diagnosis present

## 2021-01-17 DIAGNOSIS — G9340 Encephalopathy, unspecified: Secondary | ICD-10-CM

## 2021-01-17 DIAGNOSIS — K5900 Constipation, unspecified: Secondary | ICD-10-CM | POA: Diagnosis present

## 2021-01-17 DIAGNOSIS — R7989 Other specified abnormal findings of blood chemistry: Secondary | ICD-10-CM

## 2021-01-17 DIAGNOSIS — F203 Undifferentiated schizophrenia: Secondary | ICD-10-CM | POA: Diagnosis present

## 2021-01-17 DIAGNOSIS — N390 Urinary tract infection, site not specified: Secondary | ICD-10-CM | POA: Diagnosis present

## 2021-01-17 DIAGNOSIS — E860 Dehydration: Secondary | ICD-10-CM | POA: Diagnosis present

## 2021-01-17 DIAGNOSIS — E876 Hypokalemia: Secondary | ICD-10-CM | POA: Diagnosis present

## 2021-01-17 DIAGNOSIS — I10 Essential (primary) hypertension: Secondary | ICD-10-CM | POA: Diagnosis present

## 2021-01-17 DIAGNOSIS — Z833 Family history of diabetes mellitus: Secondary | ICD-10-CM

## 2021-01-17 DIAGNOSIS — G9341 Metabolic encephalopathy: Secondary | ICD-10-CM | POA: Diagnosis not present

## 2021-01-17 DIAGNOSIS — H5461 Unqualified visual loss, right eye, normal vision left eye: Secondary | ICD-10-CM | POA: Diagnosis present

## 2021-01-17 DIAGNOSIS — Z880 Allergy status to penicillin: Secondary | ICD-10-CM

## 2021-01-17 DIAGNOSIS — E1140 Type 2 diabetes mellitus with diabetic neuropathy, unspecified: Secondary | ICD-10-CM | POA: Diagnosis present

## 2021-01-17 DIAGNOSIS — E119 Type 2 diabetes mellitus without complications: Secondary | ICD-10-CM

## 2021-01-17 DIAGNOSIS — Z8249 Family history of ischemic heart disease and other diseases of the circulatory system: Secondary | ICD-10-CM

## 2021-01-17 DIAGNOSIS — Z79899 Other long term (current) drug therapy: Secondary | ICD-10-CM

## 2021-01-17 DIAGNOSIS — Z881 Allergy status to other antibiotic agents status: Secondary | ICD-10-CM

## 2021-01-17 DIAGNOSIS — R1900 Intra-abdominal and pelvic swelling, mass and lump, unspecified site: Secondary | ICD-10-CM

## 2021-01-17 DIAGNOSIS — E1165 Type 2 diabetes mellitus with hyperglycemia: Secondary | ICD-10-CM | POA: Diagnosis present

## 2021-01-17 LAB — COMPREHENSIVE METABOLIC PANEL
ALT: 14 U/L (ref 0–44)
AST: 16 U/L (ref 15–41)
Albumin: 4.2 g/dL (ref 3.5–5.0)
Alkaline Phosphatase: 87 U/L (ref 38–126)
Anion gap: 10 (ref 5–15)
BUN: 15 mg/dL (ref 6–20)
CO2: 20 mmol/L — ABNORMAL LOW (ref 22–32)
Calcium: 9.2 mg/dL (ref 8.9–10.3)
Chloride: 109 mmol/L (ref 98–111)
Creatinine, Ser: 0.68 mg/dL (ref 0.44–1.00)
GFR, Estimated: >60 mL/min (ref >60)
Glucose, Bld: 111 mg/dL — ABNORMAL HIGH (ref 70–99)
Potassium: 3.4 mmol/L — ABNORMAL LOW (ref 3.5–5.1)
Sodium: 139 mmol/L (ref 135–145)
Total Bilirubin: 0.4 mg/dL (ref 0.3–1.2)
Total Protein: 8.1 g/dL (ref 6.5–8.1)

## 2021-01-17 LAB — CBC WITH DIFFERENTIAL/PLATELET
Abs Immature Granulocytes: 0.08 10*3/uL — ABNORMAL HIGH (ref 0.00–0.07)
Basophils Absolute: 0 10*3/uL (ref 0.0–0.1)
Basophils Relative: 0 %
Eosinophils Absolute: 0.2 10*3/uL (ref 0.0–0.5)
Eosinophils Relative: 2 %
HCT: 41 % (ref 36.0–46.0)
Hemoglobin: 12.9 g/dL (ref 12.0–15.0)
Immature Granulocytes: 1 %
Lymphocytes Relative: 18 %
Lymphs Abs: 1.7 10*3/uL (ref 0.7–4.0)
MCH: 30 pg (ref 26.0–34.0)
MCHC: 31.5 g/dL (ref 30.0–36.0)
MCV: 95.3 fL (ref 80.0–100.0)
Monocytes Absolute: 0.6 10*3/uL (ref 0.1–1.0)
Monocytes Relative: 6 %
Neutro Abs: 6.8 10*3/uL (ref 1.7–7.7)
Neutrophils Relative %: 73 %
Platelets: 233 10*3/uL (ref 150–400)
RBC: 4.3 MIL/uL (ref 3.87–5.11)
RDW: 13 % (ref 11.5–15.5)
WBC: 9.3 10*3/uL (ref 4.0–10.5)
nRBC: 0 % (ref 0.0–0.2)

## 2021-01-17 LAB — URINALYSIS, ROUTINE W REFLEX MICROSCOPIC
Bilirubin Urine: NEGATIVE
Glucose, UA: NEGATIVE mg/dL
Ketones, ur: 5 mg/dL — AB
Nitrite: NEGATIVE
Protein, ur: 30 mg/dL — AB
Specific Gravity, Urine: 1.016 (ref 1.005–1.030)
pH: 5 (ref 5.0–8.0)

## 2021-01-17 LAB — AMMONIA: Ammonia: 39 umol/L — ABNORMAL HIGH (ref 9–35)

## 2021-01-17 LAB — POC URINE PREG, ED: Preg Test, Ur: NEGATIVE

## 2021-01-17 LAB — RESP PANEL BY RT-PCR (FLU A&B, COVID) ARPGX2
Influenza A by PCR: NEGATIVE
Influenza B by PCR: NEGATIVE
SARS Coronavirus 2 by RT PCR: NEGATIVE

## 2021-01-17 LAB — APTT: aPTT: 28 seconds (ref 24–36)

## 2021-01-17 LAB — VALPROIC ACID LEVEL: Valproic Acid Lvl: 65 ug/mL (ref 50.0–100.0)

## 2021-01-17 LAB — PROTIME-INR
INR: 1.1 (ref 0.8–1.2)
Prothrombin Time: 14.1 seconds (ref 11.4–15.2)

## 2021-01-17 LAB — GLUCOSE, CAPILLARY: Glucose-Capillary: 91 mg/dL (ref 70–99)

## 2021-01-17 LAB — MAGNESIUM: Magnesium: 1.8 mg/dL (ref 1.7–2.4)

## 2021-01-17 LAB — CBG MONITORING, ED: Glucose-Capillary: 117 mg/dL — ABNORMAL HIGH (ref 70–99)

## 2021-01-17 LAB — LACTIC ACID, PLASMA
Lactic Acid, Venous: 3 mmol/L (ref 0.5–1.9)
Lactic Acid, Venous: 1.7 mmol/L (ref 0.5–1.9)

## 2021-01-17 MED ORDER — ACETAMINOPHEN 325 MG PO TABS
650.0000 mg | ORAL_TABLET | Freq: Four times a day (QID) | ORAL | Status: DC | PRN
  Administered 2021-01-18 – 2021-01-19 (×2): 650 mg via ORAL
  Filled 2021-01-17 (×4): qty 2

## 2021-01-17 MED ORDER — DIVALPROEX SODIUM ER 500 MG PO TB24
500.0000 mg | ORAL_TABLET | Freq: Every day | ORAL | Status: DC
  Administered 2021-01-17 – 2021-01-18 (×2): 500 mg via ORAL
  Filled 2021-01-17 (×2): qty 1

## 2021-01-17 MED ORDER — POLYETHYLENE GLYCOL 3350 17 G PO PACK: 17.0000 g | PACK | Freq: Every day | ORAL | Status: DC | PRN

## 2021-01-17 MED ORDER — ENOXAPARIN SODIUM 40 MG/0.4ML IJ SOSY
40.0000 mg | PREFILLED_SYRINGE | INTRAMUSCULAR | Status: DC
  Administered 2021-01-17 – 2021-01-19 (×3): 40 mg via SUBCUTANEOUS
  Filled 2021-01-17 (×3): qty 0.4

## 2021-01-17 MED ORDER — POTASSIUM CHLORIDE IN NACL 20-0.9 MEQ/L-% IV SOLN: INTRAVENOUS | Status: AC

## 2021-01-17 MED ORDER — ONDANSETRON HCL 4 MG/2ML IJ SOLN: 4.0000 mg | Freq: Four times a day (QID) | INTRAMUSCULAR | Status: DC | PRN

## 2021-01-17 MED ORDER — ACETAMINOPHEN 650 MG RE SUPP: 650.0000 mg | Freq: Four times a day (QID) | RECTAL | Status: DC | PRN

## 2021-01-17 MED ORDER — PANTOPRAZOLE SODIUM 40 MG IV SOLR
40.0000 mg | INTRAVENOUS | Status: DC
  Administered 2021-01-17 – 2021-01-19 (×2): 40 mg via INTRAVENOUS
  Filled 2021-01-17 (×3): qty 40

## 2021-01-17 MED ORDER — LACTATED RINGERS IV SOLN: INTRAVENOUS | Status: DC

## 2021-01-17 MED ORDER — ONDANSETRON HCL 4 MG PO TABS
4.0000 mg | ORAL_TABLET | Freq: Four times a day (QID) | ORAL | Status: DC | PRN
  Administered 2021-01-20: 4 mg via ORAL
  Filled 2021-01-17: qty 1

## 2021-01-17 MED ORDER — SODIUM CHLORIDE 0.9 % IV SOLN
1.0000 g | INTRAVENOUS | Status: DC
  Administered 2021-01-19: 1 g via INTRAVENOUS
  Filled 2021-01-17: qty 10

## 2021-01-17 MED ORDER — SODIUM CHLORIDE 0.9 % IV BOLUS
1000.0000 mL | Freq: Once | INTRAVENOUS | Status: AC
  Administered 2021-01-17: 1000 mL via INTRAVENOUS

## 2021-01-17 MED ORDER — QUETIAPINE FUMARATE ER 50 MG PO TB24
200.0000 mg | ORAL_TABLET | Freq: Every day | ORAL | Status: DC
  Administered 2021-01-17 – 2021-01-18 (×2): 200 mg via ORAL
  Filled 2021-01-17 (×2): qty 4

## 2021-01-17 MED ORDER — INSULIN ASPART 100 UNIT/ML IJ SOLN
0.0000 [IU] | Freq: Every day | INTRAMUSCULAR | Status: DC
  Administered 2021-01-19: 2 [IU] via SUBCUTANEOUS

## 2021-01-17 MED ORDER — INSULIN ASPART 100 UNIT/ML IJ SOLN
0.0000 [IU] | Freq: Three times a day (TID) | INTRAMUSCULAR | Status: DC
  Administered 2021-01-18: 1 [IU] via SUBCUTANEOUS
  Administered 2021-01-18: 2 [IU] via SUBCUTANEOUS
  Administered 2021-01-18 – 2021-01-19 (×2): 1 [IU] via SUBCUTANEOUS
  Administered 2021-01-19: 2 [IU] via SUBCUTANEOUS
  Administered 2021-01-19: 3 [IU] via SUBCUTANEOUS
  Administered 2021-01-20: 5 [IU] via SUBCUTANEOUS
  Administered 2021-01-20: 3 [IU] via SUBCUTANEOUS

## 2021-01-17 MED ORDER — SODIUM CHLORIDE 0.9 % IV SOLN
1.0000 g | Freq: Once | INTRAVENOUS | Status: AC
  Administered 2021-01-17: 1 g via INTRAVENOUS
  Filled 2021-01-17: qty 10

## 2021-01-17 NOTE — ED Notes (Signed)
Me and NT brandi Changed out full lien on pt

## 2021-01-17 NOTE — ED Triage Notes (Signed)
Right side abd pain, goes and comes, skin warm and dry, pt alert, swelling noted in ankles, pt reports vomiting once.

## 2021-01-17 NOTE — ED Provider Notes (Signed)
[facility] EMERGENCY DEPARTMENT Provider Note   CSN: [id number] Arrival date & time: 01/17/21  1448     History Chief Complaint  Patient presents with  . Abdominal Pain    Regina Watson is a 55 y.o. female.  HPI   This patient is a 55 year old female, she is a diabetic who also has a history of schizophrenia neuropathy and hypertension.  According to the medical history the patient also has a history of encephalopathy, she is supposed to be taking lactulose.  The patient is not able to give me much in the way of information, she slurs her words and garbled through a sentence which does not make a lot of sense.  When I talk to Ms. Wanda brim, the person who was taking care of her at her [facility] at phone number [phone], she states that she has been complaining of abdominal discomfort and having multiple episodes of vomiting today.  Yesterday was a normal day.  She is essentially completely blind and unable to walk around very much by herself but she does feel her way around the house, she is usually able to talk, she does not slur her words, she does not become excessively sleepy at times which is not abnormal for her.  She states that her medications are all given to her by the staff at the facility and she has not missed any of her medications.  Blood sugar on arrival is 117  Level 5 caveat applies secondary to altered mental status  Past Medical History:  Diagnosis Date  . Anemia   . Diabetes mellitus without complication ([place])   . Hypertension   . MDD (major depressive disorder)   . Neuropathy 2010  . Schizophrenia [facility])     Patient Active Problem List   Diagnosis Date Noted  . Acute metabolic encephalopathy 02/19/1600  . Altered mental status 10/15/2020  . Leukocytosis 10/15/2020  . AKI (acute kidney injury) ([place]) 10/15/2020  . Hyperglycemia due to diabetes mellitus ([place]) 10/15/2020  . Hyperlipidemia 10/15/2020  . Diabetic neuropathy ([place])  10/15/2020  . UTI (urinary tract infection) 10/15/2019  . Encephalopathy acute 10/14/2019  . Schizophrenia ([place]) 10/14/2019  . Type 2 diabetes mellitus without complication ([place]) 09/32/3557  . Acute encephalopathy 10/14/2019  . Palpitations 10/04/2016  . Essential hypertension 10/04/2016  . Tobacco use 10/04/2016  . Tachycardia 10/04/2016    Past Surgical History:  Procedure Laterality Date  . TOE AMPUTATION       OB History   No obstetric history on file.     Family History  Problem Relation Age of Onset  . Hypertension Mother   . Cancer Mother   . Diabetes Father     Social History   Tobacco Use  . Smoking status: Current Every Day Smoker    Packs/day: 1.00    Years: 20.00    Pack years: 20.00    Types: Cigarettes  . Smokeless tobacco: Never Used  Substance Use Topics  . Alcohol use: No  . Drug use: No    Home Medications Prior to Admission medications   Medication Sig Start Date End Date Taking? Authorizing Provider  acetaminophen (TYLENOL) 325 MG tablet Take 650 mg by mouth every 4 (four) hours as needed for mild pain or moderate pain. Patient not taking: Reported on 10/14/2020    [provider]  acetaZOLAMIDE (DIAMOX) 500 MG capsule Take by mouth. 12/30/20   [provider]  ACETAZOLAMIDE ER PO Take 1 tablet by mouth in  the morning and at bedtime.    [provider]  aspirin EC 81 MG tablet Take 81 mg by mouth every morning.    [provider]  atorvastatin (LIPITOR) 10 MG tablet Take 10 mg by mouth every evening.     [provider]  benztropine (COGENTIN) 0.5 MG tablet Take 0.5 mg by mouth 2 (two) times daily.    [provider]  brimonidine (ALPHAGAN) 0.2 % ophthalmic solution Place 1 drop into the left eye 3 (three) times daily.    [provider]  clobetasol (TEMOVATE) 0.05 % GEL Apply 1 application topically 2 (two) times daily. Patient not taking: Reported on 10/14/2020    [provider]  divalproex (DEPAKOTE ER) 500 MG 24 hr tablet Take 1 tablet (500 mg total) by mouth at bedtime. 10/17/19 11/16/19  Manuella Ghazi, Pratik D, DO  dorzolamide (TRUSOPT) 2 % ophthalmic solution Place 1 drop into the left eye 2 (two) times daily. 09/10/20   [provider]  furosemide (LASIX) 40 MG tablet Take 1 tablet (40 mg total) by mouth daily as needed for fluid or edema. 10/17/20   Lavina Hamman, MD  gabapentin (NEURONTIN) 400 MG capsule Take 1 capsule (400 mg total) by mouth 3 (three) times daily. 10/17/20   Lavina Hamman, MD  insulin aspart (NOVOLOG) 100 UNIT/ML FlexPen Inject 11-16 Units into the skin 3 (three) times daily with meals. SLIDING SCALE: 150-200= 11 units 201-250= 12 units 251-300= 13 units 301-350= 14 units 351-400= 15 units >400=      16 units Call if less than 70 or greater than 300 for 3 tests  In a row    [provider]  insulin glargine (LANTUS) 100 UNIT/ML injection Inject 0.14 mLs (14 Units total) into the skin at bedtime. Patient taking differently: Inject 20 Units into the skin 2 (two) times daily. 10/17/19   Manuella Ghazi, Pratik D, DO  lactulose (CHRONULAC) 10 GM/15ML solution Take 30 mLs (20 g total) by mouth 2 (two) times daily. 10/17/20   Lavina Hamman, MD  levocetirizine (XYZAL) 5 MG tablet Take 5 mg by mouth every evening.    [provider]  metFORMIN (GLUCOPHAGE) 1000 MG tablet Take 1,000 mg by mouth 2 (two) times daily with a meal.    [provider]  Minoxidil 5 % FOAM Apply 1 application topically in the morning and at bedtime. Patient not taking: Reported on 10/14/2020    [provider]  MITIGARE 0.6 MG CAPS Take 1 capsule by mouth daily. 09/29/20   [provider]  nicotine polacrilex (NICORETTE) 4 MG gum Take 4 mg by mouth See admin instructions. Chew one piece 24 times per day for 30 minutes as needed    [provider]  polyethylene glycol (MIRALAX / GLYCOLAX) 17 g packet Take 17 g by mouth  daily.    [provider]  prednisoLONE acetate (PRED FORTE) 1 % ophthalmic suspension Place 1 drop into both eyes 4 (four) times daily.    [provider]  QUEtiapine (SEROQUEL XR) 200 MG 24 hr tablet Take 1 tablet (200 mg total) by mouth every evening. Patient taking differently: Take 400 mg by mouth every evening. 10/17/19 11/16/19  Manuella Ghazi, Pratik D, DO  risperiDONE (RISPERDAL) 1 MG tablet Take 1 tablet (1 mg total) by mouth 2 (two) times daily. 10/17/20 11/16/20  Lavina Hamman, MD  sennosides-docusate sodium (SENOKOT-S) 8.6-50 MG tablet Take 1 tablet by mouth in the morning and at bedtime.  [provider]  timolol (TIMOPTIC) 0.5 % ophthalmic solution Place 1 drop into the left eye 2 (two) times daily. 09/09/20   [provider]  tiotropium (SPIRIVA) 18 MCG inhalation capsule Place 18 mcg into inhaler and inhale daily.    [provider]  triamcinolone ointment (KENALOG) 0.1 % Apply 1 application topically 2 (two) times daily. Patient not taking: Reported on 10/14/2020    [provider]  Vitamin D, Ergocalciferol, (DRISDOL) 1.25 MG (50000 UNIT) CAPS capsule Take 50,000 Units by mouth every 7 (seven) days.    [provider]    Allergies    Bactrim [sulfamethoxazole-trimethoprim], Penicillins, and Latex  Review of Systems   Review of Systems  Unable to perform ROS: Mental status change    Physical Exam Updated Vital Signs BP (!) 162/98   Pulse 98   Temp 97.8 F (36.6 C) (Rectal)   Resp 10   Ht 1.753 m (5\' 9" )   Wt 90.7 kg   SpO2 97%   BMI 29.53 kg/m   Physical Exam Vitals and nursing note reviewed.  Constitutional:      General: She is not in acute distress.    Appearance: She is well-developed.  HENT:     Head: Normocephalic and atraumatic.     Mouth/Throat:     Pharynx: No oropharyngeal exudate.  Eyes:     General: No scleral icterus.       Right eye: No discharge.        Left eye: No discharge.      Conjunctiva/sclera: Conjunctivae normal.     Pupils: Pupils are equal, round, and reactive to light.  Neck:     Thyroid: No thyromegaly.     Vascular: No JVD.  Cardiovascular:     Rate and Rhythm: Normal rate and regular rhythm.     Heart sounds: Normal heart sounds. No murmur heard. No friction rub. No gallop.   Pulmonary:     Effort: Pulmonary effort is normal. No respiratory distress.     Breath sounds: Normal breath sounds. No wheezing or rales.  Abdominal:     General: Bowel sounds are normal. There is no distension.     Palpations: Abdomen is soft. There is no mass.     Tenderness: There is no abdominal tenderness ( mild intermittent ttp on exam  - no guarding - not always tender - veru soft).  Musculoskeletal:        General: No tenderness. Normal range of motion.     Cervical back: Normal range of motion and neck supple.     Right lower leg: Edema present.     Left lower leg: Edema present.     Comments: Edema at ankles bilaterally - prior skin grafts on legs healed well  Lymphadenopathy:     Cervical: No cervical adenopathy.  Skin:    General: Skin is warm and dry.     Findings: No erythema or rash.  Neurological:     Mental Status: She is alert.     Coordination: Coordination normal.     Comments: Somnolent - arousable to voice - can move all 4 extretmities - she has slurred words, generally weak - blind - no facidal droop.  Psychiatric:        Behavior: Behavior normal.     ED Results / Procedures / Treatments   Labs (all labs ordered are listed, but only abnormal results are displayed) Labs Reviewed  AMMONIA - Abnormal; Notable for the following components:  Result Value   Ammonia 39 (*)    All other components within normal limits  LACTIC ACID, PLASMA - Abnormal; Notable for the following components:   Lactic Acid, Venous 3.0 (*)    All other components within normal limits  COMPREHENSIVE METABOLIC PANEL - Abnormal; Notable for the following components:    Potassium 3.4 (*)    CO2 20 (*)    Glucose, Bld 111 (*)    All other components within normal limits  CBC WITH DIFFERENTIAL/PLATELET - Abnormal; Notable for the following components:   Abs Immature Granulocytes 0.08 (*)    All other components within normal limits  URINALYSIS, ROUTINE W REFLEX MICROSCOPIC - Abnormal; Notable for the following components:   Color, Urine AMBER (*)    APPearance CLOUDY (*)    Hgb urine dipstick SMALL (*)    Ketones, ur 5 (*)    Protein, ur 30 (*)    Leukocytes,Ua SMALL (*)    Bacteria, UA RARE (*)    All other components within normal limits  CBG MONITORING, ED - Abnormal; Notable for the following components:   Glucose-Capillary 117 (*)    All other components within normal limits  RESP PANEL BY RT-PCR (FLU A&B, COVID) ARPGX2  CULTURE, BLOOD (SINGLE)  URINE CULTURE  PROTIME-INR  APTT  VALPROIC ACID LEVEL  LACTIC ACID, PLASMA  POC URINE PREG, ED    EKG EKG Interpretation  Date/Time:  Sunday Jan 17 2021 15:12:28 EDT Ventricular Rate:  97 PR Interval:  180 QRS Duration: 79 QT Interval:  352 QTC Calculation: 448 R Axis:   37 Text Interpretation: Sinus rhythm Nonspecific T abnormalities, lateral leads Baseline wander in lead(s) I Confirmed by Noemi Chapel [phone]) on 01/17/2021 4:11:43 PM   Radiology CT ABDOMEN PELVIS WO CONTRAST  Result Date: 01/17/2021 CLINICAL DATA:  Right-sided abdominal pain that comes and goes. Ankle swelling. One episode of vomiting. EXAM: CT ABDOMEN AND PELVIS WITHOUT CONTRAST TECHNIQUE: Multidetector CT imaging of the abdomen and pelvis was performed following the standard protocol without IV contrast. COMPARISON:  Right upper quadrant ultrasound, 10/16/2020. FINDINGS: Lower chest: Linear and reticular lung base opacities consistent with atelectasis. No acute findings. Hepatobiliary: No focal liver abnormality is seen. No gallstones, gallbladder wall thickening, or biliary dilatation. Pancreas: Unremarkable. No  pancreatic ductal dilatation or surrounding inflammatory changes. Spleen: Normal in size without focal abnormality. Adrenals/Urinary Tract: No adrenal masses. Kidneys normal in size, orientation and position. No renal mass, stone or hydronephrosis. Normal ureters. Bladder demonstrates non dependent air. No bladder wall thickening, mass or stone. Stomach/Bowel: Normal stomach. Small bowel and colon are normal in caliber. No wall thickening. No inflammation. Mild generalized increase in the colonic stool burden. Normal appendix visualized. Vascular/Lymphatic: Aortic atherosclerotic calcifications. No aneurysm. No enlarged lymph nodes. Reproductive: Buttock elated mass extends from the right aspect of the uterus consistent with a fibroid, measuring 6.5 x 5.7 x 6.3 cm. Although this appears to be a pedunculated fibroid, and adnexal mass is not completely excluded. Other: Trace pelvic free fluid.  No hernia. Musculoskeletal: No acute or significant osseous findings. IMPRESSION: 1. Non dependent air within the bladder. This suggests recent instrumentation. If there has been no instrumentation, consider bladder infection in the proper clinical setting. 2. No other evidence of an acute abnormality within the abdomen or pelvis. 3. Mild generalized increase in the colonic stool burden. No bowel obstruction or inflammation. 4. Mass along the right aspect of the uterus, most likely a pedunculated fibroid, less likely an adnexal mass. This  measures 6.5 cm in greatest dimension, and could be further assessed with pelvic ultrasound with transabdominal and endovaginal imaging. 5. Aortic atherosclerosis. Electronically Signed   By: Lajean Manes M.D.   On: 01/17/2021 16:23   CT Head Wo Contrast  Result Date: 01/17/2021 CLINICAL DATA:  Altered mental status EXAM: CT HEAD WITHOUT CONTRAST TECHNIQUE: Contiguous axial images were obtained from the base of the skull through the vertex without intravenous contrast. COMPARISON:   10/14/2020 FINDINGS: Brain: No evidence of acute infarction, hemorrhage, hydrocephalus, extra-axial collection or mass lesion/mass effect. Scattered chronic white matter ischemic changes are noted. Vascular: No hyperdense vessel or unexpected calcification. Skull: Normal. Negative for fracture or focal lesion. Sinuses/Orbits: No acute finding. Other: Mild soft tissue swelling is noted in the posterior occipital scalp on the left stable in appearance from the prior exam. IMPRESSION: Chronic white matter ischemic changes without acute abnormality. Electronically Signed   By: Inez Catalina M.D.   On: 01/17/2021 17:03   DG Chest Port 1 View  Result Date: 01/17/2021 CLINICAL DATA:  Right-sided chest pain, possible sepsis EXAM: PORTABLE CHEST 1 VIEW COMPARISON:  10/14/2020 FINDINGS: Cardiac shadow is stable. Aortic calcifications are again seen. Mild vascular prominence is noted stable from the prior study. No focal infiltrate is seen. No bony abnormality is noted. IMPRESSION: Mild vascular congestion.  Stable from the prior exam. Electronically Signed   By: Inez Catalina M.D.   On: 01/17/2021 15:29    Procedures Procedures   Medications Ordered in ED Medications  lactated ringers infusion ( Intravenous New Bag/Given 01/17/21 1549)  cefTRIAXone (ROCEPHIN) 1 g in sodium chloride 0.9 % 100 mL IVPB (1 g Intravenous New Bag/Given 01/17/21 1721)  sodium chloride 0.9 % bolus 1,000 mL (1,000 mLs Intravenous New Bag/Given 01/17/21 1723)    ED Course  I have reviewed the triage vital signs and the nursing notes.  Pertinent labs & imaging results that were available during my care of the patient were reviewed by me and considered in my medical decision making (see chart for details).    MDM Rules/Calculators/A&P                          55 years old, appears mildly encephalopathic to baseline, having some intermittent abdominal pain with vomiting of unclear etiology, check ammonia level, she will need a CT scan  to make sure is not intra-abdominal pathology as she is not able to give me a reliable exam and certainly not a reliable history.  The ammonia level is elevated at 39 but not likely the cause of her overall symptoms.  Lactic acid is 3.0 which is elevated but thankfully no signs of renal dysfunction significant electrolyte abnormalities or leukocytosis.  The patient has a Depakote level which is therapeutic, the urinalysis shows possible urinary tract infection, this is a catheter sample.  The respiratory panel is negative for flu or COVID and the chest x-ray does not reveal any obvious infiltrates.  There does appear to be an abdominal pelvic mass which could be related to the uterus and a fibroid but could be other etiology.  This will be placed in the diagnosis list as well.  I discussed the case with the hospitalist Dr. Denton Brick who has been kind enough to admit this patient to hospital  Final Clinical Impression(s) / ED Diagnoses Final diagnoses:  Acute encephalopathy  Urinary tract infection without hematuria, site unspecified  Elevated lactic acid level  Pelvic mass  Noemi Chapel, MD 01/17/21 [phone]

## 2021-01-17 NOTE — ED Notes (Signed)
BS 117

## 2021-01-17 NOTE — H&P (Addendum)
History and Physical    Regina Watson SEG:[id number] DOB: 11-16-1965 DOA: 01/17/2021  PCP: Clovia Cuff, MD   Patient coming from: [facility]  I have personally briefly reviewed patient's old medical records in [place]  Chief Complaint: Vomiting, Abdominal pain  HPI: Regina Watson is a 55 y.o. female with medical history significant for schizophrenia, hypertension, diabetes mellitus, blind. Patient was brought to the ED with complaints of right-sided abdominal pain, vomiting, reported swelling in ankles.  Initial history by ED provider from patient was limited, as at that time patient was somnolent, and so was slurring her words and speech was garbled.  At the time of my evaluation she is awake and alert, able to give me history, speech occasionally appears jumbled together, but she is able to clarify and tells me this is how she normally talks and her speech is unchanged.   Patient reports vomiting that started yesterday, she is unable to tell me frequency, and patient reports abdominal soreness.  She denies loose stools.  She denies difficulty breathing, no chest pain no cough.  She denies pain with urination.    Hospitalization 09/2020 also for altered mental status, thought secondary to urinary tract infection and polypharmacy.  ED Course: Temperature 97.5, heart rate 98-102, respiratory 10 - 18.  Blood pressure systolic 737T to 062I.  WBC 9.3.  UA shows small leukocytes rare bacteria.  Ammonia level 39.  Lactic acid 3.  Chest x-ray showed mild vascular congestion.  Abdominal CT shows air within the urinary bladder, correlate clinically, mild increased stool burden, uterine mass likely pedunculated fibroid. IV ceftriaxone given..  1 L bolus Given, Ringer's lactate at 150 cc/h started, hospitalist to admit.  Review of Systems: As per HPI all other systems reviewed and negative.  Past Medical History:  Diagnosis Date  . Anemia   . Diabetes mellitus without  complication ([place])   . Hypertension   . MDD (major depressive disorder)   . Neuropathy 2010  . Schizophrenia [facility])     Past Surgical History:  Procedure Laterality Date  . TOE AMPUTATION       reports that she has been smoking cigarettes. She has a 20.00 pack-year smoking history. She has never used smokeless tobacco. She reports that she does not drink alcohol and does not use drugs.  Allergies  Allergen Reactions  . Bactrim [Sulfamethoxazole-Trimethoprim] Hives  . Penicillins     Has patient had a PCN reaction causing immediate rash, facial/tongue/throat swelling, SOB or lightheadedness with hypotension: UNKNOWN Has patient had a PCN reaction causing severe rash involving mucus membranes or skin necrosis: UNKNOWN Has patient had a PCN reaction that required hospitalization: UNKNOWN Has patient had a PCN reaction occurring within the last 10 years: UNKNOWN If all of the above answers are "NO", then may proceed with Cephalosporin use.   . Latex Itching    Family History  Problem Relation Age of Onset  . Hypertension Mother   . Cancer Mother   . Diabetes Father     Prior to Admission medications   Medication Sig Start Date End Date Taking? Authorizing Provider  acetaminophen (TYLENOL) 325 MG tablet Take 650 mg by mouth every 4 (four) hours as needed for mild pain or moderate pain. Patient not taking: Reported on 10/14/2020    [provider]  ACETAZOLAMIDE ER PO Take 1 tablet by mouth in the morning and at bedtime.    [provider]  aspirin EC 81 MG tablet Take 81 mg by mouth  every morning.    [provider]  atorvastatin (LIPITOR) 10 MG tablet Take 10 mg by mouth every evening.     [provider]  benztropine (COGENTIN) 0.5 MG tablet Take 0.5 mg by mouth 2 (two) times daily.    [provider]  brimonidine (ALPHAGAN) 0.2 % ophthalmic solution Place 1 drop into the left eye 3 (three) times daily.    [provider]   clobetasol (TEMOVATE) 0.05 % GEL Apply 1 application topically 2 (two) times daily. Patient not taking: Reported on 10/14/2020    [provider]  divalproex (DEPAKOTE ER) 500 MG 24 hr tablet Take 1 tablet (500 mg total) by mouth at bedtime. 10/17/19 11/16/19  Manuella Ghazi, Pratik D, DO  dorzolamide (TRUSOPT) 2 % ophthalmic solution Place 1 drop into the left eye 2 (two) times daily. 09/10/20   [provider]  furosemide (LASIX) 40 MG tablet Take 1 tablet (40 mg total) by mouth daily as needed for fluid or edema. 10/17/20   Lavina Hamman, MD  gabapentin (NEURONTIN) 400 MG capsule Take 1 capsule (400 mg total) by mouth 3 (three) times daily. 10/17/20   Lavina Hamman, MD  insulin aspart (NOVOLOG) 100 UNIT/ML FlexPen Inject 11-16 Units into the skin 3 (three) times daily with meals. SLIDING SCALE: 150-200= 11 units 201-250= 12 units 251-300= 13 units 301-350= 14 units 351-400= 15 units >400=      16 units Call if less than 70 or greater than 300 for 3 tests  In a row    [provider]  insulin glargine (LANTUS) 100 UNIT/ML injection Inject 0.14 mLs (14 Units total) into the skin at bedtime. Patient taking differently: Inject 20 Units into the skin 2 (two) times daily. 10/17/19   Manuella Ghazi, Pratik D, DO  lactulose (CHRONULAC) 10 GM/15ML solution Take 30 mLs (20 g total) by mouth 2 (two) times daily. 10/17/20   Lavina Hamman, MD  levocetirizine (XYZAL) 5 MG tablet Take 5 mg by mouth every evening.    [provider]  metFORMIN (GLUCOPHAGE) 1000 MG tablet Take 1,000 mg by mouth 2 (two) times daily with a meal.    [provider]  Minoxidil 5 % FOAM Apply 1 application topically in the morning and at bedtime. Patient not taking: Reported on 10/14/2020    [provider]  MITIGARE 0.6 MG CAPS Take 1 capsule by mouth daily. 09/29/20   [provider]  nicotine polacrilex (NICORETTE) 4 MG gum Take 4 mg by mouth See admin instructions. Chew one piece 24  times per day for 30 minutes as needed    [provider]  polyethylene glycol (MIRALAX / GLYCOLAX) 17 g packet Take 17 g by mouth daily.    [provider]  prednisoLONE acetate (PRED FORTE) 1 % ophthalmic suspension Place 1 drop into both eyes 4 (four) times daily.    [provider]  QUEtiapine (SEROQUEL XR) 200 MG 24 hr tablet Take 1 tablet (200 mg total) by mouth every evening. Patient taking differently: Take 400 mg by mouth every evening. 10/17/19 11/16/19  Manuella Ghazi, Pratik D, DO  risperiDONE (RISPERDAL) 1 MG tablet Take 1 tablet (1 mg total) by mouth 2 (two) times daily. 10/17/20 11/16/20  Lavina Hamman, MD  sennosides-docusate sodium (SENOKOT-S) 8.6-50 MG tablet Take 1 tablet by mouth in the morning and at bedtime.    [provider]  timolol (TIMOPTIC) 0.5 % ophthalmic solution Place 1 drop into the left eye 2 (two)  times daily. 09/09/20   [provider]  tiotropium (SPIRIVA) 18 MCG inhalation capsule Place 18 mcg into inhaler and inhale daily.    [provider]  triamcinolone ointment (KENALOG) 0.1 % Apply 1 application topically 2 (two) times daily. Patient not taking: Reported on 10/14/2020    [provider]  Vitamin D, Ergocalciferol, (DRISDOL) 1.25 MG (50000 UNIT) CAPS capsule Take 50,000 Units by mouth every 7 (seven) days.    [provider]    Physical Exam: Vitals:   01/17/21 1600 01/17/21 1630 01/17/21 1700 01/17/21 1730  BP: (!) 146/93 (!) 156/95 (!) 157/100 (!) 162/98  Pulse: 98 (!) 102 100 98  Resp: 13 11 12 10   Temp:      TempSrc:      SpO2: 97% 97% 95% 97%  Weight:      Height:        Constitutional , calm, comfortable Vitals:   01/17/21 1600 01/17/21 1630 01/17/21 1700 01/17/21 1730  BP: (!) 146/93 (!) 156/95 (!) 157/100 (!) 162/98  Pulse: 98 (!) 102 100 98  Resp: 13 11 12 10   Temp:      TempSrc:      SpO2: 97% 97% 95% 97%  Weight:      Height:       Eyes: Blind, right eye abduction,   lids and conjunctivae normal ENMT: Mucous membranes are moist.  Neck: normal, supple, no masses, no thyromegaly Respiratory: clear to auscultation bilaterally, no wheezing, no crackles. Normal respiratory effort. No accessory muscle use.  Cardiovascular: Regular rate and rhythm, no murmurs / rubs / gallops. No extremity edema- with bony musculoskeletal deformity . 2+ pedal pulses.  Abdomen: no tenderness, no masses palpated. No hepatosplenomegaly. Bowel sounds positive.  Musculoskeletal: no clubbing / cyanosis. No joint deformity upper and lower extremities. Good ROM, no contractures. Normal muscle tone.  Skin: no rashes, lesions, ulcers. No induration Neurologic: Blind, speech fluent occasionally jumbled, but patient says this is normal, no facial asymmetry, no tongue fasciculations, 5 of 5 strength bilateral upper and lower extremity Psychiatric: Normal judgment and insight. Alert and oriented x 3. Normal mood.   Labs on Admission: I have personally reviewed following labs and imaging studies  CBC: Recent Labs  Lab 01/17/21 1547  WBC 9.3  NEUTROABS 6.8  HGB 12.9  HCT 41.0  MCV 95.3  PLT 025   Basic Metabolic Panel: Recent Labs  Lab 01/17/21 1547  NA 139  K 3.4*  CL 109  CO2 20*  GLUCOSE 111*  BUN 15  CREATININE 0.68  CALCIUM 9.2   Liver Function Tests: Recent Labs  Lab 01/17/21 1547  AST 16  ALT 14  ALKPHOS 87  BILITOT 0.4  PROT 8.1  ALBUMIN 4.2   No results for input(s): LIPASE, AMYLASE in the last 168 hours. Recent Labs  Lab 01/17/21 1547  AMMONIA 39*   Coagulation Profile: Recent Labs  Lab 01/17/21 1547  INR 1.1   CBG: Recent Labs  Lab 01/17/21 1456  GLUCAP 117*   Urine analysis:    Component Value Date/Time   COLORURINE AMBER (A) 01/17/2021 1510   APPEARANCEUR CLOUDY (A) 01/17/2021 1510   LABSPEC 1.016 01/17/2021 1510   PHURINE 5.0 01/17/2021 1510   GLUCOSEU NEGATIVE 01/17/2021 1510   HGBUR SMALL (A) 01/17/2021 1510   BILIRUBINUR  NEGATIVE 01/17/2021 1510   KETONESUR 5 (A) 01/17/2021 1510   PROTEINUR 30 (A) 01/17/2021 1510   UROBILINOGEN 0.2 12/09/2013 2250   NITRITE NEGATIVE 01/17/2021 1510  LEUKOCYTESUR SMALL (A) 01/17/2021 1510    Radiological Exams on Admission: CT ABDOMEN PELVIS WO CONTRAST  Result Date: 01/17/2021 CLINICAL DATA:  Right-sided abdominal pain that comes and goes. Ankle swelling. One episode of vomiting. EXAM: CT ABDOMEN AND PELVIS WITHOUT CONTRAST TECHNIQUE: Multidetector CT imaging of the abdomen and pelvis was performed following the standard protocol without IV contrast. COMPARISON:  Right upper quadrant ultrasound, 10/16/2020. FINDINGS: Lower chest: Linear and reticular lung base opacities consistent with atelectasis. No acute findings. Hepatobiliary: No focal liver abnormality is seen. No gallstones, gallbladder wall thickening, or biliary dilatation. Pancreas: Unremarkable. No pancreatic ductal dilatation or surrounding inflammatory changes. Spleen: Normal in size without focal abnormality. Adrenals/Urinary Tract: No adrenal masses. Kidneys normal in size, orientation and position. No renal mass, stone or hydronephrosis. Normal ureters. Bladder demonstrates non dependent air. No bladder wall thickening, mass or stone. Stomach/Bowel: Normal stomach. Small bowel and colon are normal in caliber. No wall thickening. No inflammation. Mild generalized increase in the colonic stool burden. Normal appendix visualized. Vascular/Lymphatic: Aortic atherosclerotic calcifications. No aneurysm. No enlarged lymph nodes. Reproductive: Buttock elated mass extends from the right aspect of the uterus consistent with a fibroid, measuring 6.5 x 5.7 x 6.3 cm. Although this appears to be a pedunculated fibroid, and adnexal mass is not completely excluded. Other: Trace pelvic free fluid.  No hernia. Musculoskeletal: No acute or significant osseous findings. IMPRESSION: 1. Non dependent air within the bladder. This suggests  recent instrumentation. If there has been no instrumentation, consider bladder infection in the proper clinical setting. 2. No other evidence of an acute abnormality within the abdomen or pelvis. 3. Mild generalized increase in the colonic stool burden. No bowel obstruction or inflammation. 4. Mass along the right aspect of the uterus, most likely a pedunculated fibroid, less likely an adnexal mass. This measures 6.5 cm in greatest dimension, and could be further assessed with pelvic ultrasound with transabdominal and endovaginal imaging. 5. Aortic atherosclerosis. Electronically Signed   By: Lajean Manes M.D.   On: 01/17/2021 16:23   CT Head Wo Contrast  Result Date: 01/17/2021 CLINICAL DATA:  Altered mental status EXAM: CT HEAD WITHOUT CONTRAST TECHNIQUE: Contiguous axial images were obtained from the base of the skull through the vertex without intravenous contrast. COMPARISON:  10/14/2020 FINDINGS: Brain: No evidence of acute infarction, hemorrhage, hydrocephalus, extra-axial collection or mass lesion/mass effect. Scattered chronic white matter ischemic changes are noted. Vascular: No hyperdense vessel or unexpected calcification. Skull: Normal. Negative for fracture or focal lesion. Sinuses/Orbits: No acute finding. Other: Mild soft tissue swelling is noted in the posterior occipital scalp on the left stable in appearance from the prior exam. IMPRESSION: Chronic white matter ischemic changes without acute abnormality. Electronically Signed   By: Inez Catalina M.D.   On: 01/17/2021 17:03   DG Chest Port 1 View  Result Date: 01/17/2021 CLINICAL DATA:  Right-sided chest pain, possible sepsis EXAM: PORTABLE CHEST 1 VIEW COMPARISON:  10/14/2020 FINDINGS: Cardiac shadow is stable. Aortic calcifications are again seen. Mild vascular prominence is noted stable from the prior study. No focal infiltrate is seen. No bony abnormality is noted. IMPRESSION: Mild vascular congestion.  Stable from the prior exam.  Electronically Signed   By: Inez Catalina M.D.   On: 01/17/2021 15:29    EKG: Independently reviewed.  Sinus rhythm rate 97, QTc 448.  Assessment/Plan Principal Problem:   Acute metabolic encephalopathy Active Problems:   Essential hypertension   Schizophrenia ([place])   Type 2 diabetes mellitus without complication ([place])  Uterine mass    Vomiting with abdominal pain-tachycardic 98 - 102 otherwise rules out for sepsis.  WBC 6.7.  Abdominal CT shows air in the bladder clinical correlation for bladder infection recommended, otherwise no other acute abnormality.  Probable UTI, UA not entirely convincing shows small leukocytes rare bacteria.  Cultures 09/2020, grew Klebsiella resistant to ampicillin and Unasyn. -Continue IV ceftriaxone 1 g daily started in ED -Follow-up urine cultures -Bowel rest with clear liquid diet, advance diet as tolerated -Zofran p.r.n - IV protonix 40mg  daily  Acute metabolic encephalopathy-initial somnolence in the ED has improved.  Head CT without acute abnormality. Depakote level checked within normal limits. AMS likely 2/2 from dehydration. -Hydrate. -Resume Depakote and Seroquel, resume further psychoactive medications pending med reconciliation  Hypertension- elevated to 170s -Hold as needed Lasix - PRN IV labetalol  Uncontrolled diabetes mellitus-glucose 111.  A1c 8.2. - SSi- S -Hold home Lantus 20 u BID -Hold home metformin  DVT prophylaxis: Lovenox Code Status: Full code Family Communication: None at bedside Disposition Plan: ~ 1 - 2 days Consults called: None Admission status: Obs, tele  Bethena Roys MD [facility]  01/17/2021, 8:13 PM

## 2021-01-18 DIAGNOSIS — I10 Essential (primary) hypertension: Secondary | ICD-10-CM | POA: Diagnosis present

## 2021-01-18 DIAGNOSIS — N859 Noninflammatory disorder of uterus, unspecified: Secondary | ICD-10-CM | POA: Diagnosis present

## 2021-01-18 DIAGNOSIS — E876 Hypokalemia: Secondary | ICD-10-CM | POA: Diagnosis present

## 2021-01-18 DIAGNOSIS — F209 Schizophrenia, unspecified: Secondary | ICD-10-CM | POA: Diagnosis not present

## 2021-01-18 DIAGNOSIS — N12 Tubulo-interstitial nephritis, not specified as acute or chronic: Secondary | ICD-10-CM | POA: Diagnosis present

## 2021-01-18 DIAGNOSIS — E119 Type 2 diabetes mellitus without complications: Secondary | ICD-10-CM

## 2021-01-18 DIAGNOSIS — K5900 Constipation, unspecified: Secondary | ICD-10-CM | POA: Diagnosis present

## 2021-01-18 DIAGNOSIS — G9341 Metabolic encephalopathy: Secondary | ICD-10-CM | POA: Diagnosis present

## 2021-01-18 DIAGNOSIS — Z881 Allergy status to other antibiotic agents status: Secondary | ICD-10-CM | POA: Diagnosis not present

## 2021-01-18 DIAGNOSIS — F17210 Nicotine dependence, cigarettes, uncomplicated: Secondary | ICD-10-CM | POA: Diagnosis present

## 2021-01-18 DIAGNOSIS — Z7984 Long term (current) use of oral hypoglycemic drugs: Secondary | ICD-10-CM | POA: Diagnosis not present

## 2021-01-18 DIAGNOSIS — E1165 Type 2 diabetes mellitus with hyperglycemia: Secondary | ICD-10-CM | POA: Diagnosis present

## 2021-01-18 DIAGNOSIS — Z833 Family history of diabetes mellitus: Secondary | ICD-10-CM | POA: Diagnosis not present

## 2021-01-18 DIAGNOSIS — N390 Urinary tract infection, site not specified: Secondary | ICD-10-CM | POA: Diagnosis not present

## 2021-01-18 DIAGNOSIS — N3000 Acute cystitis without hematuria: Secondary | ICD-10-CM

## 2021-01-18 DIAGNOSIS — Z794 Long term (current) use of insulin: Secondary | ICD-10-CM | POA: Diagnosis not present

## 2021-01-18 DIAGNOSIS — H5461 Unqualified visual loss, right eye, normal vision left eye: Secondary | ICD-10-CM | POA: Diagnosis present

## 2021-01-18 DIAGNOSIS — Z20822 Contact with and (suspected) exposure to covid-19: Secondary | ICD-10-CM | POA: Diagnosis present

## 2021-01-18 DIAGNOSIS — R1900 Intra-abdominal and pelvic swelling, mass and lump, unspecified site: Secondary | ICD-10-CM | POA: Diagnosis present

## 2021-01-18 DIAGNOSIS — Z8249 Family history of ischemic heart disease and other diseases of the circulatory system: Secondary | ICD-10-CM | POA: Diagnosis not present

## 2021-01-18 DIAGNOSIS — E860 Dehydration: Secondary | ICD-10-CM | POA: Diagnosis present

## 2021-01-18 DIAGNOSIS — Z79899 Other long term (current) drug therapy: Secondary | ICD-10-CM | POA: Diagnosis not present

## 2021-01-18 DIAGNOSIS — E785 Hyperlipidemia, unspecified: Secondary | ICD-10-CM | POA: Diagnosis present

## 2021-01-18 DIAGNOSIS — F203 Undifferentiated schizophrenia: Secondary | ICD-10-CM | POA: Diagnosis present

## 2021-01-18 DIAGNOSIS — Z1611 Resistance to penicillins: Secondary | ICD-10-CM | POA: Diagnosis present

## 2021-01-18 DIAGNOSIS — B961 Klebsiella pneumoniae [K. pneumoniae] as the cause of diseases classified elsewhere: Secondary | ICD-10-CM | POA: Diagnosis present

## 2021-01-18 DIAGNOSIS — Z7982 Long term (current) use of aspirin: Secondary | ICD-10-CM | POA: Diagnosis not present

## 2021-01-18 DIAGNOSIS — E1140 Type 2 diabetes mellitus with diabetic neuropathy, unspecified: Secondary | ICD-10-CM | POA: Diagnosis present

## 2021-01-18 DIAGNOSIS — Z880 Allergy status to penicillin: Secondary | ICD-10-CM | POA: Diagnosis not present

## 2021-01-18 LAB — CBC
HCT: 36.8 % (ref 36.0–46.0)
Hemoglobin: 11.9 g/dL — ABNORMAL LOW (ref 12.0–15.0)
MCH: 30.1 pg (ref 26.0–34.0)
MCHC: 32.3 g/dL (ref 30.0–36.0)
MCV: 92.9 fL (ref 80.0–100.0)
Platelets: 222 10*3/uL (ref 150–400)
RBC: 3.96 MIL/uL (ref 3.87–5.11)
RDW: 12.7 % (ref 11.5–15.5)
WBC: 5.8 10*3/uL (ref 4.0–10.5)
nRBC: 0 % (ref 0.0–0.2)

## 2021-01-18 LAB — BASIC METABOLIC PANEL
Anion gap: 7 (ref 5–15)
BUN: 9 mg/dL (ref 6–20)
CO2: 22 mmol/L (ref 22–32)
Calcium: 9.1 mg/dL (ref 8.9–10.3)
Chloride: 111 mmol/L (ref 98–111)
Creatinine, Ser: 0.46 mg/dL (ref 0.44–1.00)
GFR, Estimated: >60 mL/min (ref >60)
Glucose, Bld: 113 mg/dL — ABNORMAL HIGH (ref 70–99)
Potassium: 3.2 mmol/L — ABNORMAL LOW (ref 3.5–5.1)
Sodium: 140 mmol/L (ref 135–145)

## 2021-01-18 LAB — GLUCOSE, CAPILLARY
Glucose-Capillary: 127 mg/dL — ABNORMAL HIGH (ref 70–99)
Glucose-Capillary: 141 mg/dL — ABNORMAL HIGH (ref 70–99)
Glucose-Capillary: 156 mg/dL — ABNORMAL HIGH (ref 70–99)
Glucose-Capillary: 176 mg/dL — ABNORMAL HIGH (ref 70–99)

## 2021-01-18 MED ORDER — AMLODIPINE BESYLATE 5 MG PO TABS
5.0000 mg | ORAL_TABLET | Freq: Every day | ORAL | Status: DC
  Administered 2021-01-18 – 2021-01-20 (×3): 5 mg via ORAL
  Filled 2021-01-18 (×4): qty 1

## 2021-01-18 MED ORDER — RISPERIDONE 1 MG PO TABS
1.0000 mg | ORAL_TABLET | Freq: Two times a day (BID) | ORAL | Status: DC
  Administered 2021-01-18: 1 mg via ORAL
  Filled 2021-01-18 (×3): qty 1

## 2021-01-18 MED ORDER — BRIMONIDINE TARTRATE 0.2 % OP SOLN
1.0000 [drp] | Freq: Three times a day (TID) | OPHTHALMIC | Status: DC
  Administered 2021-01-18 – 2021-01-20 (×7): 1 [drp] via OPHTHALMIC
  Filled 2021-01-18 (×2): qty 5

## 2021-01-18 MED ORDER — ATORVASTATIN CALCIUM 10 MG PO TABS
10.0000 mg | ORAL_TABLET | Freq: Every evening | ORAL | Status: DC
  Administered 2021-01-18 – 2021-01-19 (×2): 10 mg via ORAL
  Filled 2021-01-18 (×3): qty 1

## 2021-01-18 MED ORDER — COLCHICINE 0.6 MG PO TABS
0.6000 mg | ORAL_TABLET | Freq: Every day | ORAL | Status: DC
  Administered 2021-01-18 – 2021-01-20 (×3): 0.6 mg via ORAL
  Filled 2021-01-18 (×4): qty 1

## 2021-01-18 MED ORDER — ASPIRIN EC 81 MG PO TBEC
81.0000 mg | DELAYED_RELEASE_TABLET | Freq: Every morning | ORAL | Status: DC
  Administered 2021-01-19 – 2021-01-20 (×2): 81 mg via ORAL
  Filled 2021-01-18 (×2): qty 1

## 2021-01-18 MED ORDER — DORZOLAMIDE HCL 2 % OP SOLN
1.0000 [drp] | Freq: Two times a day (BID) | OPHTHALMIC | Status: DC
  Administered 2021-01-18 – 2021-01-20 (×4): 1 [drp] via OPHTHALMIC
  Filled 2021-01-18 (×2): qty 10

## 2021-01-18 MED ORDER — TIMOLOL MALEATE 0.5 % OP SOLN
1.0000 [drp] | Freq: Two times a day (BID) | OPHTHALMIC | Status: DC
  Administered 2021-01-18 – 2021-01-20 (×4): 1 [drp] via OPHTHALMIC
  Filled 2021-01-18 (×2): qty 5

## 2021-01-18 MED ORDER — MAGNESIUM SULFATE 2 GM/50ML IV SOLN
2.0000 g | Freq: Once | INTRAVENOUS | Status: DC
  Filled 2021-01-18: qty 50

## 2021-01-18 MED ORDER — POTASSIUM CHLORIDE CRYS ER 20 MEQ PO TBCR
40.0000 meq | EXTENDED_RELEASE_TABLET | Freq: Once | ORAL | Status: AC
  Administered 2021-01-18: 40 meq via ORAL
  Filled 2021-01-18: qty 2

## 2021-01-18 MED ORDER — INSULIN GLARGINE 100 UNIT/ML ~~LOC~~ SOLN
20.0000 [IU] | Freq: Two times a day (BID) | SUBCUTANEOUS | Status: DC
  Administered 2021-01-18 – 2021-01-20 (×4): 20 [IU] via SUBCUTANEOUS
  Filled 2021-01-18 (×7): qty 0.2

## 2021-01-18 MED ORDER — UMECLIDINIUM BROMIDE 62.5 MCG/INH IN AEPB
1.0000 | INHALATION_SPRAY | Freq: Every day | RESPIRATORY_TRACT | Status: DC
  Filled 2021-01-18: qty 7

## 2021-01-18 MED ORDER — GABAPENTIN 400 MG PO CAPS
400.0000 mg | ORAL_CAPSULE | Freq: Three times a day (TID) | ORAL | Status: DC
  Administered 2021-01-18 – 2021-01-19 (×3): 400 mg via ORAL
  Filled 2021-01-18 (×3): qty 1

## 2021-01-18 MED ORDER — LACTULOSE 10 GM/15ML PO SOLN
20.0000 g | Freq: Two times a day (BID) | ORAL | Status: DC
  Administered 2021-01-18 – 2021-01-20 (×4): 20 g via ORAL
  Filled 2021-01-18 (×4): qty 30

## 2021-01-18 MED ORDER — POTASSIUM CHLORIDE IN NACL 20-0.9 MEQ/L-% IV SOLN: INTRAVENOUS | Status: DC

## 2021-01-18 MED ORDER — BENZTROPINE MESYLATE 1 MG PO TABS
0.5000 mg | ORAL_TABLET | Freq: Two times a day (BID) | ORAL | Status: DC
  Administered 2021-01-18 – 2021-01-20 (×4): 0.5 mg via ORAL
  Filled 2021-01-18 (×4): qty 1

## 2021-01-18 MED ORDER — HYDRALAZINE HCL 20 MG/ML IJ SOLN: 10.0000 mg | INTRAMUSCULAR | Status: DC | PRN

## 2021-01-18 NOTE — Progress Notes (Signed)
Called sister (legal guardian) to obtain consent for PICC placement. No answer x2.

## 2021-01-18 NOTE — Progress Notes (Signed)
Patient removed iv. Six attempts made by staff to gain iv access including ultrasound. Unsuccessful. Notified Hospitalist

## 2021-01-18 NOTE — Progress Notes (Signed)
Spoke with Velva Harman RN re PICC order.  Velva Harman to check if SWOT RN available and to obtain consent from legal guardian and notify VAS Team via secure chat.

## 2021-01-18 NOTE — Progress Notes (Signed)
Informed MD Wynetta Emery of difficulty obtaining PICC consent. Awaiting response.

## 2021-01-18 NOTE — Progress Notes (Signed)
Spoke with primary RN re: PICC consent that unable to reach legal guardian to sign consent. Primary RN will update PICC team when necessary.

## 2021-01-18 NOTE — Progress Notes (Signed)
Spoke with DSS emergency contact Bill for obtaining PICC consent for patient.  Patient states he will return call to attending nurse.

## 2021-01-18 NOTE — Progress Notes (Signed)
Attempted to call legal guardian 3x again. No answer.  No SWAT nurse on site today to get In contact with per [facility].

## 2021-01-18 NOTE — Progress Notes (Addendum)
PROGRESS NOTE   Regina Watson  WCH:[id number] DOB: 04/27/1966 DOA: 01/17/2021 PCP: Clovia Cuff, MD   Chief Complaint  Patient presents with  . Abdominal Pain   Level of care: Med-Surg  Brief Admission History:  55 y.o. female with medical history significant for schizophrenia, hypertension, diabetes mellitus, blind. Patient was brought to the ED with complaints of right-sided abdominal pain, vomiting, reported swelling in ankles.  Initial history by ED provider from patient was limited, as at that time patient was somnolent, and so was slurring her words and speech was garbled.  At the time of my evaluation she is awake and alert, able to give me history, speech occasionally appears jumbled together, but she is able to clarify and tells me this is how she normally talks and her speech is unchanged.  Patient reports vomiting that started yesterday, she is unable to tell me frequency, and patient reports abdominal soreness.  She denies loose stools.  She denies difficulty breathing, no chest pain no cough.  She denies pain with urination.    Assessment & Plan:   Principal Problem:   Acute metabolic encephalopathy Active Problems:   Essential hypertension   Schizophrenia (HCC)   Type 2 diabetes mellitus without complication (HCC)   UTI (urinary tract infection)   Uterine mass   Nonspecific abdominal pain -suspect secondary to urinary tract infection with cystitis, treating with IV fluids and IV antibiotics.  Patient seems to be tolerating clear liquid diet at this time and advance as tolerated.  Continue IV Protonix and Zofran as needed for rebound symptoms.  Acute metabolic encephalopathy-clinically seems to be improving resume all home medications and follow.  Schizophrenia - still awaiting home meds to be reconciled, asked pharmacy to resume home psychiatric medication.   UTI with cystitis - continue IV ceftriaxone pending urine cultures  Essential hypertension - add  amlodipine daily, hydralazine PRN  Poor IV access - staffing unable to get IV after multiple attempts, ordered for PICC line.    DVT prophylaxis: enoxaparin  Code Status: full  Family Communication: patient  Disposition: home  Status is: Inpatient  Remains inpatient appropriate because:IV treatments appropriate due to intensity of illness or inability to take PO and Inpatient level of care appropriate due to severity of illness   Dispo: The patient is from: Home              Anticipated d/c is to: Home              Patient currently is not medically stable to d/c.   Difficult to place patient No       Consultants:     Procedures:     Antimicrobials:     Subjective: Pt reports she is tolerating the clear liquid diet.   Objective: Vitals:   01/17/21 2040 01/18/21 0100 01/18/21 0445 01/18/21 1346  BP: (!) 171/89 (!) 165/81 (!) 163/91 (!) 159/92  Pulse: (!) 102 99 92 89  Resp: 20 19 18 18   Temp: 98.2 F (36.8 C) 98.1 F (36.7 C) 98.1 F (36.7 C) 98.5 F (36.9 C)  TempSrc:  Oral    SpO2: 100% 99% 100% 100%  Weight:      Height:        Intake/Output Summary (Last 24 hours) at 01/18/2021 1652 Last data filed at 01/18/2021 1300 Gross per 24 hour  Intake 1437.3 ml  Output --  Net 1437.3 ml   Filed Weights   01/17/21 1454  Weight: 90.7 kg    Examination:  General exam: Appears calm and comfortable  Respiratory system: Clear to auscultation. Respiratory effort normal. Cardiovascular system: normal S1 & S2 heard. No JVD, murmurs, rubs, gallops or clicks. No pedal edema. Gastrointestinal system: Abdomen is nondistended, soft and nontender. No organomegaly or masses felt. Normal bowel sounds heard. Central nervous system: Alert and oriented. No focal neurological deficits. Extremities: Symmetric 5 x 5 power. Skin: No rashes, lesions or ulcers Psychiatry: Judgement and insight appear normal. Mood & affect appropriate.   Data Reviewed: I have personally  reviewed following labs and imaging studies  CBC: Recent Labs  Lab 01/17/21 1547 01/18/21 0630  WBC 9.3 5.8  NEUTROABS 6.8  --   HGB 12.9 11.9*  HCT 41.0 36.8  MCV 95.3 92.9  PLT 233 428    Basic Metabolic Panel: Recent Labs  Lab 01/17/21 1547 01/18/21 0630  NA 139 140  K 3.4* 3.2*  CL 109 111  CO2 20* 22  GLUCOSE 111* 113*  BUN 15 9  CREATININE 0.68 0.46  CALCIUM 9.2 9.1  MG 1.8  --     GFR: Estimated Creatinine Clearance: 97.6 mL/min (by C-G formula based on SCr of 0.46 mg/dL).  Liver Function Tests: Recent Labs  Lab 01/17/21 1547  AST 16  ALT 14  ALKPHOS 87  BILITOT 0.4  PROT 8.1  ALBUMIN 4.2    CBG: Recent Labs  Lab 01/17/21 1456 01/17/21 2040 01/18/21 0754 01/18/21 1116 01/18/21 1630  GLUCAP 117* 91 127* 141* 156*    Recent Results (from the past 240 hour(s))  Resp Panel by RT-PCR (Flu A&B, Covid) Nasopharyngeal Swab     Status: None   Collection Time: 01/17/21  3:19 PM   Specimen: Nasopharyngeal Swab; Nasopharyngeal(NP) swabs in vial transport medium  Result Value Ref Range Status   SARS Coronavirus 2 by RT PCR NEGATIVE NEGATIVE Final    Comment: (NOTE) SARS-CoV-2 target nucleic acids are NOT DETECTED.  The SARS-CoV-2 RNA is generally detectable in upper respiratory specimens during the acute phase of infection. The lowest concentration of SARS-CoV-2 viral copies this assay can detect is 138 copies/mL. A negative result does not preclude SARS-Cov-2 infection and should not be used as the sole basis for treatment or other patient management decisions. A negative result may occur with  improper specimen collection/handling, submission of specimen other than nasopharyngeal swab, presence of viral mutation(s) within the areas targeted by this assay, and inadequate number of viral copies(<138 copies/mL). A negative result must be combined with clinical observations, patient history, and epidemiological information. The expected result is  Negative.  Fact Sheet for Patients:  EntrepreneurPulse.com.au  Fact Sheet for Healthcare Providers:  IncredibleEmployment.be  This test is no t yet approved or cleared by the [place] FDA and  has been authorized for detection and/or diagnosis of SARS-CoV-2 by FDA under an Emergency Use Authorization (EUA). This EUA will remain  in effect (meaning this test can be used) for the duration of the COVID-19 declaration under Section 564(b)(1) of the Act, 21 U.S.C.section 360bbb-3(b)(1), unless the authorization is terminated  or revoked sooner.       Influenza A by PCR NEGATIVE NEGATIVE Final   Influenza B by PCR NEGATIVE NEGATIVE Final    Comment: (NOTE) The Xpert Xpress SARS-CoV-2/FLU/RSV plus assay is intended as an aid in the diagnosis of influenza from Nasopharyngeal swab specimens and should not be used as a sole basis for treatment. Nasal washings and aspirates are unacceptable for Xpert Xpress SARS-CoV-2/FLU/RSV testing.  Fact Sheet for Patients: EntrepreneurPulse.com.au  Fact Sheet for Healthcare Providers: IncredibleEmployment.be  This test is not yet approved or cleared by the [place] FDA and has been authorized for detection and/or diagnosis of SARS-CoV-2 by FDA under an Emergency Use Authorization (EUA). This EUA will remain in effect (meaning this test can be used) for the duration of the COVID-19 declaration under Section 564(b)(1) of the Act, 21 U.S.C. section 360bbb-3(b)(1), unless the authorization is terminated or revoked.  Performed at [facility], [address] [place], [place] [address]   Blood culture (routine single)     Status: None (Preliminary result)   Collection Time: 01/17/21  3:51 PM   Specimen: BLOOD  Result Value Ref Range Status   Specimen Description BLOOD BLOOD LEFT ARM  Final   Special Requests   Final    BOTTLES DRAWN AEROBIC AND ANAEROBIC Blood Culture  adequate volume   Culture   Final    NO GROWTH < 24 HOURS Performed at [facility], [address] [place], [place] [address]    Report Status PENDING  Incomplete     Radiology Studies: CT ABDOMEN PELVIS WO CONTRAST  Result Date: 01/17/2021 CLINICAL DATA:  Right-sided abdominal pain that comes and goes. Ankle swelling. One episode of vomiting. EXAM: CT ABDOMEN AND PELVIS WITHOUT CONTRAST TECHNIQUE: Multidetector CT imaging of the abdomen and pelvis was performed following the standard protocol without IV contrast. COMPARISON:  Right upper quadrant ultrasound, 10/16/2020. FINDINGS: Lower chest: Linear and reticular lung base opacities consistent with atelectasis. No acute findings. Hepatobiliary: No focal liver abnormality is seen. No gallstones, gallbladder wall thickening, or biliary dilatation. Pancreas: Unremarkable. No pancreatic ductal dilatation or surrounding inflammatory changes. Spleen: Normal in size without focal abnormality. Adrenals/Urinary Tract: No adrenal masses. Kidneys normal in size, orientation and position. No renal mass, stone or hydronephrosis. Normal ureters. Bladder demonstrates non dependent air. No bladder wall thickening, mass or stone. Stomach/Bowel: Normal stomach. Small bowel and colon are normal in caliber. No wall thickening. No inflammation. Mild generalized increase in the colonic stool burden. Normal appendix visualized. Vascular/Lymphatic: Aortic atherosclerotic calcifications. No aneurysm. No enlarged lymph nodes. Reproductive: Buttock elated mass extends from the right aspect of the uterus consistent with a fibroid, measuring 6.5 x 5.7 x 6.3 cm. Although this appears to be a pedunculated fibroid, and adnexal mass is not completely excluded. Other: Trace pelvic free fluid.  No hernia. Musculoskeletal: No acute or significant osseous findings. IMPRESSION: 1. Non dependent air within the bladder. This suggests recent instrumentation. If there has been no  instrumentation, consider bladder infection in the proper clinical setting. 2. No other evidence of an acute abnormality within the abdomen or pelvis. 3. Mild generalized increase in the colonic stool burden. No bowel obstruction or inflammation. 4. Mass along the right aspect of the uterus, most likely a pedunculated fibroid, less likely an adnexal mass. This measures 6.5 cm in greatest dimension, and could be further assessed with pelvic ultrasound with transabdominal and endovaginal imaging. 5. Aortic atherosclerosis. Electronically Signed   By: Lajean Manes M.D.   On: 01/17/2021 16:23   CT Head Wo Contrast  Result Date: 01/17/2021 CLINICAL DATA:  Altered mental status EXAM: CT HEAD WITHOUT CONTRAST TECHNIQUE: Contiguous axial images were obtained from the base of the skull through the vertex without intravenous contrast. COMPARISON:  10/14/2020 FINDINGS: Brain: No evidence of acute infarction, hemorrhage, hydrocephalus, extra-axial collection or mass lesion/mass effect. Scattered chronic white matter ischemic changes are noted. Vascular: No hyperdense vessel or unexpected calcification. Skull: Normal. Negative for fracture  or focal lesion. Sinuses/Orbits: No acute finding. Other: Mild soft tissue swelling is noted in the posterior occipital scalp on the left stable in appearance from the prior exam. IMPRESSION: Chronic white matter ischemic changes without acute abnormality. Electronically Signed   By: Inez Catalina M.D.   On: 01/17/2021 17:03   DG Chest Port 1 View  Result Date: 01/17/2021 CLINICAL DATA:  Right-sided chest pain, possible sepsis EXAM: PORTABLE CHEST 1 VIEW COMPARISON:  10/14/2020 FINDINGS: Cardiac shadow is stable. Aortic calcifications are again seen. Mild vascular prominence is noted stable from the prior study. No focal infiltrate is seen. No bony abnormality is noted. IMPRESSION: Mild vascular congestion.  Stable from the prior exam. Electronically Signed   By: Inez Catalina M.D.    On: 01/17/2021 15:29   [place] EKG SITE RITE  Result Date: 01/18/2021 If Site Rite image not attached, placement could not be confirmed due to current cardiac rhythm.   Scheduled Meds: . divalproex  500 mg Oral QHS  . enoxaparin (LOVENOX) injection  40 mg Subcutaneous Q24H  . insulin aspart  0-5 Units Subcutaneous QHS  . insulin aspart  0-9 Units Subcutaneous TID WC  . pantoprazole (PROTONIX) IV  40 mg Intravenous Q24H  . QUEtiapine  200 mg Oral QHS   Continuous Infusions: . 0.9 % NaCl with KCl 20 mEq / L Stopped (01/18/21 0300)  . cefTRIAXone (ROCEPHIN)  IV    . magnesium sulfate bolus IVPB Stopped (01/18/21 1412)     LOS: 0 days   Time spent: 37 mins   Kitrina Maurin Wynetta Emery, MD How to contact the [facility] Attending or Consulting provider [place] or covering provider during after hours [place], for this patient?  1. Check the care team in [facility] and look for a) attending/consulting [facility] provider listed and b) the [facility] team listed 2. Log into [email or web address] and use [place]'s universal password to access. If you do not have the password, please contact the hospital operator. 3. Locate the [facility] provider you are looking for under [facility] and page to a number that you can be directly reached. 4. If you still have difficulty reaching the provider, please page the [facility] (Director on Call) for the Hospitalists listed on amion for assistance.  01/18/2021, 4:52 PM

## 2021-01-18 NOTE — Clinical Social Work Note (Signed)
CSW notified of RN's difficulty with contacting legal guardian. CSW provided correct legal guardian's number [phone] and provided instructions on contacting the on call supervisor for consent. Nurse able to reach supervisor. TOC to follow.

## 2021-01-19 ENCOUNTER — Observation Stay: Payer: Self-pay

## 2021-01-19 DIAGNOSIS — I10 Essential (primary) hypertension: Secondary | ICD-10-CM | POA: Diagnosis not present

## 2021-01-19 DIAGNOSIS — F209 Schizophrenia, unspecified: Secondary | ICD-10-CM | POA: Diagnosis not present

## 2021-01-19 DIAGNOSIS — G9341 Metabolic encephalopathy: Secondary | ICD-10-CM | POA: Diagnosis not present

## 2021-01-19 DIAGNOSIS — E119 Type 2 diabetes mellitus without complications: Secondary | ICD-10-CM | POA: Diagnosis not present

## 2021-01-19 LAB — GLUCOSE, CAPILLARY
Glucose-Capillary: 132 mg/dL — ABNORMAL HIGH (ref 70–99)
Glucose-Capillary: 193 mg/dL — ABNORMAL HIGH (ref 70–99)
Glucose-Capillary: 223 mg/dL — ABNORMAL HIGH (ref 70–99)
Glucose-Capillary: 208 mg/dL — ABNORMAL HIGH (ref 70–99)

## 2021-01-19 LAB — CBC WITH DIFFERENTIAL/PLATELET
Abs Immature Granulocytes: 0.02 10*3/uL (ref 0.00–0.07)
Basophils Absolute: 0 10*3/uL (ref 0.0–0.1)
Basophils Relative: 1 %
Eosinophils Absolute: 0.2 10*3/uL (ref 0.0–0.5)
Eosinophils Relative: 4 %
HCT: 39.1 % (ref 36.0–46.0)
Hemoglobin: 12.4 g/dL (ref 12.0–15.0)
Immature Granulocytes: 0 %
Lymphocytes Relative: 47 %
Lymphs Abs: 2.8 10*3/uL (ref 0.7–4.0)
MCH: 29.6 pg (ref 26.0–34.0)
MCHC: 31.7 g/dL (ref 30.0–36.0)
MCV: 93.3 fL (ref 80.0–100.0)
Monocytes Absolute: 0.6 10*3/uL (ref 0.1–1.0)
Monocytes Relative: 10 %
Neutro Abs: 2.2 10*3/uL (ref 1.7–7.7)
Neutrophils Relative %: 38 %
Platelets: 208 10*3/uL (ref 150–400)
RBC: 4.19 MIL/uL (ref 3.87–5.11)
RDW: 12.4 % (ref 11.5–15.5)
WBC: 5.8 10*3/uL (ref 4.0–10.5)
nRBC: 0 % (ref 0.0–0.2)

## 2021-01-19 LAB — COMPREHENSIVE METABOLIC PANEL
ALT: 12 U/L (ref 0–44)
AST: 12 U/L — ABNORMAL LOW (ref 15–41)
Albumin: 3.7 g/dL (ref 3.5–5.0)
Alkaline Phosphatase: 76 U/L (ref 38–126)
Anion gap: 7 (ref 5–15)
BUN: 10 mg/dL (ref 6–20)
CO2: 22 mmol/L (ref 22–32)
Calcium: 9.2 mg/dL (ref 8.9–10.3)
Chloride: 111 mmol/L (ref 98–111)
Creatinine, Ser: 0.46 mg/dL (ref 0.44–1.00)
GFR, Estimated: >60 mL/min (ref >60)
Glucose, Bld: 146 mg/dL — ABNORMAL HIGH (ref 70–99)
Potassium: 3.6 mmol/L (ref 3.5–5.1)
Sodium: 140 mmol/L (ref 135–145)
Total Bilirubin: 0.5 mg/dL (ref 0.3–1.2)
Total Protein: 7.1 g/dL (ref 6.5–8.1)

## 2021-01-19 LAB — MAGNESIUM: Magnesium: 1.8 mg/dL (ref 1.7–2.4)

## 2021-01-19 MED ORDER — SODIUM CHLORIDE 0.9% FLUSH: 10.0000 mL | INTRAVENOUS | Status: DC | PRN

## 2021-01-19 MED ORDER — RISPERIDONE 1 MG PO TBDP
3.0000 mg | ORAL_TABLET | Freq: Two times a day (BID) | ORAL | Status: DC
  Administered 2021-01-19 – 2021-01-20 (×3): 3 mg via ORAL
  Filled 2021-01-19 (×7): qty 3

## 2021-01-19 MED ORDER — SODIUM CHLORIDE 0.9% FLUSH
10.0000 mL | Freq: Two times a day (BID) | INTRAVENOUS | Status: DC
Start: 2021-01-19 — End: 2021-01-20
  Administered 2021-01-19 – 2021-01-20 (×2): 10 mL

## 2021-01-19 MED ORDER — QUETIAPINE FUMARATE ER 200 MG PO TB24
800.0000 mg | ORAL_TABLET | Freq: Every evening | ORAL | Status: DC
  Administered 2021-01-19: 800 mg via ORAL
  Filled 2021-01-19: qty 4
  Filled 2021-01-19: qty 2
  Filled 2021-01-19: qty 4

## 2021-01-19 MED ORDER — HYDROXYZINE HCL 25 MG PO TABS
25.0000 mg | ORAL_TABLET | Freq: Three times a day (TID) | ORAL | Status: DC
  Administered 2021-01-19 – 2021-01-20 (×5): 25 mg via ORAL
  Filled 2021-01-19 (×5): qty 1

## 2021-01-19 MED ORDER — RISPERIDONE 2 MG PO TBDP
3.0000 mg | ORAL_TABLET | Freq: Two times a day (BID) | ORAL | Status: DC
  Filled 2021-01-19 (×3): qty 1

## 2021-01-19 MED ORDER — DIVALPROEX SODIUM ER 500 MG PO TB24
1000.0000 mg | ORAL_TABLET | Freq: Every day | ORAL | Status: DC
  Administered 2021-01-19: 1000 mg via ORAL
  Filled 2021-01-19: qty 2

## 2021-01-19 MED ORDER — GABAPENTIN 400 MG PO CAPS
400.0000 mg | ORAL_CAPSULE | Freq: Four times a day (QID) | ORAL | Status: DC
  Administered 2021-01-19 – 2021-01-20 (×5): 400 mg via ORAL
  Filled 2021-01-19 (×5): qty 1

## 2021-01-19 MED ORDER — CHLORHEXIDINE GLUCONATE CLOTH 2 % EX PADS
6.0000 | MEDICATED_PAD | Freq: Every day | CUTANEOUS | Status: DC
  Administered 2021-01-19 – 2021-01-20 (×2): 6 via TOPICAL

## 2021-01-19 MED ORDER — ACETAZOLAMIDE ER 500 MG PO CP12
500.0000 mg | ORAL_CAPSULE | Freq: Two times a day (BID) | ORAL | Status: DC
  Administered 2021-01-19 – 2021-01-20 (×3): 500 mg via ORAL
  Filled 2021-01-19 (×8): qty 1

## 2021-01-19 NOTE — Progress Notes (Signed)
Peripherally Inserted Central Catheter Placement  The IV Nurse has discussed with the patient and/or persons authorized to consent for the patient, the purpose of this procedure and the potential benefits and risks involved with this procedure.  The benefits include less needle sticks, lab draws from the catheter, and the patient may be discharged home with the catheter. Risks include, but not limited to, infection, bleeding, blood clot (thrombus formation), and puncture of an artery; nerve damage and irregular heartbeat and possibility to perform a PICC exchange if needed/ordered by physician.  Alternatives to this procedure were also discussed.  Bard Power PICC patient education guide, fact sheet on infection prevention and patient information card has been provided to patient /or left at bedside.    PICC Placement Documentation  PICC Double Lumen 41/66/06 PICC Right Basilic 37 cm 1 cm (Active)  Indication for Insertion or Continuance of Line Poor Vasculature-patient has had multiple peripheral attempts or PIVs lasting less than 24 hours 01/19/21 1500  Exposed Catheter (cm) 1 cm 01/19/21 1500  Site Assessment Clean;Dry;Intact 01/19/21 1500  Lumen #1 Status Flushed;Blood return noted 01/19/21 1500  Lumen #2 Status Flushed;Blood return noted 01/19/21 1500  Dressing Type Transparent 01/19/21 1500  Dressing Status Clean;Dry;Intact 01/19/21 1500  Antimicrobial disc in place? Yes 01/19/21 1500  Dressing Change Due 01/26/21 01/19/21 1500    Legal Guardian with telephone consent   Jule Economy Horton 01/19/2021, 3:46 PM

## 2021-01-19 NOTE — Progress Notes (Signed)
PROGRESS NOTE   Regina Watson  LPF:[id number] DOB: April 20, 1966 DOA: 01/17/2021 PCP: Clovia Cuff, MD   Chief Complaint  Patient presents with  . Abdominal Pain   Level of care: Med-Surg  Brief Admission History:  55 y.o. female with medical history significant for schizophrenia, hypertension, diabetes mellitus, blind. Patient was brought to the ED with complaints of right-sided abdominal pain, vomiting, reported swelling in ankles.  Initial history by ED provider from patient was limited, as at that time patient was somnolent, and so was slurring her words and speech was garbled.  At the time of my evaluation she is awake and alert, able to give me history, speech occasionally appears jumbled together, but she is able to clarify and tells me this is how she normally talks and her speech is unchanged.  Patient reports vomiting that started yesterday, she is unable to tell me frequency, and patient reports abdominal soreness.  She denies loose stools.  She denies difficulty breathing, no chest pain no cough.  She denies pain with urination.    Assessment & Plan:   Principal Problem:   Acute metabolic encephalopathy Active Problems:   Essential hypertension   Schizophrenia (HCC)   Type 2 diabetes mellitus without complication (HCC)   UTI (urinary tract infection)   Uterine mass   Nonspecific abdominal pain -suspect secondary to urinary tract infection with cystitis, treating with IV fluids and IV antibiotics.  Patient seems to be tolerating clear liquid diet at this time and advance as tolerated.  Continue IV Protonix and Zofran as needed for rebound symptoms.  Acute metabolic encephalopathy-clinically seems to be improving resume all home medications and follow.  Schizophrenia - still awaiting home meds to be reconciled, asked pharmacy to resume home psychiatric medication.   UTI with cystitis - continue IV ceftriaxone pending urine cultures  Essential hypertension - add  amlodipine daily, hydralazine PRN  Poor IV access - staffing unable to get IV after multiple attempts, ordered for PICC line.  ** update: still waiting for PICC line placement on 5/31  DVT prophylaxis: enoxaparin  Code Status: full  Family Communication: patient  Disposition: home  Status is: Inpatient  Remains inpatient appropriate because:IV treatments appropriate due to intensity of illness or inability to take PO and Inpatient level of care appropriate due to severity of illness   Dispo: The patient is from: Home              Anticipated d/c is to: Home              Patient currently is not medically stable to d/c.   Difficult to place patient No    Consultants:     Procedures:     Antimicrobials:     Subjective: Pt says pain persists but not as severe, tolerating the heart diet.    Objective: Vitals:   01/18/21 0445 01/18/21 1346 01/18/21 2030 01/19/21 0418  BP: (!) 163/91 (!) 159/92 (!) 141/82 (!) 144/74  Pulse: 92 89 82 81  Resp: 18 18 18 19   Temp: 98.1 F (36.7 C) 98.5 F (36.9 C) 98.2 F (36.8 C) 98.2 F (36.8 C)  TempSrc:      SpO2: 100% 100% 99% 99%  Weight:      Height:        Intake/Output Summary (Last 24 hours) at 01/19/2021 1048 Last data filed at 01/18/2021 1803 Gross per 24 hour  Intake 480 ml  Output --  Net 480 ml   [employer]   01/17/21  1454  Weight: 90.7 kg    Examination:  General exam: Appears calm and comfortable  Respiratory system: Clear to auscultation. Respiratory effort normal. Cardiovascular system: normal S1 & S2 heard. No JVD, murmurs, rubs, gallops or clicks. No pedal edema. Gastrointestinal system: Abdomen is nondistended, soft and nontender. No organomegaly or masses felt. Normal bowel sounds heard. Central nervous system: Alert and oriented. No focal neurological deficits. Extremities: Symmetric 5 x 5 power. Skin: No rashes, lesions or ulcers Psychiatry: Judgement and insight appear normal. Mood & affect  appropriate.   Data Reviewed: I have personally reviewed following labs and imaging studies  CBC: Recent Labs  Lab 01/17/21 1547 01/18/21 0630 01/19/21 0531  WBC 9.3 5.8 5.8  NEUTROABS 6.8  --  2.2  HGB 12.9 11.9* 12.4  HCT 41.0 36.8 39.1  MCV 95.3 92.9 93.3  PLT 233 222 643    Basic Metabolic Panel: Recent Labs  Lab 01/17/21 1547 01/18/21 0630 01/19/21 0531  NA 139 140 140  K 3.4* 3.2* 3.6  CL 109 111 111  CO2 20* 22 22  GLUCOSE 111* 113* 146*  BUN 15 9 10   CREATININE 0.68 0.46 0.46  CALCIUM 9.2 9.1 9.2  MG 1.8  --  1.8    GFR: Estimated Creatinine Clearance: 97.6 mL/min (by C-G formula based on SCr of 0.46 mg/dL).  Liver Function Tests: Recent Labs  Lab 01/17/21 1547 01/19/21 0531  AST 16 12*  ALT 14 12  ALKPHOS 87 76  BILITOT 0.4 0.5  PROT 8.1 7.1  ALBUMIN 4.2 3.7    CBG: Recent Labs  Lab 01/18/21 0754 01/18/21 1116 01/18/21 1630 01/18/21 2031 01/19/21 0717  GLUCAP 127* 141* 156* 176* 132*    Recent Results (from the past 240 hour(s))  Urine culture     Status: Abnormal (Preliminary result)   Collection Time: 01/17/21  3:10 PM   Specimen: Urine, Catheterized  Result Value Ref Range Status   Specimen Description   Final    URINE, CATHETERIZED Performed at [facility], [address], [place], [place] [address]    Special Requests   Final    NONE Performed at [facility], [address] [place], [place] [address]    Culture (A)  Final    >=100,000 COLONIES/mL KLEBSIELLA PNEUMONIAE >=100,000 COLONIES/mL VIRIDANS STREPTOCOCCUS Standardized susceptibility testing for this organism is not available. Performed at [facility], [place] [address], [place], [place] [address]    Report Status PENDING  Incomplete  Resp Panel by RT-PCR (Flu A&B, Covid) Nasopharyngeal Swab     Status: None   Collection Time: 01/17/21  3:19 PM   Specimen: Nasopharyngeal Swab; Nasopharyngeal(NP) swabs in vial transport medium  Result Value Ref Range Status    SARS Coronavirus 2 by RT PCR NEGATIVE NEGATIVE Final    Comment: (NOTE) SARS-CoV-2 target nucleic acids are NOT DETECTED.  The SARS-CoV-2 RNA is generally detectable in upper respiratory specimens during the acute phase of infection. The lowest concentration of SARS-CoV-2 viral copies this assay can detect is 138 copies/mL. A negative result does not preclude SARS-Cov-2 infection and should not be used as the sole basis for treatment or other patient management decisions. A negative result may occur with  improper specimen collection/handling, submission of specimen other than nasopharyngeal swab, presence of viral mutation(s) within the areas targeted by this assay, and inadequate number of viral copies(<138 copies/mL). A negative result must be combined with clinical observations, patient history, and epidemiological information. The expected result is Negative.  Fact Sheet for Patients:  EntrepreneurPulse.com.au  Fact Sheet for Healthcare Providers:  IncredibleEmployment.be  This test is no t yet approved or cleared by the [place] FDA and  has been authorized for detection and/or diagnosis of SARS-CoV-2 by FDA under an Emergency Use Authorization (EUA). This EUA will remain  in effect (meaning this test can be used) for the duration of the COVID-19 declaration under Section 564(b)(1) of the Act, 21 U.S.C.section 360bbb-3(b)(1), unless the authorization is terminated  or revoked sooner.       Influenza A by PCR NEGATIVE NEGATIVE Final   Influenza B by PCR NEGATIVE NEGATIVE Final    Comment: (NOTE) The Xpert Xpress SARS-CoV-2/FLU/RSV plus assay is intended as an aid in the diagnosis of influenza from Nasopharyngeal swab specimens and should not be used as a sole basis for treatment. Nasal washings and aspirates are unacceptable for Xpert Xpress SARS-CoV-2/FLU/RSV testing.  Fact Sheet for  Patients: EntrepreneurPulse.com.au  Fact Sheet for Healthcare Providers: IncredibleEmployment.be  This test is not yet approved or cleared by the [place] FDA and has been authorized for detection and/or diagnosis of SARS-CoV-2 by FDA under an Emergency Use Authorization (EUA). This EUA will remain in effect (meaning this test can be used) for the duration of the COVID-19 declaration under Section 564(b)(1) of the Act, 21 U.S.C. section 360bbb-3(b)(1), unless the authorization is terminated or revoked.  Performed at [facility], [address] [place], [place] [address]   Blood culture (routine single)     Status: None (Preliminary result)   Collection Time: 01/17/21  3:51 PM   Specimen: BLOOD  Result Value Ref Range Status   Specimen Description BLOOD BLOOD LEFT ARM  Final   Special Requests   Final    BOTTLES DRAWN AEROBIC AND ANAEROBIC Blood Culture adequate volume   Culture   Final    NO GROWTH 2 DAYS Performed at [facility], [address], [place], [place] [address]    Report Status PENDING  Incomplete     Radiology Studies: CT ABDOMEN PELVIS WO CONTRAST  Result Date: 01/17/2021 CLINICAL DATA:  Right-sided abdominal pain that comes and goes. Ankle swelling. One episode of vomiting. EXAM: CT ABDOMEN AND PELVIS WITHOUT CONTRAST TECHNIQUE: Multidetector CT imaging of the abdomen and pelvis was performed following the standard protocol without IV contrast. COMPARISON:  Right upper quadrant ultrasound, 10/16/2020. FINDINGS: Lower chest: Linear and reticular lung base opacities consistent with atelectasis. No acute findings. Hepatobiliary: No focal liver abnormality is seen. No gallstones, gallbladder wall thickening, or biliary dilatation. Pancreas: Unremarkable. No pancreatic ductal dilatation or surrounding inflammatory changes. Spleen: Normal in size without focal abnormality. Adrenals/Urinary Tract: No adrenal masses. Kidneys normal in  size, orientation and position. No renal mass, stone or hydronephrosis. Normal ureters. Bladder demonstrates non dependent air. No bladder wall thickening, mass or stone. Stomach/Bowel: Normal stomach. Small bowel and colon are normal in caliber. No wall thickening. No inflammation. Mild generalized increase in the colonic stool burden. Normal appendix visualized. Vascular/Lymphatic: Aortic atherosclerotic calcifications. No aneurysm. No enlarged lymph nodes. Reproductive: Buttock elated mass extends from the right aspect of the uterus consistent with a fibroid, measuring 6.5 x 5.7 x 6.3 cm. Although this appears to be a pedunculated fibroid, and adnexal mass is not completely excluded. Other: Trace pelvic free fluid.  No hernia. Musculoskeletal: No acute or significant osseous findings. IMPRESSION: 1. Non dependent air within the bladder. This suggests recent instrumentation. If there has been no instrumentation, consider bladder infection in the proper clinical  setting. 2. No other evidence of an acute abnormality within the abdomen or pelvis. 3. Mild generalized increase in the colonic stool burden. No bowel obstruction or inflammation. 4. Mass along the right aspect of the uterus, most likely a pedunculated fibroid, less likely an adnexal mass. This measures 6.5 cm in greatest dimension, and could be further assessed with pelvic ultrasound with transabdominal and endovaginal imaging. 5. Aortic atherosclerosis. Electronically Signed   By: Lajean Manes M.D.   On: 01/17/2021 16:23   CT Head Wo Contrast  Result Date: 01/17/2021 CLINICAL DATA:  Altered mental status EXAM: CT HEAD WITHOUT CONTRAST TECHNIQUE: Contiguous axial images were obtained from the base of the skull through the vertex without intravenous contrast. COMPARISON:  10/14/2020 FINDINGS: Brain: No evidence of acute infarction, hemorrhage, hydrocephalus, extra-axial collection or mass lesion/mass effect. Scattered chronic white matter ischemic  changes are noted. Vascular: No hyperdense vessel or unexpected calcification. Skull: Normal. Negative for fracture or focal lesion. Sinuses/Orbits: No acute finding. Other: Mild soft tissue swelling is noted in the posterior occipital scalp on the left stable in appearance from the prior exam. IMPRESSION: Chronic white matter ischemic changes without acute abnormality. Electronically Signed   By: Inez Catalina M.D.   On: 01/17/2021 17:03   DG Chest Port 1 View  Result Date: 01/17/2021 CLINICAL DATA:  Right-sided chest pain, possible sepsis EXAM: PORTABLE CHEST 1 VIEW COMPARISON:  10/14/2020 FINDINGS: Cardiac shadow is stable. Aortic calcifications are again seen. Mild vascular prominence is noted stable from the prior study. No focal infiltrate is seen. No bony abnormality is noted. IMPRESSION: Mild vascular congestion.  Stable from the prior exam. Electronically Signed   By: Inez Catalina M.D.   On: 01/17/2021 15:29   [place] EKG SITE RITE  Result Date: 01/18/2021 If Site Rite image not attached, placement could not be confirmed due to current cardiac rhythm.   Scheduled Meds: . acetaZOLAMIDE  500 mg Oral BID  . amLODipine  5 mg Oral Daily  . aspirin EC  81 mg Oral q morning  . atorvastatin  10 mg Oral QPM  . benztropine  0.5 mg Oral BID  . brimonidine  1 drop Left Eye TID  . colchicine  0.6 mg Oral Daily  . divalproex  1,000 mg Oral QHS  . dorzolamide  1 drop Left Eye BID  . enoxaparin (LOVENOX) injection  40 mg Subcutaneous Q24H  . gabapentin  400 mg Oral QID  . hydrOXYzine  25 mg Oral TID  . insulin aspart  0-5 Units Subcutaneous QHS  . insulin aspart  0-9 Units Subcutaneous TID WC  . insulin glargine  20 Units Subcutaneous BID  . lactulose  20 g Oral BID  . pantoprazole (PROTONIX) IV  40 mg Intravenous Q24H  . QUEtiapine  800 mg Oral QPM  . risperiDONE  3 mg Oral BID  . timolol  1 drop Left Eye BID  . umeclidinium bromide  1 puff Inhalation Daily   Continuous Infusions: . 0.9 % NaCl  with KCl 20 mEq / L    . cefTRIAXone (ROCEPHIN)  IV    . magnesium sulfate bolus IVPB Stopped (01/18/21 1412)     LOS: 1 day   Time spent: 35 mins   Dois Juarbe Wynetta Emery, MD How to contact the [facility] Attending or Consulting provider [place] or covering provider during after hours [place], for this patient?  1. Check the care team in [facility] and look for a) attending/consulting [place] provider listed and b) the  [place] team listed 2. Log into [email or web address] and use [place]'s universal password to access. If you do not have the password, please contact the hospital operator. 3. Locate the [facility] provider you are looking for under [facility] and page to a number that you can be directly reached. 4. If you still have difficulty reaching the provider, please page the [facility] (Director on Call) for the Hospitalists listed on amion for assistance.  01/19/2021, 10:48 AM

## 2021-01-20 DIAGNOSIS — N390 Urinary tract infection, site not specified: Secondary | ICD-10-CM

## 2021-01-20 DIAGNOSIS — R319 Hematuria, unspecified: Secondary | ICD-10-CM

## 2021-01-20 DIAGNOSIS — F203 Undifferentiated schizophrenia: Secondary | ICD-10-CM | POA: Diagnosis not present

## 2021-01-20 DIAGNOSIS — G9341 Metabolic encephalopathy: Secondary | ICD-10-CM | POA: Diagnosis not present

## 2021-01-20 DIAGNOSIS — I10 Essential (primary) hypertension: Secondary | ICD-10-CM | POA: Diagnosis not present

## 2021-01-20 LAB — COMPREHENSIVE METABOLIC PANEL
ALT: 11 U/L (ref 0–44)
AST: 12 U/L — ABNORMAL LOW (ref 15–41)
Albumin: 3.4 g/dL — ABNORMAL LOW (ref 3.5–5.0)
Alkaline Phosphatase: 74 U/L (ref 38–126)
Anion gap: 4 — ABNORMAL LOW (ref 5–15)
BUN: 14 mg/dL (ref 6–20)
CO2: 23 mmol/L (ref 22–32)
Calcium: 9 mg/dL (ref 8.9–10.3)
Chloride: 111 mmol/L (ref 98–111)
Creatinine, Ser: 0.52 mg/dL (ref 0.44–1.00)
GFR, Estimated: >60 mL/min (ref >60)
Glucose, Bld: 256 mg/dL — ABNORMAL HIGH (ref 70–99)
Potassium: 3.9 mmol/L (ref 3.5–5.1)
Sodium: 138 mmol/L (ref 135–145)
Total Bilirubin: 0.1 mg/dL — ABNORMAL LOW (ref 0.3–1.2)
Total Protein: 6.8 g/dL (ref 6.5–8.1)

## 2021-01-20 LAB — URINE CULTURE: Culture: >=100000 — AB

## 2021-01-20 LAB — GLUCOSE, CAPILLARY
Glucose-Capillary: 222 mg/dL — ABNORMAL HIGH (ref 70–99)
Glucose-Capillary: 292 mg/dL — ABNORMAL HIGH (ref 70–99)

## 2021-01-20 MED ORDER — AMLODIPINE BESYLATE 5 MG PO TABS: 5.0000 mg | ORAL_TABLET | Freq: Every day | ORAL | 2 refills | Status: DC

## 2021-01-20 MED ORDER — CEFDINIR 300 MG PO CAPS: 300.0000 mg | ORAL_CAPSULE | Freq: Two times a day (BID) | ORAL | 0 refills | Status: DC

## 2021-01-20 MED ORDER — SODIUM CHLORIDE 0.9 % IV SOLN
1.0000 g | INTRAVENOUS | Status: DC
  Administered 2021-01-20: 1 g via INTRAVENOUS
  Filled 2021-01-20: qty 10

## 2021-01-20 MED ORDER — UMECLIDINIUM BROMIDE 62.5 MCG/INH IN AEPB
1.0000 | INHALATION_SPRAY | Freq: Every day | RESPIRATORY_TRACT | Status: DC
  Administered 2021-01-20: 1 via RESPIRATORY_TRACT

## 2021-01-20 NOTE — Evaluation (Addendum)
Physical Therapy Evaluation Patient Details Name: Regina Watson MRN: [id number] DOB: [birth year] Today's Date: 01/20/2021   History of Present Illness  Regina Watson is a 55 y.o. female with medical history significant for schizophrenia, hypertension, diabetes mellitus, blind.  Patient was brought to the ED with complaints of right-sided abdominal pain, vomiting, reported swelling in ankles.  Initial history by ED provider from patient was limited, as at that time patient was somnolent, and so was slurring her words and speech was garbled.  At the time of my evaluation she is awake and alert, able to give me history, speech occasionally appears jumbled together, but she is able to clarify and tells me this is how she normally talks and her speech is unchanged.    Patient reports vomiting that started yesterday, she is unable to tell me frequency, and patient reports abdominal soreness.  She denies loose stools.  She denies difficulty breathing, no chest pain no cough.  She denies pain with urination.    Clinical Impression  Patient presented mildly somnolent in bed. Patient was able to transfer to EOB for therapeutic activities and gait training with min assist. Patient started out without a RW but was unable to safely navigate across the room. With addition of RW patient was able to ambulate 30 with min guard. Patient demonstrated good return to chair after ambulation. Patient left in chair - RN notified. Patient will benefit from continued physical therapy in hospital and recommended venue below to increase strength, balance, endurance for safe ADLs and gait.     Follow Up Recommendations SNF;Supervision for mobility/OOB;Supervision - Intermittent    Equipment Recommendations  Rolling walker with 5" wheels    Recommendations for Other Services       Precautions / Restrictions Precautions Precautions: Fall Restrictions Weight Bearing Restrictions: No      Mobility  Bed  Mobility Overal bed mobility: Modified Independent Bed Mobility: Supine to Sit     Supine to sit: Modified independent (Device/Increase time)          Transfers Overall transfer level: Modified independent Equipment used: Rolling walker (2 wheeled)                Ambulation/Gait Ambulation/Gait assistance: Supervision;Min guard Gait Distance (Feet): 30 Feet Assistive device: Rolling walker (2 wheeled) Gait Pattern/deviations: Decreased step length - right;Decreased step length - left;Decreased stride length;Decreased weight shift to right;Decreased weight shift to left Gait velocity: decreased   General Gait Details: Patient presents with a shuffling gait pattern and requires assistance to navigate obsticles  Stairs            Wheelchair Mobility    Modified Rankin (Stroke Patients Only)       Balance Overall balance assessment: Modified Independent                                           Pertinent Vitals/Pain Pain Assessment: No/denies pain    Home Living Family/patient expects to be discharged to:: Assisted living               Home Equipment: None      Prior Function Level of Independence: Needs assistance   Gait / Transfers Assistance Needed: household ambulater without AD, required gudance during ambulation due to poor eye sight  ADL's / Homemaking Assistance Needed: assisted by ALF        Hand Dominance  Extremity/Trunk Assessment   Upper Extremity Assessment Upper Extremity Assessment: Generalized weakness    Lower Extremity Assessment Lower Extremity Assessment: Generalized weakness    Cervical / Trunk Assessment Cervical / Trunk Assessment: Normal  Communication   Communication: No difficulties  Cognition Arousal/Alertness: Awake/alert Behavior During Therapy: WFL for tasks assessed/performed Overall Cognitive Status: Within Functional Limits for tasks assessed                                         General Comments      Exercises     Assessment/Plan    PT Assessment Patient needs continued PT services  PT Problem List Decreased strength;Decreased activity tolerance;Decreased balance;Decreased mobility;Decreased safety awareness;Decreased knowledge of precautions       PT Treatment Interventions Gait training;Functional mobility training;Therapeutic activities;Therapeutic exercise;Patient/family education    PT Goals (Current goals can be found in the Care Plan section)  Acute Rehab PT Goals Patient Stated Goal: return to ALF PT Goal Formulation: With patient Time For Goal Achievement: 02/03/21 Potential to Achieve Goals: Good    Frequency Min 2X/week   Barriers to discharge        Co-evaluation               AM-PAC PT "6 Clicks" Mobility  Outcome Measure Help needed turning from your back to your side while in a flat bed without using bedrails?: A Little Help needed moving from lying on your back to sitting on the side of a flat bed without using bedrails?: A Little Help needed moving to and from a bed to a chair (including a wheelchair)?: A Little Help needed standing up from a chair using your arms (e.g., wheelchair or bedside chair)?: A Little Help needed to walk in hospital room?: A Lot Help needed climbing 3-5 steps with a railing? : A Lot 6 Click Score: 16    End of Session   Activity Tolerance: Patient tolerated treatment well;Patient limited by fatigue Patient left: in chair;with call bell/phone within reach;with bed alarm set Nurse Communication: Mobility status PT Visit Diagnosis: Unsteadiness on feet (R26.81);Other abnormalities of gait and mobility (R26.89);Muscle weakness (generalized) (M62.81)    Time: 3358-2518 PT Time Calculation (min) (ACUTE ONLY): 30 min   Charges:   PT Evaluation $PT Eval Moderate Complexity: 1 Mod PT Treatments $Therapeutic Activity: 23-37 mins       11:25 AM, 01/20/21 Sinclair Ship SPT  11:25 AM, 01/20/21 Lonell Grandchild, MPT Physical Therapist with [facility] 336 [phone] office [phone] mobile phone

## 2021-01-20 NOTE — Clinical Social Work Note (Addendum)
Patient is from [facility] ([facility] [address] [place], [place] [address]. Spoke with Jewel Baize owner, discussed PT eval. Patient is at baseline. He states that patient can return to the facility. Staff will pick her up at 4:00 p.m.  RW ordered through [facility] at [place].  Message left for legal guardian requesting return contact.  No HH companies accepting, referred to [facility] in [place].  Shandricka Monroy, Clydene Pugh, LCSW

## 2021-01-20 NOTE — NC FL2 (Deleted)
[place] LEVEL OF CARE SCREENING TOOL     IDENTIFICATION  Patient Name: Regina Watson Birthdate: 1966-05-01 Sex: female Admission Date (Current Location): 01/17/2021  [place] and [place] Number:  [facility] [id number] [place] and Address:  [facility] [address], [place]      Provider Number: [id number]  Attending Physician Name and Address:  Orson Eva, MD  Relative Name and Phone Number:  Heloise Beecham ([place])   [phone]    Current Level of Care: Hospital Recommended Level of Care: Family Care Home Prior Approval Number:    Date Approved/Denied:   PASRR Number:    Discharge Plan: Domiciliary (Rest home)    Current Diagnoses: Patient Active Problem List   Diagnosis Date Noted  . Acute metabolic encephalopathy 06/07/5101  . Uterine mass 01/17/2021  . Altered mental status 10/15/2020  . Leukocytosis 10/15/2020  . AKI (acute kidney injury) ([place]) 10/15/2020  . Hyperglycemia due to diabetes mellitus ([place]) 10/15/2020  . Hyperlipidemia 10/15/2020  . Diabetic neuropathy ([place]) 10/15/2020  . UTI (urinary tract infection) 10/15/2019  . Encephalopathy acute 10/14/2019  . Schizophrenia ([place]) 10/14/2019  . Type 2 diabetes mellitus without complication ([place]) 58/52/7782  . Acute encephalopathy 10/14/2019  . Palpitations 10/04/2016  . Essential hypertension 10/04/2016  . Tobacco use 10/04/2016  . Tachycardia 10/04/2016    Orientation RESPIRATION BLADDER Height & Weight     Self,Situation,Place,Time  Normal Continent Weight: 200 lb (90.7 kg) Height:  5\' 9"  (175.3 cm)  BEHAVIORAL SYMPTOMS/MOOD NEUROLOGICAL BOWEL NUTRITION STATUS      Continent Diet (heart healthy/carb modified)  AMBULATORY STATUS COMMUNICATION OF NEEDS Skin   Supervision Verbally Normal                       Personal Care Assistance Level of Assistance  Bathing,Dressing,Feeding Bathing Assistance: Limited assistance Feeding assistance:  Independent Dressing Assistance: Limited assistance     Functional Limitations Info             SPECIAL CARE FACTORS FREQUENCY  PT (By licensed PT)     PT Frequency: 3x/week              Contractures Contractures Info: Not present    Additional Factors Info  Code Status,Allergies,Psychotropic Code Status Info: Full Code Allergies Info: Bactrim, Penicillins, Latex Psychotropic Info: Depakote         Current Medications (01/20/2021):  This is the current hospital active medication list Current Facility-Administered Medications  Medication Dose Route Frequency Provider Last Rate Last Admin  . 0.9 % NaCl with KCl 20 mEq/ L  infusion   Intravenous Continuous Wynetta Emery, Clanford L, MD 70 mL/hr at 01/20/21 0944 New Bag at 01/20/21 0944  . acetaminophen (TYLENOL) tablet 650 mg  650 mg Oral Q6H PRN Emokpae, Ejiroghene E, MD   650 mg at 01/19/21 1713   Or  . acetaminophen (TYLENOL) suppository 650 mg  650 mg Rectal Q6H PRN Emokpae, Ejiroghene E, MD      . acetaZOLAMIDE (DIAMOX) 12 hr capsule 500 mg  500 mg Oral BID Wynetta Emery, Clanford L, MD   500 mg at 01/20/21 0946  . amLODipine (NORVASC) tablet 5 mg  5 mg Oral Daily Johnson, Clanford L, MD   5 mg at 01/20/21 0946  . aspirin EC tablet 81 mg  81 mg Oral q morning Johnson, Clanford L, MD   81 mg at 01/20/21 0947  . atorvastatin (LIPITOR) tablet 10 mg  10 mg Oral QPM Johnson, Clanford  L, MD   10 mg at 01/19/21 1716  . benztropine (COGENTIN) tablet 0.5 mg  0.5 mg Oral BID Wynetta Emery, Clanford L, MD   0.5 mg at 01/20/21 0946  . brimonidine (ALPHAGAN) 0.2 % ophthalmic solution 1 drop  1 drop Left Eye TID Wynetta Emery, Clanford L, MD   1 drop at 01/20/21 0948  . cefTRIAXone (ROCEPHIN) 1 g in sodium chloride 0.9 % 100 mL IVPB  1 g Intravenous Q24H Tat, David, MD 200 mL/hr at 01/20/21 1230 1 g at 01/20/21 1230  . Chlorhexidine Gluconate Cloth 2 % PADS 6 each  6 each Topical Daily Murlean Iba, MD   6 each at 01/20/21 0947  . colchicine tablet  0.6 mg  0.6 mg Oral Daily Johnson, Clanford L, MD   0.6 mg at 01/20/21 0945  . divalproex (DEPAKOTE ER) 24 hr tablet 1,000 mg  1,000 mg Oral QHS Johnson, Clanford L, MD   1,000 mg at 01/19/21 2151  . dorzolamide (TRUSOPT) 2 % ophthalmic solution 1 drop  1 drop Left Eye BID Wynetta Emery, Clanford L, MD   1 drop at 01/20/21 0949  . enoxaparin (LOVENOX) injection 40 mg  40 mg Subcutaneous Q24H Emokpae, Ejiroghene E, MD   40 mg at 01/19/21 2152  . gabapentin (NEURONTIN) capsule 400 mg  400 mg Oral QID Johnson, Clanford L, MD   400 mg at 01/20/21 0945  . hydrALAZINE (APRESOLINE) injection 10 mg  10 mg Intravenous Q4H PRN Johnson, Clanford L, MD      . hydrOXYzine (ATARAX/VISTARIL) tablet 25 mg  25 mg Oral TID Wynetta Emery, Clanford L, MD   25 mg at 01/20/21 0945  . insulin aspart (novoLOG) injection 0-5 Units  0-5 Units Subcutaneous QHS Emokpae, Ejiroghene E, MD   2 Units at 01/19/21 2154  . insulin aspart (novoLOG) injection 0-9 Units  0-9 Units Subcutaneous TID WC Emokpae, Ejiroghene E, MD   5 Units at 01/20/21 1228  . insulin glargine (LANTUS) injection 20 Units  20 Units Subcutaneous BID Murlean Iba, MD   20 Units at 01/20/21 0948  . lactulose (CHRONULAC) 10 GM/15ML solution 20 g  20 g Oral BID Wynetta Emery, Clanford L, MD   20 g at 01/20/21 0947  . magnesium sulfate IVPB 2 g 50 mL  2 g Intravenous Once Murlean Iba, MD   Held at 01/18/21 1412  . ondansetron (ZOFRAN) tablet 4 mg  4 mg Oral Q6H PRN Emokpae, Ejiroghene E, MD   4 mg at 01/20/21 0256   Or  . ondansetron (ZOFRAN) injection 4 mg  4 mg Intravenous Q6H PRN Emokpae, Ejiroghene E, MD      . pantoprazole (PROTONIX) injection 40 mg  40 mg Intravenous Q24H Emokpae, Ejiroghene E, MD   40 mg at 01/19/21 2152  . polyethylene glycol (MIRALAX / GLYCOLAX) packet 17 g  17 g Oral Daily PRN Emokpae, Ejiroghene E, MD      . QUEtiapine (SEROQUEL XR) 24 hr tablet 800 mg  800 mg Oral QPM Johnson, Clanford L, MD   800 mg at 01/19/21 1713  . risperiDONE  (RISPERDAL M-TABS) disintegrating tablet 3 mg  3 mg Oral BID Wynetta Emery, Clanford L, MD   3 mg at 01/20/21 0946  . sodium chloride flush (NS) 0.9 % injection 10-40 mL  10-40 mL Intracatheter Q12H Johnson, Clanford L, MD   10 mL at 01/20/21 0949  . sodium chloride flush (NS) 0.9 % injection 10-40 mL  10-40 mL Intracatheter PRN Murlean Iba, MD      .  timolol (TIMOPTIC) 0.5 % ophthalmic solution 1 drop  1 drop Left Eye BID Wynetta Emery, Clanford L, MD   1 drop at 01/20/21 0948  . umeclidinium bromide (INCRUSE ELLIPTA) 62.5 MCG/INH 1 puff  1 puff Inhalation Daily Wynetta Emery, Clanford L, MD   1 puff at 01/20/21 0807     Discharge Medications: Please see discharge summary for a list of discharge medications.  Relevant Imaging Results:  Relevant Lab Results:   Additional Information SS# [id number]  Ihor Gully, LCSW

## 2021-01-20 NOTE — Discharge Summary (Signed)
Physician Discharge Summary  Regina Watson OHY:[id number] DOB: 06-Jul-1966 DOA: 01/17/2021  PCP: Clovia Cuff, MD  Admit date: 01/17/2021 Discharge date: 01/20/2021  Admitted From: ALF Disposition:  ALF  Recommendations for Outpatient Follow-up:  1. Follow up with PCP in 1-2 weeks 2. Please obtain BMP/CBC in one week   Home Health:YES Equipment/Devices:   Discharge Condition: Stable CODE STATUS: FULL Diet recommendation: Heart Healthy / Carb Modified    Brief/Interim Summary: 55 y.o.femalewith medical history significant forschizophrenia, hypertension, diabetes mellitus, blind. Patient was brought to the ED with complaints of right-sided abdominal pain, vomiting, reported swelling in ankles.Initial history by ED provider from patient was limited, as at that timepatient was somnolent,and sowas slurring her words and speech was garbled.At the timeof my evaluation she is awake and alert,able to give me history, speech occasionally appears jumbled together, but she is able to clarify and tells me this is how she normally talks and her speech is unchanged. Patient reports vomiting that started yesterday, she is unable to tell me frequency, and patient reports abdominal soreness. She denies loose stools. She denies difficulty breathing, no chest pain no cough. She denies pain with urination.    Discharge Diagnoses:  Pyelonephritis -presented with abd pain, n/v and UTI -urine culture = Klebsiella and viridans strep -started on ceftriaxone -d/c home with cefdinir x 5 more days -n/v improved and patient tolerating diet at time of d/c  Acute metabolic encephalopathy -initially somnolent with garbled speech -on day of d/c--speech is clear and patient A&O x 3 -due to UTI and dehydration  Schizophrenia  -continue home meds  Essential hypertension - amlodipine added this hospitalization  Uncontrolled Diabetes Mellitus type 2 with hyperglycemia -10/15/20  A1C--8.2 -restart lantus and novolog sliding scale  Constipation -continue lactulose  Hypokalemia -repleted  Hyperlipidemia -continue statin  Discharge Instructions   Allergies as of 01/20/2021      Reactions   Bactrim [sulfamethoxazole-trimethoprim] Hives   Penicillins    Has patient had a PCN reaction causing immediate rash, facial/tongue/throat swelling, SOB or lightheadedness with hypotension: UNKNOWN Has patient had a PCN reaction causing severe rash involving mucus membranes or skin necrosis: UNKNOWN Has patient had a PCN reaction that required hospitalization: UNKNOWN Has patient had a PCN reaction occurring within the last 10 years: UNKNOWN If all of the above answers are "NO", then may proceed with Cephalosporin use.   Latex Itching      Medication List    TAKE these medications   acetaminophen 325 MG tablet Commonly known as: TYLENOL Take 650 mg by mouth every 4 (four) hours as needed for mild pain or moderate pain.   acetaZOLAMIDE 500 MG capsule Commonly known as: DIAMOX Take 500 mg by mouth 2 (two) times daily.   amLODipine 5 MG tablet Commonly known as: NORVASC Take 1 tablet (5 mg total) by mouth daily. Start taking on: January 21, 2021   aspirin EC 81 MG tablet Take 81 mg by mouth every morning.   atorvastatin 10 MG tablet Commonly known as: LIPITOR Take 10 mg by mouth every evening.   benztropine 0.5 MG tablet Commonly known as: COGENTIN Take 0.5 mg by mouth 2 (two) times daily.   brimonidine 0.2 % ophthalmic solution Commonly known as: ALPHAGAN Place 1 drop into the left eye every 8 (eight) hours.   cefdinir 300 MG capsule Commonly known as: OMNICEF Take 1 capsule (300 mg total) by mouth 2 (two) times daily.   divalproex 500 MG 24 hr tablet Commonly known as: DEPAKOTE ER Take  1 tablet (500 mg total) by mouth at bedtime. What changed: how much to take   dorzolamide 2 % ophthalmic solution Commonly known as: TRUSOPT Place 1 drop into the  left eye 2 (two) times daily.   furosemide 40 MG tablet Commonly known as: LASIX Take 1 tablet (40 mg total) by mouth daily as needed for fluid or edema.   gabapentin 400 MG capsule Commonly known as: NEURONTIN Take 1 capsule (400 mg total) by mouth 3 (three) times daily. What changed: when to take this   hydrOXYzine 25 MG tablet Commonly known as: ATARAX/VISTARIL Take 25 mg by mouth 3 (three) times daily.   insulin aspart 100 UNIT/ML FlexPen Commonly known as: NOVOLOG Inject 11-16 Units into the skin 3 (three) times daily with meals. SLIDING SCALE: 150-200= 11 units 201-250= 12 units 251-300= 13 units 301-350= 14 units 351-400= 15 units >400=      16 units Call if less than 70 or greater than 300 for 3 tests  In a row   insulin glargine 100 UNIT/ML injection Commonly known as: LANTUS Inject 0.14 mLs (14 Units total) into the skin at bedtime. What changed:   how much to take  when to take this   lactulose 10 GM/15ML solution Commonly known as: CHRONULAC Take 30 mLs (20 g total) by mouth 2 (two) times daily.   levocetirizine 5 MG tablet Commonly known as: XYZAL Take 5 mg by mouth every evening.   metFORMIN 1000 MG tablet Commonly known as: GLUCOPHAGE Take 1,000 mg by mouth 2 (two) times daily with a meal.   Mitigare 0.6 MG Caps Generic drug: Colchicine Take 1 capsule by mouth daily.   polyethylene glycol 17 g packet Commonly known as: MIRALAX / GLYCOLAX Take 17 g by mouth daily.   prednisoLONE acetate 1 % ophthalmic suspension Commonly known as: PRED FORTE Place 1 drop into both eyes 4 (four) times daily.   QUEtiapine 200 MG 24 hr tablet Commonly known as: SEROQUEL XR Take 1 tablet (200 mg total) by mouth every evening. What changed: how much to take   risperidone 3 MG disintegrating tablet Commonly known as: RISPERDAL M-TABS Take 3 mg by mouth 2 (two) times daily.   sennosides-docusate sodium 8.6-50 MG tablet Commonly known as: SENOKOT-S Take 1  tablet by mouth in the morning and at bedtime.   timolol 0.5 % ophthalmic solution Commonly known as: TIMOPTIC Place 1 drop into the left eye 2 (two) times daily.   tiotropium 18 MCG inhalation capsule Commonly known as: SPIRIVA Place 18 mcg into inhaler and inhale daily.   Vitamin D (Ergocalciferol) 1.25 MG (50000 UNIT) Caps capsule Commonly known as: DRISDOL Take 50,000 Units by mouth every 7 (seven) days.       Allergies  Allergen Reactions  . Bactrim [Sulfamethoxazole-Trimethoprim] Hives  . Penicillins     Has patient had a PCN reaction causing immediate rash, facial/tongue/throat swelling, SOB or lightheadedness with hypotension: UNKNOWN Has patient had a PCN reaction causing severe rash involving mucus membranes or skin necrosis: UNKNOWN Has patient had a PCN reaction that required hospitalization: UNKNOWN Has patient had a PCN reaction occurring within the last 10 years: UNKNOWN If all of the above answers are "NO", then may proceed with Cephalosporin use.   . Latex Itching    Consultations:  none   Procedures/Studies: CT ABDOMEN PELVIS WO CONTRAST  Result Date: 01/17/2021 CLINICAL DATA:  Right-sided abdominal pain that comes and goes. Ankle swelling. One episode of vomiting. EXAM: CT ABDOMEN AND  PELVIS WITHOUT CONTRAST TECHNIQUE: Multidetector CT imaging of the abdomen and pelvis was performed following the standard protocol without IV contrast. COMPARISON:  Right upper quadrant ultrasound, 10/16/2020. FINDINGS: Lower chest: Linear and reticular lung base opacities consistent with atelectasis. No acute findings. Hepatobiliary: No focal liver abnormality is seen. No gallstones, gallbladder wall thickening, or biliary dilatation. Pancreas: Unremarkable. No pancreatic ductal dilatation or surrounding inflammatory changes. Spleen: Normal in size without focal abnormality. Adrenals/Urinary Tract: No adrenal masses. Kidneys normal in size, orientation and position. No renal  mass, stone or hydronephrosis. Normal ureters. Bladder demonstrates non dependent air. No bladder wall thickening, mass or stone. Stomach/Bowel: Normal stomach. Small bowel and colon are normal in caliber. No wall thickening. No inflammation. Mild generalized increase in the colonic stool burden. Normal appendix visualized. Vascular/Lymphatic: Aortic atherosclerotic calcifications. No aneurysm. No enlarged lymph nodes. Reproductive: Buttock elated mass extends from the right aspect of the uterus consistent with a fibroid, measuring 6.5 x 5.7 x 6.3 cm. Although this appears to be a pedunculated fibroid, and adnexal mass is not completely excluded. Other: Trace pelvic free fluid.  No hernia. Musculoskeletal: No acute or significant osseous findings. IMPRESSION: 1. Non dependent air within the bladder. This suggests recent instrumentation. If there has been no instrumentation, consider bladder infection in the proper clinical setting. 2. No other evidence of an acute abnormality within the abdomen or pelvis. 3. Mild generalized increase in the colonic stool burden. No bowel obstruction or inflammation. 4. Mass along the right aspect of the uterus, most likely a pedunculated fibroid, less likely an adnexal mass. This measures 6.5 cm in greatest dimension, and could be further assessed with pelvic ultrasound with transabdominal and endovaginal imaging. 5. Aortic atherosclerosis. Electronically Signed   By: Lajean Manes M.D.   On: 01/17/2021 16:23   CT Head Wo Contrast  Result Date: 01/17/2021 CLINICAL DATA:  Altered mental status EXAM: CT HEAD WITHOUT CONTRAST TECHNIQUE: Contiguous axial images were obtained from the base of the skull through the vertex without intravenous contrast. COMPARISON:  10/14/2020 FINDINGS: Brain: No evidence of acute infarction, hemorrhage, hydrocephalus, extra-axial collection or mass lesion/mass effect. Scattered chronic white matter ischemic changes are noted. Vascular: No hyperdense  vessel or unexpected calcification. Skull: Normal. Negative for fracture or focal lesion. Sinuses/Orbits: No acute finding. Other: Mild soft tissue swelling is noted in the posterior occipital scalp on the left stable in appearance from the prior exam. IMPRESSION: Chronic white matter ischemic changes without acute abnormality. Electronically Signed   By: Inez Catalina M.D.   On: 01/17/2021 17:03   DG Chest Port 1 View  Result Date: 01/17/2021 CLINICAL DATA:  Right-sided chest pain, possible sepsis EXAM: PORTABLE CHEST 1 VIEW COMPARISON:  10/14/2020 FINDINGS: Cardiac shadow is stable. Aortic calcifications are again seen. Mild vascular prominence is noted stable from the prior study. No focal infiltrate is seen. No bony abnormality is noted. IMPRESSION: Mild vascular congestion.  Stable from the prior exam. Electronically Signed   By: Inez Catalina M.D.   On: 01/17/2021 15:29   [place] EKG SITE RITE  Result Date: 01/18/2021 If Site Rite image not attached, placement could not be confirmed due to current cardiac rhythm.       Discharge Exam: Vitals:   01/20/21 0255 01/20/21 0808  BP: (!) 155/91   Pulse: 100   Resp: 20   Temp: 98.3 F (36.8 C)   SpO2: 99% 95%   Vitals:   01/19/21 1356 01/19/21 2121 01/20/21 0255 01/20/21 0808  BP: 133/88 Marland Kitchen)  157/93 (!) 155/91   Pulse: 98 96 100   Resp: 18 18 20    Temp: 98.4 F (36.9 C) 98.2 F (36.8 C) 98.3 F (36.8 C)   TempSrc: Oral     SpO2: 100% 100% 99% 95%  Weight:      Height:        General: Pt is alert, awake, not in acute distress Cardiovascular: RRR, S1/S2 +, no rubs, no gallops Respiratory: bibasilar rales. No wheeze Abdominal: Soft, NT, ND, bowel sounds + Extremities: no edema, no cyanosis   The results of significant diagnostics from this hospitalization (including imaging, microbiology, ancillary and laboratory) are listed below for reference.    Significant Diagnostic Studies: CT ABDOMEN PELVIS WO CONTRAST  Result Date:  01/17/2021 CLINICAL DATA:  Right-sided abdominal pain that comes and goes. Ankle swelling. One episode of vomiting. EXAM: CT ABDOMEN AND PELVIS WITHOUT CONTRAST TECHNIQUE: Multidetector CT imaging of the abdomen and pelvis was performed following the standard protocol without IV contrast. COMPARISON:  Right upper quadrant ultrasound, 10/16/2020. FINDINGS: Lower chest: Linear and reticular lung base opacities consistent with atelectasis. No acute findings. Hepatobiliary: No focal liver abnormality is seen. No gallstones, gallbladder wall thickening, or biliary dilatation. Pancreas: Unremarkable. No pancreatic ductal dilatation or surrounding inflammatory changes. Spleen: Normal in size without focal abnormality. Adrenals/Urinary Tract: No adrenal masses. Kidneys normal in size, orientation and position. No renal mass, stone or hydronephrosis. Normal ureters. Bladder demonstrates non dependent air. No bladder wall thickening, mass or stone. Stomach/Bowel: Normal stomach. Small bowel and colon are normal in caliber. No wall thickening. No inflammation. Mild generalized increase in the colonic stool burden. Normal appendix visualized. Vascular/Lymphatic: Aortic atherosclerotic calcifications. No aneurysm. No enlarged lymph nodes. Reproductive: Buttock elated mass extends from the right aspect of the uterus consistent with a fibroid, measuring 6.5 x 5.7 x 6.3 cm. Although this appears to be a pedunculated fibroid, and adnexal mass is not completely excluded. Other: Trace pelvic free fluid.  No hernia. Musculoskeletal: No acute or significant osseous findings. IMPRESSION: 1. Non dependent air within the bladder. This suggests recent instrumentation. If there has been no instrumentation, consider bladder infection in the proper clinical setting. 2. No other evidence of an acute abnormality within the abdomen or pelvis. 3. Mild generalized increase in the colonic stool burden. No bowel obstruction or inflammation. 4. Mass  along the right aspect of the uterus, most likely a pedunculated fibroid, less likely an adnexal mass. This measures 6.5 cm in greatest dimension, and could be further assessed with pelvic ultrasound with transabdominal and endovaginal imaging. 5. Aortic atherosclerosis. Electronically Signed   By: Lajean Manes M.D.   On: 01/17/2021 16:23   CT Head Wo Contrast  Result Date: 01/17/2021 CLINICAL DATA:  Altered mental status EXAM: CT HEAD WITHOUT CONTRAST TECHNIQUE: Contiguous axial images were obtained from the base of the skull through the vertex without intravenous contrast. COMPARISON:  10/14/2020 FINDINGS: Brain: No evidence of acute infarction, hemorrhage, hydrocephalus, extra-axial collection or mass lesion/mass effect. Scattered chronic white matter ischemic changes are noted. Vascular: No hyperdense vessel or unexpected calcification. Skull: Normal. Negative for fracture or focal lesion. Sinuses/Orbits: No acute finding. Other: Mild soft tissue swelling is noted in the posterior occipital scalp on the left stable in appearance from the prior exam. IMPRESSION: Chronic white matter ischemic changes without acute abnormality. Electronically Signed   By: Inez Catalina M.D.   On: 01/17/2021 17:03   DG Chest Port 1 View  Result Date: 01/17/2021 CLINICAL DATA:  Right-sided  chest pain, possible sepsis EXAM: PORTABLE CHEST 1 VIEW COMPARISON:  10/14/2020 FINDINGS: Cardiac shadow is stable. Aortic calcifications are again seen. Mild vascular prominence is noted stable from the prior study. No focal infiltrate is seen. No bony abnormality is noted. IMPRESSION: Mild vascular congestion.  Stable from the prior exam. Electronically Signed   By: Inez Catalina M.D.   On: 01/17/2021 15:29   [place] EKG SITE RITE  Result Date: 01/18/2021 If Site Rite image not attached, placement could not be confirmed due to current cardiac rhythm.    Microbiology: Recent Results (from the past 240 hour(s))  Urine culture      Status: Abnormal   Collection Time: 01/17/21  3:10 PM   Specimen: Urine, Catheterized  Result Value Ref Range Status   Specimen Description   Final    URINE, CATHETERIZED Performed at [facility], [address], [place], [place] [address]    Special Requests   Final    NONE Performed at [facility], [address], [place], [place] [address]    Culture (A)  Final    >=100,000 COLONIES/mL KLEBSIELLA PNEUMONIAE >=100,000 COLONIES/mL VIRIDANS STREPTOCOCCUS Standardized susceptibility testing for this organism is not available. Performed at [facility], [place] [address] [place], [place] [address]    Report Status 01/20/2021 FINAL  Final   Organism ID, Bacteria KLEBSIELLA PNEUMONIAE (A)  Final      Susceptibility   Klebsiella pneumoniae - MIC*    AMPICILLIN >=32 RESISTANT Resistant     CEFAZOLIN <=4 SENSITIVE Sensitive     CEFEPIME <=0.12 SENSITIVE Sensitive     CEFTRIAXONE <=0.25 SENSITIVE Sensitive     CIPROFLOXACIN <=0.25 SENSITIVE Sensitive     GENTAMICIN <=1 SENSITIVE Sensitive     IMIPENEM <=0.25 SENSITIVE Sensitive     NITROFURANTOIN 32 SENSITIVE Sensitive     TRIMETH/SULFA <=20 SENSITIVE Sensitive     AMPICILLIN/SULBACTAM 4 SENSITIVE Sensitive     PIP/TAZO <=4 SENSITIVE Sensitive     * >=100,000 COLONIES/mL KLEBSIELLA PNEUMONIAE  Resp Panel by RT-PCR (Flu A&B, Covid) Nasopharyngeal Swab     Status: None   Collection Time: 01/17/21  3:19 PM   Specimen: Nasopharyngeal Swab; Nasopharyngeal(NP) swabs in vial transport medium  Result Value Ref Range Status   SARS Coronavirus 2 by RT PCR NEGATIVE NEGATIVE Final    Comment: (NOTE) SARS-CoV-2 target nucleic acids are NOT DETECTED.  The SARS-CoV-2 RNA is generally detectable in upper respiratory specimens during the acute phase of infection. The lowest concentration of SARS-CoV-2 viral copies this assay can detect is 138 copies/mL. A negative result does not preclude SARS-Cov-2 infection and should not be used as the  sole basis for treatment or other patient management decisions. A negative result may occur with  improper specimen collection/handling, submission of specimen other than nasopharyngeal swab, presence of viral mutation(s) within the areas targeted by this assay, and inadequate number of viral copies(<138 copies/mL). A negative result must be combined with clinical observations, patient history, and epidemiological information. The expected result is Negative.  Fact Sheet for Patients:  EntrepreneurPulse.com.au  Fact Sheet for Healthcare Providers:  IncredibleEmployment.be  This test is no t yet approved or cleared by the [place] FDA and  has been authorized for detection and/or diagnosis of SARS-CoV-2 by FDA under an Emergency Use Authorization (EUA). This EUA will remain  in effect (meaning this test can be used) for the duration of the COVID-19 declaration under Section 564(b)(1) of the Act, 21 U.S.C.section 360bbb-3(b)(1), unless the authorization  is terminated  or revoked sooner.       Influenza A by PCR NEGATIVE NEGATIVE Final   Influenza B by PCR NEGATIVE NEGATIVE Final    Comment: (NOTE) The Xpert Xpress SARS-CoV-2/FLU/RSV plus assay is intended as an aid in the diagnosis of influenza from Nasopharyngeal swab specimens and should not be used as a sole basis for treatment. Nasal washings and aspirates are unacceptable for Xpert Xpress SARS-CoV-2/FLU/RSV testing.  Fact Sheet for Patients: EntrepreneurPulse.com.au  Fact Sheet for Healthcare Providers: IncredibleEmployment.be  This test is not yet approved or cleared by the [place] FDA and has been authorized for detection and/or diagnosis of SARS-CoV-2 by FDA under an Emergency Use Authorization (EUA). This EUA will remain in effect (meaning this test can be used) for the duration of the COVID-19 declaration under Section 564(b)(1) of the  Act, 21 U.S.C. section 360bbb-3(b)(1), unless the authorization is terminated or revoked.  Performed at [facility], [address], [place], [place] [address]   Blood culture (routine single)     Status: None (Preliminary result)   Collection Time: 01/17/21  3:51 PM   Specimen: BLOOD  Result Value Ref Range Status   Specimen Description BLOOD BLOOD LEFT ARM  Final   Special Requests   Final    BOTTLES DRAWN AEROBIC AND ANAEROBIC Blood Culture adequate volume   Culture   Final    NO GROWTH 3 DAYS Performed at [facility], [address], [place], [place] [address]    Report Status PENDING  Incomplete     Labs: Basic Metabolic Panel: Recent Labs  Lab 01/17/21 1547 01/18/21 0630 01/19/21 0531 01/20/21 0710  NA 139 140 140 138  K 3.4* 3.2* 3.6 3.9  CL 109 111 111 111  CO2 20* 22 22 23   GLUCOSE 111* 113* 146* 256*  BUN 15 9 10 14   CREATININE 0.68 0.46 0.46 0.52  CALCIUM 9.2 9.1 9.2 9.0  MG 1.8  --  1.8  --    Liver Function Tests: Recent Labs  Lab 01/17/21 1547 01/19/21 0531 01/20/21 0710  AST 16 12* 12*  ALT 14 12 11   ALKPHOS 87 76 74  BILITOT 0.4 0.5 0.1*  PROT 8.1 7.1 6.8  ALBUMIN 4.2 3.7 3.4*   No results for input(s): LIPASE, AMYLASE in the last 168 hours. Recent Labs  Lab 01/17/21 1547  AMMONIA 39*   CBC: Recent Labs  Lab 01/17/21 1547 01/18/21 0630 01/19/21 0531  WBC 9.3 5.8 5.8  NEUTROABS 6.8  --  2.2  HGB 12.9 11.9* 12.4  HCT 41.0 36.8 39.1  MCV 95.3 92.9 93.3  PLT 233 222 208   Cardiac Enzymes: No results for input(s): CKTOTAL, CKMB, CKMBINDEX, TROPONINI in the last 168 hours. BNP: Invalid input(s): POCBNP CBG: Recent Labs  Lab 01/19/21 1102 01/19/21 1551 01/19/21 2126 01/20/21 0721 01/20/21 1101  GLUCAP 193* 223* 208* 222* 292*    Time coordinating discharge:  36 minutes  Signed:  Orson Eva, DO [facility] Pager: [phone] 01/20/2021, 12:05 PM

## 2021-01-20 NOTE — NC FL2 (Signed)
[place] LEVEL OF CARE SCREENING TOOL     IDENTIFICATION  Patient Name: Regina Watson Birthdate: 06-Sep-1965 Sex: female Admission Date (Current Location): 01/17/2021  [place] and [place] Number:  [facility] [id number] [place] and Address:  [facility] [address] [place]      Provider Number: [id number]  Attending Physician Name and Address:  Orson Eva, MD  Relative Name and Phone Number:  Heloise Beecham ([place])   [phone]    Current Level of Care: Hospital Recommended Level of Care: [place] Prior Approval Number:    Date Approved/Denied:   PASRR Number:    Discharge Plan: Domiciliary (Rest home)    Current Diagnoses: Patient Active Problem List   Diagnosis Date Noted  . Acute metabolic encephalopathy 62/37/6283  . Uterine mass 01/17/2021  . Altered mental status 10/15/2020  . Leukocytosis 10/15/2020  . AKI (acute kidney injury) ([place]) 10/15/2020  . Hyperglycemia due to diabetes mellitus ([place]) 10/15/2020  . Hyperlipidemia 10/15/2020  . Diabetic neuropathy ([place]) 10/15/2020  . UTI (urinary tract infection) 10/15/2019  . Encephalopathy acute 10/14/2019  . Schizophrenia ([place]) 10/14/2019  . Type 2 diabetes mellitus without complication ([place]) 15/17/6160  . Acute encephalopathy 10/14/2019  . Palpitations 10/04/2016  . Essential hypertension 10/04/2016  . Tobacco use 10/04/2016  . Tachycardia 10/04/2016    Orientation RESPIRATION BLADDER Height & Weight     Self,Situation,Place,Time  Normal Continent Weight: 200 lb (90.7 kg) Height:  5\' 9"  (175.3 cm)  BEHAVIORAL SYMPTOMS/MOOD NEUROLOGICAL BOWEL NUTRITION STATUS      Continent Diet (heart healthy/carb modified)  AMBULATORY STATUS COMMUNICATION OF NEEDS Skin   Supervision Verbally Normal                       Personal Care Assistance Level of Assistance  Bathing,Dressing,Feeding Bathing Assistance: Limited assistance Feeding  assistance: Independent Dressing Assistance: Limited assistance     Functional Limitations Info             SPECIAL CARE FACTORS FREQUENCY  PT (By licensed PT)     PT Frequency: 3x/week              Contractures Contractures Info: Not present    Additional Factors Info  Insulin Sliding Scale Code Status Info: Full Code Allergies Info: Bactrim, Penicillins, Latex Psychotropic Info: Depakote Insulin Sliding Scale Info:  (150-200= 11 units; 201-250=12 units; 251-200=13units; 301-350=14units; 351-400=15units; >400= 16units. Call if less than 70 or greater than 300 for 3 test in a row.)       Current Medications (01/20/2021):  This is the current hospital active medication list Current Facility-Administered Medications  Medication Dose Route Frequency Provider Last Rate Last Admin  . 0.9 % NaCl with KCl 20 mEq/ L  infusion   Intravenous Continuous Wynetta Emery, Clanford L, MD 70 mL/hr at 01/20/21 0944 New Bag at 01/20/21 0944  . acetaminophen (TYLENOL) tablet 650 mg  650 mg Oral Q6H PRN Emokpae, Ejiroghene E, MD   650 mg at 01/19/21 1713   Or  . acetaminophen (TYLENOL) suppository 650 mg  650 mg Rectal Q6H PRN Emokpae, Ejiroghene E, MD      . acetaZOLAMIDE (DIAMOX) 12 hr capsule 500 mg  500 mg Oral BID Wynetta Emery, Clanford L, MD   500 mg at 01/20/21 0946  . amLODipine (NORVASC) tablet 5 mg  5 mg Oral Daily Johnson, Clanford L, MD   5 mg at 01/20/21 0946  . aspirin EC tablet 81 mg  81  mg Oral q morning Johnson, Clanford L, MD   81 mg at 01/20/21 0947  . atorvastatin (LIPITOR) tablet 10 mg  10 mg Oral QPM Johnson, Clanford L, MD   10 mg at 01/19/21 1716  . benztropine (COGENTIN) tablet 0.5 mg  0.5 mg Oral BID Wynetta Emery, Clanford L, MD   0.5 mg at 01/20/21 0946  . brimonidine (ALPHAGAN) 0.2 % ophthalmic solution 1 drop  1 drop Left Eye TID Wynetta Emery, Clanford L, MD   1 drop at 01/20/21 0948  . cefTRIAXone (ROCEPHIN) 1 g in sodium chloride 0.9 % 100 mL IVPB  1 g Intravenous Q24H Tat, David, MD  200 mL/hr at 01/20/21 1230 1 g at 01/20/21 1230  . Chlorhexidine Gluconate Cloth 2 % PADS 6 each  6 each Topical Daily Murlean Iba, MD   6 each at 01/20/21 0947  . colchicine tablet 0.6 mg  0.6 mg Oral Daily Johnson, Clanford L, MD   0.6 mg at 01/20/21 0945  . divalproex (DEPAKOTE ER) 24 hr tablet 1,000 mg  1,000 mg Oral QHS Johnson, Clanford L, MD   1,000 mg at 01/19/21 2151  . dorzolamide (TRUSOPT) 2 % ophthalmic solution 1 drop  1 drop Left Eye BID Wynetta Emery, Clanford L, MD   1 drop at 01/20/21 0949  . enoxaparin (LOVENOX) injection 40 mg  40 mg Subcutaneous Q24H Emokpae, Ejiroghene E, MD   40 mg at 01/19/21 2152  . gabapentin (NEURONTIN) capsule 400 mg  400 mg Oral QID Johnson, Clanford L, MD   400 mg at 01/20/21 0945  . hydrALAZINE (APRESOLINE) injection 10 mg  10 mg Intravenous Q4H PRN Johnson, Clanford L, MD      . hydrOXYzine (ATARAX/VISTARIL) tablet 25 mg  25 mg Oral TID Wynetta Emery, Clanford L, MD   25 mg at 01/20/21 0945  . insulin aspart (novoLOG) injection 0-5 Units  0-5 Units Subcutaneous QHS Emokpae, Ejiroghene E, MD   2 Units at 01/19/21 2154  . insulin aspart (novoLOG) injection 0-9 Units  0-9 Units Subcutaneous TID WC Emokpae, Ejiroghene E, MD   5 Units at 01/20/21 1228  . insulin glargine (LANTUS) injection 20 Units  20 Units Subcutaneous BID Murlean Iba, MD   20 Units at 01/20/21 0948  . lactulose (CHRONULAC) 10 GM/15ML solution 20 g  20 g Oral BID Wynetta Emery, Clanford L, MD   20 g at 01/20/21 0947  . magnesium sulfate IVPB 2 g 50 mL  2 g Intravenous Once Murlean Iba, MD   Held at 01/18/21 1412  . ondansetron (ZOFRAN) tablet 4 mg  4 mg Oral Q6H PRN Emokpae, Ejiroghene E, MD   4 mg at 01/20/21 0256   Or  . ondansetron (ZOFRAN) injection 4 mg  4 mg Intravenous Q6H PRN Emokpae, Ejiroghene E, MD      . pantoprazole (PROTONIX) injection 40 mg  40 mg Intravenous Q24H Emokpae, Ejiroghene E, MD   40 mg at 01/19/21 2152  . polyethylene glycol (MIRALAX / GLYCOLAX) packet 17  g  17 g Oral Daily PRN Emokpae, Ejiroghene E, MD      . QUEtiapine (SEROQUEL XR) 24 hr tablet 800 mg  800 mg Oral QPM Johnson, Clanford L, MD   800 mg at 01/19/21 1713  . risperiDONE (RISPERDAL M-TABS) disintegrating tablet 3 mg  3 mg Oral BID Wynetta Emery, Clanford L, MD   3 mg at 01/20/21 0946  . sodium chloride flush (NS) 0.9 % injection 10-40 mL  10-40 mL Intracatheter Q12H Johnson, Clanford L,  MD   10 mL at 01/20/21 0949  . sodium chloride flush (NS) 0.9 % injection 10-40 mL  10-40 mL Intracatheter PRN Johnson, Clanford L, MD      . timolol (TIMOPTIC) 0.5 % ophthalmic solution 1 drop  1 drop Left Eye BID Wynetta Emery, Clanford L, MD   1 drop at 01/20/21 0948  . umeclidinium bromide (INCRUSE ELLIPTA) 62.5 MCG/INH 1 puff  1 puff Inhalation Daily Johnson, Clanford L, MD   1 puff at 01/20/21 0807     Discharge Medications: TAKE these medications   acetaminophen 325 MG tablet Commonly known as: TYLENOL Take 650 mg by mouth every 4 (four) hours as needed for mild pain or moderate pain.   acetaZOLAMIDE 500 MG capsule Commonly known as: DIAMOX Take 500 mg by mouth 2 (two) times daily.   amLODipine 5 MG tablet Commonly known as: NORVASC Take 1 tablet (5 mg total) by mouth daily. Start taking on: January 21, 2021   aspirin EC 81 MG tablet Take 81 mg by mouth every morning.   atorvastatin 10 MG tablet Commonly known as: LIPITOR Take 10 mg by mouth every evening.   benztropine 0.5 MG tablet Commonly known as: COGENTIN Take 0.5 mg by mouth 2 (two) times daily.   brimonidine 0.2 % ophthalmic solution Commonly known as: ALPHAGAN Place 1 drop into the left eye every 8 (eight) hours.   cefdinir 300 MG capsule Commonly known as: OMNICEF Take 1 capsule (300 mg total) by mouth 2 (two) times daily.   divalproex 500 MG 24 hr tablet Commonly known as: DEPAKOTE ER Take 1 tablet (500 mg total) by mouth at bedtime. What changed: how much to take   dorzolamide 2 % ophthalmic solution Commonly  known as: TRUSOPT Place 1 drop into the left eye 2 (two) times daily.   furosemide 40 MG tablet Commonly known as: LASIX Take 1 tablet (40 mg total) by mouth daily as needed for fluid or edema.   gabapentin 400 MG capsule Commonly known as: NEURONTIN Take 1 capsule (400 mg total) by mouth 3 (three) times daily. What changed: when to take this   hydrOXYzine 25 MG tablet Commonly known as: ATARAX/VISTARIL Take 25 mg by mouth 3 (three) times daily.   insulin aspart 100 UNIT/ML FlexPen Commonly known as: NOVOLOG Inject 11-16 Units into the skin 3 (three) times daily with meals. SLIDING SCALE: 150-200= 11 units 201-250= 12 units 251-300= 13 units 301-350= 14 units 351-400= 15 units >400=      16 units Call if less than 70 or greater than 300 for 3 tests  In a row   insulin glargine 100 UNIT/ML injection Commonly known as: LANTUS Inject 0.14 mLs (14 Units total) into the skin at bedtime. What changed:   how much to take  when to take this   lactulose 10 GM/15ML solution Commonly known as: CHRONULAC Take 30 mLs (20 g total) by mouth 2 (two) times daily.   levocetirizine 5 MG tablet Commonly known as: XYZAL Take 5 mg by mouth every evening.   metFORMIN 1000 MG tablet Commonly known as: GLUCOPHAGE Take 1,000 mg by mouth 2 (two) times daily with a meal.   Mitigare 0.6 MG Caps Generic drug: Colchicine Take 1 capsule by mouth daily.   polyethylene glycol 17 g packet Commonly known as: MIRALAX / GLYCOLAX Take 17 g by mouth daily.   prednisoLONE acetate 1 % ophthalmic suspension Commonly known as: PRED FORTE Place 1 drop into both eyes 4 (four) times daily.  QUEtiapine 200 MG 24 hr tablet Commonly known as: SEROQUEL XR Take 1 tablet (200 mg total) by mouth every evening. What changed: how much to take   risperidone 3 MG disintegrating tablet Commonly known as: RISPERDAL M-TABS Take 3 mg by mouth 2 (two) times daily.   sennosides-docusate sodium  8.6-50 MG tablet Commonly known as: SENOKOT-S Take 1 tablet by mouth in the morning and at bedtime.   timolol 0.5 % ophthalmic solution Commonly known as: TIMOPTIC Place 1 drop into the left eye 2 (two) times daily.   tiotropium 18 MCG inhalation capsule Commonly known as: SPIRIVA Place 18 mcg into inhaler and inhale daily.   Vitamin D (Ergocalciferol) 1.25 MG (50000 UNIT) Caps capsule Commonly known as: DRISDOL Take 50,000 Units by mouth every 7 (seven) days.    Relevant Imaging Results:  Relevant Lab Results:   Additional Information SS# [id number]  Ihor Gully, LCSW

## 2021-01-20 NOTE — Plan of Care (Addendum)
  Problem: Acute Rehab PT Goals(only PT should resolve) Goal: Patient Will Perform Sitting Balance Outcome: Progressing Flowsheets (Taken 01/20/2021 1112) Patient will perform sitting balance: . with modified independence . 1-2 min Goal: Patient Will Transfer Sit To/From Stand Outcome: Progressing Flowsheets (Taken 01/20/2021 1112) Patient will transfer sit to/from stand: with modified independence Goal: Pt Will Transfer Bed To Chair/Chair To Bed Outcome: Progressing Flowsheets (Taken 01/20/2021 1112) Pt will Transfer Bed to Chair/Chair to Bed: with modified independence Goal: Pt Will Ambulate Outcome: Progressing Flowsheets (Taken 01/20/2021 1112) Pt will Ambulate: . 50 feet . with min guard assist . with rolling walker  11:13 AM, 01/20/21 Sinclair Ship SPT  11:21 AM, 01/20/21 Lonell Grandchild, MPT Physical Therapist with [facility] [phone] office [phone] mobile phone

## 2021-01-20 NOTE — NC FL2 (Deleted)
[place] LEVEL OF CARE SCREENING TOOL     IDENTIFICATION  Patient Name: Regina Watson Birthdate: [birth year] Sex: female Admission Date (Current Location): 01/17/2021  [place] and [place] Number:  [facility] [id number] [name] and Address:  [facility] [address] [place]      Provider Number: [id number]  Attending Physician Name and Address:  Orson Eva, MD  Relative Name and Phone Number:  Heloise Beecham ([place])   [phone]    Current Level of Care: Hospital Recommended Level of Care: Family Care Home Prior Approval Number:    Date Approved/Denied:   PASRR Number:    Discharge Plan: Domiciliary (Rest home)    Current Diagnoses: Patient Active Problem List   Diagnosis Date Noted  . Acute metabolic encephalopathy 85/27/7824  . Uterine mass 01/17/2021  . Altered mental status 10/15/2020  . Leukocytosis 10/15/2020  . AKI (acute kidney injury) ([place]) 10/15/2020  . Hyperglycemia due to diabetes mellitus ([place]) 10/15/2020  . Hyperlipidemia 10/15/2020  . Diabetic neuropathy ([place]) 10/15/2020  . UTI (urinary tract infection) 10/15/2019  . Encephalopathy acute 10/14/2019  . Schizophrenia ([place]) 10/14/2019  . Type 2 diabetes mellitus without complication ([place]) 23/53/6144  . Acute encephalopathy 10/14/2019  . Palpitations 10/04/2016  . Essential hypertension 10/04/2016  . Tobacco use 10/04/2016  . Tachycardia 10/04/2016    Orientation RESPIRATION BLADDER Height & Weight     Self,Situation,Place,Time  Normal Continent Weight: 200 lb (90.7 kg) Height:  5\' 9"  (175.3 cm)  BEHAVIORAL SYMPTOMS/MOOD NEUROLOGICAL BOWEL NUTRITION STATUS      Continent Diet (heart healthy/carb modified)  AMBULATORY STATUS COMMUNICATION OF NEEDS Skin   Supervision Verbally Normal                       Personal Care Assistance Level of Assistance  Bathing,Dressing,Feeding Bathing Assistance: Limited assistance Feeding assistance:  Independent Dressing Assistance: Limited assistance     Functional Limitations Info             SPECIAL CARE FACTORS FREQUENCY  PT (By licensed PT)     PT Frequency: 3x/week              Contractures Contractures Info: Not present    Additional Factors Info  Insulin Sliding Scale Code Status Info: Full Code Allergies Info: Bactrim, Penicillins, Latex Psychotropic Info: Depakote Insulin Sliding Scale Info:  (150-200= 11 units; 201-250=12 units; 251-200=13units; 301-350=14units; 351-400=15units; >400= 16units. Call if less than 70 or greater than 300 for 3 test in a row.)       Current Medications (01/20/2021):  This is the current hospital active medication list Current Facility-Administered Medications  Medication Dose Route Frequency Provider Last Rate Last Admin  . 0.9 % NaCl with KCl 20 mEq/ L  infusion   Intravenous Continuous Wynetta Emery, Clanford L, MD 70 mL/hr at 01/20/21 0944 New Bag at 01/20/21 0944  . acetaminophen (TYLENOL) tablet 650 mg  650 mg Oral Q6H PRN Emokpae, Ejiroghene E, MD   650 mg at 01/19/21 1713   Or  . acetaminophen (TYLENOL) suppository 650 mg  650 mg Rectal Q6H PRN Emokpae, Ejiroghene E, MD      . acetaZOLAMIDE (DIAMOX) 12 hr capsule 500 mg  500 mg Oral BID Wynetta Emery, Clanford L, MD   500 mg at 01/20/21 0946  . amLODipine (NORVASC) tablet 5 mg  5 mg Oral Daily Johnson, Clanford L, MD   5 mg at 01/20/21 0946  . aspirin EC tablet 81 mg  81  mg Oral q morning Johnson, Clanford L, MD   81 mg at 01/20/21 0947  . atorvastatin (LIPITOR) tablet 10 mg  10 mg Oral QPM Johnson, Clanford L, MD   10 mg at 01/19/21 1716  . benztropine (COGENTIN) tablet 0.5 mg  0.5 mg Oral BID Wynetta Emery, Clanford L, MD   0.5 mg at 01/20/21 0946  . brimonidine (ALPHAGAN) 0.2 % ophthalmic solution 1 drop  1 drop Left Eye TID Wynetta Emery, Clanford L, MD   1 drop at 01/20/21 0948  . cefTRIAXone (ROCEPHIN) 1 g in sodium chloride 0.9 % 100 mL IVPB  1 g Intravenous Q24H Tat, David, MD 200 mL/hr at  01/20/21 1230 1 g at 01/20/21 1230  . Chlorhexidine Gluconate Cloth 2 % PADS 6 each  6 each Topical Daily Murlean Iba, MD   6 each at 01/20/21 0947  . colchicine tablet 0.6 mg  0.6 mg Oral Daily Johnson, Clanford L, MD   0.6 mg at 01/20/21 0945  . divalproex (DEPAKOTE ER) 24 hr tablet 1,000 mg  1,000 mg Oral QHS Johnson, Clanford L, MD   1,000 mg at 01/19/21 2151  . dorzolamide (TRUSOPT) 2 % ophthalmic solution 1 drop  1 drop Left Eye BID Wynetta Emery, Clanford L, MD   1 drop at 01/20/21 0949  . enoxaparin (LOVENOX) injection 40 mg  40 mg Subcutaneous Q24H Emokpae, Ejiroghene E, MD   40 mg at 01/19/21 2152  . gabapentin (NEURONTIN) capsule 400 mg  400 mg Oral QID Johnson, Clanford L, MD   400 mg at 01/20/21 0945  . hydrALAZINE (APRESOLINE) injection 10 mg  10 mg Intravenous Q4H PRN Johnson, Clanford L, MD      . hydrOXYzine (ATARAX/VISTARIL) tablet 25 mg  25 mg Oral TID Wynetta Emery, Clanford L, MD   25 mg at 01/20/21 0945  . insulin aspart (novoLOG) injection 0-5 Units  0-5 Units Subcutaneous QHS Emokpae, Ejiroghene E, MD   2 Units at 01/19/21 2154  . insulin aspart (novoLOG) injection 0-9 Units  0-9 Units Subcutaneous TID WC Emokpae, Ejiroghene E, MD   5 Units at 01/20/21 1228  . insulin glargine (LANTUS) injection 20 Units  20 Units Subcutaneous BID Murlean Iba, MD   20 Units at 01/20/21 0948  . lactulose (CHRONULAC) 10 GM/15ML solution 20 g  20 g Oral BID Wynetta Emery, Clanford L, MD   20 g at 01/20/21 0947  . magnesium sulfate IVPB 2 g 50 mL  2 g Intravenous Once Murlean Iba, MD   Held at 01/18/21 1412  . ondansetron (ZOFRAN) tablet 4 mg  4 mg Oral Q6H PRN Emokpae, Ejiroghene E, MD   4 mg at 01/20/21 0256   Or  . ondansetron (ZOFRAN) injection 4 mg  4 mg Intravenous Q6H PRN Emokpae, Ejiroghene E, MD      . pantoprazole (PROTONIX) injection 40 mg  40 mg Intravenous Q24H Emokpae, Ejiroghene E, MD   40 mg at 01/19/21 2152  . polyethylene glycol (MIRALAX / GLYCOLAX) packet 17 g  17 g Oral  Daily PRN Emokpae, Ejiroghene E, MD      . QUEtiapine (SEROQUEL XR) 24 hr tablet 800 mg  800 mg Oral QPM Johnson, Clanford L, MD   800 mg at 01/19/21 1713  . risperiDONE (RISPERDAL M-TABS) disintegrating tablet 3 mg  3 mg Oral BID Wynetta Emery, Clanford L, MD   3 mg at 01/20/21 0946  . sodium chloride flush (NS) 0.9 % injection 10-40 mL  10-40 mL Intracatheter Q12H Johnson, Clanford L,  MD   10 mL at 01/20/21 0949  . sodium chloride flush (NS) 0.9 % injection 10-40 mL  10-40 mL Intracatheter PRN Johnson, Clanford L, MD      . timolol (TIMOPTIC) 0.5 % ophthalmic solution 1 drop  1 drop Left Eye BID Wynetta Emery, Clanford L, MD   1 drop at 01/20/21 0948  . umeclidinium bromide (INCRUSE ELLIPTA) 62.5 MCG/INH 1 puff  1 puff Inhalation Daily Johnson, Clanford L, MD   1 puff at 01/20/21 0807     Discharge Medications: TAKE these medications   acetaminophen 325 MG tablet Commonly known as: TYLENOL Take 650 mg by mouth every 4 (four) hours as needed for mild pain or moderate pain.   acetaZOLAMIDE 500 MG capsule Commonly known as: DIAMOX Take 500 mg by mouth 2 (two) times daily.   amLODipine 5 MG tablet Commonly known as: NORVASC Take 1 tablet (5 mg total) by mouth daily. Start taking on: January 21, 2021   aspirin EC 81 MG tablet Take 81 mg by mouth every morning.   atorvastatin 10 MG tablet Commonly known as: LIPITOR Take 10 mg by mouth every evening.   benztropine 0.5 MG tablet Commonly known as: COGENTIN Take 0.5 mg by mouth 2 (two) times daily.   brimonidine 0.2 % ophthalmic solution Commonly known as: ALPHAGAN Place 1 drop into the left eye every 8 (eight) hours.   cefdinir 300 MG capsule Commonly known as: OMNICEF Take 1 capsule (300 mg total) by mouth 2 (two) times daily.   divalproex 500 MG 24 hr tablet Commonly known as: DEPAKOTE ER Take 1 tablet (500 mg total) by mouth at bedtime. What changed: how much to take   dorzolamide 2 % ophthalmic solution Commonly known as:  TRUSOPT Place 1 drop into the left eye 2 (two) times daily.   furosemide 40 MG tablet Commonly known as: LASIX Take 1 tablet (40 mg total) by mouth daily as needed for fluid or edema.   gabapentin 400 MG capsule Commonly known as: NEURONTIN Take 1 capsule (400 mg total) by mouth 3 (three) times daily. What changed: when to take this   hydrOXYzine 25 MG tablet Commonly known as: ATARAX/VISTARIL Take 25 mg by mouth 3 (three) times daily.   insulin aspart 100 UNIT/ML FlexPen Commonly known as: NOVOLOG Inject 11-16 Units into the skin 3 (three) times daily with meals. SLIDING SCALE: 150-200= 11 units 201-250= 12 units 251-300= 13 units 301-350= 14 units 351-400= 15 units >400=      16 units Call if less than 70 or greater than 300 for 3 tests  In a row   insulin glargine 100 UNIT/ML injection Commonly known as: LANTUS Inject 0.14 mLs (14 Units total) into the skin at bedtime. What changed:   how much to take  when to take this   lactulose 10 GM/15ML solution Commonly known as: CHRONULAC Take 30 mLs (20 g total) by mouth 2 (two) times daily.   levocetirizine 5 MG tablet Commonly known as: XYZAL Take 5 mg by mouth every evening.   metFORMIN 1000 MG tablet Commonly known as: GLUCOPHAGE Take 1,000 mg by mouth 2 (two) times daily with a meal.   Mitigare 0.6 MG Caps Generic drug: Colchicine Take 1 capsule by mouth daily.   polyethylene glycol 17 g packet Commonly known as: MIRALAX / GLYCOLAX Take 17 g by mouth daily.   prednisoLONE acetate 1 % ophthalmic suspension Commonly known as: PRED FORTE Place 1 drop into both eyes 4 (four) times daily.  QUEtiapine 200 MG 24 hr tablet Commonly known as: SEROQUEL XR Take 1 tablet (200 mg total) by mouth every evening. What changed: how much to take   risperidone 3 MG disintegrating tablet Commonly known as: RISPERDAL M-TABS Take 3 mg by mouth 2 (two) times daily.   sennosides-docusate sodium 8.6-50 MG  tablet Commonly known as: SENOKOT-S Take 1 tablet by mouth in the morning and at bedtime.   timolol 0.5 % ophthalmic solution Commonly known as: TIMOPTIC Place 1 drop into the left eye 2 (two) times daily.   tiotropium 18 MCG inhalation capsule Commonly known as: SPIRIVA Place 18 mcg into inhaler and inhale daily.   Vitamin D (Ergocalciferol) 1.25 MG (50000 UNIT) Caps capsule Commonly known as: DRISDOL Take 50,000 Units by mouth every 7 (seven) days   Relevant Imaging Results:  Relevant Lab Results:   Additional Information SS# [id number]  Ihor Gully, LCSW

## 2021-01-22 ENCOUNTER — Other Ambulatory Visit: Payer: Medicaid Other | Admitting: Adult Health

## 2021-01-22 LAB — CULTURE, BLOOD (SINGLE)
Culture: NO GROWTH
Special Requests: ADEQUATE

## 2021-03-08 ENCOUNTER — Ambulatory Visit (INDEPENDENT_AMBULATORY_CARE_PROVIDER_SITE_OTHER): Payer: Medicaid Other | Admitting: Gastroenterology

## 2021-03-08 ENCOUNTER — Encounter (INDEPENDENT_AMBULATORY_CARE_PROVIDER_SITE_OTHER): Payer: Self-pay | Admitting: *Deleted

## 2021-03-08 ENCOUNTER — Encounter (INDEPENDENT_AMBULATORY_CARE_PROVIDER_SITE_OTHER): Payer: Self-pay | Admitting: Gastroenterology

## 2021-04-01 ENCOUNTER — Other Ambulatory Visit (HOSPITAL_COMMUNITY): Payer: Self-pay | Admitting: Internal Medicine

## 2021-04-01 DIAGNOSIS — Z1231 Encounter for screening mammogram for malignant neoplasm of breast: Secondary | ICD-10-CM

## 2021-08-24 ENCOUNTER — Encounter (INDEPENDENT_AMBULATORY_CARE_PROVIDER_SITE_OTHER): Payer: Self-pay | Admitting: *Deleted

## 2021-08-25 ENCOUNTER — Other Ambulatory Visit: Payer: Self-pay

## 2021-08-25 ENCOUNTER — Encounter (HOSPITAL_COMMUNITY): Payer: Self-pay

## 2021-08-25 ENCOUNTER — Emergency Department (HOSPITAL_COMMUNITY)
Admission: EM | Admit: 2021-08-25 | Discharge: 2021-08-28 | Disposition: A | Discharge status: Home / Self Care | Payer: Medicaid Other | Attending: Emergency Medicine | Admitting: Emergency Medicine

## 2021-08-25 DIAGNOSIS — Z794 Long term (current) use of insulin: Secondary | ICD-10-CM | POA: Insufficient documentation

## 2021-08-25 DIAGNOSIS — F29 Unspecified psychosis not due to a substance or known physiological condition: Secondary | ICD-10-CM | POA: Insufficient documentation

## 2021-08-25 DIAGNOSIS — F209 Schizophrenia, unspecified: Secondary | ICD-10-CM | POA: Diagnosis not present

## 2021-08-25 DIAGNOSIS — Z7982 Long term (current) use of aspirin: Secondary | ICD-10-CM | POA: Diagnosis not present

## 2021-08-25 DIAGNOSIS — F4325 Adjustment disorder with mixed disturbance of emotions and conduct: Secondary | ICD-10-CM | POA: Diagnosis present

## 2021-08-25 DIAGNOSIS — E119 Type 2 diabetes mellitus without complications: Secondary | ICD-10-CM | POA: Diagnosis not present

## 2021-08-25 DIAGNOSIS — Z91040 Latex allergy status: Secondary | ICD-10-CM | POA: Diagnosis not present

## 2021-08-25 DIAGNOSIS — I10 Essential (primary) hypertension: Secondary | ICD-10-CM | POA: Diagnosis not present

## 2021-08-25 DIAGNOSIS — Z79899 Other long term (current) drug therapy: Secondary | ICD-10-CM | POA: Diagnosis not present

## 2021-08-25 DIAGNOSIS — F419 Anxiety disorder, unspecified: Secondary | ICD-10-CM | POA: Diagnosis present

## 2021-08-25 DIAGNOSIS — Z7984 Long term (current) use of oral hypoglycemic drugs: Secondary | ICD-10-CM | POA: Diagnosis not present

## 2021-08-25 DIAGNOSIS — F203 Undifferentiated schizophrenia: Secondary | ICD-10-CM | POA: Diagnosis not present

## 2021-08-25 LAB — COMPREHENSIVE METABOLIC PANEL
ALT: 52 U/L — ABNORMAL HIGH (ref 0–44)
AST: 37 U/L (ref 15–41)
Albumin: 4 g/dL (ref 3.5–5.0)
Alkaline Phosphatase: 83 U/L (ref 38–126)
Anion gap: 8 (ref 5–15)
BUN: 12 mg/dL (ref 6–20)
CO2: 26 mmol/L (ref 22–32)
Calcium: 9.6 mg/dL (ref 8.9–10.3)
Chloride: 103 mmol/L (ref 98–111)
Creatinine, Ser: 0.51 mg/dL (ref 0.44–1.00)
GFR, Estimated: >60 mL/min (ref >60)
Glucose, Bld: 129 mg/dL — ABNORMAL HIGH (ref 70–99)
Potassium: 3.9 mmol/L (ref 3.5–5.1)
Sodium: 137 mmol/L (ref 135–145)
Total Bilirubin: 0.5 mg/dL (ref 0.3–1.2)
Total Protein: 7.7 g/dL (ref 6.5–8.1)

## 2021-08-25 LAB — CBC WITH DIFFERENTIAL/PLATELET
Abs Immature Granulocytes: 0.04 10*3/uL (ref 0.00–0.07)
Basophils Absolute: 0 10*3/uL (ref 0.0–0.1)
Basophils Relative: 1 %
Eosinophils Absolute: 0.1 10*3/uL (ref 0.0–0.5)
Eosinophils Relative: 2 %
HCT: 43.2 % (ref 36.0–46.0)
Hemoglobin: 13.8 g/dL (ref 12.0–15.0)
Immature Granulocytes: 1 %
Lymphocytes Relative: 35 %
Lymphs Abs: 1.9 10*3/uL (ref 0.7–4.0)
MCH: 30.6 pg (ref 26.0–34.0)
MCHC: 31.9 g/dL (ref 30.0–36.0)
MCV: 95.8 fL (ref 80.0–100.0)
Monocytes Absolute: 0.4 10*3/uL (ref 0.1–1.0)
Monocytes Relative: 7 %
Neutro Abs: 3 10*3/uL (ref 1.7–7.7)
Neutrophils Relative %: 54 %
Platelets: 227 10*3/uL (ref 150–400)
RBC: 4.51 MIL/uL (ref 3.87–5.11)
RDW: 13.7 % (ref 11.5–15.5)
WBC: 5.5 10*3/uL (ref 4.0–10.5)
nRBC: 0 % (ref 0.0–0.2)

## 2021-08-25 LAB — URINALYSIS, ROUTINE W REFLEX MICROSCOPIC
Bilirubin Urine: NEGATIVE
Glucose, UA: >=500 mg/dL — AB
Ketones, ur: 5 mg/dL — AB
Leukocytes,Ua: NEGATIVE
Nitrite: NEGATIVE
Protein, ur: NEGATIVE mg/dL
Specific Gravity, Urine: 1.017 (ref 1.005–1.030)
pH: 6 (ref 5.0–8.0)

## 2021-08-25 LAB — SALICYLATE LEVEL: Salicylate Lvl: <7 mg/dL — ABNORMAL LOW (ref 7.0–30.0)

## 2021-08-25 LAB — RAPID URINE DRUG SCREEN, HOSP PERFORMED
Amphetamines: NOT DETECTED
Barbiturates: NOT DETECTED
Benzodiazepines: NOT DETECTED
Cocaine: NOT DETECTED
Opiates: NOT DETECTED
Tetrahydrocannabinol: NOT DETECTED

## 2021-08-25 LAB — VALPROIC ACID LEVEL: Valproic Acid Lvl: 45 ug/mL — ABNORMAL LOW (ref 50.0–100.0)

## 2021-08-25 LAB — AMMONIA: Ammonia: 32 umol/L (ref 9–35)

## 2021-08-25 LAB — ETHANOL: Alcohol, Ethyl (B): <10 mg/dL (ref <10)

## 2021-08-25 LAB — ACETAMINOPHEN LEVEL: Acetaminophen (Tylenol), Serum: <10 ug/mL — ABNORMAL LOW (ref 10–30)

## 2021-08-25 MED ORDER — DORZOLAMIDE HCL 2 % OP SOLN
1.0000 [drp] | Freq: Two times a day (BID) | OPHTHALMIC | Status: DC
  Administered 2021-08-26 – 2021-08-28 (×4): 1 [drp] via OPHTHALMIC
  Filled 2021-08-25: qty 10

## 2021-08-25 MED ORDER — COLCHICINE 0.6 MG PO TABS
0.6000 mg | ORAL_TABLET | Freq: Every day | ORAL | Status: DC
  Administered 2021-08-25 – 2021-08-28 (×4): 0.6 mg via ORAL
  Filled 2021-08-25 (×4): qty 1

## 2021-08-25 MED ORDER — DIVALPROEX SODIUM ER 500 MG PO TB24
1000.0000 mg | ORAL_TABLET | Freq: Every day | ORAL | Status: DC
  Administered 2021-08-25 – 2021-08-27 (×3): 1000 mg via ORAL
  Filled 2021-08-25 (×3): qty 2

## 2021-08-25 MED ORDER — HYDROXYZINE HCL 25 MG PO TABS
25.0000 mg | ORAL_TABLET | Freq: Three times a day (TID) | ORAL | Status: DC
  Administered 2021-08-25 – 2021-08-28 (×9): 25 mg via ORAL
  Filled 2021-08-25 (×10): qty 1

## 2021-08-25 MED ORDER — INSULIN GLARGINE-YFGN 100 UNIT/ML ~~LOC~~ SOLN
30.0000 [IU] | Freq: Two times a day (BID) | SUBCUTANEOUS | Status: DC
  Administered 2021-08-25 – 2021-08-28 (×6): 30 [IU] via SUBCUTANEOUS
  Filled 2021-08-25 (×10): qty 0.3

## 2021-08-25 MED ORDER — METFORMIN HCL 500 MG PO TABS
1000.0000 mg | ORAL_TABLET | Freq: Two times a day (BID) | ORAL | Status: DC
  Administered 2021-08-26 – 2021-08-28 (×6): 1000 mg via ORAL
  Filled 2021-08-25 (×6): qty 2

## 2021-08-25 MED ORDER — ATORVASTATIN CALCIUM 10 MG PO TABS
10.0000 mg | ORAL_TABLET | Freq: Every evening | ORAL | Status: DC
  Administered 2021-08-25 – 2021-08-28 (×4): 10 mg via ORAL
  Filled 2021-08-25 (×4): qty 1

## 2021-08-25 MED ORDER — LEVOCETIRIZINE DIHYDROCHLORIDE 5 MG PO TABS: 5.0000 mg | ORAL_TABLET | Freq: Every evening | ORAL | Status: DC

## 2021-08-25 MED ORDER — AMLODIPINE BESYLATE 5 MG PO TABS
5.0000 mg | ORAL_TABLET | Freq: Every day | ORAL | Status: DC
Start: 2021-08-26 — End: 2021-08-29
  Administered 2021-08-26 – 2021-08-28 (×3): 5 mg via ORAL
  Filled 2021-08-25 (×4): qty 1

## 2021-08-25 MED ORDER — TIOTROPIUM BROMIDE MONOHYDRATE 18 MCG IN CAPS: 18.0000 ug | ORAL_CAPSULE | Freq: Every day | RESPIRATORY_TRACT | Status: DC

## 2021-08-25 MED ORDER — UMECLIDINIUM BROMIDE 62.5 MCG/ACT IN AEPB
1.0000 | INHALATION_SPRAY | Freq: Every day | RESPIRATORY_TRACT | Status: DC
  Administered 2021-08-26 – 2021-08-28 (×3): 1 via RESPIRATORY_TRACT
  Filled 2021-08-25 (×2): qty 7

## 2021-08-25 MED ORDER — ASPIRIN EC 81 MG PO TBEC
81.0000 mg | DELAYED_RELEASE_TABLET | Freq: Every morning | ORAL | Status: DC
Start: 2021-08-26 — End: 2021-08-29
  Administered 2021-08-26 – 2021-08-28 (×3): 81 mg via ORAL
  Filled 2021-08-25 (×3): qty 1

## 2021-08-25 MED ORDER — BRIMONIDINE TARTRATE 0.2 % OP SOLN
1.0000 [drp] | Freq: Three times a day (TID) | OPHTHALMIC | Status: DC
  Administered 2021-08-26 – 2021-08-28 (×6): 1 [drp] via OPHTHALMIC
  Filled 2021-08-25 (×2): qty 5

## 2021-08-25 MED ORDER — TIMOLOL MALEATE 0.5 % OP SOLN
1.0000 [drp] | Freq: Two times a day (BID) | OPHTHALMIC | Status: DC
  Administered 2021-08-26 – 2021-08-28 (×4): 1 [drp] via OPHTHALMIC
  Filled 2021-08-25 (×2): qty 5

## 2021-08-25 MED ORDER — ACETAZOLAMIDE ER 500 MG PO CP12
500.0000 mg | ORAL_CAPSULE | Freq: Two times a day (BID) | ORAL | Status: DC
  Administered 2021-08-25 – 2021-08-28 (×6): 500 mg via ORAL
  Filled 2021-08-25 (×11): qty 1

## 2021-08-25 MED ORDER — BENZTROPINE MESYLATE 1 MG PO TABS
0.5000 mg | ORAL_TABLET | Freq: Two times a day (BID) | ORAL | Status: DC
  Administered 2021-08-25 – 2021-08-28 (×6): 0.5 mg via ORAL
  Filled 2021-08-25 (×6): qty 1

## 2021-08-25 MED ORDER — RISPERIDONE 1 MG PO TBDP
3.0000 mg | ORAL_TABLET | Freq: Two times a day (BID) | ORAL | Status: DC
  Administered 2021-08-25 – 2021-08-28 (×6): 3 mg via ORAL
  Filled 2021-08-25 (×5): qty 3

## 2021-08-25 MED ORDER — LACTULOSE 10 GM/15ML PO SOLN
20.0000 g | Freq: Two times a day (BID) | ORAL | Status: DC
  Administered 2021-08-25 – 2021-08-28 (×6): 20 g via ORAL
  Filled 2021-08-25 (×6): qty 30

## 2021-08-25 MED ORDER — LORATADINE 10 MG PO TABS
10.0000 mg | ORAL_TABLET | Freq: Every day | ORAL | Status: DC
  Administered 2021-08-25 – 2021-08-28 (×4): 10 mg via ORAL
  Filled 2021-08-25 (×4): qty 1

## 2021-08-25 MED ORDER — DAPAGLIFLOZIN PROPANEDIOL 5 MG PO TABS
10.0000 mg | ORAL_TABLET | Freq: Every day | ORAL | Status: DC
  Administered 2021-08-25 – 2021-08-28 (×4): 10 mg via ORAL
  Filled 2021-08-25 (×3): qty 2
  Filled 2021-08-25 (×3): qty 1

## 2021-08-25 MED ORDER — PREDNISOLONE ACETATE 1 % OP SUSP
1.0000 [drp] | Freq: Four times a day (QID) | OPHTHALMIC | Status: DC
  Administered 2021-08-25 – 2021-08-28 (×9): 1 [drp] via OPHTHALMIC
  Filled 2021-08-25: qty 5
  Filled 2021-08-25 (×2): qty 1

## 2021-08-25 MED ORDER — GABAPENTIN 400 MG PO CAPS
400.0000 mg | ORAL_CAPSULE | Freq: Four times a day (QID) | ORAL | Status: DC
  Administered 2021-08-25 – 2021-08-28 (×12): 400 mg via ORAL
  Filled 2021-08-25 (×12): qty 1

## 2021-08-25 MED ORDER — QUETIAPINE FUMARATE ER 400 MG PO TB24
800.0000 mg | ORAL_TABLET | Freq: Every evening | ORAL | Status: DC
  Administered 2021-08-25 – 2021-08-28 (×4): 800 mg via ORAL
  Filled 2021-08-25 (×2): qty 2
  Filled 2021-08-25 (×4): qty 4
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
## 2021-08-25 NOTE — BH Assessment (Signed)
@  1805, requested patient's nurse Erasmo Downer, RN) to set up the TTS machine for patient.

## 2021-08-25 NOTE — BH Assessment (Addendum)
Clinician informed by the Disposition Social Worker that patient has a legal guardian. Therefore, Clinician completed a chart review to seek contact information for patient's guardian. The listed guardianship information was not clear and noted from 2018.   Therefore, I contacted the care giver "Albina Billet" @ the current residence: [address], [place], [place]  [address], Phone: [phone]. She states that "Nevin Bloodgood" is her legal guardian. I asked for the last name ad relationship of "Nevin Bloodgood" to patient.  However, "Albina Billet" did not know this information. Therefore, I was provided with the contact information for "Nevin Bloodgood" [phone]. I called the number and it says that "Nevin Bloodgood" is out on vacation until Monday...Marland KitchenMarland KitchenThe answering service also provided emergency numbers: [phone] and [phone].  Clinician later found that the number called to reach patient's legal guardian is associated, [facility] [employer] [facility] [place]. Address: [address] [place], [place] [address] Phone: [phone]. [employer] [facility] provides direct supports and services to people with intellectual and developmental disabilities (I/DD) that enable people to lead full rich lives in the community.  Clinician called the emergency numbers listed for [facility] [place] [phone] and [phone] and spoke to Arlyss Gandy (Area Guardian Specialist). She is on call tonight and verbally confirmed that patient's guardian is Heloise Beecham [phone]. I have requested a copy of the guardianship paperwork. Also, requested a fax # at [facility] to have this information faxed directly to [facility] and placed in patient's chart for medical record purposes.  Per Arlyss Gandy, her request is to keep patient in the Emergency Department regardless of what DSS determines. States that she does not feel comfortable with patient discharging back to the facility until further information is discussed with legal guardian.  The legal guardian will not return back to the office until after 8am, 08/26/2021. I informed Arlyss Gandy that I would past her request along, however to the appropriate persons (patient's nurse, Social Work, EDP).   Clinician provided updates to the Disposition Social Worker, patient's nurse, and EDP.

## 2021-08-25 NOTE — ED Triage Notes (Signed)
Pt via EMS from [facility] due to anxiety. Patient had argument with staff this morning and is not comfortable staying there any longer. She wants to be placed in a different facility.

## 2021-08-25 NOTE — ED Notes (Signed)
Assisted pt to restroom.  Pt ambulated with standby assistance.  UA collected.

## 2021-08-25 NOTE — Progress Notes (Addendum)
This CSW spent time speaking with APS supervisor Veto Kemps with [place], [phone] who advised that pt's [place] is located in [place] and that [place] would need to make a report through that county. CSW called the [facility] department to make an after  hour report [phone]. CSW is awaiting a phone call back. APS supervisor Veto Kemps advised that pt's gauridan needed to be contacted immediately. CSW advised that [employer], Waldon Merl is currently speaking with [facility] [place].  [facility] Address: [address], [place], [place] [address]. Phone: [phone]. [facility] provides direct supports and services to people with intellectual and developmental disabilities (I/DD) that enable people to lead full rich lives in the community. APS Supervisor Bobette Mo reported that law enforcement needs to be called and to complete APS report with [facility] but to wait until APS worker from [place] calls this [place] back. CSW was also provided with Ezekiel Ina [phone] with the [facility]. Currently, CSW is still waiting on a phone call back from [place] after hour Education officer, museum.   CSW provided update via secure chat to care team:Jessica Eligah East, RN, Waldon Merl, Counselor, Sanda Linger, RN, and Isla Pence, MD.  Benjaman Kindler, MSW, [facility] 08/25/2021 11:20 PM

## 2021-08-25 NOTE — ED Notes (Signed)
Patient placed in family room for TTS.

## 2021-08-25 NOTE — ED Notes (Signed)
Patient changed into scrubs.  Clothing placed in bags and placed in locker room with labels.
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
## 2021-08-25 NOTE — Progress Notes (Signed)
@  9:41pm this CSW is awaiting on [place] after hours [phone] Social Worker to call back to complete APS report. This CSW will assist and follow.   Benjaman Kindler, MSW, [facility] 08/25/2021 9:57 PM
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
## 2021-08-26 MED ORDER — DIVALPROEX SODIUM 250 MG PO DR TAB
250.0000 mg | DELAYED_RELEASE_TABLET | Freq: Once | ORAL | Status: AC
  Administered 2021-08-26: 250 mg via ORAL
  Filled 2021-08-26: qty 1

## 2021-08-26 NOTE — ED Notes (Signed)
Pt is in the bathroom washing off. Linen changed. Pt in clean set of burgundy scrubs

## 2021-08-26 NOTE — ED Notes (Signed)
Pt ambulated to the bathroom unassisted.  

## 2021-08-26 NOTE — ED Notes (Signed)
Gave pt saltine crackers and cranberry juice

## 2021-08-26 NOTE — Progress Notes (Signed)
CM spoke with Tiffany Hanks at [facility] and confirmed that patient can be seen there for follow-up.  Tiffany reports no additional information is needed at this time and that patient can be brought in by the group home as a walk in after being released from the emergency room and she will be seen and set up with a provider.  Per Tiffany if patient is released from the hospital after 3 pm, she will need to wait until the next day to be seen as a walk in.
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
## 2021-08-26 NOTE — ED Provider Notes (Signed)
Emergency Medicine Observation Re-evaluation Note  Halana Deisher is a 56 y.o. female, seen on rounds today.  Pt initially presented to the ED for complaints of Anxiety and Psychiatric Evaluation Currently, the patient is sleeping.  Physical Exam  BP (!) 137/92    Pulse (!) 109    Temp 97.8 F (36.6 C) (Oral)    Resp 18    Ht 5\' 9"  (1.753 m)    Wt 90.7 kg    SpO2 94%    BMI 29.53 kg/m  Physical Exam General: No acute distress Cardiac: Well perfused Lungs: Nonlabored Psych: Cooperative  ED Course / MDM  EKG:   I have reviewed the labs performed to date as well as medications administered while in observation.  Recent changes in the last 24 hours include psychiatry clearance..  Plan  Current plan is for social work is working to reach to patient's legal guardian regarding return to facility or placement in another facility.Rennie Plowman is not under involuntary commitment.     Hayden Rasmussen, MD 08/26/21 [phone]

## 2021-08-26 NOTE — Progress Notes (Addendum)
Addendum This CSW received a phone call back from [place] worker with [facility] [phone] to advise that this APS case has been accepted by [employer]. Regina Watson's supervisor requested the following information: how did pt get to the ED? How was pt transported...who brought pt to [facility] on 08/25/21. CSW sent a secure chat to nursing to inquire. Nurse Regina Favre, RN who is present in chat. CSW is awaiting response.    [place] Completed This CSW received a phone call from [place]. CSW completed APS report and requested follow-up via phone and outcome of report if accepted via mail. APS worker Barnett Applebaum informed this CSW that she would need to staff this case with her supervisor and would follow-up in the morning. Recommendation from APS is to keep pt as observation until follow-up call in the morning. CSW informed that 1st shift will be present at 8am and can accept follow-up phone call. CSW team will assist and follow pt at this time.   However, this CSW believes that a TOC consult will need to be added since this pt is psych cleared per Earleen Newport, NP. TOC will met this pt's discharge needs because the pt does not meet criteria for inpatient psychiatric services at this time.   Pt has a Scientist, research (physical sciences) Guardian who was updated Per Waldon Merl, Counselor-Clinician called the emergency numbers listed for [facility] [place] [facility] [place] [phone] and [phone] and spoke to Regina Watson (Area Guardian Specialist). She is on call tonight and verbally confirmed that patient's guardian is Regina Watson [phone]. I have requested a copy of the guardianship paperwork. Also, requested a fax # at [place] to have this information faxed directly to [employer] and placed in patient's chart for medical record purposes.  Regina Watson, Counselor spoke with Regina Watson, her request is to keep patient in the Emergency Department regardless of what DSS determines.  States that she does not feel comfortable with patient discharging back to the facility until further information is discussed with legal guardian. The legal guardian will not return back to the office until after 8am, 08/26/2021. I informed Regina Watson that I would past her request along, however to the appropriate persons (patient's nurse, Social Work, EDP).    1st shift to follow and discuss pt needing a TOC consult. This CSW will leave notes on shift report.  Benjaman Kindler, MSW, [facility] 08/26/2021 12:39 AM

## 2021-08-26 NOTE — ED Notes (Signed)
Lilia Pro RN made aware to return a call to [phone].

## 2021-08-26 NOTE — Progress Notes (Signed)
Spoke with LG to update on plan to administer an additional Depakote dose.   LG plans to outreach to [facility] APS to follow up on status of the report, we will need APS clearance prior to patient being able to return to her group home.  LG states she has also reached out to [facility] for outpatient follow- up and [facility] requests additional information to be able to schedule this.  CM will reach out to [facility] to clarify information needed.

## 2021-08-26 NOTE — ED Notes (Signed)
Pt in the family room speaking with Corene Cornea from [place], [place].

## 2021-08-26 NOTE — ED Notes (Signed)
Spoke to [place], the [facility] on call for tonight, and she informed me that we cannot discharge pt until APS further investigates pt claims about living facility.

## 2021-08-26 NOTE — ED Notes (Signed)
CSW spoke with pts legal guardian Ova Freshwater who states that she does feel like pt will be able to go back to her group home. However, she feels that she is not at her baseline and needs a medication change. She also requests that it is confirmed that pts psychotropic medications are within therapeutic range.

## 2021-08-26 NOTE — ED Notes (Signed)
Pt up to ambulate to bathroom with out assistance

## 2021-08-26 NOTE — Progress Notes (Addendum)
CSW called Regina Watson ED; Nicole Kindred [phone]  CSW called [place] Na Charge RN and spoke with Nicole Kindred and provided update. CSW was informed by Nicole Kindred that pt was brought to ED by [facility]. CSW advised that CSW believed that [facility] consult needs implemented to assist with pt's discharge needs. Nicole Kindred informed this CSW that should would enter a TOC consult.     Benjaman Kindler, MSW, LCSWA 08/26/2021 1:11 AM

## 2021-08-26 NOTE — ED Notes (Signed)
Spoke with Heloise Beecham, pt.'s legal guardian, about not receiving fax concerning guardianship this afternoon. I gave her the [place] fax number and  she informed me she would resend it. She states she faxed it this morning; However, never received the fax

## 2021-08-27 LAB — CBG MONITORING, ED
Glucose-Capillary: 124 mg/dL — ABNORMAL HIGH (ref 70–99)
Glucose-Capillary: 103 mg/dL — ABNORMAL HIGH (ref 70–99)
Glucose-Capillary: 128 mg/dL — ABNORMAL HIGH (ref 70–99)

## 2021-08-27 MED ORDER — ACETAMINOPHEN 325 MG PO TABS
650.0000 mg | ORAL_TABLET | Freq: Four times a day (QID) | ORAL | Status: DC | PRN
  Administered 2021-08-27: 650 mg via ORAL
  Filled 2021-08-27: qty 2

## 2021-08-27 NOTE — ED Notes (Signed)
Pt's [facility] staff member came to pick pt up for discharge. Pt is refusing to go back with her. Pt becoming very loud and agitated about being told she has to go. Pt will not physically get up to go with [facility] staff member. [facility] staff member reports she doesn't feel comfortable taking pt back considering how agitated she is and her refusing. [facility] staff member called her supervisor who reported pt's legal guardian is coming to pick up pt for discharge.

## 2021-08-27 NOTE — Progress Notes (Signed)
TOC CSW contacted [facility] APS and requested a return call from pt's assigned APS worker. TOC continue to follow.   Arlie Solomons.Cheron Coryell, MSW, [place]   Transitions of Care Clinical Social Worker I Direct Dial: [phone]   Fax: [phone] [email or web address]

## 2021-08-27 NOTE — Progress Notes (Signed)
TOC CSW spoke with both LG Heloise Beecham and APS supervisor Lodema Pilot via conference call to discuss pt's d/c plans. APS did clear pt for d/c, however, LG is asking for APS to visit the group home before pt is d/c back. APS supervisor [facility]. [facility] stated she will go herself and see the [facility] and report back to pt's LG. CSW reminded pt's LG, pt's medications cannot be changed in ED. CSW reminded pt's LG [facility] walk-in time ends at 3 pm today if they are wanting medication changes. LG stated she will call CSW back when the APS supervisor visits home. CSW continue to follow.   Arlie Solomons.Barbee Mamula, MSW, [place]   Transitions of Care Clinical Social Worker I Direct Dial: [phone]   Fax: [phone] [email or web address]

## 2021-08-27 NOTE — Progress Notes (Signed)
CSW spoke with pt's LG  Heloise Beecham [phone] to inquire about pt's d/c plan. She stated she has not spoken to APS this morning. She stated APS spoke with pt yesterday and will see the [facility] today. Pt's LG voiced her concerns with pt being cleared for d/c and still hallucinating. She stated pt is not at her based line and she is requesting pt's Psych Eval. CSW informed pt's LG she will need to request it from medical records. Pt's LG is requesting medication adjustment. CSW reminder LG pt can be seen as a walk-in at [facility] to help with medication management. CSW to call [facility] APS to inquire about pt's D/C.   Arlie Solomons.Mackensey Bolte, MSW, [place]   Transitions of Care Clinical Social Worker I Direct Dial: [phone]   Fax: [phone] [email or web address]

## 2021-08-27 NOTE — Progress Notes (Signed)
Pt refused to leave with [facility] staff, [facility] staff stated they did not feel comfortable providing transport for pt back to [facility]. [facility] Manger Jennette Kettle stated, " she can come back but not in that condition".   CSW contacted pt's LG Heloise Beecham she stated pt needs to be reevaluated,  she stated pt will not speak with her or listen to anyone. LG is requesting for pt to be re-evaluated.  CSW contact [employer] to assist with transporting pt back to [facility], they stated pt's resides in [place], and they cannot cross [employer]. Pt's RN informed CSW, and off duty LE officer stated there is nothing LE can do to assist pt.    CSW made MD aware of LG's request for Psych re-evaluate to stabilize pt . TOC continues to follow.   Arlie Solomons.Mateya Torti, MSW, [place]   Transitions of Care Clinical Social Worker I Direct Dial: [phone]   Fax: [phone] [email or web address]

## 2021-08-27 NOTE — ED Provider Notes (Signed)
Emergency Medicine Observation Re-evaluation Note  Regina Watson is a 56 y.o. female, seen on rounds today.  Pt initially presented to the ED for complaints of Anxiety and Psychiatric Evaluation Currently, the patient is resting.  Physical Exam  BP 105/63 (BP Location: Right Arm)    Pulse 99    Temp 98.6 F (37 C) (Oral)    Resp 12    Ht 5\' 9"  (1.753 m)    Wt 90.7 kg    SpO2 99%    BMI 29.53 kg/m  Physical Exam General: NAD Cardiac: Regular rate Lungs: No respiratory distress Psych: Stable  ED Course / MDM  EKG:   I have reviewed the labs performed to date as well as medications administered while in observation.   Plan  Current plan is for group home placement  Per SW/TOC evaluation notes yesterday, awaiting APS clearance after report was filed.  " LG plans to outreach to [place] to follow up on status of the report, we will need APS clearance prior to patient being able to return to her group home."   Regina Watson is not under involuntary commitment.    Regina Dusky, MD 08/27/21 [phone]

## 2021-08-27 NOTE — ED Provider Notes (Signed)
Patient cleared for discharge, group home present for pickup - see SW note regarding discharge, okay with APS services   Jep Dyas, Carola Rhine, MD 08/27/21 1415

## 2021-08-27 NOTE — Progress Notes (Signed)
CSW called LG Heloise Beecham to inquire about pt's d/c. She stated someone is coming to pick pt up from [facility]. She did not know who. She provided the [facility] manager's name and number. CSW called Jennette Kettle [phone]), he stated the [facility] staff Ms. Janeice Robinson will be picking pt up upon d/c.   Arlie Solomons.Euphemia Lingerfelt, MSW, [place]   Transitions of Care Clinical Social Worker I Direct Dial: [phone]   Fax: [phone] [email or web address]

## 2021-08-28 DIAGNOSIS — F203 Undifferentiated schizophrenia: Secondary | ICD-10-CM

## 2021-08-28 DIAGNOSIS — F4325 Adjustment disorder with mixed disturbance of emotions and conduct: Secondary | ICD-10-CM | POA: Diagnosis present

## 2021-08-28 LAB — CBG MONITORING, ED: Glucose-Capillary: 115 mg/dL — ABNORMAL HIGH (ref 70–99)

## 2021-08-28 NOTE — ED Notes (Addendum)
Pt ambulated to restroom without difficulties. Pt brushed teeth and washed face. Pt back in bed.

## 2021-08-28 NOTE — ED Notes (Signed)
Pt walked to restroom and back to bed with minimal assistance.

## 2021-08-28 NOTE — ED Notes (Signed)
Pt belongings sent with Pt at d/c back to [facility].

## 2021-08-28 NOTE — TOC Progression Note (Signed)
Transition of Care [facility]) - Progression Note    Patient Details  Name: Kortne All MRN: [id number] Date of Birth: 05-12-1966  Transition of Care [facility]) CM/SW Pierz, RN Phone Number: 08/28/2021, 3:27 PM  Clinical Narrative: Attempted to call legal guardian Heloise Beecham [phone], no answer therefore called Doren Custard Guardian specialist [phone], who was informed that patient had a Psych. Evaluation clearance and is medically stable for discharge back to group home, Ms Phillip Heal consents with patient being transported to group home by the [facility] department who will meet with the [facility]. Who will transport patient to prior group home. Tim RN notified and will make arrangements.         Expected Discharge Plan and Services                                                 Social Determinants of Health (SDOH) Interventions    Readmission Risk Interventions Readmission Risk Prevention Plan 10/16/2020  Transportation Screening Complete  Home Care Screening Complete  Medication Review (RN CM) Complete  Some recent data might be hidden
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
## 2021-08-28 NOTE — ED Notes (Signed)
This RN spoke with Evette Doffing Night at [phone] who works with Ms Phillip Heal. He is made aware Pt is cleared for d/c back to [facility] and is aware multiple attempts have been made by this RN to contact someone at the [facility] and the Pt's [facility] without success. Someone answered the phone at the [facility] when called from private number, but then terminated the phone call before report could be given. [facility] will be contacted to provide transport for Pt back to [facility].

## 2021-09-01 ENCOUNTER — Encounter (HOSPITAL_COMMUNITY): Payer: Self-pay | Admitting: Emergency Medicine

## 2021-09-01 ENCOUNTER — Other Ambulatory Visit: Payer: Self-pay

## 2021-09-01 ENCOUNTER — Emergency Department (HOSPITAL_COMMUNITY)
Admission: EM | Admit: 2021-09-01 | Discharge: 2021-09-01 | Disposition: A | Discharge status: Home / Self Care | Payer: Medicaid Other | Attending: Emergency Medicine | Admitting: Emergency Medicine

## 2021-09-01 DIAGNOSIS — F17210 Nicotine dependence, cigarettes, uncomplicated: Secondary | ICD-10-CM | POA: Insufficient documentation

## 2021-09-01 DIAGNOSIS — Z794 Long term (current) use of insulin: Secondary | ICD-10-CM | POA: Insufficient documentation

## 2021-09-01 DIAGNOSIS — E1142 Type 2 diabetes mellitus with diabetic polyneuropathy: Secondary | ICD-10-CM | POA: Insufficient documentation

## 2021-09-01 DIAGNOSIS — I10 Essential (primary) hypertension: Secondary | ICD-10-CM | POA: Diagnosis not present

## 2021-09-01 DIAGNOSIS — E162 Hypoglycemia, unspecified: Secondary | ICD-10-CM

## 2021-09-01 DIAGNOSIS — E11649 Type 2 diabetes mellitus with hypoglycemia without coma: Secondary | ICD-10-CM | POA: Insufficient documentation

## 2021-09-01 LAB — COMPREHENSIVE METABOLIC PANEL
ALT: 60 U/L — ABNORMAL HIGH (ref 0–44)
AST: 41 U/L (ref 15–41)
Albumin: 4 g/dL (ref 3.5–5.0)
Alkaline Phosphatase: 91 U/L (ref 38–126)
Anion gap: 10 (ref 5–15)
BUN: 28 mg/dL — ABNORMAL HIGH (ref 6–20)
CO2: 22 mmol/L (ref 22–32)
Calcium: 9.2 mg/dL (ref 8.9–10.3)
Chloride: 107 mmol/L (ref 98–111)
Creatinine, Ser: 0.61 mg/dL (ref 0.44–1.00)
GFR, Estimated: >60 mL/min (ref >60)
Glucose, Bld: 109 mg/dL — ABNORMAL HIGH (ref 70–99)
Potassium: 3.4 mmol/L — ABNORMAL LOW (ref 3.5–5.1)
Sodium: 139 mmol/L (ref 135–145)
Total Bilirubin: <0.1 mg/dL — ABNORMAL LOW (ref 0.3–1.2)
Total Protein: 7.8 g/dL (ref 6.5–8.1)

## 2021-09-01 LAB — URINALYSIS, ROUTINE W REFLEX MICROSCOPIC
Bilirubin Urine: NEGATIVE
Glucose, UA: >=500 mg/dL — AB
Hgb urine dipstick: NEGATIVE
Ketones, ur: 20 mg/dL — AB
Nitrite: NEGATIVE
Protein, ur: NEGATIVE mg/dL
Specific Gravity, Urine: 1.026 (ref 1.005–1.030)
pH: 5 (ref 5.0–8.0)

## 2021-09-01 LAB — CBC
HCT: 44 % (ref 36.0–46.0)
Hemoglobin: 14.2 g/dL (ref 12.0–15.0)
MCH: 30.4 pg (ref 26.0–34.0)
MCHC: 32.3 g/dL (ref 30.0–36.0)
MCV: 94.2 fL (ref 80.0–100.0)
Platelets: 240 10*3/uL (ref 150–400)
RBC: 4.67 MIL/uL (ref 3.87–5.11)
RDW: 13.6 % (ref 11.5–15.5)
WBC: 8.8 10*3/uL (ref 4.0–10.5)
nRBC: 0 % (ref 0.0–0.2)

## 2021-09-01 LAB — CBG MONITORING, ED
Glucose-Capillary: 121 mg/dL — ABNORMAL HIGH (ref 70–99)
Glucose-Capillary: 88 mg/dL (ref 70–99)
Glucose-Capillary: 90 mg/dL (ref 70–99)

## 2021-09-01 MED ORDER — ACETAMINOPHEN 500 MG PO TABS
1000.0000 mg | ORAL_TABLET | Freq: Once | ORAL | Status: AC
  Administered 2021-09-01: 1000 mg via ORAL
  Filled 2021-09-01: qty 2

## 2021-09-01 NOTE — Discharge Instructions (Signed)
You were evaluated in the Emergency Department and after careful evaluation, we did not find any emergent condition requiring admission or further testing in the hospital.  Your exam/testing today was overall reassuring.  Recommend follow-up with your primary care doctor to discuss your blood sugars and insulin regimen.  Please return to the Emergency Department if you experience any worsening of your condition.  Thank you for allowing [place] to be a part of your care.

## 2021-09-01 NOTE — ED Provider Notes (Signed)
[facility] Emergency Department Provider Note MRN:  [id number]  Arrival date & time: 09/01/21     Chief Complaint   Hypoglycemia   History of Present Illness   Regina Watson is a 56 y.o. year-old female with a history of schizophrenia, hypertension, diabetes presenting to the ED with chief complaint of hypoglycemia.  Reportedly had a blood sugar in the 40s with symptoms.  Patient endorses feeling generally unwell, lightheaded.  She explains that she had a large dinner and then was given her insulin, she does not know why her sugar dropped low.  Currently without symptoms.  EMS intervened and provided patient with food.  Review of Systems  A thorough review of systems was obtained and all systems are negative except as noted in the HPI and PMH.   Patient's Health History    Past Medical History:  Diagnosis Date   Anemia    Diabetes mellitus without complication ([place])    Hypertension    MDD (major depressive disorder)    Neuropathy 2010   Schizophrenia ([place])     Past Surgical History:  Procedure Laterality Date   TOE AMPUTATION      Family History  Problem Relation Age of Onset   Hypertension Mother    Cancer Mother    Diabetes Father     Social History   Socioeconomic History   Marital status: Widowed    Spouse name: Not on file   Number of children: Not on file   Years of education: Not on file   Highest education level: Not on file  Occupational History   Not on file  Tobacco Use   Smoking status: Every Day    Packs/day: 1.00    Years: 20.00    Pack years: 20.00    Types: Cigarettes   Smokeless tobacco: Never  Substance and Sexual Activity   Alcohol use: No   Drug use: No   Sexual activity: Yes    Birth control/protection: None  Other Topics Concern   Not on file  Social History Narrative   epworth scale score 18   Social Determinants of Health   Financial Resource Strain: Not on file  Food Insecurity: Not on file   Transportation Needs: Not on file  Physical Activity: Not on file  Stress: Not on file  Social Connections: Not on file  Intimate Partner Violence: Not on file     Physical Exam   Vitals:   09/01/21 0300 09/01/21 0400  BP: 113/71 140/81  Pulse: 90 91  Resp: 11 12  Temp:    SpO2: 96% 100%    CONSTITUTIONAL: Chronically ill-appearing, NAD NEURO:  Alert and oriented x 3, no focal deficits; somewhat tangential speech EYES:  eyes equal and reactive ENT/NECK:  no LAD, no JVD CARDIO: Regular rate, well-perfused, normal S1 and S2 PULM:  CTAB no wheezing or rhonchi GI/GU:  non-distended, non-tender MSK/SPINE:  No gross deformities, no edema SKIN:  no rash, atraumatic   *Additional and/or pertinent findings included in MDM below  Diagnostic and Interventional Summary    EKG Interpretation  Date/Time:  Wednesday September 01 2021 00:52:20 EST Ventricular Rate:  101 PR Interval:  186 QRS Duration: 82 QT Interval:  361 QTC Calculation: 468 R Axis:   29 Text Interpretation: Sinus tachycardia No significant change was found Confirmed by Gerlene Fee [id number]) on 09/01/2021 1:16:27 AM       Labs Reviewed  URINALYSIS, ROUTINE W REFLEX MICROSCOPIC - Abnormal; Notable for the following components:  Result Value   APPearance HAZY (*)    Glucose, UA >=500 (*)    Ketones, ur 20 (*)    Leukocytes,Ua TRACE (*)    Bacteria, UA RARE (*)    All other components within normal limits  COMPREHENSIVE METABOLIC PANEL - Abnormal; Notable for the following components:   Potassium 3.4 (*)    Glucose, Bld 109 (*)    BUN 28 (*)    ALT 60 (*)    Total Bilirubin <0.1 (*)    All other components within normal limits  CBG MONITORING, ED - Abnormal; Notable for the following components:   Glucose-Capillary 121 (*)    All other components within normal limits  CBC  CBG MONITORING, ED  CBG MONITORING, ED    No orders to display    Medications  acetaminophen (TYLENOL) tablet 1,000 mg  (1,000 mg Oral Given 09/01/21 0412)     Procedures  /  Critical Care Procedures  ED Course and Medical Decision Making  Initial Impression and Ddx Hypoglycemic episode after receiving evening insulin.  Unclear cause, could be that patient forgot to eat or that the insulin dose was accidentally too high.  Does not think these things occurred however she is a difficult historian due to her psychiatric history.  Also considering occult UTI, will screen with urinalysis.  Patient had some lightheadedness and nausea likely related to the hypoglycemia but will obtain screening EKG.  We will monitor her glucose for the next couple hours and reassess.  Past medical/surgical history that increases complexity of ED encounter: Diabetes, schizophrenia  Interpretation of Diagnostics I personally reviewed the EKG and my interpretation is as follows: Sinus rhythm, no significant changes from prior    Basic labs are reassuring, blood sugar is stable on multiple repeats.  Patient Reassessment and Ultimate Disposition/Management Patient looks and feels well, appropriate for discharge.  Patient management required discussion with the following services or consulting groups:  None  Complexity of Problems Addressed Acute complicated illness or Injury  Additional Data Reviewed and Analyzed Further history obtained from: EMS on arrival  Patient Encounter Risk Assessment High:  Consideration of hospitalization  Barth Kirks. Sedonia Small, MD [place] [email or web address]  Final Clinical Impressions(s) / ED Diagnoses     ICD-10-CM   1. Hypoglycemia  E16.2       ED Discharge Orders     None        Discharge Instructions Discussed with and Provided to Patient:     Discharge Instructions      You were evaluated in the Emergency Department and after careful evaluation, we did not find any emergent condition requiring admission or further testing in the  hospital.  Your exam/testing today was overall reassuring.  Recommend follow-up with your primary care doctor to discuss your blood sugars and insulin regimen.  Please return to the Emergency Department if you experience any worsening of your condition.  Thank you for allowing [place] to be a part of your care.        Maudie Flakes, MD 09/01/21 [phone]

## 2021-09-01 NOTE — ED Triage Notes (Signed)
Pt's blood sugar was 40 given ice cream, apple juice, jelly sandwich and waffle with syrup. Per facility she got 11 units of regular insulin and 30 of lantus.

## 2021-09-27 ENCOUNTER — Ambulatory Visit (INDEPENDENT_AMBULATORY_CARE_PROVIDER_SITE_OTHER): Payer: Medicaid Other | Admitting: Gastroenterology

## 2021-10-05 ENCOUNTER — Encounter (INDEPENDENT_AMBULATORY_CARE_PROVIDER_SITE_OTHER): Payer: Self-pay | Admitting: Gastroenterology

## 2021-10-05 ENCOUNTER — Ambulatory Visit (INDEPENDENT_AMBULATORY_CARE_PROVIDER_SITE_OTHER): Payer: Medicaid Other | Admitting: Gastroenterology

## 2021-10-11 ENCOUNTER — Other Ambulatory Visit: Payer: Self-pay

## 2021-10-11 ENCOUNTER — Encounter (HOSPITAL_COMMUNITY): Payer: Self-pay | Admitting: Emergency Medicine

## 2021-10-11 ENCOUNTER — Emergency Department (HOSPITAL_COMMUNITY)
Admission: EM | Admit: 2021-10-11 | Discharge: 2021-10-12 | Disposition: A | Discharge status: Home / Self Care | Payer: Medicaid Other | Attending: Emergency Medicine | Admitting: Emergency Medicine

## 2021-10-11 DIAGNOSIS — Z7982 Long term (current) use of aspirin: Secondary | ICD-10-CM | POA: Insufficient documentation

## 2021-10-11 DIAGNOSIS — E1140 Type 2 diabetes mellitus with diabetic neuropathy, unspecified: Secondary | ICD-10-CM | POA: Diagnosis not present

## 2021-10-11 DIAGNOSIS — I10 Essential (primary) hypertension: Secondary | ICD-10-CM | POA: Insufficient documentation

## 2021-10-11 DIAGNOSIS — Z7984 Long term (current) use of oral hypoglycemic drugs: Secondary | ICD-10-CM | POA: Diagnosis not present

## 2021-10-11 DIAGNOSIS — Z91040 Latex allergy status: Secondary | ICD-10-CM | POA: Diagnosis not present

## 2021-10-11 DIAGNOSIS — Z79899 Other long term (current) drug therapy: Secondary | ICD-10-CM | POA: Diagnosis not present

## 2021-10-11 DIAGNOSIS — Z794 Long term (current) use of insulin: Secondary | ICD-10-CM | POA: Insufficient documentation

## 2021-10-11 DIAGNOSIS — R110 Nausea: Secondary | ICD-10-CM

## 2021-10-11 LAB — CBG MONITORING, ED: Glucose-Capillary: 132 mg/dL — ABNORMAL HIGH (ref 70–99)

## 2021-10-11 MED ORDER — ONDANSETRON 4 MG PO TBDP
4.0000 mg | ORAL_TABLET | Freq: Once | ORAL | Status: AC
  Administered 2021-10-11: 4 mg via ORAL
  Filled 2021-10-11: qty 1

## 2021-10-11 NOTE — ED Triage Notes (Signed)
Pt c/o nausea today and vomited once yesterday. Pt's blood sugar was 101

## 2021-10-11 NOTE — ED Provider Notes (Signed)
[facility] EMERGENCY DEPARTMENT Provider Note   CSN: [id number] Arrival date & time: 10/11/21  2230     History  Chief Complaint  Patient presents with   Nausea    Regina Watson is a 56 y.o. female.  HPI     This is a 56 year old female who presents with concerns for low blood sugar.  Patient reports that she has felt nauseous and vomited once today.  She states her blood sugars were low at home in the 60s.  She reports that she ate a normal meal including peanut butter but did not feel that her blood sugar rebounded appropriately.  She reports taking her normal amount of insulin.  No recent illnesses.  Has had some nausea but no ongoing vomiting.  Normal bowel movements.  No fevers.  CBG here 132.  Home Medications Prior to Admission medications   Medication Sig Start Date End Date Taking? Authorizing Provider  acetaminophen (TYLENOL) 325 MG tablet Take 650 mg by mouth every 4 (four) hours as needed for mild pain or moderate pain.    [provider]  acetaZOLAMIDE ER (DIAMOX) 500 MG capsule Take 500 mg by mouth 2 (two) times daily.    [provider]  amLODipine (NORVASC) 5 MG tablet Take 1 tablet (5 mg total) by mouth daily. 01/21/21   Orson Eva, MD  aspirin EC 81 MG tablet Take 81 mg by mouth every morning.    [provider]  atorvastatin (LIPITOR) 10 MG tablet Take 10 mg by mouth every evening.     [provider]  benztropine (COGENTIN) 0.5 MG tablet Take 0.5 mg by mouth 2 (two) times daily.    [provider]  brimonidine (ALPHAGAN) 0.2 % ophthalmic solution Place 1 drop into the left eye every 8 (eight) hours.    [provider]  cefdinir (OMNICEF) 300 MG capsule Take 1 capsule (300 mg total) by mouth 2 (two) times daily. Patient not taking: Reported on 08/25/2021 01/20/21   Orson Eva, MD  colchicine 0.6 MG tablet Take 0.6 mg by mouth daily. 08/03/21   [provider]  divalproex (DEPAKOTE ER) 500 MG 24 hr  tablet Take 1 tablet (500 mg total) by mouth at bedtime. Patient taking differently: Take 1,000 mg by mouth at bedtime. 10/17/19 08/25/21  Manuella Ghazi, Pratik D, DO  dorzolamide (TRUSOPT) 2 % ophthalmic solution Place 1 drop into the left eye 2 (two) times daily. 09/10/20   [provider]  FARXIGA 10 MG TABS tablet Take 10 mg by mouth daily. 06/30/21   [provider]  furosemide (LASIX) 40 MG tablet Take 1 tablet (40 mg total) by mouth daily as needed for fluid or edema. 10/17/20   Lavina Hamman, MD  gabapentin (NEURONTIN) 400 MG capsule Take 1 capsule (400 mg total) by mouth 3 (three) times daily. Patient taking differently: Take 400 mg by mouth 4 (four) times daily. 10/17/20   Lavina Hamman, MD  hydrOXYzine (ATARAX) 25 MG tablet Take 25 mg by mouth 3 (three) times daily.    [provider]  insulin glargine (LANTUS) 100 UNIT/ML injection Inject 0.14 mLs (14 Units total) into the skin at bedtime. Patient taking differently: Inject 30 Units into the skin 2 (two) times daily. 10/17/19   Manuella Ghazi, Pratik D, DO  lactulose (CHRONULAC) 10 GM/15ML solution Take 30 mLs (20 g total) by mouth 2 (two) times daily. 10/17/20   Lavina Hamman, MD  levocetirizine (XYZAL) 5 MG tablet Take 5 mg by  mouth every evening.    [provider]  metFORMIN (GLUCOPHAGE) 1000 MG tablet Take 1,000 mg by mouth 2 (two) times daily with a meal.    [provider]  polyethylene glycol (MIRALAX / GLYCOLAX) 17 g packet Take 17 g by mouth daily.    [provider]  prednisoLONE acetate (PRED FORTE) 1 % ophthalmic suspension Place 1 drop into both eyes 4 (four) times daily.    [provider]  QUEtiapine (SEROQUEL XR) 200 MG 24 hr tablet Take 1 tablet (200 mg total) by mouth every evening. Patient taking differently: Take 800 mg by mouth every evening. 10/17/19 08/25/21  Manuella Ghazi, Pratik D, DO  risperiDONE (RISPERDAL M-TABS) 3 MG disintegrating tablet Take 3 mg by mouth 2 (two) times daily.     [provider]  Semaglutide,0.25 or 0.5MG /DOS, (OZEMPIC, 0.25 OR 0.5 MG/DOSE,) 2 MG/1.5ML SOPN 0.25 mg by Subconjunctival route once a week.    [provider]  sennosides-docusate sodium (SENOKOT-S) 8.6-50 MG tablet Take 1 tablet by mouth in the morning and at bedtime.    [provider]  timolol (TIMOPTIC) 0.5 % ophthalmic solution Place 1 drop into the left eye 2 (two) times daily. 09/09/20   [provider]  tiotropium (SPIRIVA) 18 MCG inhalation capsule Place 18 mcg into inhaler and inhale daily.    [provider]  VENTOLIN HFA 108 (90 Base) MCG/ACT inhaler Inhale 1-2 puffs into the lungs every 4 (four) hours as needed for wheezing or shortness of breath. 08/23/21   [provider]  Vitamin D, Ergocalciferol, (DRISDOL) 1.25 MG (50000 UNIT) CAPS capsule Take 50,000 Units by mouth every 7 (seven) days.    [provider]      Allergies    Bactrim [sulfamethoxazole-trimethoprim], Penicillins, and Latex    Review of Systems   Review of Systems  Constitutional:  Negative for fever.  Gastrointestinal:  Positive for nausea and vomiting. Negative for abdominal pain, constipation and diarrhea.  All other systems reviewed and are negative.  Physical Exam Updated Vital Signs BP 118/80 (BP Location: Right Arm)    Pulse (!) 104    Temp 98 F (36.7 C) (Oral)    Resp 15    Ht 1.753 m (5\' 9" )    Wt 90.7 kg    SpO2 99%    BMI 29.53 kg/m  Physical Exam Vitals and nursing note reviewed.  Constitutional:      Appearance: She is well-developed. She is not ill-appearing.  HENT:     Head: Normocephalic and atraumatic.     Nose: Nose normal.     Mouth/Throat:     Mouth: Mucous membranes are moist.  Eyes:     Pupils: Pupils are equal, round, and reactive to light.  Cardiovascular:     Rate and Rhythm: Normal rate and regular rhythm.     Heart sounds: Normal heart sounds.  Pulmonary:     Effort: Pulmonary effort is normal. No respiratory  distress.     Breath sounds: No wheezing.  Abdominal:     General: Bowel sounds are normal.     Palpations: Abdomen is soft.     Tenderness: There is no abdominal tenderness. There is no guarding or rebound.  Musculoskeletal:     Cervical back: Neck supple.     Right lower leg: Edema present.     Left lower leg: Edema present.  Skin:    General: Skin is warm and dry.  Neurological:     Mental Status:  She is alert and oriented to person, place, and time.  Psychiatric:        Mood and Affect: Mood normal.    ED Results / Procedures / Treatments   Labs (all labs ordered are listed, but only abnormal results are displayed) Labs Reviewed  BASIC METABOLIC PANEL - Abnormal; Notable for the following components:      Result Value   Chloride 112 (*)    CO2 20 (*)    Glucose, Bld 122 (*)    All other components within normal limits  CBC WITH DIFFERENTIAL/PLATELET - Abnormal; Notable for the following components:   RBC 5.12 (*)    Hemoglobin 15.8 (*)    HCT 49.2 (*)    All other components within normal limits  CBG MONITORING, ED - Abnormal; Notable for the following components:   Glucose-Capillary 132 (*)    All other components within normal limits    EKG None  Radiology No results found.  Procedures Procedures    Medications Ordered in ED Medications  ondansetron (ZOFRAN-ODT) disintegrating tablet 4 mg (4 mg Oral Given 10/11/21 2253)    ED Course/ Medical Decision Making/ A&P                           Medical Decision Making  This patient presents to the ED for concern of nausea and hypoglycemia, this involves an extensive number of treatment options, and is a complaint that carries with it a high risk of complications and morbidity.  The differential diagnosis includes hypoglycemia, insulin indiscretion, intra-abdominal pathology such as gastritis, reflux, gastroenteritis  MDM:    This is a 56 year old female with history of diabetes who presents with concerns  for hypoglycemia.  Reports blood sugars in the 60s without appropriate rebound with eating.  Reports that she has been taking her medications as prescribed.  Reports 1 episode of nausea and vomiting.  Denies abdominal pain or diarrhea.  No known sick contacts.  She is nontoxic.  Vital signs notable for mild tachycardia.  Abdominal exam is benign.  She appears slightly volume overloaded.  Blood sugar here greater than 100.  Will obtain lab work.  Labs are largely unremarkable.  Blood sugar in the 100s.  No significant metabolic derangements.  This is very reassuring.  Discussed with patient that she needs to eat small frequent meals and take her insulin as directed. (Labs, imaging)  Labs: I Ordered, and personally interpreted labs.  The pertinent results include: CBC, CMP reassuring  Imaging Studies ordered: I ordered imaging studies including none I independently visualized and interpreted imaging. I agree with the radiologist interpretation  Additional history obtained from patient.  External records from outside source obtained and reviewed including prior visits  Critical Interventions: N/A  Consultations: I requested consultation with the NA,  and discussed lab and imaging findings as well as pertinent plan - they recommend: N/A  Cardiac Monitoring: The patient was maintained on a cardiac monitor.  I personally viewed and interpreted the cardiac monitored which showed an underlying rhythm of: Normal sinus rhythm  Reevaluation: After the interventions noted above, I reevaluated the patient and found that they have :improved   Considered admission for: N/A  Social Determinants of Health: Has legal guardian  Disposition: Discharge  Co morbidities that complicate the patient evaluation  Past Medical History:  Diagnosis Date   Anemia    Diabetes mellitus without complication ([place])    Hypertension    MDD (major depressive  disorder)    Neuropathy 2010   Schizophrenia ([place])       Medicines Meds ordered this encounter  Medications   ondansetron (ZOFRAN-ODT) disintegrating tablet 4 mg    I have reviewed the patients home medicines and have made adjustments as needed  Problem List / ED Course: Problem List Items Addressed This Visit   None Visit Diagnoses     Nausea    -  Primary                   Final Clinical Impression(s) / ED Diagnoses Final diagnoses:  Nausea    Rx / DC Orders ED Discharge Orders     None         Merryl Hacker, MD 10/12/21 [phone]

## 2021-10-12 LAB — BASIC METABOLIC PANEL
Anion gap: 9 (ref 5–15)
BUN: 16 mg/dL (ref 6–20)
CO2: 20 mmol/L — ABNORMAL LOW (ref 22–32)
Calcium: 9.1 mg/dL (ref 8.9–10.3)
Chloride: 112 mmol/L — ABNORMAL HIGH (ref 98–111)
Creatinine, Ser: 0.69 mg/dL (ref 0.44–1.00)
GFR, Estimated: >60 mL/min (ref >60)
Glucose, Bld: 122 mg/dL — ABNORMAL HIGH (ref 70–99)
Potassium: 3.8 mmol/L (ref 3.5–5.1)
Sodium: 141 mmol/L (ref 135–145)

## 2021-10-12 LAB — CBC WITH DIFFERENTIAL/PLATELET
Abs Immature Granulocytes: 0.07 10*3/uL (ref 0.00–0.07)
Basophils Absolute: 0 10*3/uL (ref 0.0–0.1)
Basophils Relative: 1 %
Eosinophils Absolute: 0.3 10*3/uL (ref 0.0–0.5)
Eosinophils Relative: 4 %
HCT: 49.2 % — ABNORMAL HIGH (ref 36.0–46.0)
Hemoglobin: 15.8 g/dL — ABNORMAL HIGH (ref 12.0–15.0)
Immature Granulocytes: 1 %
Lymphocytes Relative: 30 %
Lymphs Abs: 2.1 10*3/uL (ref 0.7–4.0)
MCH: 30.9 pg (ref 26.0–34.0)
MCHC: 32.1 g/dL (ref 30.0–36.0)
MCV: 96.1 fL (ref 80.0–100.0)
Monocytes Absolute: 0.5 10*3/uL (ref 0.1–1.0)
Monocytes Relative: 8 %
Neutro Abs: 4 10*3/uL (ref 1.7–7.7)
Neutrophils Relative %: 56 %
Platelets: 222 10*3/uL (ref 150–400)
RBC: 5.12 MIL/uL — ABNORMAL HIGH (ref 3.87–5.11)
RDW: 14.4 % (ref 11.5–15.5)
WBC: 7 10*3/uL (ref 4.0–10.5)
nRBC: 0 % (ref 0.0–0.2)

## 2021-10-12 NOTE — ED Notes (Signed)
Regina Watson called back to inquire about the patient. Advised the patient was being discharged and needed transportation home. I also advised that multiple attempts have been made to [facility] with no answer. She stated she was living in [place] and would see if someone who lived closer could pick up the patient.

## 2021-10-12 NOTE — ED Notes (Addendum)
Called paula Clemments, Legal Guardian, twice with no answer.   Also called sister Helene Kelp. She did not answer.

## 2021-10-12 NOTE — ED Notes (Signed)
Received call from Leo Rod, patient's caregiver, will be here to pick her up in about an hour

## 2021-10-12 NOTE — ED Notes (Signed)
Patient is asleep. Called [facility]. No answer.

## 2021-10-12 NOTE — ED Notes (Signed)
2 attempts made at contacting [facility] with no answer.

## 2021-10-12 NOTE — ED Notes (Signed)
Patient is asleep. Called [facility]. No answer

## 2021-10-12 NOTE — ED Notes (Signed)
Patient asleep.

## 2021-10-12 NOTE — Discharge Instructions (Signed)
You were seen today for concerns for hypoglycemia and nausea.  Your blood sugars here have been reassuring.  Make sure that you are taking your insulin as directed.  Eat small frequent meals.

## 2021-10-12 NOTE — ED Notes (Signed)
Pt sleeping, awaiting transport back to assisted living facility

## 2021-10-12 NOTE — ED Notes (Signed)
Called [facility] to advised the patient was being discharged and needed transportation back home. The nurse on duty stated they did not have transportation and we would have to bring her back. I advised that we do not provide transportation and emergency services can only provide transportation when the patient meets certain qualifications. This patient is ambulatory and does not qualify for those services. Nurse asked if we would call her back in 15 minutes.

## 2021-12-13 ENCOUNTER — Emergency Department (HOSPITAL_COMMUNITY)
Admission: EM | Admit: 2021-12-13 | Discharge: 2021-12-14 | Disposition: A | Discharge status: Home / Self Care | Payer: Medicaid Other | Attending: Emergency Medicine | Admitting: Emergency Medicine

## 2021-12-13 ENCOUNTER — Encounter (HOSPITAL_COMMUNITY): Payer: Self-pay

## 2021-12-13 ENCOUNTER — Other Ambulatory Visit: Payer: Self-pay

## 2021-12-13 ENCOUNTER — Emergency Department (HOSPITAL_COMMUNITY): Payer: Medicaid Other

## 2021-12-13 DIAGNOSIS — Z79899 Other long term (current) drug therapy: Secondary | ICD-10-CM | POA: Insufficient documentation

## 2021-12-13 DIAGNOSIS — E119 Type 2 diabetes mellitus without complications: Secondary | ICD-10-CM | POA: Insufficient documentation

## 2021-12-13 DIAGNOSIS — Z91040 Latex allergy status: Secondary | ICD-10-CM | POA: Diagnosis not present

## 2021-12-13 DIAGNOSIS — Z046 Encounter for general psychiatric examination, requested by authority: Secondary | ICD-10-CM | POA: Diagnosis present

## 2021-12-13 DIAGNOSIS — F29 Unspecified psychosis not due to a substance or known physiological condition: Secondary | ICD-10-CM | POA: Insufficient documentation

## 2021-12-13 DIAGNOSIS — I10 Essential (primary) hypertension: Secondary | ICD-10-CM | POA: Insufficient documentation

## 2021-12-13 DIAGNOSIS — F329 Major depressive disorder, single episode, unspecified: Secondary | ICD-10-CM | POA: Insufficient documentation

## 2021-12-13 DIAGNOSIS — Z7982 Long term (current) use of aspirin: Secondary | ICD-10-CM | POA: Diagnosis not present

## 2021-12-13 DIAGNOSIS — Z20822 Contact with and (suspected) exposure to covid-19: Secondary | ICD-10-CM | POA: Diagnosis not present

## 2021-12-13 DIAGNOSIS — Z794 Long term (current) use of insulin: Secondary | ICD-10-CM | POA: Insufficient documentation

## 2021-12-13 DIAGNOSIS — K5900 Constipation, unspecified: Secondary | ICD-10-CM | POA: Diagnosis not present

## 2021-12-13 DIAGNOSIS — R109 Unspecified abdominal pain: Secondary | ICD-10-CM | POA: Insufficient documentation

## 2021-12-13 LAB — URINALYSIS, ROUTINE W REFLEX MICROSCOPIC
Bilirubin Urine: NEGATIVE
Glucose, UA: >=500 mg/dL — AB
Ketones, ur: 5 mg/dL — AB
Nitrite: NEGATIVE
Protein, ur: NEGATIVE mg/dL
Specific Gravity, Urine: 1.026 (ref 1.005–1.030)
WBC, UA: >50 WBC/hpf — ABNORMAL HIGH (ref 0–5)
pH: 5 (ref 5.0–8.0)

## 2021-12-13 LAB — COMPREHENSIVE METABOLIC PANEL
ALT: 27 U/L (ref 0–44)
AST: 23 U/L (ref 15–41)
Albumin: 3.7 g/dL (ref 3.5–5.0)
Alkaline Phosphatase: 86 U/L (ref 38–126)
Anion gap: 7 (ref 5–15)
BUN: 17 mg/dL (ref 6–20)
CO2: 20 mmol/L — ABNORMAL LOW (ref 22–32)
Calcium: 9.3 mg/dL (ref 8.9–10.3)
Chloride: 111 mmol/L (ref 98–111)
Creatinine, Ser: 0.58 mg/dL (ref 0.44–1.00)
GFR, Estimated: >60 mL/min (ref >60)
Glucose, Bld: 302 mg/dL — ABNORMAL HIGH (ref 70–99)
Potassium: 3.4 mmol/L — ABNORMAL LOW (ref 3.5–5.1)
Sodium: 138 mmol/L (ref 135–145)
Total Bilirubin: 0.3 mg/dL (ref 0.3–1.2)
Total Protein: 7.5 g/dL (ref 6.5–8.1)

## 2021-12-13 LAB — RAPID URINE DRUG SCREEN, HOSP PERFORMED
Amphetamines: NOT DETECTED
Barbiturates: NOT DETECTED
Benzodiazepines: NOT DETECTED
Cocaine: NOT DETECTED
Opiates: NOT DETECTED
Tetrahydrocannabinol: NOT DETECTED

## 2021-12-13 LAB — CBC WITH DIFFERENTIAL/PLATELET
Abs Immature Granulocytes: 0.05 10*3/uL (ref 0.00–0.07)
Basophils Absolute: 0 10*3/uL (ref 0.0–0.1)
Basophils Relative: 0 %
Eosinophils Absolute: 0.3 10*3/uL (ref 0.0–0.5)
Eosinophils Relative: 5 %
HCT: 39.8 % (ref 36.0–46.0)
Hemoglobin: 12.9 g/dL (ref 12.0–15.0)
Immature Granulocytes: 1 %
Lymphocytes Relative: 30 %
Lymphs Abs: 1.7 10*3/uL (ref 0.7–4.0)
MCH: 30.6 pg (ref 26.0–34.0)
MCHC: 32.4 g/dL (ref 30.0–36.0)
MCV: 94.3 fL (ref 80.0–100.0)
Monocytes Absolute: 0.4 10*3/uL (ref 0.1–1.0)
Monocytes Relative: 7 %
Neutro Abs: 3.3 10*3/uL (ref 1.7–7.7)
Neutrophils Relative %: 57 %
Platelets: 221 10*3/uL (ref 150–400)
RBC: 4.22 MIL/uL (ref 3.87–5.11)
RDW: 13.3 % (ref 11.5–15.5)
WBC: 5.8 10*3/uL (ref 4.0–10.5)
nRBC: 0 % (ref 0.0–0.2)

## 2021-12-13 LAB — RESP PANEL BY RT-PCR (FLU A&B, COVID) ARPGX2
Influenza A by PCR: NEGATIVE
Influenza B by PCR: NEGATIVE
SARS Coronavirus 2 by RT PCR: NEGATIVE

## 2021-12-13 LAB — ETHANOL: Alcohol, Ethyl (B): <10 mg/dL (ref <10)

## 2021-12-13 LAB — CBG MONITORING, ED
Glucose-Capillary: 119 mg/dL — ABNORMAL HIGH (ref 70–99)
Glucose-Capillary: 201 mg/dL — ABNORMAL HIGH (ref 70–99)

## 2021-12-13 MED ORDER — DIVALPROEX SODIUM ER 500 MG PO TB24
1000.0000 mg | ORAL_TABLET | Freq: Every day | ORAL | Status: DC
  Administered 2021-12-13: 1000 mg via ORAL
  Filled 2021-12-13: qty 2

## 2021-12-13 MED ORDER — POLYETHYLENE GLYCOL 3350 17 G PO PACK: 17.0000 g | PACK | Freq: Every day | ORAL | Status: DC | PRN

## 2021-12-13 MED ORDER — AMLODIPINE BESYLATE 5 MG PO TABS
5.0000 mg | ORAL_TABLET | Freq: Every day | ORAL | Status: DC
  Administered 2021-12-13: 5 mg via ORAL
  Filled 2021-12-13: qty 1

## 2021-12-13 MED ORDER — DORZOLAMIDE HCL 2 % OP SOLN: 1.0000 [drp] | Freq: Two times a day (BID) | OPHTHALMIC | Status: DC

## 2021-12-13 MED ORDER — DAPAGLIFLOZIN PROPANEDIOL 10 MG PO TABS
10.0000 mg | ORAL_TABLET | Freq: Every day | ORAL | Status: DC
  Filled 2021-12-13 (×2): qty 1

## 2021-12-13 MED ORDER — ATORVASTATIN CALCIUM 10 MG PO TABS
10.0000 mg | ORAL_TABLET | Freq: Every evening | ORAL | Status: DC
  Administered 2021-12-13: 10 mg via ORAL
  Filled 2021-12-13: qty 1

## 2021-12-13 MED ORDER — TIOTROPIUM BROMIDE MONOHYDRATE 18 MCG IN CAPS
18.0000 ug | ORAL_CAPSULE | Freq: Every day | RESPIRATORY_TRACT | Status: DC
Start: 2021-12-13 — End: 2021-12-13

## 2021-12-13 MED ORDER — ALBUTEROL SULFATE HFA 108 (90 BASE) MCG/ACT IN AERS: 1.0000 | INHALATION_SPRAY | RESPIRATORY_TRACT | Status: DC | PRN

## 2021-12-13 MED ORDER — METFORMIN HCL 500 MG PO TABS
1000.0000 mg | ORAL_TABLET | Freq: Two times a day (BID) | ORAL | Status: DC
Start: 2021-12-13 — End: 2021-12-14
  Administered 2021-12-13 – 2021-12-14 (×2): 1000 mg via ORAL
  Filled 2021-12-13 (×2): qty 2

## 2021-12-13 MED ORDER — LEVOCETIRIZINE DIHYDROCHLORIDE 5 MG PO TABS: 5.0000 mg | ORAL_TABLET | Freq: Every evening | ORAL | Status: DC

## 2021-12-13 MED ORDER — SENNOSIDES-DOCUSATE SODIUM 8.6-50 MG PO TABS: 1.0000 | ORAL_TABLET | Freq: Every day | ORAL | Status: DC | PRN

## 2021-12-13 MED ORDER — ASPIRIN EC 81 MG PO TBEC
81.0000 mg | DELAYED_RELEASE_TABLET | Freq: Every morning | ORAL | Status: DC
Start: 2021-12-14 — End: 2021-12-14

## 2021-12-13 MED ORDER — FUROSEMIDE 40 MG PO TABS: 40.0000 mg | ORAL_TABLET | Freq: Every day | ORAL | Status: DC | PRN

## 2021-12-13 MED ORDER — BRIMONIDINE TARTRATE 0.2 % OP SOLN: 1.0000 [drp] | Freq: Three times a day (TID) | OPHTHALMIC | Status: DC

## 2021-12-13 MED ORDER — TIMOLOL MALEATE 0.5 % OP SOLN: 1.0000 [drp] | Freq: Two times a day (BID) | OPHTHALMIC | Status: DC

## 2021-12-13 MED ORDER — HYDROXYZINE HCL 25 MG PO TABS
25.0000 mg | ORAL_TABLET | Freq: Three times a day (TID) | ORAL | Status: DC
  Administered 2021-12-13: 25 mg via ORAL
  Filled 2021-12-13 (×2): qty 1

## 2021-12-13 MED ORDER — VITAMIN D (ERGOCALCIFEROL) 1.25 MG (50000 UNIT) PO CAPS: 50000.0000 [IU] | ORAL_CAPSULE | ORAL | Status: DC

## 2021-12-13 MED ORDER — INSULIN GLARGINE-YFGN 100 UNIT/ML ~~LOC~~ SOLN
25.0000 [IU] | Freq: Two times a day (BID) | SUBCUTANEOUS | Status: DC
  Administered 2021-12-13: 25 [IU] via SUBCUTANEOUS
  Filled 2021-12-13 (×4): qty 0.25

## 2021-12-13 MED ORDER — ACETAMINOPHEN 325 MG PO TABS: 650.0000 mg | ORAL_TABLET | ORAL | Status: DC | PRN

## 2021-12-13 MED ORDER — ALBUTEROL SULFATE (2.5 MG/3ML) 0.083% IN NEBU: 2.5000 mg | INHALATION_SOLUTION | RESPIRATORY_TRACT | Status: DC | PRN

## 2021-12-13 MED ORDER — SEMAGLUTIDE(0.25 OR 0.5MG/DOS) 2 MG/1.5ML ~~LOC~~ SOPN: 0.2500 mg | PEN_INJECTOR | SUBCUTANEOUS | Status: DC

## 2021-12-13 MED ORDER — BENZTROPINE MESYLATE 1 MG PO TABS
0.5000 mg | ORAL_TABLET | Freq: Two times a day (BID) | ORAL | Status: DC
  Administered 2021-12-13: 0.5 mg via ORAL
  Filled 2021-12-13: qty 1

## 2021-12-13 MED ORDER — RISPERIDONE 1 MG PO TBDP
3.0000 mg | ORAL_TABLET | Freq: Two times a day (BID) | ORAL | Status: DC
  Administered 2021-12-13: 3 mg via ORAL
  Filled 2021-12-13: qty 3

## 2021-12-13 MED ORDER — ACETAZOLAMIDE ER 500 MG PO CP12
500.0000 mg | ORAL_CAPSULE | Freq: Two times a day (BID) | ORAL | Status: DC
  Administered 2021-12-13: 500 mg via ORAL
  Filled 2021-12-13 (×4): qty 1

## 2021-12-13 MED ORDER — POLYETHYLENE GLYCOL 3350 17 G PO PACK
17.0000 g | PACK | Freq: Once | ORAL | Status: AC
Start: 2021-12-13 — End: 2021-12-13
  Administered 2021-12-13: 17 g via ORAL
  Filled 2021-12-13: qty 1

## 2021-12-13 MED ORDER — LACTULOSE 10 GM/15ML PO SOLN
20.0000 g | Freq: Two times a day (BID) | ORAL | Status: DC
  Administered 2021-12-13: 20 g via ORAL
  Filled 2021-12-13: qty 30

## 2021-12-13 MED ORDER — QUETIAPINE FUMARATE ER 200 MG PO TB24
800.0000 mg | ORAL_TABLET | Freq: Every evening | ORAL | Status: DC
  Administered 2021-12-13: 800 mg via ORAL
  Filled 2021-12-13 (×2): qty 4

## 2021-12-13 MED ORDER — LORATADINE 10 MG PO TABS: 10.0000 mg | ORAL_TABLET | Freq: Every day | ORAL | Status: DC

## 2021-12-13 MED ORDER — GABAPENTIN 400 MG PO CAPS
400.0000 mg | ORAL_CAPSULE | Freq: Four times a day (QID) | ORAL | Status: DC
  Administered 2021-12-13 (×2): 400 mg via ORAL
  Filled 2021-12-13 (×2): qty 1
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
## 2021-12-13 NOTE — ED Notes (Signed)
Independent ADLs.  ?

## 2021-12-13 NOTE — ED Notes (Signed)
Patient hallucinating and asking for food when food is not in the room or near the patient. Patient does not have a diet order. Dr. Gilford Raid made aware and there are orders for psych consult.  ?
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
## 2021-12-14 LAB — CBG MONITORING, ED: Glucose-Capillary: 85 mg/dL (ref 70–99)
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
## 2021-12-14 NOTE — ED Notes (Signed)
Breakfast tray given to pt. Pt verbalized it is not staff that she is not comfortable with , it is other patients in the home. Pt states other patients want to argue with her and she doesn't like that.  ?

## 2021-12-14 NOTE — ED Notes (Signed)
Crystal from [facility] called to get an update on pt. ?

## 2021-12-14 NOTE — Discharge Instructions (Signed)
Follow-up as instructed by behavioral health 

## 2021-12-14 NOTE — ED Notes (Signed)
Pt able to take meds whole ?

## 2021-12-14 NOTE — ED Notes (Signed)
Pt requesting a cigarette to smoke. Pt aware there is no smoking here. Pt ambulating to bathroom independently.  ?

## 2021-12-14 NOTE — ED Notes (Signed)
Pt being TTS at this time  

## 2021-12-14 NOTE — ED Notes (Signed)
CSW spoke with pts legal guardian who confirms that plan will be for pt to return to the ALF she arrived from. LG explained that she was not made aware pt was in the ED. CSW explained why pt presented to the ED and provided update. LG requested that CSW reach out to the ALF to confirm they are aware pt is ready for DC.  ? ?CSW spoke to Lavina with the ALF who requested update. CSW explained that pt has been seen and psych cleared and is ready for DC back to the facility. CSW asked if they would be picking pt up. Crystal states she is unable to leave the other residents at the facility.  ? ?CSW updated pts RN that pt will need a ride from either [facility] or the cab when they get pt ready for DC. TOC signing off.  ?

## 2022-01-20 ENCOUNTER — Ambulatory Visit (INDEPENDENT_AMBULATORY_CARE_PROVIDER_SITE_OTHER): Payer: Medicaid Other | Admitting: Gastroenterology

## 2022-02-03 ENCOUNTER — Ambulatory Visit (INDEPENDENT_AMBULATORY_CARE_PROVIDER_SITE_OTHER): Payer: Medicaid Other | Admitting: Gastroenterology

## 2022-03-03 ENCOUNTER — Other Ambulatory Visit (INDEPENDENT_AMBULATORY_CARE_PROVIDER_SITE_OTHER): Payer: Self-pay

## 2022-03-03 ENCOUNTER — Ambulatory Visit (INDEPENDENT_AMBULATORY_CARE_PROVIDER_SITE_OTHER): Payer: Medicaid Other | Admitting: Gastroenterology

## 2022-03-03 ENCOUNTER — Encounter (INDEPENDENT_AMBULATORY_CARE_PROVIDER_SITE_OTHER): Payer: Self-pay | Admitting: Gastroenterology

## 2022-03-03 ENCOUNTER — Encounter (INDEPENDENT_AMBULATORY_CARE_PROVIDER_SITE_OTHER): Payer: Self-pay

## 2022-03-03 ENCOUNTER — Telehealth (INDEPENDENT_AMBULATORY_CARE_PROVIDER_SITE_OTHER): Payer: Self-pay

## 2022-03-03 DIAGNOSIS — R634 Abnormal weight loss: Secondary | ICD-10-CM | POA: Diagnosis not present

## 2022-03-03 DIAGNOSIS — K5900 Constipation, unspecified: Secondary | ICD-10-CM

## 2022-03-03 DIAGNOSIS — R1310 Dysphagia, unspecified: Secondary | ICD-10-CM

## 2022-03-03 DIAGNOSIS — R6881 Early satiety: Secondary | ICD-10-CM

## 2022-03-03 DIAGNOSIS — R195 Other fecal abnormalities: Secondary | ICD-10-CM

## 2022-03-03 DIAGNOSIS — R112 Nausea with vomiting, unspecified: Secondary | ICD-10-CM | POA: Diagnosis not present

## 2022-03-03 MED ORDER — PEG 3350-KCL-NA BICARB-NACL 420 G PO SOLR: 4000.0000 mL | ORAL | 0 refills | Status: DC

## 2022-03-03 NOTE — Telephone Encounter (Signed)
Jimy Gates Ann Ferrell Claiborne, CMA  ?

## 2022-03-03 NOTE — Patient Instructions (Signed)
We will check thyroid function as hypothyroidism can cause constipation Please make sure to drink 6-8 glasses of water per day Start benefiber (can be generic) doing 1T with each meal, three times per day to help with constipation We will get you scheduled for upper endoscopy and colonoscopy as well for further evaluation Make sure to chew thoroughly, taking small bites, avoiding thicker, dryer foods and taking sips of liquids between bites  Follow up 3 months

## 2022-03-03 NOTE — Progress Notes (Signed)
Referring Provider: No ref. provider found Primary Care Physician:  Pcp, No Primary GI Physician: new  Chief Complaint  Patient presents with   hosptial follow up     Patient here today for a follow up from recent Ed visit due to constipation. She says she is able to have a bm when she eats peanuts, she says she does take senokot daily she thinks.   HPI:   Regina Watson is a 56 y.o. female with past medical history of anemia, DM, HTN, MDD, neuropathy, schizophrenia   Patient presenting today as a new patient for ED follow up of constipation.  Seen 12/13/21 for chronic constipation. Patient also with hallucinations and reports of group home workers being mean to her. Ultimately, patient required behavioral health assessment during her visit though was cleared to return back to her group home. Advised to have GI follow up for ongoing constipation.  Labs at that time with K+ 3.4, CBC unremarkable, other electrolytes WNL.  Patient arrives with staff member from group home today, History somewhat difficult to obtain as patient is very drowsy, notably having difficulty staying awake during th visit. She reports that she has had constipation for the past 4-5 months. having a BM about 3x/week. she is taking senokot daily, she is unsure if this helps. States stools are still very hard like bricks. endorses that peanuts help her have a BM and make her stools softer. She endorses some mid lower abdominal pain, improved with having a BM. She endorses that she is having nausea and vomiting daily. She thinks she has heartburn sometimes, though seems to be "every now and then." Endorses some occasional dysphagia, maybe a few times per week. She usually drinks a large glass of water with her meal which helps. She denies any episodes of food impaction. She also endorses some pain when she swallows. Having some flatulence, bloating and early satiety. Denies any melena or rectal bleeding.  Weight is 175 lbs  today, she states that previously she was over 200 pounds, maybe around 215 pounds a couple of months ago. Staff at facility states that patient is not eating well as she is constipated.   NSAID VOH:YWVP Social hx: no etoh, tobacco 5-6 cigs per day  Fam hx: unsure   Last Colonoscopy:never  Last Endoscopy:never  Recommendations:    Past Medical History:  Diagnosis Date   Anemia    Diabetes mellitus without complication ([place])    Hypertension    MDD (major depressive disorder)    Neuropathy 2010   Schizophrenia ([place])     Past Surgical History:  Procedure Laterality Date   TOE AMPUTATION      Current Outpatient Medications  Medication Sig Dispense Refill   acetaminophen (TYLENOL) 325 MG tablet Take 650 mg by mouth every 6 (six) hours as needed.     acetaZOLAMIDE ER (DIAMOX) 500 MG capsule Take 500 mg by mouth 2 (two) times daily.     albuterol (VENTOLIN HFA) 108 (90 Base) MCG/ACT inhaler Inhale 2 puffs into the lungs every 4 (four) hours as needed for wheezing or shortness of breath.     amLODipine (NORVASC) 5 MG tablet Take 1 tablet (5 mg total) by mouth daily. 30 tablet 2   aspirin EC 81 MG tablet Take 81 mg by mouth every morning.     atorvastatin (LIPITOR) 10 MG tablet Take 10 mg by mouth every evening.      benztropine (COGENTIN) 0.5 MG tablet Take 0.5 mg by mouth 2 (two)  times daily.     colchicine 0.6 MG tablet Take 0.6 mg by mouth daily.     divalproex (DEPAKOTE ER) 500 MG 24 hr tablet Take 1 tablet (500 mg total) by mouth at bedtime. (Patient taking differently: Take 1,000 mg by mouth at bedtime.) 30 tablet 0   FARXIGA 10 MG TABS tablet Take 10 mg by mouth daily.     furosemide (LASIX) 40 MG tablet Take 1 tablet (40 mg total) by mouth daily as needed for fluid or edema. 30 tablet    gabapentin (NEURONTIN) 400 MG capsule Take 1 capsule (400 mg total) by mouth 3 (three) times daily. (Patient taking differently: Take 400 mg by mouth 4 (four) times daily.) 90 capsule 0    hydrOXYzine (ATARAX) 25 MG tablet Take 25 mg by mouth 3 (three) times daily.     insulin aspart (NOVOLOG FLEXPEN) 100 UNIT/ML FlexPen Inject 11-15 Units into the skin 3 (three) times daily with meals. 150-200=11units,201-250=12units, 251-300=13units, 301-350=14units, 351-400=15units     insulin glargine (LANTUS) 100 UNIT/ML injection Inject 0.14 mLs (14 Units total) into the skin at bedtime. (Patient taking differently: Inject 25 Units into the skin 2 (two) times daily.) 10 mL 11   levocetirizine (XYZAL) 5 MG tablet Take 5 mg by mouth daily.     metFORMIN (GLUCOPHAGE) 1000 MG tablet Take 1,000 mg by mouth 2 (two) times daily with a meal.     polyethylene glycol (MIRALAX / GLYCOLAX) 17 g packet Take 17 g by mouth daily.     QUEtiapine (SEROQUEL XR) 200 MG 24 hr tablet Take 1 tablet (200 mg total) by mouth every evening. (Patient taking differently: Take 800 mg by mouth at bedtime.) 30 tablet 2   risperiDONE (RISPERDAL M-TABS) 3 MG disintegrating tablet Take 3 mg by mouth 2 (two) times daily.     Semaglutide,0.25 or 0.'5MG'$ /DOS, (OZEMPIC, 0.25 OR 0.5 MG/DOSE,) 2 MG/1.5ML SOPN 0.25 mg by Subconjunctival route once a week.     sennosides-docusate sodium (SENOKOT-S) 8.6-50 MG tablet Take 1 tablet by mouth in the morning and at bedtime.     tiotropium (SPIRIVA) 18 MCG inhalation capsule Place 18 mcg into inhaler and inhale daily.     Vitamin D, Ergocalciferol, (DRISDOL) 1.25 MG (50000 UNIT) CAPS capsule Take 50,000 Units by mouth every 7 (seven) days.     No current facility-administered medications for this visit.    Allergies as of 03/03/2022 - Review Complete 12/13/2021  Allergen Reaction Noted   Bactrim [sulfamethoxazole-trimethoprim] Hives 01/27/2016   Penicillins  12/09/2013   Latex Itching 09/27/2012    Family History  Problem Relation Age of Onset   Hypertension Mother    Cancer Mother    Diabetes Father     Social History   Socioeconomic History   Marital status: Widowed    Spouse  name: Not on file   Number of children: Not on file   Years of education: Not on file   Highest education level: Not on file  Occupational History   Not on file  Tobacco Use   Smoking status: Every Day    Packs/day: 1.00    Years: 20.00    Total pack years: 20.00    Types: Cigarettes   Smokeless tobacco: Never  Substance and Sexual Activity   Alcohol use: No   Drug use: No   Sexual activity: Yes    Birth control/protection: None  Other Topics Concern   Not on file  Social History Narrative   epworth scale score 18  Social Determinants of Health   Financial Resource Strain: Not on file  Food Insecurity: Not on file  Transportation Needs: Not on file  Physical Activity: Not on file  Stress: Not on file  Social Connections: Not on file   Review of systems General: negative for malaise, night sweats, fever, chills, +weight loss  Neck: Negative for lumps, goiter, pain and significant neck swelling Resp: Negative for cough, wheezing, dyspnea at rest CV: Negative for chest pain, leg swelling, palpitations, orthopnea GI: denies melena, hematochezia, diarrhea, +constipation +early satiety +dysphagia +odynophagia +nausea +vomiting +weight loss  MSK: Negative for joint pain or swelling, back pain, and muscle pain. Derm: Negative for itching or rash Psych: Denies depression, anxiety, memory loss, confusion. No homicidal or suicidal ideation.  Heme: Negative for prolonged bleeding, bruising easily, and swollen nodes. Endocrine: Negative for cold or heat intolerance, polyuria, polydipsia and goiter. Neuro: negative for tremor, gait imbalance, syncope and seizures. The remainder of the review of systems is noncontributory.  Physical Exam: There were no vitals taken for this visit. General:   Alert and oriented. Patient very somnolent during visit Head:  Normocephalic and atraumatic. Eyes:  Conjuctiva clear without scleral icterus. +exopthalmos Mouth:  Oral mucosa pink and moist.  Good dentition. No lesions. Heart: Normal rate and rhythm, s1 and s2 heart sounds present.  Lungs: Clear lung sounds in all lobes. Respirations equal and unlabored. Abdomen:  +BS, soft, non-tender and non-distended. No rebound or guarding. No HSM or masses noted. Derm: No palmar erythema or jaundice Msk:  Symmetrical without gross deformities. Normal posture. Extremities:  Without edema. Neurologic:  Alert and  oriented x4 Psych:  Alert and cooperative. somnolent  Invalid input(s): "6 MONTHS"   ASSESSMENT: Regina Watson is a 56 y.o. female presenting today as a new patient for constipation, also with nausea/vomiting, early satiety and weight loss.   Constipation: ongoing for the past few months, having a BM maybe 3 times per week with very hard stools, feels that peanuts soften her stools some. Taking senokot daily but unsure if this is providing relief. No rectal bleeding or melena, feels that appetite is less and she is having early satiety since being constipated. Notably on multiple psychiatric medications which could likely be influencing this. Will check TSH to rule out underlying thyroid contribution. Encouraged her to increase water intake, fruits, veggies, whole grains and add Benefiber 1T TID with meals. Notably she has never had a colonoscopy for CRC screening, she does not know her family history. We discussed indications for screening colonoscopy to which patient is amenable.  Nausea/vomiting/early satiety: again symptoms could be related to multiple psychiatric medications, however, given her significant weight loss of approx 30 lbs in the past couple of months, along with above mentioned symptoms, discussed proceeding with EGD as we cannot rule out PUD, duodenitis, gastritis, or less likely, malignancy.   Dysphagia: occurring a few times per week, no episodes of food impaction. Cannot rule out esophageal ring, web, stricture, stenosis or malignancy, will proceed with EGD for  further evaluation, as above.   Indications, risks and benefits of procedure discussed in detail with patient. Patient verbalized understanding and is in agreement to proceed with EGD/Colonoscopy at this time.    PLAN:  Benefiber 1T TID with meals  2. Increase water intake 6-8 glasses per day 3. Colonoscopy and EGD-ENDO 3, 2 day prep 4. TSH  5. Diet high in fruits, veggies and whole grains  All questions were answered, patient verbalized understanding and is in agreement  with plan as outlined above.   Follow Up: 3 months   Regina Partch L. Alver Sorrow, MSN, APRN, AGNP-C Adult-Gerontology Nurse Practitioner [facility]

## 2022-03-04 ENCOUNTER — Encounter (INDEPENDENT_AMBULATORY_CARE_PROVIDER_SITE_OTHER): Payer: Self-pay

## 2022-03-04 LAB — TSH: TSH: 1.01 mIU/L

## 2022-03-05 DIAGNOSIS — R1310 Dysphagia, unspecified: Secondary | ICD-10-CM | POA: Insufficient documentation

## 2022-03-23 ENCOUNTER — Ambulatory Visit (INDEPENDENT_AMBULATORY_CARE_PROVIDER_SITE_OTHER): Payer: Medicaid Other | Admitting: Podiatry

## 2022-03-23 ENCOUNTER — Encounter: Payer: Self-pay | Admitting: Podiatry

## 2022-03-23 ENCOUNTER — Ambulatory Visit: Payer: Medicaid Other | Admitting: Podiatry

## 2022-03-23 DIAGNOSIS — M79674 Pain in right toe(s): Secondary | ICD-10-CM | POA: Diagnosis not present

## 2022-03-23 DIAGNOSIS — B351 Tinea unguium: Secondary | ICD-10-CM

## 2022-03-23 DIAGNOSIS — E119 Type 2 diabetes mellitus without complications: Secondary | ICD-10-CM

## 2022-03-23 DIAGNOSIS — M79675 Pain in left toe(s): Secondary | ICD-10-CM | POA: Diagnosis not present

## 2022-03-23 NOTE — Progress Notes (Signed)
  Subjective:  Patient ID: Regina Watson, female    DOB: 12-19-1965,  MRN: [id number]  Chief Complaint  Patient presents with   Nail Problem    Nail trim    56 y.o. female returns for the above complaint.  Patient presents with thickened elongated dystrophic toenails x10 mild pain on palpation.  Patient states it hurts with ambulation.  She would like to have them debrided down she is not able to do it herself.  She is a diabetic with unknown A1c  Objective:  There were no vitals filed for this visit. Podiatric Exam: Vascular: dorsalis pedis and posterior tibial pulses are palpable bilateral. Capillary return is immediate. Temperature gradient is WNL. Skin turgor WNL  Sensorium: Normal Semmes Weinstein monofilament test. Normal tactile sensation bilaterally. Nail Exam: Pt has thick disfigured discolored nails with subungual debris noted bilateral entire nail hallux through fifth toenails.  Pain on palpation to the nails. Ulcer Exam: There is no evidence of ulcer or pre-ulcerative changes or infection. Orthopedic Exam: Muscle tone and strength are WNL. No limitations in general ROM. No crepitus or effusions noted.  Skin: No Porokeratosis. No infection or ulcers    Assessment & Plan:   1. Pain due to onychomycosis of toenails of both feet   2. Type 2 diabetes mellitus without complication, unspecified whether long term insulin use ([place])     Patient was evaluated and treated and all questions answered.  Onychomycosis with pain  -Nails palliatively debrided as below. -Educated on self-care  Procedure: Nail Debridement Rationale: pain  Type of Debridement: manual, sharp debridement. Instrumentation: Nail nipper, rotary burr. Number of Nails: 10  Procedures and Treatment: Consent by patient was obtained for treatment procedures. The patient understood the discussion of treatment and procedures well. All questions were answered thoroughly reviewed. Debridement of mycotic and  hypertrophic toenails, 1 through 5 bilateral and clearing of subungual debris. No ulceration, no infection noted.  Return Visit-Office Procedure: Patient instructed to return to the office for a follow up visit 3 months for continued evaluation and treatment.  Boneta Lucks, DPM    Return in about 3 months (around 06/23/2022) for MAyer .

## 2022-03-30 ENCOUNTER — Ambulatory Visit: Payer: Medicaid Other | Admitting: Podiatry

## 2022-04-05 NOTE — Patient Instructions (Signed)
Regina Watson  04/05/2022     '@PREFPERIOPPHARMACY'$ @   Your procedure is scheduled on  04/12/2022.   Report to [facility] at  1015  A.M.   Call this number if you have problems the morning of surgery:  [phone]   Remember:  Follow the diet and prep instructions given to you by the office.    Take 7 units of your night time insulin the night before you procedure.     DO NOT take any medications for diabetes the morning of your procedure.    Use your inhaler before you come and bring your rescue inhaler with you.     Take these medicines the morning of surgery with A SIP OF WATER                      norvasc, cogentin, colchicine, neurontin, atara,(if needed), xyzal, risperdal.     Do not wear jewelry, make-up or nail polish.  Do not wear lotions, powders, or perfumes, or deodorant.  Do not shave 48 hours prior to surgery.  Men may shave face and neck.  Do not bring valuables to the hospital.  [facility] is not responsible for any belongings or valuables.  Contacts, dentures or bridgework may not be worn into surgery.  Leave your suitcase in the car.  After surgery it may be brought to your room.  For patients admitted to the hospital, discharge time will be determined by your treatment team.  Patients discharged the day of surgery will not be allowed to drive home and must have someone with them for 24 hours.    Special instructions:   DO NOT smoke tobacco or vape fore 24 hours before your procedure.  Please read over the following fact sheets that you were given. Anesthesia Post-op Instructions and Care and Recovery After Surgery      Upper Endoscopy, Adult, Care After After the procedure, it is common to have a sore throat. It is also common to have: Mild stomach pain or discomfort. Bloating. Nausea. Follow these instructions at home: The instructions below may help you care for yourself at home. Your health care provider may give you more  instructions. If you have questions, ask your health care provider. If you were given a sedative during the procedure, it can affect you for several hours. Do not drive or operate machinery until your health care provider says that it is safe. If you will be going home right after the procedure, plan to have a responsible adult: Take you home from the hospital or clinic. You will not be allowed to drive. Care for you for the time you are told. Follow instructions from your health care provider about what you may eat and drink. Return to your normal activities as told by your health care provider. Ask your health care provider what activities are safe for you. Take over-the-counter and prescription medicines only as told by your health care provider. Contact a health care provider if you: Have a sore throat that lasts longer than one day. Have trouble swallowing. Have a fever. Get help right away if you: Vomit blood or your vomit looks like coffee grounds. Have bloody, black, or tarry stools. Have a very bad sore throat or you cannot swallow. Have difficulty breathing or very bad pain in your chest or abdomen. These symptoms may be an emergency. Get help right away. Call 911. Do not wait to see if the  symptoms will go away. Do not drive yourself to the hospital. Summary After the procedure, it is common to have a sore throat, mild stomach discomfort, bloating, and nausea. If you were given a sedative during the procedure, it can affect you for several hours. Do not drive until your health care provider says that it is safe. Follow instructions from your health care provider about what you may eat and drink. Return to your normal activities as told by your health care provider. This information is not intended to replace advice given to you by your health care provider. Make sure you discuss any questions you have with your health care provider. Document Revised: 11/17/2021 Document Reviewed:  11/17/2021 Elsevier Patient Education  [place]. Colonoscopy, Adult, Care After The following information offers guidance on how to care for yourself after your procedure. Your health care provider may also give you more specific instructions. If you have problems or questions, contact your health care provider. What can I expect after the procedure? After the procedure, it is common to have: A small amount of blood in your stool for 24 hours after the procedure. Some gas. Mild cramping or bloating of your abdomen. Follow these instructions at home: Eating and drinking  Drink enough fluid to keep your urine pale yellow. Follow instructions from your health care provider about eating or drinking restrictions. Resume your normal diet as told by your health care provider. Avoid heavy or fried foods that are hard to digest. Activity Rest as told by your health care provider. Avoid sitting for a long time without moving. Get up to take short walks every 1-2 hours. This is important to improve blood flow and breathing. Ask for help if you feel weak or unsteady. Return to your normal activities as told by your health care provider. Ask your health care provider what activities are safe for you. Managing cramping and bloating  Try walking around when you have cramps or feel bloated. If directed, apply heat to your abdomen as told by your health care provider. Use the heat source that your health care provider recommends, such as a moist heat pack or a heating pad. Place a towel between your skin and the heat source. Leave the heat on for 20-30 minutes. Remove the heat if your skin turns bright red. This is especially important if you are unable to feel pain, heat, or cold. You have a greater risk of getting burned. General instructions If you were given a sedative during the procedure, it can affect you for several hours. Do not drive or operate machinery until your health care provider  says that it is safe. For the first 24 hours after the procedure: Do not sign important documents. Do not drink alcohol. Do your regular daily activities at a slower pace than normal. Eat soft foods that are easy to digest. Take over-the-counter and prescription medicines only as told by your health care provider. Keep all follow-up visits. This is important. Contact a health care provider if: You have blood in your stool 2-3 days after the procedure. Get help right away if: You have more than a small spotting of blood in your stool. You have large blood clots in your stool. You have swelling of your abdomen. You have nausea or vomiting. You have a fever. You have increasing pain in your abdomen that is not relieved with medicine. These symptoms may be an emergency. Get help right away. Call 911. Do not wait to see if the  symptoms will go away. Do not drive yourself to the hospital. Summary After the procedure, it is common to have a small amount of blood in your stool. You may also have mild cramping and bloating of your abdomen. If you were given a sedative during the procedure, it can affect you for several hours. Do not drive or operate machinery until your health care provider says that it is safe. Get help right away if you have a lot of blood in your stool, nausea or vomiting, a fever, or increased pain in your abdomen. This information is not intended to replace advice given to you by your health care provider. Make sure you discuss any questions you have with your health care provider. Document Revised: 03/31/2021 Document Reviewed: 03/31/2021 Elsevier Patient Education  [place] After This sheet gives you information about how to care for yourself after your procedure. Your health care provider may also give you more specific instructions. If you have problems or questions, contact your health care provider. What can I expect after the  procedure? After the procedure, it is common to have: Tiredness. Forgetfulness about what happened after the procedure. Impaired judgment for important decisions. Nausea or vomiting. Some difficulty with balance. Follow these instructions at home: For the time period you were told by your health care provider:     Rest as needed. Do not participate in activities where you could fall or become injured. Do not drive or use machinery. Do not drink alcohol. Do not take sleeping pills or medicines that cause drowsiness. Do not make important decisions or sign legal documents. Do not take care of children on your own. Eating and drinking Follow the diet that is recommended by your health care provider. Drink enough fluid to keep your urine pale yellow. If you vomit: Drink water, juice, or soup when you can drink without vomiting. Make sure you have little or no nausea before eating solid foods. General instructions Have a responsible adult stay with you for the time you are told. It is important to have someone help care for you until you are awake and alert. Take over-the-counter and prescription medicines only as told by your health care provider. If you have sleep apnea, surgery and certain medicines can increase your risk for breathing problems. Follow instructions from your health care provider about wearing your sleep device: Anytime you are sleeping, including during daytime naps. While taking prescription pain medicines, sleeping medicines, or medicines that make you drowsy. Avoid smoking. Keep all follow-up visits as told by your health care provider. This is important. Contact a health care provider if: You keep feeling nauseous or you keep vomiting. You feel light-headed. You are still sleepy or having trouble with balance after 24 hours. You develop a rash. You have a fever. You have redness or swelling around the IV site. Get help right away if: You have trouble  breathing. You have new-onset confusion at home. Summary For several hours after your procedure, you may feel tired. You may also be forgetful and have poor judgment. Have a responsible adult stay with you for the time you are told. It is important to have someone help care for you until you are awake and alert. Rest as told. Do not drive or operate machinery. Do not drink alcohol or take sleeping pills. Get help right away if you have trouble breathing, or if you suddenly become confused. This information is not intended to replace advice given to  you by your health care provider. Make sure you discuss any questions you have with your health care provider. Document Revised: 07/13/2021 Document Reviewed: 07/11/2019 Elsevier Patient Education  [place].

## 2022-04-07 ENCOUNTER — Encounter (HOSPITAL_COMMUNITY): Payer: Self-pay

## 2022-04-07 ENCOUNTER — Encounter (HOSPITAL_COMMUNITY)
Admission: RE | Admit: 2022-04-07 | Discharge: 2022-04-07 | Disposition: A | Discharge status: Home / Self Care | Payer: Medicaid Other | Source: Ambulatory Visit | Attending: Gastroenterology | Admitting: Gastroenterology

## 2022-04-07 VITALS — Ht 69.0 in | Wt 200.6 lb

## 2022-04-07 DIAGNOSIS — R1310 Dysphagia, unspecified: Secondary | ICD-10-CM

## 2022-04-07 DIAGNOSIS — R195 Other fecal abnormalities: Secondary | ICD-10-CM

## 2022-04-07 DIAGNOSIS — R6881 Early satiety: Secondary | ICD-10-CM

## 2022-04-07 DIAGNOSIS — Z01818 Encounter for other preprocedural examination: Secondary | ICD-10-CM

## 2022-04-11 ENCOUNTER — Other Ambulatory Visit: Payer: Self-pay

## 2022-04-11 ENCOUNTER — Encounter (HOSPITAL_COMMUNITY): Payer: Self-pay

## 2022-04-11 NOTE — Pre-Procedure Instructions (Signed)
Patient was a no show for PAT on 8/17. I messaged Regina Watson that patient was a no show. Regina Watson contacted Regina Sons, RN today on 04/11/2022 that patient was in an Assisted Living facililty and that is why she did not show. Patient has sister Regina Watson listed as POA. I called Regina Watson and she states that Regina Watson is now Regina Watson. I contacted Regina Watson at [facility], who is the patients daily caregiver. She gave me Regina Watson number of [phone].and [facility] supervisor, Regina Watson- [phone], I left message on both phones that it is urgent that we contact them to get verbal consent for this patinet or her procedure would be canceled. I left information that her arrival time was 0600 with procedure time of 0730 in the morning.

## 2022-04-12 ENCOUNTER — Encounter (HOSPITAL_COMMUNITY): Payer: Self-pay | Admitting: Gastroenterology

## 2022-04-12 ENCOUNTER — Ambulatory Visit (HOSPITAL_COMMUNITY)
Admission: RE | Admit: 2022-04-12 | Discharge: 2022-04-12 | Disposition: A | Discharge status: Home / Self Care | Payer: Medicaid Other | Source: Ambulatory Visit | Attending: Gastroenterology | Admitting: Gastroenterology

## 2022-04-12 ENCOUNTER — Ambulatory Visit (HOSPITAL_BASED_OUTPATIENT_CLINIC_OR_DEPARTMENT_OTHER): Payer: Medicaid Other | Admitting: Anesthesiology

## 2022-04-12 ENCOUNTER — Encounter (HOSPITAL_COMMUNITY)
Admission: RE | Disposition: A | Discharge status: Home / Self Care | Payer: Self-pay | Source: Ambulatory Visit | Attending: Gastroenterology

## 2022-04-12 ENCOUNTER — Ambulatory Visit (HOSPITAL_COMMUNITY): Payer: Medicaid Other | Admitting: Anesthesiology

## 2022-04-12 DIAGNOSIS — Z794 Long term (current) use of insulin: Secondary | ICD-10-CM | POA: Insufficient documentation

## 2022-04-12 DIAGNOSIS — D649 Anemia, unspecified: Secondary | ICD-10-CM | POA: Diagnosis not present

## 2022-04-12 DIAGNOSIS — K259 Gastric ulcer, unspecified as acute or chronic, without hemorrhage or perforation: Secondary | ICD-10-CM | POA: Diagnosis not present

## 2022-04-12 DIAGNOSIS — F17200 Nicotine dependence, unspecified, uncomplicated: Secondary | ICD-10-CM | POA: Diagnosis not present

## 2022-04-12 DIAGNOSIS — I10 Essential (primary) hypertension: Secondary | ICD-10-CM | POA: Insufficient documentation

## 2022-04-12 DIAGNOSIS — R6881 Early satiety: Secondary | ICD-10-CM

## 2022-04-12 DIAGNOSIS — E1140 Type 2 diabetes mellitus with diabetic neuropathy, unspecified: Secondary | ICD-10-CM | POA: Diagnosis not present

## 2022-04-12 DIAGNOSIS — K2289 Other specified disease of esophagus: Secondary | ICD-10-CM | POA: Diagnosis not present

## 2022-04-12 DIAGNOSIS — Z7984 Long term (current) use of oral hypoglycemic drugs: Secondary | ICD-10-CM | POA: Insufficient documentation

## 2022-04-12 DIAGNOSIS — F17210 Nicotine dependence, cigarettes, uncomplicated: Secondary | ICD-10-CM

## 2022-04-12 DIAGNOSIS — K5900 Constipation, unspecified: Secondary | ICD-10-CM | POA: Diagnosis present

## 2022-04-12 DIAGNOSIS — E119 Type 2 diabetes mellitus without complications: Secondary | ICD-10-CM

## 2022-04-12 DIAGNOSIS — R195 Other fecal abnormalities: Secondary | ICD-10-CM

## 2022-04-12 DIAGNOSIS — Z01818 Encounter for other preprocedural examination: Secondary | ICD-10-CM

## 2022-04-12 DIAGNOSIS — R1310 Dysphagia, unspecified: Secondary | ICD-10-CM | POA: Diagnosis not present

## 2022-04-12 DIAGNOSIS — R1013 Epigastric pain: Secondary | ICD-10-CM | POA: Diagnosis not present

## 2022-04-12 HISTORY — PX: COLONOSCOPY WITH PROPOFOL: SHX5780

## 2022-04-12 HISTORY — PX: BIOPSY: SHX5522

## 2022-04-12 HISTORY — PX: ESOPHAGOGASTRODUODENOSCOPY (EGD) WITH PROPOFOL: SHX5813

## 2022-04-12 LAB — GLUCOSE, CAPILLARY: Glucose-Capillary: 71 mg/dL (ref 70–99)

## 2022-04-12 LAB — POCT PREGNANCY, URINE: Preg Test, Ur: NEGATIVE

## 2022-04-12 SURGERY — COLONOSCOPY WITH PROPOFOL
Anesthesia: General

## 2022-04-12 MED ORDER — LIDOCAINE HCL (CARDIAC) PF 100 MG/5ML IV SOSY
PREFILLED_SYRINGE | INTRAVENOUS | Status: DC | PRN
  Administered 2022-04-12: 50 mg via INTRAVENOUS

## 2022-04-12 MED ORDER — PROPOFOL 500 MG/50ML IV EMUL
INTRAVENOUS | Status: AC
  Filled 2022-04-12: qty 100

## 2022-04-12 MED ORDER — PROPOFOL 10 MG/ML IV BOLUS
INTRAVENOUS | Status: DC | PRN
  Administered 2022-04-12: 50 mg via INTRAVENOUS

## 2022-04-12 MED ORDER — SUCRALFATE 1 G PO TABS: 1.0000 g | ORAL_TABLET | Freq: Two times a day (BID) | ORAL | 0 refills | Status: DC

## 2022-04-12 MED ORDER — OMEPRAZOLE 40 MG PO CPDR: 40.0000 mg | DELAYED_RELEASE_CAPSULE | Freq: Every day | ORAL | 2 refills | Status: DC

## 2022-04-12 MED ORDER — LACTATED RINGERS IV SOLN: INTRAVENOUS | Status: DC

## 2022-04-12 MED ORDER — PROPOFOL 500 MG/50ML IV EMUL
INTRAVENOUS | Status: DC | PRN
  Administered 2022-04-12: 150 ug/kg/min via INTRAVENOUS

## 2022-04-12 MED ORDER — PROPOFOL 500 MG/50ML IV EMUL
INTRAVENOUS | Status: AC
  Filled 2022-04-12: qty 50

## 2022-04-12 NOTE — H&P (Signed)
Regina Watson is an 56 y.o. female.   Chief Complaint: constipation, nausea, vomiting and dysphagia HPI: Regina Watson is a 56 y.o. female with past medical history of anemia, DM, HTN, MDD, neuropathy, schizophrenia, coming for evaluation of constipation, nausea, vomiting and dysphagia.  Patient reports feeling well.  States she has been able to move her bowels more frequently.  Denies any abdominal distention although she has some epigastric abdominal pain.  No reports of dysphagia or nausea/vomiting.  Past Medical History:  Diagnosis Date   Anemia    Diabetes mellitus without complication ([place])    Hypertension    MDD (major depressive disorder)    Neuropathy 2010   Schizophrenia ([place])     Past Surgical History:  Procedure Laterality Date   TOE AMPUTATION      Family History  Problem Relation Age of Onset   Hypertension Mother    Cancer Mother    Diabetes Father    Social History:  reports that she has been smoking cigarettes. She has a 20.00 pack-year smoking history. She has never used smokeless tobacco. She reports that she does not drink alcohol and does not use drugs.  Allergies:  Allergies  Allergen Reactions   Bactrim [Sulfamethoxazole-Trimethoprim] Hives   Penicillins     Has patient had a PCN reaction causing immediate rash, facial/tongue/throat swelling, SOB or lightheadedness with hypotension: UNKNOWN Has patient had a PCN reaction causing severe rash involving mucus membranes or skin necrosis: UNKNOWN Has patient had a PCN reaction that required hospitalization: UNKNOWN Has patient had a PCN reaction occurring within the last 10 years: UNKNOWN If all of the above answers are "NO", then may proceed with Cephalosporin use.    Latex Itching    Medications Prior to Admission  Medication Sig Dispense Refill   acetaminophen (TYLENOL) 325 MG tablet Take 650 mg by mouth every 6 (six) hours as needed for mild pain or moderate pain.     acetaZOLAMIDE ER  (DIAMOX) 500 MG capsule Take 500 mg by mouth 2 (two) times daily.     albuterol (VENTOLIN HFA) 108 (90 Base) MCG/ACT inhaler Inhale 2 puffs into the lungs every 4 (four) hours as needed for wheezing or shortness of breath.     amLODipine (NORVASC) 5 MG tablet Take 1 tablet (5 mg total) by mouth daily. 30 tablet 2   aspirin EC 81 MG tablet Take 81 mg by mouth every morning.     atorvastatin (LIPITOR) 10 MG tablet Take 10 mg by mouth every evening.      benztropine (COGENTIN) 0.5 MG tablet Take 0.5 mg by mouth 2 (two) times daily.     colchicine 0.6 MG tablet Take 0.6 mg by mouth daily.     divalproex (DEPAKOTE ER) 500 MG 24 hr tablet Take 1,000 mg by mouth at bedtime.     FARXIGA 10 MG TABS tablet Take 10 mg by mouth daily.     furosemide (LASIX) 40 MG tablet Take 1 tablet (40 mg total) by mouth daily as needed for fluid or edema. 30 tablet    gabapentin (NEURONTIN) 400 MG capsule Take 1 capsule (400 mg total) by mouth 3 (three) times daily. (Patient taking differently: Take 400 mg by mouth 4 (four) times daily.) 90 capsule 0   hydrOXYzine (ATARAX) 25 MG tablet Take 25 mg by mouth 3 (three) times daily.     insulin aspart (NOVOLOG FLEXPEN) 100 UNIT/ML FlexPen Inject 11-15 Units into the skin 3 (three) times daily with meals. 150-200=11units,201-250=12units, 251-300=13units, 301-350=14units,  351-400=15units     insulin glargine (LANTUS) 100 UNIT/ML injection Inject 0.14 mLs (14 Units total) into the skin at bedtime. (Patient taking differently: Inject 25 Units into the skin 2 (two) times daily.) 10 mL 11   levocetirizine (XYZAL) 5 MG tablet Take 5 mg by mouth daily.     metFORMIN (GLUCOPHAGE) 1000 MG tablet Take 1,000 mg by mouth 2 (two) times daily with a meal.     Methylcellulose, Laxative, (GOODSENSE FIBER PO) Take 2 Scoops by mouth 3 (three) times daily. 2 tspn into 4-8 ounces of beverage     QUEtiapine (SEROQUEL XR) 400 MG 24 hr tablet Take 800 mg by mouth at bedtime.     risperiDONE (RISPERDAL  M-TABS) 3 MG disintegrating tablet Take 3 mg by mouth 2 (two) times daily.     Semaglutide,0.25 or 0.'5MG'$ /DOS, (OZEMPIC, 0.25 OR 0.5 MG/DOSE,) 2 MG/1.5ML SOPN 0.25 mg by Subconjunctival route every Friday.     tiotropium (SPIRIVA) 18 MCG inhalation capsule Place 18 mcg into inhaler and inhale daily.     traMADol (ULTRAM) 50 MG tablet Take 50 mg by mouth every 6 (six) hours as needed for severe pain.     Vitamin D, Ergocalciferol, (DRISDOL) 1.25 MG (50000 UNIT) CAPS capsule Take 50,000 Units by mouth every Saturday.     polyethylene glycol (MIRALAX / GLYCOLAX) 17 g packet Take 17 g by mouth daily.     polyethylene glycol-electrolytes (TRILYTE) 420 g solution Take 4,000 mLs by mouth as directed. 4000 mL 0   sennosides-docusate sodium (SENOKOT-S) 8.6-50 MG tablet Take 1 tablet by mouth in the morning and at bedtime.      Results for orders placed or performed during the hospital encounter of 04/12/22 (from the past 48 hour(s))  Glucose, capillary     Status: None   Collection Time: 04/12/22  7:14 AM  Result Value Ref Range   Glucose-Capillary 71 70 - 99 mg/dL    Comment: Glucose reference range applies only to samples taken after fasting for at least 8 hours.  Pregnancy, urine POC     Status: None   Collection Time: 04/12/22  7:16 AM  Result Value Ref Range   Preg Test, Ur NEGATIVE NEGATIVE    Comment:        THE SENSITIVITY OF THIS METHODOLOGY IS >24 mIU/mL    No results found.  Review of Systems  HR 69 BP 139/82 RR 16 Physical Exam  GENERAL: The patient is AO x3, in no acute distress. HEENT: Head is normocephalic and atraumatic. EOMI are intact. Mouth is well hydrated and without lesions. NECK: Supple. No masses LUNGS: Clear to auscultation. No presence of rhonchi/wheezing/rales. Adequate chest expansion HEART: RRR, normal s1 and s2. ABDOMEN: Soft, nontender, no guarding, no peritoneal signs, and nondistended. BS +. No masses. EXTREMITIES: Without any cyanosis, clubbing, rash,  lesions or edema. NEUROLOGIC: AOx3, no focal motor deficit. SKIN: no jaundice, no rashes  Assessment/Plan Regina Watson is a 56 y.o. female with past medical history of anemia, DM, HTN, MDD, neuropathy, schizophrenia, coming for evaluation of constipation, nausea, vomiting and dysphagia.  We will proceed with EGD and colonoscopy.  Harvel Quale, MD 04/12/2022, 7:34 AM

## 2022-04-12 NOTE — Op Note (Addendum)
[facility] Patient Name: Regina Watson Procedure Date: 04/12/2022 7:23 AM MRN: [id number] Date of Birth: 14-Jan-1966 Attending MD: Maylon Peppers ,  CSN: [id number] Age: 56 Admit Type: Outpatient Procedure:                Colonoscopy Indications:              Constipation Providers:                Maylon Peppers, Lambert Mody, Caprice Kluver Referring MD:              Medicines:                Monitored Anesthesia Care Complications:            No immediate complications. Estimated Blood Loss:     Estimated blood loss: none. Procedure:                Pre-Anesthesia Assessment:                           - Prior to the procedure, a History and Physical                            was performed, and patient medications, allergies                            and sensitivities were reviewed. The patient's                            tolerance of previous anesthesia was reviewed.                           - The risks and benefits of the procedure and the                            sedation options and risks were discussed with the                            patient. All questions were answered and informed                            consent was obtained.                           - ASA Grade Assessment: III - A patient with severe                            systemic disease.                           After obtaining informed consent, the colonoscope                            was passed under direct vision. Throughout the                            procedure, the patient's blood pressure, pulse, and  oxygen saturations were monitored continuously. The                            [id number]-HQ190L ([id number]) scope was introduced through                            the anus and advanced to the the cecum, identified                            by appendiceal orifice and ileocecal valve. The                            colonoscopy was technically difficult and complex                             due to inadequate bowel prep. The patient tolerated                            the procedure well. The quality of the bowel                            preparation was inadequate. Scope In: 8:19:45 AM Scope Out: 8:49:50 AM Scope Withdrawal Time: 0 hours 9 minutes 23 seconds  Total Procedure Duration: 0 hours 30 minutes 5 seconds  Findings:      The perianal and digital rectal examinations were normal.      A large amount of semi-solid stool was found in the entire colon,       interfering with visualization, especially in the right side of the       colon. I attempted to do a thorough evaluation of the colon as thorough       as possible but given bowel prep there are areas that could have polyps       or lesions. No large masses or overt bleeding was seen.      The retroflexed view of the distal rectum and anal verge was normal and       showed no anal or rectal abnormalities. Impression:               - Preparation of the colon was inadequate.                           - Stool in the entire examined colon.                           - The distal rectum and anal verge are normal on                            retroflexion view.                           - No specimens collected. Moderate Sedation:      Per Anesthesia Care Recommendation:           - Discharge patient to home (ambulatory).                           -  Resume previous diet.                           - Repeat colonoscopy in 6 weeks for screening                            purposes - will need a 2 day prep and stay on                            liquid diet for 5 days.                           - Continue present medications.                           Note: findings communicated to [place] (caregiver) and                            Nevin Bloodgood (legal guardian) Procedure Code(s):        --- Professional ---                           [phone], Colonoscopy, flexible; diagnostic, including                             collection of specimen(s) by brushing or washing,                            when performed (separate procedure) Diagnosis Code(s):        --- Professional ---                           K59.00, Constipation, unspecified CPT copyright 2019 American Medical Association. All rights reserved. The codes documented in this report are preliminary and upon coder review may  be revised to meet current compliance requirements. Maylon Peppers, MD Maylon Peppers,  04/12/2022 8:56:08 AM This report has been signed electronically. Number of Addenda: 0

## 2022-04-12 NOTE — Discharge Instructions (Addendum)
You are being discharged to home.  Resume your previous diet.  We are waiting for your pathology results.  Take Prilosec (omeprazole) 40 mg by mouth twice a day.  Take Carafate (sucralfate) tablets 1 gram by mouth twice a day for four weeks.  Your physician has recommended a repeat upper endoscopy in six weeks for surveillance.  Do not take any ibuprofen (including Advil, Motrin or Nuprin), naproxen, or other non-steroidal anti-inflammatory drugs.  Your physician has recommended a repeat colonoscopy in six weeks for screening purposes - will need a 2 day prep and stay on liquid diet for 5 days.  Continue your present medications.

## 2022-04-12 NOTE — Transfer of Care (Signed)
Immediate Anesthesia Transfer of Care Note  Patient: Regina Watson  Procedure(s) Performed: COLONOSCOPY WITH PROPOFOL ESOPHAGOGASTRODUODENOSCOPY (EGD) WITH PROPOFOL BIOPSY  Patient Location: PACU  Anesthesia Type:General  Level of Consciousness: awake, drowsy and patient cooperative  Airway & Oxygen Therapy: Patient Spontanous Breathing  Post-op Assessment: Report given to RN, Post -op Vital signs reviewed and stable and Patient moving all extremities X 4  Post vital signs: Reviewed and stable  Last Vitals:  Vitals Value Taken Time  BP 115/71 04/12/22 0855  Temp    Pulse 90 04/12/22 0856  Resp 13 04/12/22 0856  SpO2 99 % 04/12/22 0856  Vitals shown include unvalidated device data.  Last Pain:  Vitals:   04/12/22 0753  PainSc: 6          Complications: No notable events documented.

## 2022-04-12 NOTE — Anesthesia Preprocedure Evaluation (Signed)
Anesthesia Evaluation  Patient identified by MRN, date of birth, ID band Patient awake    Reviewed: Allergy & Precautions, H&P , NPO status , Patient's Chart, lab work & pertinent test results, reviewed documented beta blocker date and time   Airway Mallampati: II  TM Distance: >3 FB Neck ROM: full    Dental no notable dental hx.    Pulmonary neg pulmonary ROS, Current Smoker,    Pulmonary exam normal breath sounds clear to auscultation       Cardiovascular Exercise Tolerance: Good hypertension, negative cardio ROS   Rhythm:regular Rate:Normal     Neuro/Psych PSYCHIATRIC DISORDERS Depression Schizophrenia negative neurological ROS     GI/Hepatic negative GI ROS, Neg liver ROS,   Endo/Other  negative endocrine ROSdiabetes  Renal/GU negative Renal ROS  negative genitourinary   Musculoskeletal   Abdominal   Peds  Hematology  (+) Blood dyscrasia, anemia ,   Anesthesia Other Findings   Reproductive/Obstetrics negative OB ROS                             Anesthesia Physical Anesthesia Plan  ASA: 3  Anesthesia Plan: General   Post-op Pain Management:    Induction:   PONV Risk Score and Plan: Propofol infusion  Airway Management Planned:   Additional Equipment:   Intra-op Plan:   Post-operative Plan:   Informed Consent: I have reviewed the patients History and Physical, chart, labs and discussed the procedure including the risks, benefits and alternatives for the proposed anesthesia with the patient or authorized representative who has indicated his/her understanding and acceptance.     Dental Advisory Given  Plan Discussed with: CRNA  Anesthesia Plan Comments:         Anesthesia Quick Evaluation

## 2022-04-12 NOTE — Op Note (Signed)
[facility] Patient Name: Regina Watson Procedure Date: 04/12/2022 7:24 AM MRN: [id number] Date of Birth: Apr 20, 1966 Attending MD: Maylon Peppers ,  CSN: [id number] Age: 56 Admit Type: Outpatient Procedure:                Upper GI endoscopy Indications:              Epigastric abdominal pain, Dysphagia, Nausea with                            vomiting Providers:                Maylon Peppers, Lambert Mody, Caprice Kluver Referring MD:              Medicines:                Monitored Anesthesia Care Complications:            No immediate complications. Estimated Blood Loss:     Estimated blood loss: none. Procedure:                Pre-Anesthesia Assessment:                           - Prior to the procedure, a History and Physical                            was performed, and patient medications, allergies                            and sensitivities were reviewed. The patient's                            tolerance of previous anesthesia was reviewed.                           - The risks and benefits of the procedure and the                            sedation options and risks were discussed with the                            patient. All questions were answered and informed                            consent was obtained.                           - ASA Grade Assessment: III - A patient with severe                            systemic disease.                           After obtaining informed consent, the endoscope was                            passed under direct vision. Throughout the  procedure, the patient's blood pressure, pulse, and                            oxygen saturations were monitored continuously. The                            [id number]-H190 ([id number]) scope was introduced through the                            mouth, and advanced to the second part of duodenum.                            The upper GI endoscopy was accomplished  without                            difficulty. The patient tolerated the procedure                            well. Scope In: 8:00:43 AM Scope Out: 8:11:05 AM Total Procedure Duration: 0 hours 10 minutes 22 seconds  Findings:      No endoscopic abnormality was evident in the esophagus to explain the       patient's complaint of dysphagia.      The Z-line was irregular and was found 38 cm from the incisors.      Two non-bleeding cratered gastric ulcers with pigmented material were       found at the incisura. The largest lesion was 30 mm in largest dimension       and had a eschar , smallest was close to 5 mm. Biopsies were taken with       a cold forceps for histology from the ulcer edge multiple times.       Biopsies from body and antrum were taken with a cold forceps for       Helicobacter pylori testing.      The examined duodenum was normal. Impression:               - No endoscopic esophageal abnormality to explain                            patient's dysphagia.                           - Z-line irregular, 38 cm from the incisors.                           - Non-bleeding gastric ulcers with pigmented                            material. Biopsied.                           - Normal examined duodenum. Moderate Sedation:      Per Anesthesia Care Recommendation:           - Discharge patient to home (ambulatory).                           -  Resume previous diet.                           - Await pathology results.                           - Use Prilosec (omeprazole) 40 mg PO BID.                           - Use sucralfate tablets 1 gram PO BID for 4 weeks.                           - Repeat upper endoscopy in 6 weeks for                            surveillance.                           - H. pylori serology                           - No ibuprofen, naproxen, or other non-steroidal                            anti-inflammatory drugs. Procedure Code(s):        --- Professional  ---                           [id number], Esophagogastroduodenoscopy, flexible,                            transoral; with biopsy, single or multiple Diagnosis Code(s):        --- Professional ---                           R13.10, Dysphagia, unspecified                           K22.8, Other specified diseases of esophagus                           K25.9, Gastric ulcer, unspecified as acute or                            chronic, without hemorrhage or perforation                           R10.13, Epigastric pain                           R11.2, Nausea with vomiting, unspecified CPT copyright 2019 [employer] Medical Association. All rights reserved. The codes documented in this report are preliminary and upon coder review may  be revised to meet current compliance requirements. Maylon Peppers, MD Maylon Peppers,  04/12/2022 8:51:09 AM This report has been signed electronically. Number of Addenda: 0

## 2022-04-13 LAB — H. PYLORI ANTIBODY, IGG: H Pylori IgG: 0.6 Index Value (ref 0.00–0.79)

## 2022-04-14 LAB — SURGICAL PATHOLOGY

## 2022-04-14 NOTE — Anesthesia Postprocedure Evaluation (Signed)
Anesthesia Post Note  Patient: Regina Watson  Procedure(s) Performed: COLONOSCOPY WITH PROPOFOL ESOPHAGOGASTRODUODENOSCOPY (EGD) WITH PROPOFOL BIOPSY  Patient location during evaluation: Phase II Anesthesia Type: General Level of consciousness: awake Pain management: pain level controlled Vital Signs Assessment: post-procedure vital signs reviewed and stable Respiratory status: spontaneous breathing and respiratory function stable Cardiovascular status: blood pressure returned to baseline and stable Postop Assessment: no headache and no apparent nausea or vomiting Anesthetic complications: no Comments: Late entry   No notable events documented.   Last Vitals:  Vitals:   04/12/22 0915 04/12/22 0918  BP:  (!) 149/78  Pulse: 96 96  Resp: 10 13  Temp:  36.4 C  SpO2: 100% 100%    Last Pain:  Vitals:   04/13/22 1041  PainSc: 0-No pain                 Louann Sjogren

## 2022-04-18 ENCOUNTER — Other Ambulatory Visit (INDEPENDENT_AMBULATORY_CARE_PROVIDER_SITE_OTHER): Payer: Self-pay | Admitting: Gastroenterology

## 2022-04-18 ENCOUNTER — Encounter (HOSPITAL_COMMUNITY): Payer: Self-pay | Admitting: Gastroenterology

## 2022-04-21 ENCOUNTER — Telehealth: Payer: Self-pay | Admitting: Radiation Oncology

## 2022-04-21 ENCOUNTER — Inpatient Hospital Stay
Admission: RE | Admit: 2022-04-21 | Discharge: 2022-04-21 | Disposition: A | Discharge status: Home / Self Care | Payer: Self-pay | Source: Ambulatory Visit | Attending: Radiation Oncology | Admitting: Radiation Oncology

## 2022-04-21 ENCOUNTER — Other Ambulatory Visit: Payer: Self-pay | Admitting: Radiation Oncology

## 2022-04-21 ENCOUNTER — Ambulatory Visit
Admission: RE | Admit: 2022-04-21 | Discharge: 2022-04-21 | Disposition: A | Discharge status: Home / Self Care | Payer: Self-pay | Source: Ambulatory Visit | Attending: Radiation Oncology | Admitting: Radiation Oncology

## 2022-04-21 DIAGNOSIS — C50912 Malignant neoplasm of unspecified site of left female breast: Secondary | ICD-10-CM

## 2022-04-21 NOTE — Telephone Encounter (Signed)
8/31 @ 4:05 pm Left voicemail on [phone] for patient's rep to call our to schedule patient for consult with Dr. Sondra Come.

## 2022-04-22 ENCOUNTER — Telehealth: Payer: Self-pay | Admitting: Radiation Oncology

## 2022-04-22 NOTE — Telephone Encounter (Signed)
9/1 @ 8:45 am spoke to rep from Assisted Living for patient. He stated he was told that patient  was suppose to have her treatments closer to her home.  He would like to confirm, before he get her schedule with Dr. Sondra Come.  Waiting on call back.

## 2022-05-07 ENCOUNTER — Encounter (HOSPITAL_COMMUNITY): Payer: Self-pay

## 2022-05-07 ENCOUNTER — Observation Stay (HOSPITAL_COMMUNITY)
Admission: EM | Admit: 2022-05-07 | Discharge: 2022-05-08 | Disposition: A | Discharge status: Nursing Home (Includes ICF) | Payer: Medicaid Other | Attending: Family Medicine | Admitting: Family Medicine

## 2022-05-07 ENCOUNTER — Other Ambulatory Visit: Payer: Self-pay

## 2022-05-07 DIAGNOSIS — F17210 Nicotine dependence, cigarettes, uncomplicated: Secondary | ICD-10-CM | POA: Insufficient documentation

## 2022-05-07 DIAGNOSIS — Z794 Long term (current) use of insulin: Secondary | ICD-10-CM | POA: Diagnosis not present

## 2022-05-07 DIAGNOSIS — Z7984 Long term (current) use of oral hypoglycemic drugs: Secondary | ICD-10-CM | POA: Insufficient documentation

## 2022-05-07 DIAGNOSIS — E119 Type 2 diabetes mellitus without complications: Secondary | ICD-10-CM

## 2022-05-07 DIAGNOSIS — E1140 Type 2 diabetes mellitus with diabetic neuropathy, unspecified: Secondary | ICD-10-CM | POA: Diagnosis not present

## 2022-05-07 DIAGNOSIS — I10 Essential (primary) hypertension: Secondary | ICD-10-CM | POA: Diagnosis present

## 2022-05-07 DIAGNOSIS — N611 Abscess of the breast and nipple: Secondary | ICD-10-CM | POA: Diagnosis not present

## 2022-05-07 DIAGNOSIS — Z79899 Other long term (current) drug therapy: Secondary | ICD-10-CM | POA: Insufficient documentation

## 2022-05-07 DIAGNOSIS — F209 Schizophrenia, unspecified: Secondary | ICD-10-CM | POA: Diagnosis present

## 2022-05-07 DIAGNOSIS — E785 Hyperlipidemia, unspecified: Secondary | ICD-10-CM | POA: Diagnosis present

## 2022-05-07 DIAGNOSIS — N644 Mastodynia: Secondary | ICD-10-CM | POA: Diagnosis present

## 2022-05-07 DIAGNOSIS — Z853 Personal history of malignant neoplasm of breast: Secondary | ICD-10-CM | POA: Diagnosis not present

## 2022-05-07 DIAGNOSIS — Z7982 Long term (current) use of aspirin: Secondary | ICD-10-CM | POA: Diagnosis not present

## 2022-05-07 DIAGNOSIS — Z91040 Latex allergy status: Secondary | ICD-10-CM | POA: Insufficient documentation

## 2022-05-07 DIAGNOSIS — L0291 Cutaneous abscess, unspecified: Secondary | ICD-10-CM | POA: Diagnosis present

## 2022-05-07 HISTORY — DX: Malignant neoplasm of unspecified site of unspecified female breast: C50.919

## 2022-05-07 MED ORDER — VANCOMYCIN HCL 1750 MG/350ML IV SOLN
1750.0000 mg | Freq: Once | INTRAVENOUS | Status: AC
  Administered 2022-05-08: 1750 mg via INTRAVENOUS
  Filled 2022-05-07: qty 350

## 2022-05-07 NOTE — ED Triage Notes (Signed)
Pt brought in by [facility] from assisted living/group home, [facility], with c/o left breast pain, drainage and swelling. 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 Wound is linear and located on surgical Incision  of left lateral breast.

## 2022-05-08 ENCOUNTER — Observation Stay (HOSPITAL_COMMUNITY): Payer: Medicaid Other | Admitting: Anesthesiology

## 2022-05-08 ENCOUNTER — Encounter (HOSPITAL_COMMUNITY)
Admission: EM | Disposition: A | Discharge status: Nursing Home (Includes ICF) | Payer: Self-pay | Source: Home / Self Care | Attending: Emergency Medicine

## 2022-05-08 DIAGNOSIS — N611 Abscess of the breast and nipple: Secondary | ICD-10-CM

## 2022-05-08 DIAGNOSIS — F203 Undifferentiated schizophrenia: Secondary | ICD-10-CM | POA: Diagnosis not present

## 2022-05-08 DIAGNOSIS — L0291 Cutaneous abscess, unspecified: Secondary | ICD-10-CM | POA: Diagnosis present

## 2022-05-08 DIAGNOSIS — E1140 Type 2 diabetes mellitus with diabetic neuropathy, unspecified: Secondary | ICD-10-CM | POA: Diagnosis not present

## 2022-05-08 DIAGNOSIS — E119 Type 2 diabetes mellitus without complications: Secondary | ICD-10-CM | POA: Diagnosis not present

## 2022-05-08 DIAGNOSIS — Z853 Personal history of malignant neoplasm of breast: Secondary | ICD-10-CM | POA: Diagnosis not present

## 2022-05-08 DIAGNOSIS — I10 Essential (primary) hypertension: Secondary | ICD-10-CM | POA: Diagnosis not present

## 2022-05-08 DIAGNOSIS — E782 Mixed hyperlipidemia: Secondary | ICD-10-CM

## 2022-05-08 HISTORY — PX: INCISION AND DRAINAGE ABSCESS: SHX5864

## 2022-05-08 LAB — PROTIME-INR
INR: 1.1 (ref 0.8–1.2)
Prothrombin Time: 13.8 seconds (ref 11.4–15.2)

## 2022-05-08 LAB — COMPREHENSIVE METABOLIC PANEL
ALT: 15 U/L (ref 0–44)
ALT: 12 U/L (ref 0–44)
AST: 14 U/L — ABNORMAL LOW (ref 15–41)
AST: 11 U/L — ABNORMAL LOW (ref 15–41)
Albumin: 3.8 g/dL (ref 3.5–5.0)
Albumin: 2.8 g/dL — ABNORMAL LOW (ref 3.5–5.0)
Alkaline Phosphatase: 101 U/L (ref 38–126)
Alkaline Phosphatase: 72 U/L (ref 38–126)
Anion gap: 12 (ref 5–15)
Anion gap: 7 (ref 5–15)
BUN: 23 mg/dL — ABNORMAL HIGH (ref 6–20)
BUN: 19 mg/dL (ref 6–20)
CO2: 18 mmol/L — ABNORMAL LOW (ref 22–32)
CO2: 21 mmol/L — ABNORMAL LOW (ref 22–32)
Calcium: 9.4 mg/dL (ref 8.9–10.3)
Calcium: 8.7 mg/dL — ABNORMAL LOW (ref 8.9–10.3)
Chloride: 108 mmol/L (ref 98–111)
Chloride: 111 mmol/L (ref 98–111)
Creatinine, Ser: 0.89 mg/dL (ref 0.44–1.00)
Creatinine, Ser: 0.57 mg/dL (ref 0.44–1.00)
GFR, Estimated: >60 mL/min (ref >60)
GFR, Estimated: >60 mL/min (ref >60)
Glucose, Bld: 187 mg/dL — ABNORMAL HIGH (ref 70–99)
Glucose, Bld: 79 mg/dL (ref 70–99)
Potassium: 3.5 mmol/L (ref 3.5–5.1)
Potassium: 3.4 mmol/L — ABNORMAL LOW (ref 3.5–5.1)
Sodium: 138 mmol/L (ref 135–145)
Sodium: 139 mmol/L (ref 135–145)
Total Bilirubin: 0.5 mg/dL (ref 0.3–1.2)
Total Bilirubin: 0.6 mg/dL (ref 0.3–1.2)
Total Protein: 8.7 g/dL — ABNORMAL HIGH (ref 6.5–8.1)
Total Protein: 6.7 g/dL (ref 6.5–8.1)

## 2022-05-08 LAB — CBC WITH DIFFERENTIAL/PLATELET
Abs Immature Granulocytes: 0.05 10*3/uL (ref 0.00–0.07)
Abs Immature Granulocytes: 0.06 10*3/uL (ref 0.00–0.07)
Basophils Absolute: 0 10*3/uL (ref 0.0–0.1)
Basophils Absolute: 0 10*3/uL (ref 0.0–0.1)
Basophils Relative: 1 %
Basophils Relative: 1 %
Eosinophils Absolute: 0.1 10*3/uL (ref 0.0–0.5)
Eosinophils Absolute: 0.2 10*3/uL (ref 0.0–0.5)
Eosinophils Relative: 2 %
Eosinophils Relative: 3 %
HCT: 34.6 % — ABNORMAL LOW (ref 36.0–46.0)
HCT: 35.6 % — ABNORMAL LOW (ref 36.0–46.0)
Hemoglobin: 11.3 g/dL — ABNORMAL LOW (ref 12.0–15.0)
Hemoglobin: 11.3 g/dL — ABNORMAL LOW (ref 12.0–15.0)
Immature Granulocytes: 1 %
Immature Granulocytes: 1 %
Lymphocytes Relative: 23 %
Lymphocytes Relative: 37 %
Lymphs Abs: 1.4 10*3/uL (ref 0.7–4.0)
Lymphs Abs: 2.3 10*3/uL (ref 0.7–4.0)
MCH: 30.1 pg (ref 26.0–34.0)
MCH: 30.6 pg (ref 26.0–34.0)
MCHC: 31.7 g/dL (ref 30.0–36.0)
MCHC: 32.7 g/dL (ref 30.0–36.0)
MCV: 93.8 fL (ref 80.0–100.0)
MCV: 94.9 fL (ref 80.0–100.0)
Monocytes Absolute: 1.1 10*3/uL — ABNORMAL HIGH (ref 0.1–1.0)
Monocytes Absolute: 1.1 10*3/uL — ABNORMAL HIGH (ref 0.1–1.0)
Monocytes Relative: 19 %
Monocytes Relative: 19 %
Neutro Abs: 2.4 10*3/uL (ref 1.7–7.7)
Neutro Abs: 3.3 10*3/uL (ref 1.7–7.7)
Neutrophils Relative %: 39 %
Neutrophils Relative %: 54 %
Platelets: 283 10*3/uL (ref 150–400)
Platelets: 285 10*3/uL (ref 150–400)
RBC: 3.69 MIL/uL — ABNORMAL LOW (ref 3.87–5.11)
RBC: 3.75 MIL/uL — ABNORMAL LOW (ref 3.87–5.11)
RDW: 13.1 % (ref 11.5–15.5)
RDW: 13.2 % (ref 11.5–15.5)
WBC: 6 10*3/uL (ref 4.0–10.5)
WBC: 6.1 10*3/uL (ref 4.0–10.5)
nRBC: 0 % (ref 0.0–0.2)
nRBC: 0 % (ref 0.0–0.2)

## 2022-05-08 LAB — APTT: aPTT: 29 seconds (ref 24–36)

## 2022-05-08 LAB — HIV ANTIBODY (ROUTINE TESTING W REFLEX): HIV Screen 4th Generation wRfx: NONREACTIVE

## 2022-05-08 LAB — LACTIC ACID, PLASMA
Lactic Acid, Venous: 2 mmol/L (ref 0.5–1.9)
Lactic Acid, Venous: 2.7 mmol/L (ref 0.5–1.9)
Lactic Acid, Venous: 1.9 mmol/L (ref 0.5–1.9)

## 2022-05-08 LAB — GLUCOSE, CAPILLARY
Glucose-Capillary: 62 mg/dL — ABNORMAL LOW (ref 70–99)
Glucose-Capillary: 114 mg/dL — ABNORMAL HIGH (ref 70–99)
Glucose-Capillary: 119 mg/dL — ABNORMAL HIGH (ref 70–99)

## 2022-05-08 LAB — HEMOGLOBIN A1C
Hgb A1c MFr Bld: 5.9 % — ABNORMAL HIGH (ref 4.8–5.6)
Mean Plasma Glucose: 122.63 mg/dL

## 2022-05-08 LAB — MAGNESIUM: Magnesium: 1.9 mg/dL (ref 1.7–2.4)

## 2022-05-08 SURGERY — INCISION AND DRAINAGE, ABSCESS
Anesthesia: General | Site: Breast | Laterality: Left

## 2022-05-08 MED ORDER — ONDANSETRON HCL 4 MG PO TABS: 4.0000 mg | ORAL_TABLET | Freq: Three times a day (TID) | ORAL | 0 refills | Status: DC | PRN

## 2022-05-08 MED ORDER — SUCCINYLCHOLINE CHLORIDE 200 MG/10ML IV SOSY
PREFILLED_SYRINGE | INTRAVENOUS | Status: AC
  Filled 2022-05-08: qty 10

## 2022-05-08 MED ORDER — DEXAMETHASONE SODIUM PHOSPHATE 10 MG/ML IJ SOLN
INTRAMUSCULAR | Status: AC
  Filled 2022-05-08: qty 1

## 2022-05-08 MED ORDER — ONDANSETRON HCL 4 MG PO TABS: 4.0000 mg | ORAL_TABLET | Freq: Four times a day (QID) | ORAL | Status: DC | PRN

## 2022-05-08 MED ORDER — DOXYCYCLINE HYCLATE 100 MG PO CAPS: 100.0000 mg | ORAL_CAPSULE | Freq: Two times a day (BID) | ORAL | 0 refills | Status: AC

## 2022-05-08 MED ORDER — UMECLIDINIUM BROMIDE 62.5 MCG/ACT IN AEPB
1.0000 | INHALATION_SPRAY | Freq: Every day | RESPIRATORY_TRACT | Status: DC
  Administered 2022-05-08: 1 via RESPIRATORY_TRACT
  Filled 2022-05-08: qty 7

## 2022-05-08 MED ORDER — FENTANYL CITRATE (PF) 100 MCG/2ML IJ SOLN
INTRAMUSCULAR | Status: DC | PRN
  Administered 2022-05-08 (×3): 50 ug via INTRAVENOUS

## 2022-05-08 MED ORDER — QUETIAPINE FUMARATE ER 200 MG PO TB24
800.0000 mg | ORAL_TABLET | Freq: Every day | ORAL | Status: DC
  Filled 2022-05-08: qty 2

## 2022-05-08 MED ORDER — DEXTROSE 50 % IV SOLN: 50.0000 mL | Freq: Once | INTRAVENOUS | Status: DC

## 2022-05-08 MED ORDER — PROPOFOL 10 MG/ML IV BOLUS
INTRAVENOUS | Status: AC
  Filled 2022-05-08: qty 20

## 2022-05-08 MED ORDER — MORPHINE SULFATE (PF) 2 MG/ML IV SOLN: 2.0000 mg | INTRAVENOUS | Status: DC | PRN

## 2022-05-08 MED ORDER — SODIUM CHLORIDE 0.9 % IV SOLN: INTRAVENOUS | Status: DC

## 2022-05-08 MED ORDER — HYDROXYZINE HCL 25 MG PO TABS
25.0000 mg | ORAL_TABLET | Freq: Three times a day (TID) | ORAL | Status: DC
  Administered 2022-05-08 (×2): 25 mg via ORAL
  Filled 2022-05-08 (×2): qty 1

## 2022-05-08 MED ORDER — RISPERIDONE 3 MG PO TBDP
3.0000 mg | ORAL_TABLET | Freq: Two times a day (BID) | ORAL | Status: DC
  Administered 2022-05-08: 3 mg via ORAL
  Filled 2022-05-08 (×4): qty 3

## 2022-05-08 MED ORDER — ROCURONIUM BROMIDE 10 MG/ML (PF) SYRINGE
PREFILLED_SYRINGE | INTRAVENOUS | Status: AC
  Filled 2022-05-08: qty 10

## 2022-05-08 MED ORDER — SODIUM CHLORIDE 0.9 % IR SOLN
Status: DC | PRN
  Administered 2022-05-08: 1000 mL

## 2022-05-08 MED ORDER — INSULIN DETEMIR 100 UNIT/ML ~~LOC~~ SOLN
18.0000 [IU] | Freq: Every day | SUBCUTANEOUS | Status: DC
  Filled 2022-05-08: qty 0.18

## 2022-05-08 MED ORDER — VANCOMYCIN HCL 750 MG/150ML IV SOLN
750.0000 mg | Freq: Two times a day (BID) | INTRAVENOUS | Status: DC
  Filled 2022-05-08: qty 150

## 2022-05-08 MED ORDER — MIDAZOLAM HCL 2 MG/2ML IJ SOLN
INTRAMUSCULAR | Status: AC
  Filled 2022-05-08: qty 2

## 2022-05-08 MED ORDER — OXYCODONE HCL 5 MG PO TABS: 5.0000 mg | ORAL_TABLET | ORAL | Status: DC | PRN

## 2022-05-08 MED ORDER — BENZTROPINE MESYLATE 1 MG PO TABS
0.5000 mg | ORAL_TABLET | Freq: Two times a day (BID) | ORAL | Status: DC
  Administered 2022-05-08: 0.5 mg via ORAL
  Filled 2022-05-08: qty 1

## 2022-05-08 MED ORDER — ONDANSETRON HCL 4 MG/2ML IJ SOLN
INTRAMUSCULAR | Status: DC | PRN
  Administered 2022-05-08: 4 mg via INTRAVENOUS

## 2022-05-08 MED ORDER — PANTOPRAZOLE SODIUM 40 MG PO TBEC
40.0000 mg | DELAYED_RELEASE_TABLET | Freq: Every day | ORAL | Status: DC
  Administered 2022-05-08: 40 mg via ORAL
  Filled 2022-05-08: qty 1

## 2022-05-08 MED ORDER — ACETAMINOPHEN 650 MG RE SUPP: 650.0000 mg | Freq: Four times a day (QID) | RECTAL | Status: DC | PRN

## 2022-05-08 MED ORDER — ACETAZOLAMIDE ER 500 MG PO CP12
500.0000 mg | ORAL_CAPSULE | Freq: Two times a day (BID) | ORAL | Status: DC
  Filled 2022-05-08 (×4): qty 1

## 2022-05-08 MED ORDER — METOCLOPRAMIDE HCL 5 MG/ML IJ SOLN
INTRAMUSCULAR | Status: DC | PRN
  Administered 2022-05-08: 10 mg via INTRAVENOUS

## 2022-05-08 MED ORDER — METOCLOPRAMIDE HCL 5 MG/ML IJ SOLN
INTRAMUSCULAR | Status: AC
  Filled 2022-05-08: qty 2

## 2022-05-08 MED ORDER — MEPERIDINE HCL 50 MG/ML IJ SOLN: 6.2500 mg | INTRAMUSCULAR | Status: DC | PRN

## 2022-05-08 MED ORDER — ATORVASTATIN CALCIUM 10 MG PO TABS: 10.0000 mg | ORAL_TABLET | Freq: Every evening | ORAL | Status: DC

## 2022-05-08 MED ORDER — ALBUTEROL SULFATE HFA 108 (90 BASE) MCG/ACT IN AERS: 2.0000 | INHALATION_SPRAY | RESPIRATORY_TRACT | Status: DC | PRN

## 2022-05-08 MED ORDER — HYDROMORPHONE HCL 1 MG/ML IJ SOLN: 0.2500 mg | INTRAMUSCULAR | Status: DC | PRN

## 2022-05-08 MED ORDER — GABAPENTIN 400 MG PO CAPS
400.0000 mg | ORAL_CAPSULE | Freq: Four times a day (QID) | ORAL | Status: DC
  Administered 2022-05-08 (×2): 400 mg via ORAL
  Filled 2022-05-08 (×2): qty 1

## 2022-05-08 MED ORDER — ASPIRIN 81 MG PO TBEC
81.0000 mg | DELAYED_RELEASE_TABLET | Freq: Every morning | ORAL | Status: DC
  Administered 2022-05-08: 81 mg via ORAL
  Filled 2022-05-08: qty 1

## 2022-05-08 MED ORDER — LACTATED RINGERS IV BOLUS
1000.0000 mL | Freq: Once | INTRAVENOUS | Status: AC
  Administered 2022-05-08: 1000 mL via INTRAVENOUS

## 2022-05-08 MED ORDER — ONDANSETRON HCL 4 MG/2ML IJ SOLN
INTRAMUSCULAR | Status: AC
  Filled 2022-05-08: qty 2

## 2022-05-08 MED ORDER — MIDAZOLAM HCL 5 MG/5ML IJ SOLN
INTRAMUSCULAR | Status: DC | PRN
  Administered 2022-05-08: 2 mg via INTRAVENOUS

## 2022-05-08 MED ORDER — PHENYLEPHRINE 80 MCG/ML (10ML) SYRINGE FOR IV PUSH (FOR BLOOD PRESSURE SUPPORT)
PREFILLED_SYRINGE | INTRAVENOUS | Status: AC
  Filled 2022-05-08: qty 20

## 2022-05-08 MED ORDER — EPHEDRINE SULFATE-NACL 50-0.9 MG/10ML-% IV SOSY
PREFILLED_SYRINGE | INTRAVENOUS | Status: DC | PRN
  Administered 2022-05-08: 5 mg via INTRAVENOUS

## 2022-05-08 MED ORDER — LIDOCAINE HCL (CARDIAC) PF 100 MG/5ML IV SOSY
PREFILLED_SYRINGE | INTRAVENOUS | Status: DC | PRN
  Administered 2022-05-08: 100 mg via INTRATRACHEAL

## 2022-05-08 MED ORDER — FENTANYL CITRATE (PF) 100 MCG/2ML IJ SOLN
INTRAMUSCULAR | Status: AC
  Filled 2022-05-08: qty 2

## 2022-05-08 MED ORDER — ALBUTEROL SULFATE (2.5 MG/3ML) 0.083% IN NEBU: 2.5000 mg | INHALATION_SOLUTION | RESPIRATORY_TRACT | Status: DC | PRN

## 2022-05-08 MED ORDER — TIOTROPIUM BROMIDE MONOHYDRATE 18 MCG IN CAPS: 18.0000 ug | ORAL_CAPSULE | Freq: Every day | RESPIRATORY_TRACT | Status: DC

## 2022-05-08 MED ORDER — ACETAMINOPHEN 325 MG PO TABS: 650.0000 mg | ORAL_TABLET | Freq: Four times a day (QID) | ORAL | Status: DC | PRN

## 2022-05-08 MED ORDER — LIDOCAINE HCL (PF) 2 % IJ SOLN
INTRAMUSCULAR | Status: AC
  Filled 2022-05-08: qty 5

## 2022-05-08 MED ORDER — PHENYLEPHRINE 80 MCG/ML (10ML) SYRINGE FOR IV PUSH (FOR BLOOD PRESSURE SUPPORT)
PREFILLED_SYRINGE | INTRAVENOUS | Status: DC | PRN
  Administered 2022-05-08 (×4): 160 ug via INTRAVENOUS
  Administered 2022-05-08: 80 ug via INTRAVENOUS
  Administered 2022-05-08 (×2): 160 ug via INTRAVENOUS
  Administered 2022-05-08: 80 ug via INTRAVENOUS

## 2022-05-08 MED ORDER — SUCCINYLCHOLINE CHLORIDE 200 MG/10ML IV SOSY
PREFILLED_SYRINGE | INTRAVENOUS | Status: DC | PRN
  Administered 2022-05-08: 120 mg via INTRAVENOUS

## 2022-05-08 MED ORDER — INSULIN GLARGINE 100 UNIT/ML ~~LOC~~ SOLN: 25.0000 [IU] | Freq: Every day | SUBCUTANEOUS | Status: DC

## 2022-05-08 MED ORDER — INSULIN ASPART 100 UNIT/ML IJ SOLN: 0.0000 [IU] | Freq: Three times a day (TID) | INTRAMUSCULAR | Status: DC

## 2022-05-08 MED ORDER — PROPOFOL 10 MG/ML IV BOLUS
INTRAVENOUS | Status: DC | PRN
  Administered 2022-05-08: 150 mg via INTRAVENOUS

## 2022-05-08 MED ORDER — DIVALPROEX SODIUM ER 500 MG PO TB24: 1000.0000 mg | ORAL_TABLET | Freq: Every day | ORAL | Status: DC

## 2022-05-08 MED ORDER — DEXTROSE 50 % IV SOLN
INTRAVENOUS | Status: AC
  Filled 2022-05-08: qty 50

## 2022-05-08 MED ORDER — LACTATED RINGERS IV SOLN: INTRAVENOUS | Status: DC | PRN

## 2022-05-08 MED ORDER — AMLODIPINE BESYLATE 5 MG PO TABS
5.0000 mg | ORAL_TABLET | Freq: Every day | ORAL | Status: DC
  Administered 2022-05-08: 5 mg via ORAL
  Filled 2022-05-08: qty 1

## 2022-05-08 MED ORDER — INSULIN ASPART 100 UNIT/ML IJ SOLN: 0.0000 [IU] | Freq: Every day | INTRAMUSCULAR | Status: DC

## 2022-05-08 MED ORDER — ONDANSETRON HCL 4 MG/2ML IJ SOLN: 4.0000 mg | Freq: Four times a day (QID) | INTRAMUSCULAR | Status: DC | PRN

## 2022-05-08 SURGICAL SUPPLY — 22 items
BNDG CONFORM 2 STRL LF (GAUZE/BANDAGES/DRESSINGS) ×1 IMPLANT
BNDG GAUZE ELAST 4 BULKY (GAUZE/BANDAGES/DRESSINGS) IMPLANT
CLOTH BEACON ORANGE TIMEOUT ST (SAFETY) ×1 IMPLANT
COVER LIGHT HANDLE STERIS (MISCELLANEOUS) ×2 IMPLANT
ELECT REM PT RETURN 9FT ADLT (ELECTROSURGICAL) ×1
ELECTRODE REM PT RTRN 9FT ADLT (ELECTROSURGICAL) ×1 IMPLANT
GAUZE SPONGE 4X4 12PLY STRL (GAUZE/BANDAGES/DRESSINGS) ×2 IMPLANT
GLOVE BIOGEL PI IND STRL 6.5 (GLOVE) ×1 IMPLANT
GLOVE BIOGEL PI IND STRL 7.0 (GLOVE) ×2 IMPLANT
GLOVE SURG SS PI 6.5 STRL IVOR (GLOVE) IMPLANT
GLOVE SURG SS PI 7.0 STRL IVOR (GLOVE) IMPLANT
GOWN STRL REUS W/TWL LRG LVL3 (GOWN DISPOSABLE) ×2 IMPLANT
KIT TURNOVER KIT A (KITS) ×1 IMPLANT
MANIFOLD NEPTUNE II (INSTRUMENTS) ×1 IMPLANT
NS IRRIG 1000ML POUR BTL (IV SOLUTION) ×1 IMPLANT
PACK MINOR (CUSTOM PROCEDURE TRAY) ×1 IMPLANT
PAD ABD 5X9 TENDERSORB (GAUZE/BANDAGES/DRESSINGS) IMPLANT
PAD ARMBOARD 7.5X6 YLW CONV (MISCELLANEOUS) ×1 IMPLANT
SET BASIN LINEN APH (SET/KITS/TRAYS/PACK) ×1 IMPLANT
SOL PREP PROV IODINE SCRUB 4OZ (MISCELLANEOUS) ×1 IMPLANT
SWAB CULTURE LIQ STUART DBL (MISCELLANEOUS) IMPLANT
SYR BULB IRRIG 60ML STRL (SYRINGE) ×1 IMPLANT

## 2022-05-08 NOTE — Anesthesia Procedure Notes (Signed)
Procedure Name: Intubation Date/Time: 05/08/2022 11:50 AM  Performed by: Denese Killings, MDPre-anesthesia Checklist: Patient identified, Emergency Drugs available, Suction available and Patient being monitored Patient Re-evaluated:Patient Re-evaluated prior to induction Oxygen Delivery Method: Circle system utilized Preoxygenation: Pre-oxygenation with 100% oxygen Induction Type: IV induction Laryngoscope Size: Mac and 3 Grade View: Grade II Tube type: Oral Tube size: 7.0 mm Number of attempts: 1 Airway Equipment and Method: Stylet Placement Confirmation: ETT inserted through vocal cords under direct vision, positive ETCO2 and breath sounds checked- equal and bilateral Secured at: 21 cm Tube secured with: Tape Dental Injury: Teeth and Oropharynx as per pre-operative assessment

## 2022-05-08 NOTE — Discharge Instructions (Addendum)
Wound Care Instructions:  -Pack the left breast incision site with saline dampened kerlix daily -May remove packing in the shower -Let soapy water run over the incision -Pat dry after getting out of shower, and then repack wound -Keep area covered -Schedule a follow up appointment for Dr. Charlane Ferretti as soon as possible   IMPORTANT INFORMATION: PAY CLOSE ATTENTION   PHYSICIAN DISCHARGE INSTRUCTIONS  Follow with Primary care provider  Clovia Cuff, MD  and other consultants as instructed by your Hospitalist Physician  [place], WORSEN OR NEW PROBLEM DEVELOPS   Please note: You were cared for by a hospitalist during your hospital stay. Every effort will be made to forward records to your primary care provider.  You can request that your primary care provider send for your hospital records if they have not received them.  Once you are discharged, your primary care physician will handle any further medical issues. Please note that NO REFILLS for any discharge medications will be authorized once you are discharged, as it is imperative that you return to your primary care physician (or establish a relationship with a primary care physician if you do not have one) for your post hospital discharge needs so that they can reassess your need for medications and monitor your lab values.  Please get a complete blood count and chemistry panel checked by your Primary MD at your next visit, and again as instructed by your Primary MD.  Get Medicines reviewed and adjusted: Please take all your medications with you for your next visit with your Primary MD  Laboratory/radiological data: Please request your Primary MD to go over all hospital tests and procedure/radiological results at the follow up, please ask your primary care provider to get all Hospital records sent to his/her office.  In some cases, they will be blood work, cultures and biopsy  results pending at the time of your discharge. Please request that your primary care provider follow up on these results.  If you are diabetic, please bring your blood sugar readings with you to your follow up appointment with primary care.    Please call and make your follow up appointments as soon as possible.    Also Note the following: If you experience worsening of your admission symptoms, develop shortness of breath, life threatening emergency, suicidal or homicidal thoughts you must seek medical attention immediately by calling 911 or calling your MD immediately  if symptoms less severe.  You must read complete instructions/literature along with all the possible adverse reactions/side effects for all the Medicines you take and that have been prescribed to you. Take any new Medicines after you have completely understood and accpet all the possible adverse reactions/side effects.   Do not drive when taking Pain medications or sleeping medications (Benzodiazepines)  Do not take more than prescribed Pain, Sleep and Anxiety Medications. It is not advisable to combine anxiety,sleep and pain medications without talking with your primary care practitioner  Special Instructions: If you have smoked or chewed Tobacco  in the last 2 yrs please stop smoking, stop any regular Alcohol  and or any Recreational drug use.  Wear Seat belts while driving.  Do not drive if taking any narcotic, mind altering or controlled substances or recreational drugs or alcohol.

## 2022-05-08 NOTE — Anesthesia Preprocedure Evaluation (Signed)
Anesthesia Evaluation  Patient identified by MRN, date of birth, ID band Patient awake    Reviewed: Allergy & Precautions, NPO status , Patient's Chart, lab work & pertinent test results  Airway Mallampati: III  TM Distance: >3 FB Neck ROM: Full    Dental  (+) Dental Advisory Given, Missing, Chipped, Poor Dentition, Loose,  Multiple slightly loose teeth, and broken tooth:   Pulmonary Current Smoker and Patient abstained from smoking.,    Pulmonary exam normal breath sounds clear to auscultation       Cardiovascular hypertension, Pt. on medications Normal cardiovascular exam Rhythm:Regular Rate:Normal     Neuro/Psych PSYCHIATRIC DISORDERS Depression Schizophrenia  Neuromuscular disease    GI/Hepatic Neg liver ROS, GERD  Medicated and Poorly Controlled,  Endo/Other  diabetes, Well Controlled, Type 2, Oral Hypoglycemic Agents, Insulin Dependent  Renal/GU Renal InsufficiencyRenal disease  negative genitourinary   Musculoskeletal negative musculoskeletal ROS (+)   Abdominal   Peds negative pediatric ROS (+)  Hematology  (+) Blood dyscrasia, anemia ,   Anesthesia Other Findings Left hip  And lower extremity pain AKI (acute kidney injury) (HCC) Abscess Acute encephalopathy Acute metabolic encephalopathy Adjustment disorder with mixed disturbance of emotions and conduct Altered mental status Constipation Diabetic neuropathy (HCC) Dysphagia Early satiety Encephalopathy acute Essential hypertension Hyperglycemia due to diabetes mellitus (HCC) Hyperlipidemia Leukocytosis Loss of weight Nausea and vomiting Palpitations Schizophrenia (HCC) Tachycardia Tobacco use Type 2 diabetes mellitus without complication (HCC) UTI (urinary tract infection) Uterine mass    Reproductive/Obstetrics negative OB ROS                             Anesthesia Physical Anesthesia Plan  ASA: 3 and  emergent  Anesthesia Plan: General   Post-op Pain Management: Dilaudid IV   Induction: Intravenous, Rapid sequence and Cricoid pressure planned  PONV Risk Score and Plan: 3 and Ondansetron and Metaclopromide  Airway Management Planned: Oral ETT  Additional Equipment:   Intra-op Plan:   Post-operative Plan:   Informed Consent: I have reviewed the patients History and Physical, chart, labs and discussed the procedure including the risks, benefits and alternatives for the proposed anesthesia with the patient or authorized representative who has indicated his/her understanding and acceptance.     Dental advisory given  Plan Discussed with: CRNA and Surgeon  Anesthesia Plan Comments:         Anesthesia Quick Evaluation

## 2022-05-08 NOTE — Assessment & Plan Note (Signed)
-  basal insulin 25 units at home -Continue reduced dose basal insulin -Sliding scale coverage -NPO except sips and chips for possible procedure -check hgb A1C -monitor CBGs

## 2022-05-08 NOTE — Assessment & Plan Note (Signed)
-  continue norvasc

## 2022-05-08 NOTE — Assessment & Plan Note (Signed)
continue statin

## 2022-05-08 NOTE — Assessment & Plan Note (Signed)
-  at incision site from lumpectomy -consult gen surg -continue vancomycin -continue to monitor

## 2022-05-08 NOTE — ED Provider Notes (Signed)
[facility] EMERGENCY DEPARTMENT Provider Note   CSN: [id number] Arrival date & time: 05/07/22  2309     History  Chief Complaint  Patient presents with   Breast Problem    ?infection/abcess    Regina Watson is a 56 y.o. female.  HPI   Patient with history of schizophrenia presents with left breast wound and drainage.  Patient has a history of breast cancer and had lumpectomy and biopsy August 8. Over the past 2 weeks has had increasing swelling, and over the past days had warmth and drainage.  No fevers or vomiting.  Patient denies any other complaints History is limited due to history of schizophrenia Past Medical History:  Diagnosis Date   Anemia    Breast cancer ([place])    Diabetes mellitus without complication ([place])    Hypertension    MDD (major depressive disorder)    Neuropathy 2010   Schizophrenia ([place])     Home Medications Prior to Admission medications   Medication Sig Start Date End Date Taking? Authorizing Provider  acetaminophen (TYLENOL) 325 MG tablet Take 650 mg by mouth every 6 (six) hours as needed for mild pain or moderate pain.    [provider]  acetaZOLAMIDE ER (DIAMOX) 500 MG capsule Take 500 mg by mouth 2 (two) times daily.    [provider]  albuterol (VENTOLIN HFA) 108 (90 Base) MCG/ACT inhaler Inhale 2 puffs into the lungs every 4 (four) hours as needed for wheezing or shortness of breath.    [provider]  amLODipine (NORVASC) 5 MG tablet Take 1 tablet (5 mg total) by mouth daily. 01/21/21   Orson Eva, MD  aspirin EC 81 MG tablet Take 81 mg by mouth every morning.    [provider]  atorvastatin (LIPITOR) 10 MG tablet Take 10 mg by mouth every evening.     [provider]  benztropine (COGENTIN) 0.5 MG tablet Take 0.5 mg by mouth 2 (two) times daily.    [provider]  colchicine 0.6 MG tablet Take 0.6 mg by mouth daily. 08/03/21   [provider]  divalproex (DEPAKOTE ER) 500  MG 24 hr tablet Take 1,000 mg by mouth at bedtime.    [provider]  FARXIGA 10 MG TABS tablet Take 10 mg by mouth daily. 06/30/21   [provider]  furosemide (LASIX) 40 MG tablet Take 1 tablet (40 mg total) by mouth daily as needed for fluid or edema. 10/17/20   Lavina Hamman, MD  gabapentin (NEURONTIN) 400 MG capsule Take 1 capsule (400 mg total) by mouth 3 (three) times daily. Patient taking differently: Take 400 mg by mouth 4 (four) times daily. 10/17/20   Lavina Hamman, MD  hydrOXYzine (ATARAX) 25 MG tablet Take 25 mg by mouth 3 (three) times daily.    [provider]  insulin aspart (NOVOLOG FLEXPEN) 100 UNIT/ML FlexPen Inject 11-15 Units into the skin 3 (three) times daily with meals. 150-200=11units,201-250=12units, 251-300=13units, 301-350=14units, 351-400=15units    [provider]  insulin glargine (LANTUS) 100 UNIT/ML injection Inject 0.14 mLs (14 Units total) into the skin at bedtime. Patient taking differently: Inject 25 Units into the skin 2 (two) times daily. 10/17/19   Manuella Ghazi, Pratik D, DO  levocetirizine (XYZAL) 5 MG tablet Take 5 mg by mouth daily.    [provider]  metFORMIN (GLUCOPHAGE) 1000 MG tablet Take 1,000 mg by mouth 2 (two) times daily with a meal.    [provider]  Methylcellulose,  Laxative, (GOODSENSE FIBER PO) Take 2 Scoops by mouth 3 (three) times daily. 2 tspn into 4-8 ounces of beverage    [provider]  omeprazole (PRILOSEC) 40 MG capsule Take 1 capsule (40 mg total) by mouth daily. 04/12/22   Harvel Quale, MD  polyethylene glycol (MIRALAX / GLYCOLAX) 17 g packet Take 17 g by mouth daily.    [provider]  QUEtiapine (SEROQUEL XR) 400 MG 24 hr tablet Take 800 mg by mouth at bedtime.    [provider]  risperiDONE (RISPERDAL M-TABS) 3 MG disintegrating tablet Take 3 mg by mouth 2 (two) times daily.    [provider]  Semaglutide,0.25 or 0.'5MG'$ /DOS,  (OZEMPIC, 0.25 OR 0.5 MG/DOSE,) 2 MG/1.5ML SOPN 0.25 mg by Subconjunctival route every Friday.    [provider]  sennosides-docusate sodium (SENOKOT-S) 8.6-50 MG tablet Take 1 tablet by mouth in the morning and at bedtime.    [provider]  sucralfate (CARAFATE) 1 g tablet Take 1 tablet (1 g total) by mouth 2 (two) times daily for 28 days. 04/12/22 05/10/22  Harvel Quale, MD  tiotropium (SPIRIVA) 18 MCG inhalation capsule Place 18 mcg into inhaler and inhale daily.    [provider]  traMADol (ULTRAM) 50 MG tablet Take 50 mg by mouth every 6 (six) hours as needed for severe pain.    [provider]  Vitamin D, Ergocalciferol, (DRISDOL) 1.25 MG (50000 UNIT) CAPS capsule Take 50,000 Units by mouth every Saturday.    [provider]  Wheat Dextrin (GOODSENSE BEST FIBER) POWD STIR (2) TEASPOONFUL INTO 4-8 OUNCES OF BEVERAGE OR SOFT FOOD THREE TIMES DAILY WITH MEALS. **STIR WELL UNTIL DISSOLVED.** 04/19/22   Gabriel Rung, NP      Allergies    Ziprasidone hcl, Penicillinase, Sulfa antibiotics, Bactrim [sulfamethoxazole-trimethoprim], Penicillins, and Latex    Review of Systems   Review of Systems  Constitutional:  Negative for fever.  Skin:  Positive for wound.    Physical Exam Updated Vital Signs BP 138/77   Pulse 87   Temp 98.1 F (36.7 C) (Oral)   Resp 14   Ht 1.753 m ('5\' 9"'$ )   Wt 91 kg   SpO2 99%   BMI 29.63 kg/m  Physical Exam CONSTITUTIONAL: Well appearing, no acute distress HEAD: Normocephalic/atraumatic EYES: EOMI ENMT: Mucous membranes moist NECK: supple no meningeal signs CV: S1/S2 noted, no murmurs/rubs/gallops noted LUNGS: Lungs are clear to auscultation bilaterally Breast exam chaperoned by nurse Baldo Ash The right breast has no obvious lesions. Left breast has area of significant swelling and pus drainage to the left outer quadrant near the axilla, see photo No crepitus ABDOMEN: soft NEURO: Pt is  awake/alert/appropriate, moves all extremitiesx4.  No facial droop.   EXTREMITIES:   full ROM SKIN: warm, color normal PSYCH: Flat affect     ED Results / Procedures / Treatments   Labs (all labs ordered are listed, but only abnormal results are displayed) Labs Reviewed  LACTIC ACID, PLASMA - Abnormal; Notable for the following components:      Result Value   Lactic Acid, Venous 2.0 (*)    All other components within normal limits  COMPREHENSIVE METABOLIC PANEL - Abnormal; Notable for the following components:   CO2 18 (*)    Glucose, Bld 187 (*)    BUN 23 (*)    Total Protein 8.7 (*)    AST 14 (*)    All other components within normal limits  CBC WITH DIFFERENTIAL/PLATELET -  Abnormal; Notable for the following components:   RBC 3.69 (*)    Hemoglobin 11.3 (*)    HCT 34.6 (*)    Monocytes Absolute 1.1 (*)    All other components within normal limits  CULTURE, BLOOD (ROUTINE X 2)  CULTURE, BLOOD (ROUTINE X 2)  PROTIME-INR  APTT  LACTIC ACID, PLASMA    EKG EKG Interpretation  Date/Time:  Sunday May 08 2022 00:19:10 EDT Ventricular Rate:  90 PR Interval:  171 QRS Duration: 82 QT Interval:  364 QTC Calculation: 446 R Axis:   47 Text Interpretation: Sinus rhythm Confirmed by Ripley Fraise [phone] on 05/08/2022 12:22:13 AM  Radiology No results found.  Procedures Procedures    Medications Ordered in ED Medications  vancomycin (VANCOREADY) IVPB 1750 mg/350 mL (1,750 mg Intravenous New Bag/Given 05/08/22 0039)  vancomycin (VANCOREADY) IVPB 750 mg/150 mL (has no administration in time range)  lactated ringers bolus 1,000 mL (1,000 mLs Intravenous New Bag/Given 05/08/22 0113)    ED Course/ Medical Decision Making/ A&P Clinical Course as of 05/08/22 0154  Sun May 08, 2022  0021 Per care everywhere, patient was seen at the end of August and had a seroma that was drained.  She now appears to have an infection/abscess in that area [DW]  0125 Glucose(!):  187 Hyperglycemia & dehydration [DW]  0153 D/w dr zierle for admission [DW]    Clinical Course User Index [DW] Ripley Fraise, MD                           Medical Decision Making Amount and/or Complexity of Data Reviewed Labs: ordered. Decision-making details documented in ED Course.  Risk Prescription drug management. Decision regarding hospitalization.   This patient presents to the ED for concern of breast swelling, this involves an extensive number of treatment options, and is a complaint that carries with it a high risk of complications and morbidity.  The differential diagnosis includes but is not limited to mastitis, abscess, postoperative infection  Comorbidities that complicate the patient evaluation: Patient's presentation is complicated by their history of schizophrenia  Social Determinants of Health: Patient's  resides in assisted living   increases the complexity of managing their presentation  Additional history obtained: Records reviewed Care Everywhere/External Records  Lab Tests: I Ordered, and personally interpreted labs.  The pertinent results include: Hyperglycemia   Medicines ordered and prescription drug management: I ordered medication including vancomycin for postoperative infection Reevaluation of the patient after these medicines showed that the patient    stayed the same   Critical Interventions:  IV antibiotics, IV fluids  Consultations Obtained: I requested consultation with the admitting physician [facility] , and discussed  findings as well as pertinent plan - they recommend: Need for IV antibiotics  Reevaluation: After the interventions noted above, I reevaluated the patient and found that they have :stayed the same  Complexity of problems addressed: Patient's presentation is most consistent with  acute presentation with potential threat to life or bodily function  Disposition: After consideration of the diagnostic results and the  patient's response to treatment,  I feel that the patent would benefit from admission   .   1:55 AM Due to lack of access to care and worsening signs of infection, patient will be admitted for IV antibiotics and general surgery consultation later in the morning.  Patient is overall hemodynamically appropriate.         Final Clinical Impression(s) / ED Diagnoses Final diagnoses:  Breast abscess    Rx / DC Orders ED Discharge Orders     None         Ripley Fraise, MD 05/08/22 [phone]

## 2022-05-08 NOTE — Progress Notes (Signed)
Pharmacy Antibiotic Note  Regina Watson is a 56 y.o. female admitted on 05/07/2022 with  wound infection .  Pharmacy has been consulted for Vancomycin dosing.  Vancomycin 750 mg IV Q 12 hrs. Goal AUC 400-550. Expected AUC: 491 SCr used: 0.89  Plan: Vanc 1750 mg IV x 1, then 750 mg IV q12hr Monitor renal function, clinical status and vanc levels as needed  Height: '5\' 9"'$  (175.3 cm) Weight: 91 kg (200 lb 9.9 oz) IBW/kg (Calculated) : 66.2  Temp (24hrs), Avg:98.1 F (36.7 C), Min:98.1 F (36.7 C), Max:98.1 F (36.7 C)  Recent Labs  Lab 05/08/22 0030  WBC 6.0  CREATININE 0.89  LATICACIDVEN 2.0*    Estimated Creatinine Clearance: 86.8 mL/min (by C-G formula based on SCr of 0.89 mg/dL).    Allergies  Allergen Reactions   Ziprasidone Hcl     Other reaction(s): Other Psychotic reaction Psychotic reaction   Penicillinase Rash and Swelling   Sulfa Antibiotics Other (See Comments)   Bactrim [Sulfamethoxazole-Trimethoprim] Hives   Penicillins     Has patient had a PCN reaction causing immediate rash, facial/tongue/throat swelling, SOB or lightheadedness with hypotension: UNKNOWN Has patient had a PCN reaction causing severe rash involving mucus membranes or skin necrosis: UNKNOWN Has patient had a PCN reaction that required hospitalization: UNKNOWN Has patient had a PCN reaction occurring within the last 10 years: UNKNOWN If all of the above answers are "NO", then may proceed with Cephalosporin use.    Latex Itching    Antimicrobials this admission: Vanc 9/17 >>   Thank you for allowing pharmacy to be a part of this patient's care.  Alanda Slim, PharmD, [facility] Clinical Pharmacist Please see AMION for all Pharmacists' Contact Phone Numbers 05/08/2022, 1:34 AM

## 2022-05-08 NOTE — Op Note (Signed)
[facility] Operative Note  05/08/22  Preoperative Diagnosis: Left breast abscess   Postoperative Diagnosis: Same   Procedure(s) Performed: Incision and drainage of left breast abscess   Surgeon: Graciella Freer, DO    Assistants: No qualified resident was available    Anesthesia: General endotracheal   Anesthesiologist: Denese Killings, MD    Specimens: Left breast abscess culture   Estimated Blood Loss: Minimal   Blood Replacement: None    Complications: None   Wound Class: Dirty   Operative Indications: Patient is a 56 year old female who was admitted with a left breast abscess.  She is s/p left breast lumpectomy with sentinel lymph node biopsy on 8/8 with Dr. Charlane Ferretti.  She developed a post-operative seroma which required drainage at the end of August.  She presented to the ED with worsening pain and purulent drainage from her incision site.  She is agreeable to surgery at this time.  All risks and benefits of performing this procedure were discussed with the patient including pain, infection, bleeding, damage to the surrounding structures, and need for more procedures or surgery. The patient voiced understanding of the procedure, all questions were sought and answered, and consent was obtained.  Findings: Left breast abscess with >150cc of purulent drainage   Procedure: The patient was taken to the operating room and placed supine. General endotracheal anesthesia was induced. Patient is on IV vancomycin on the floor.  The left chest, breast, and axilla were prepared and draped in the usual sterile fashion. Time out was performed.  Incision was made through previous incision site at location of ulceration.  There was immediate evacuation of the large volume of pus.  Wound culture was obtained.  Greater than 150 cc of pus were evacuated.  The cavity was palpated, and no additional areas of loculation were noted.  The cavity was thoroughly irrigated  with saline until the fluid returned clear.  Hemostasis was achieved at the incision site with electrocautery.  The cavity was then packed with Betadine soaked Kerlix.  An additional shorter segment of another Kerlix roll was also used to pack the wound.  2 tails were left out.  A dressing was applied with an ABD pad and Medipore tape.  Final inspection revealed acceptable hemostasis. All counts were correct at the end of the case. The patient was awakened from anesthesia and extubated without complication.  The patient went to the PACU in stable condition.   Graciella Freer, DO  [facility] [address] [place], [place] [address] [phone] (office)

## 2022-05-08 NOTE — ED Notes (Signed)
Attempted PIV acces x 2 without success. Blood being obtained now by lab tech, Merry Proud, RN at bedside to attempt US guided PIV.

## 2022-05-08 NOTE — Assessment & Plan Note (Signed)
-  continue Risperdal, seroquel, depakote, cogentin and atarax

## 2022-05-08 NOTE — Consult Note (Signed)
[facility] Consult  Reason for Consult: left breast abscess Referring Physician: Dr. Clearence Ped  Chief Complaint   Breast Problem     HPI: Regina Watson is a 56 y.o. female who was admitted with concern for a left breast abscess.  She underwent a left breast lumpectomy with sentinel lymph node biopsy on 8/8 for invasive ductal carcinoma of the left breast by Dr. Charlane Ferretti.  Since that time, she states that it has gotten inflamed and required seroma drainage in the office.  She last saw her surgeon at the end of August.  She states that since that time, the area has continued to enlarge and become tender, and she is concerned for an abscess at this time.  She confirms subjective fevers at home and chills.  She also states that she has noted some purulent output from her incision site.  She otherwise has no complaints.  She has been tolerating a diet without nausea and vomiting.  Her past medical history significant for breast cancer, diabetes, hypertension, schizophrenia, and depression.  Her surgical history is significant for this left breast lumpectomy on 8/8 with sentinel lymph node biopsy and toe amputation.  She takes an 81 mg aspirin daily.  Overnight, she was admitted by the ED.  The ED felt this abscess was too large for drainage at bedside, so they admitted the patient with plan for general surgery evaluation.  She had no imaging the emergency department.  She was without a leukocytosis.  Her lactic acid was noted to be elevated at 2 and then 2.7, but has subsequently normalized to 1.9 this morning.  Past Medical History:  Diagnosis Date   Anemia    Breast cancer ([place])    Diabetes mellitus without complication ([place])    Hypertension    MDD (major depressive disorder)    Neuropathy 2010   Schizophrenia ([place])     Past Surgical History:  Procedure Laterality Date   BIOPSY  04/12/2022   Procedure: BIOPSY;  Surgeon: Harvel Quale, MD;  Location: [facility]  ENDO SUITE;  Service: Gastroenterology;;   COLONOSCOPY WITH PROPOFOL N/A 04/12/2022   Procedure: COLONOSCOPY WITH PROPOFOL;  Surgeon: Harvel Quale, MD;  Location: [facility] END[facility];  Service: Gastroenterology;  Laterality: N/A;  1230 ASA 3 pt at [facility]   ESOPHAGOGASTRODUODENOSCOPY (EGD) WITH PROPOFOL N/A 04/12/2022   Procedure: ESOPHAGOGASTRODUODENOSCOPY (EGD) WITH PROPOFOL;  Surgeon: Harvel Quale, MD;  Location: [facility] END[facility];  Service: Gastroenterology;  Laterality: N/A;   TOE AMPUTATION      Family History  Problem Relation Age of Onset   Hypertension Mother    Cancer Mother    Diabetes Father     Social History   Tobacco Use   Smoking status: Every Day    Packs/day: 1.00    Years: 20.00    Total pack years: 20.00    Types: Cigarettes   Smokeless tobacco: Never  Vaping Use   Vaping Use: Never used  Substance Use Topics   Alcohol use: No   Drug use: No    Medications: I have reviewed the patient's current medications.  Allergies  Allergen Reactions   Ziprasidone Hcl     Other reaction(s): Other Psychotic reaction Psychotic reaction   Penicillinase Rash and Swelling   Sulfa Antibiotics Other (See Comments)   Bactrim [Sulfamethoxazole-Trimethoprim] Hives   Penicillins     Has patient had a PCN reaction causing immediate rash, facial/tongue/throat swelling, SOB or lightheadedness with hypotension: UNKNOWN Has patient had  a PCN reaction causing severe rash involving mucus membranes or skin necrosis: UNKNOWN Has patient had a PCN reaction that required hospitalization: UNKNOWN Has patient had a PCN reaction occurring within the last 10 years: UNKNOWN If all of the above answers are "NO", then may proceed with Cephalosporin use.    Latex Itching     ROS:  Pertinent items noted in HPI and remainder of comprehensive ROS otherwise negative.  Blood pressure 136/76, pulse 87, temperature 97.8 F (36.6 C), temperature source  Oral, resp. rate 16, height '5\' 9"'$  (1.753 m), weight 92.9 kg, SpO2 100 %. Physical Exam Vitals reviewed.  Constitutional:      Appearance: Normal appearance.  HENT:     Head: Normocephalic and atraumatic.  Eyes:     Extraocular Movements: Extraocular movements intact.     Pupils: Pupils are equal, round, and reactive to light.  Cardiovascular:     Rate and Rhythm: Normal rate.  Pulmonary:     Effort: Pulmonary effort is normal.  Chest:     Comments: Left breast with palpable fluid collection with associated erythema and tenderness, central area of fluctuance noted, incision site with some ulceration and purulence Abdominal:     General: There is no distension.     Palpations: Abdomen is soft.     Tenderness: There is no abdominal tenderness.  Musculoskeletal:        General: Normal range of motion.     Cervical back: Normal range of motion.  Neurological:     General: No focal deficit present.     Mental Status: She is alert and oriented to person, place, and time.  Psychiatric:        Mood and Affect: Mood normal.        Behavior: Behavior normal.     Results: Results for orders placed or performed during the hospital encounter of 05/07/22 (from the past 48 hour(s))  Lactic acid, plasma     Status: Abnormal   Collection Time: 05/08/22 12:30 AM  Result Value Ref Range   Lactic Acid, Venous 2.0 (HH) 0.5 - 1.9 mmol/L    Comment: CRITICAL RESULT CALLED TO, READ BACK BY AND VERIFIED WITH: TURNER,C @ 0105 ON 05/08/22 BY JUW Performed at [facility], [address] [place], [place] [address]   Comprehensive metabolic panel     Status: Abnormal   Collection Time: 05/08/22 12:30 AM  Result Value Ref Range   Sodium 138 135 - 145 mmol/L   Potassium 3.5 3.5 - 5.1 mmol/L   Chloride 108 98 - 111 mmol/L   CO2 18 (L) 22 - 32 mmol/L   Glucose, Bld 187 (H) 70 - 99 mg/dL    Comment: Glucose reference range applies only to samples taken after fasting for at least 8 hours.   BUN 23 (H)  6 - 20 mg/dL   Creatinine, Ser 0.89 0.44 - 1.00 mg/dL   Calcium 9.4 8.9 - 10.3 mg/dL   Total Protein 8.7 (H) 6.5 - 8.1 g/dL   Albumin 3.8 3.5 - 5.0 g/dL   AST 14 (L) 15 - 41 U/L   ALT 15 0 - 44 U/L   Alkaline Phosphatase 101 38 - 126 U/L   Total Bilirubin 0.5 0.3 - 1.2 mg/dL   GFR, Estimated >60 >60 mL/min    Comment: (NOTE) Calculated using the CKD-EPI Creatinine Equation (2021)    Anion gap 12 5 - 15    Comment: Performed at [facility], [address] [place],  [address]  CBC with Differential     Status: Abnormal   Collection Time: 05/08/22 12:30 AM  Result Value Ref Range   WBC 6.0 4.0 - 10.5 K/uL   RBC 3.69 (L) 3.87 - 5.11 MIL/uL   Hemoglobin 11.3 (L) 12.0 - 15.0 g/dL   HCT 34.6 (L) 36.0 - 46.0 %   MCV 93.8 80.0 - 100.0 fL   MCH 30.6 26.0 - 34.0 pg   MCHC 32.7 30.0 - 36.0 g/dL   RDW 13.1 11.5 - 15.5 %   Platelets 285 150 - 400 K/uL   nRBC 0.0 0.0 - 0.2 %   Neutrophils Relative % 54 %   Neutro Abs 3.3 1.7 - 7.7 K/uL   Lymphocytes Relative 23 %   Lymphs Abs 1.4 0.7 - 4.0 K/uL   Monocytes Relative 19 %   Monocytes Absolute 1.1 (H) 0.1 - 1.0 K/uL   Eosinophils Relative 2 %   Eosinophils Absolute 0.1 0.0 - 0.5 K/uL   Basophils Relative 1 %   Basophils Absolute 0.0 0.0 - 0.1 K/uL   Immature Granulocytes 1 %   Abs Immature Granulocytes 0.05 0.00 - 0.07 K/uL    Comment: Performed at [facility], [address] [place], [place] [address]  Protime-INR     Status: None   Collection Time: 05/08/22 12:30 AM  Result Value Ref Range   Prothrombin Time 13.8 11.4 - 15.2 seconds   INR 1.1 0.8 - 1.2    Comment: (NOTE) INR goal varies based on device and disease states. Performed at [facility], [address] [place], [place] [address]   APTT     Status: None   Collection Time: 05/08/22 12:30 AM  Result Value Ref Range   aPTT 29 24 - 36 seconds    Comment: Performed at [facility], [address], [place], [place] [address]  Blood Culture (routine x 2)     Status:  None (Preliminary result)   Collection Time: 05/08/22 12:30 AM   Specimen: Left Antecubital; Blood  Result Value Ref Range   Specimen Description LEFT ANTECUBITAL BOTTLES DRAWN AEROBIC ONLY    Special Requests      Blood Culture results may not be optimal due to an inadequate volume of blood received in culture bottles Performed at [facility], [address] [place], [place] [address]    Culture PENDING    Report Status PENDING   Blood Culture (routine x 2)     Status: None (Preliminary result)   Collection Time: 05/08/22 12:42 AM   Specimen: BLOOD LEFT FOREARM  Result Value Ref Range   Specimen Description BLOOD LEFT FOREARM BOTTLES DRAWN AEROBIC ONLY    Special Requests      Blood Culture results may not be optimal due to an inadequate volume of blood received in culture bottles Performed at [facility], [address], [place], [place] [address]    Culture PENDING    Report Status PENDING   Lactic acid, plasma     Status: Abnormal   Collection Time: 05/08/22  2:52 AM  Result Value Ref Range   Lactic Acid, Venous 2.7 (HH) 0.5 - 1.9 mmol/L    Comment: CRITICAL RESULT CALLED TO, READ BACK BY AND VERIFIED WITH: KILMER,T @ 0321 ON 05/08/22 BY JUW Performed at [facility], [address] [place], [place] [address]   Comprehensive metabolic panel     Status: Abnormal   Collection Time: 05/08/22  5:41 AM  Result Value Ref Range   Sodium 139 135 -  145 mmol/L   Potassium 3.4 (L) 3.5 - 5.1 mmol/L   Chloride 111 98 - 111 mmol/L   CO2 21 (L) 22 - 32 mmol/L   Glucose, Bld 79 70 - 99 mg/dL    Comment: Glucose reference range applies only to samples taken after fasting for at least 8 hours.   BUN 19 6 - 20 mg/dL   Creatinine, Ser 0.57 0.44 - 1.00 mg/dL   Calcium 8.7 (L) 8.9 - 10.3 mg/dL   Total Protein 6.7 6.5 - 8.1 g/dL   Albumin 2.8 (L) 3.5 - 5.0 g/dL   AST 11 (L) 15 - 41 U/L   ALT 12 0 - 44 U/L   Alkaline Phosphatase 72 38 - 126 U/L   Total Bilirubin 0.6 0.3 - 1.2 mg/dL   GFR,  Estimated >60 >60 mL/min    Comment: (NOTE) Calculated using the CKD-EPI Creatinine Equation (2021)    Anion gap 7 5 - 15    Comment: Performed at [facility], [address] [place], [place] [address]  Magnesium     Status: None   Collection Time: 05/08/22  5:41 AM  Result Value Ref Range   Magnesium 1.9 1.7 - 2.4 mg/dL    Comment: Performed at [facility], [address] [place], [place] [address]  CBC with Differential/Platelet     Status: Abnormal   Collection Time: 05/08/22  5:41 AM  Result Value Ref Range   WBC 6.1 4.0 - 10.5 K/uL   RBC 3.75 (L) 3.87 - 5.11 MIL/uL   Hemoglobin 11.3 (L) 12.0 - 15.0 g/dL   HCT 35.6 (L) 36.0 - 46.0 %   MCV 94.9 80.0 - 100.0 fL   MCH 30.1 26.0 - 34.0 pg   MCHC 31.7 30.0 - 36.0 g/dL   RDW 13.2 11.5 - 15.5 %   Platelets 283 150 - 400 K/uL   nRBC 0.0 0.0 - 0.2 %   Neutrophils Relative % 39 %   Neutro Abs 2.4 1.7 - 7.7 K/uL   Lymphocytes Relative 37 %   Lymphs Abs 2.3 0.7 - 4.0 K/uL   Monocytes Relative 19 %   Monocytes Absolute 1.1 (H) 0.1 - 1.0 K/uL   Eosinophils Relative 3 %   Eosinophils Absolute 0.2 0.0 - 0.5 K/uL   Basophils Relative 1 %   Basophils Absolute 0.0 0.0 - 0.1 K/uL   Immature Granulocytes 1 %   Abs Immature Granulocytes 0.06 0.00 - 0.07 K/uL    Comment: Performed at [facility], [address] [place], [place] [address]  Lactic acid, plasma     Status: None   Collection Time: 05/08/22  5:42 AM  Result Value Ref Range   Lactic Acid, Venous 1.9 0.5 - 1.9 mmol/L    Comment: Performed at [facility], [address] [place], [place] [address]  Glucose, capillary     Status: Abnormal   Collection Time: 05/08/22  8:07 AM  Result Value Ref Range   Glucose-Capillary 62 (L) 70 - 99 mg/dL    Comment: Glucose reference range applies only to samples taken after fasting for at least 8 hours.    No results found.   Assessment & Plan:  Regina Watson is a 56 y.o. female who presents with concern for an infected left breast  seroma status post left breast lumpectomy with sentinel lymph node biopsy on 8/8.  -Given significant tenderness, size of the area, and purulent drainage, decision was made to take the patient to the operating room to  open and washout this area -The risk and benefits of left breast incision and drainage were discussed including but not limited to bleeding, infection, injury to surrounding structures, and need for additional procedures.  After careful consideration, Regina Watson has decided to to proceed with the procedure. -Patient on IV vancomycin on the floor -NPO -IV fluids -Pain medications and antiemetics as needed -Further recommendations regarding wound care to follow -Patient will need to follow-up with her surgeon, Dr. Charlane Ferretti upon discharge -Care per primary team  All questions were answered to the satisfaction of the patient.   -- Regina Freer, DO [facility] [address] [place], [place] [address] [phone] (office)

## 2022-05-08 NOTE — Anesthesia Postprocedure Evaluation (Signed)
Anesthesia Post Note  Patient: Regina Watson  Procedure(s) Performed: INCISION AND DRAINAGE ABSCESS of left breast abscess (Left: Breast)  Patient location during evaluation: Nursing Unit Anesthesia Type: General Level of consciousness: awake and alert, responds to stimulation, oriented and patient cooperative Pain management: pain level controlled Vital Signs Assessment: post-procedure vital signs reviewed and stable Respiratory status: spontaneous breathing, nonlabored ventilation, respiratory function stable and patient connected to nasal cannula oxygen Cardiovascular status: blood pressure returned to baseline and stable Postop Assessment: no apparent nausea or vomiting Anesthetic complications: no   No notable events documented.   Last Vitals:  Vitals:   05/08/22 1200 05/08/22 1215  BP: 92/64 114/69  Pulse: 82 83  Resp: 16 16  Temp:    SpO2: 99% 100%    Last Pain:  Vitals:   05/08/22 1215  TempSrc:   PainSc: 0-No pain                 Inesha Sow C Sherryn Pollino

## 2022-05-08 NOTE — H&P (Signed)
History and Physical    Patient: Regina Watson STM:[id number] DOB: Dec 20, 1965 DOA: 05/07/2022 DOS: the patient was seen and examined on 05/08/2022 PCP: Clovia Cuff, MD  Patient coming from:  Group home  Chief Complaint:  Chief Complaint  Patient presents with   Breast Problem    ?infection/abcess   HPI: Regina Watson is a 56 y.o. female with medical history significant of  Anemia, diabetes mellitus type 2, hypertension, neuropathy, breast cancer status post lumpectomy, and schizophrenia presents to the ED with a chief complaint of breast pain.  Apparently in the beginning of August patient had lumpectomy done.  At the end of August she followed up with the surgeon and had a seroma which was drained.  It seems she is developed another seroma which has now festered.  Patient has had upper outer left breast pain with drainage of purulent fluid.  It is located along the incision site.  Unfortunately, patient is not able to provide me with much history as she is hypersomnolent at the time of my exam.  She is on many sedating medications including Risperdal, Seroquel, Atarax, gabapentin.  She did not receive the medications in our ER but it is possible she received them at the group home prior to coming.  She cannot stay awake long enough to answer any questions.  When she does try to answer question.  Speech is slurred and her eyes are closed and he cannot understand what she is saying.  On exam, the closer he palpated to the left axilla the more tenderness.  ED felt that this abscess was too large to drain in the ED, so requesting admission for IV antibiotics and general surgery consult for I&D.  Review of Systems: unable to review all systems due to the inability of the patient to answer questions. Past Medical History:  Diagnosis Date   Anemia    Breast cancer ([place])    Diabetes mellitus without complication ([place])    Hypertension    MDD (major depressive disorder)    Neuropathy 2010    Schizophrenia ([place])    Past Surgical History:  Procedure Laterality Date   BIOPSY  04/12/2022   Procedure: BIOPSY;  Surgeon: Harvel Quale, MD;  [facility];  Service: Gastroenterology;;   COLONOSCOPY WITH PROPOFOL N/A 04/12/2022   Procedure: COLONOSCOPY WITH PROPOFOL;  Surgeon: Harvel Quale, MD;  [facility]: [facility];  Service: Gastroenterology;  Laterality: N/A;  1230 [facility]SA [facility]   ESOPHAGOGASTRODUODENOSCOPY (EGD) WITH PROPOFOL N/A 04/12/2022   Procedure: ESOPHAGOGASTRODUODENOSCOPY (EGD) WITH PROPOFOL;  Surgeon: Harvel Quale, MD;  Location[facility];  Service: Gastroenterology;  Laterality: N/A;   TOE AMPUTATION     Social History:  reports that she has been smoking cigarettes. She has a 20.00 pack-year smoking history. She has never used smokeless tobacco. She reports that she does not drink alcohol and does not use drugs.  Allergies  Allergen Reactions   Ziprasidone Hcl     Other reaction(s): Other Psychotic reaction Psychotic reaction   Penicillinase Rash and Swelling   Sulfa Antibiotics Other (See Comments)   Bactrim [Sulfamethoxazole-Trimethoprim] Hives   Penicillins     Has patient had a PCN reaction causing immediate rash, facial/tongue/throat swelling, SOB or lightheadedness with hypotension: UNKNOWN Has patient had a PCN reaction causing severe rash involving mucus membranes or skin necrosis: UNKNOWN Has patient had a PCN reaction that required hospitalization: UNKNOWN Has patient had a PCN reaction occurring within the  last 10 years: UNKNOWN If all of the above answers are "NO", then may proceed with Cephalosporin use.    Latex Itching    Family History  Problem Relation Age of Onset   Hypertension Mother    Cancer Mother    Diabetes Father     Prior to Admission medications   Medication Sig Start Date End Date Taking? Authorizing Provider  acetaminophen (TYLENOL) 325 MG tablet  Take 650 mg by mouth every 6 (six) hours as needed for mild pain or moderate pain.    [provider]  acetaZOLAMIDE ER (DIAMOX) 500 MG capsule Take 500 mg by mouth 2 (two) times daily.    [provider]  albuterol (VENTOLIN HFA) 108 (90 Base) MCG/ACT inhaler Inhale 2 puffs into the lungs every 4 (four) hours as needed for wheezing or shortness of breath.    [provider]  amLODipine (NORVASC) 5 MG tablet Take 1 tablet (5 mg total) by mouth daily. 01/21/21   Orson Eva, MD  aspirin EC 81 MG tablet Take 81 mg by mouth every morning.    [provider]  atorvastatin (LIPITOR) 10 MG tablet Take 10 mg by mouth every evening.     [provider]  benztropine (COGENTIN) 0.5 MG tablet Take 0.5 mg by mouth 2 (two) times daily.    [provider]  colchicine 0.6 MG tablet Take 0.6 mg by mouth daily. 08/03/21   [provider]  divalproex (DEPAKOTE ER) 500 MG 24 hr tablet Take 1,000 mg by mouth at bedtime.    [provider]  FARXIGA 10 MG TABS tablet Take 10 mg by mouth daily. 06/30/21   [provider]  furosemide (LASIX) 40 MG tablet Take 1 tablet (40 mg total) by mouth daily as needed for fluid or edema. 10/17/20   Lavina Hamman, MD  gabapentin (NEURONTIN) 400 MG capsule Take 1 capsule (400 mg total) by mouth 3 (three) times daily. Patient taking differently: Take 400 mg by mouth 4 (four) times daily. 10/17/20   Lavina Hamman, MD  hydrOXYzine (ATARAX) 25 MG tablet Take 25 mg by mouth 3 (three) times daily.    [provider]  insulin aspart (NOVOLOG FLEXPEN) 100 UNIT/ML FlexPen Inject 11-15 Units into the skin 3 (three) times daily with meals. 150-200=11units,201-250=12units, 251-300=13units, 301-350=14units, 351-400=15units    [provider]  insulin glargine (LANTUS) 100 UNIT/ML injection Inject 0.14 mLs (14 Units total) into the skin at bedtime. Patient taking differently: Inject 25 Units into the skin  2 (two) times daily. 10/17/19   Manuella Ghazi, Pratik D, DO  levocetirizine (XYZAL) 5 MG tablet Take 5 mg by mouth daily.    [provider]  metFORMIN (GLUCOPHAGE) 1000 MG tablet Take 1,000 mg by mouth 2 (two) times daily with a meal.    [provider]  Methylcellulose, Laxative, (GOODSENSE FIBER PO) Take 2 Scoops by mouth 3 (three) times daily. 2 tspn into 4-8 ounces of beverage    [provider]  omeprazole (PRILOSEC) 40 MG capsule Take 1 capsule (40 mg total) by mouth daily. 04/12/22   Harvel Quale, MD  polyethylene glycol (MIRALAX / GLYCOLAX) 17 g packet Take 17 g by mouth daily.    [provider]  QUEtiapine (SEROQUEL XR) 400 MG 24 hr tablet Take 800 mg by mouth at bedtime.    [provider]  risperiDONE (RISPERDAL M-TABS) 3 MG disintegrating tablet Take 3 mg by mouth 2 (two) times daily.  [provider]  Semaglutide,0.25 or 0.'5MG'$ /DOS, (OZEMPIC, 0.25 OR 0.5 MG/DOSE,) 2 MG/1.5ML SOPN 0.25 mg by Subconjunctival route every Friday.    [provider]  sennosides-docusate sodium (SENOKOT-S) 8.6-50 MG tablet Take 1 tablet by mouth in the morning and at bedtime.    [provider]  sucralfate (CARAFATE) 1 g tablet Take 1 tablet (1 g total) by mouth 2 (two) times daily for 28 days. 04/12/22 05/10/22  Harvel Quale, MD  tiotropium (SPIRIVA) 18 MCG inhalation capsule Place 18 mcg into inhaler and inhale daily.    [provider]  traMADol (ULTRAM) 50 MG tablet Take 50 mg by mouth every 6 (six) hours as needed for severe pain.    [provider]  Vitamin D, Ergocalciferol, (DRISDOL) 1.25 MG (50000 UNIT) CAPS capsule Take 50,000 Units by mouth every Saturday.    [provider]  Wheat Dextrin (GOODSENSE BEST FIBER) POWD STIR (2) TEASPOONFUL INTO 4-8 OUNCES OF BEVERAGE OR SOFT FOOD THREE TIMES DAILY WITH MEALS. **STIR WELL UNTIL DISSOLVED.** 04/19/22   Gabriel Rung, NP    Physical  Exam: Vitals:   05/08/22 0130 05/08/22 0200 05/08/22 0307 05/08/22 0331  BP: 138/77 130/70  136/76  Pulse: 87 88  87  Resp: '14 14  16  '$ Temp:    97.8 F (36.6 C)  TempSrc:    Oral  SpO2: 99% 99%  100%  Weight:   92.9 kg   Height:       1.  General: Patient lying supine in bed,  no acute distress   2. Psychiatric: Hypersomnolent and not answering questions  3. Neurologic: Speech slurred and language is normal, face is symmetric, moves all 4 extremities voluntarily, at baseline without acute deficits on limited exam   4. HEENMT:  Head is atraumatic, normocephalic, pupils reactive to light, neck is supple, trachea is midline, mucous membranes are moist   5. Respiratory : Lungs are clear to auscultation bilaterally without wheezing, rhonchi, rales, no cyanosis, no increase in work of breathing or accessory muscle use   6. Cardiovascular : Heart rate normal, rhythm is regular, no murmurs, rubs or gallops, no peripheral edema, peripheral pulses palpated   7. Gastrointestinal:  Abdomen is soft, nondistended, nontender to palpation bowel sounds active, no masses or organomegaly palpated   8. Skin:  Purulent drainage along the incision site near the left axilla in the upper outer breast tissue, with surrounding erythema   9.Musculoskeletal:  No acute deformities or trauma, no asymmetry in tone, no peripheral edema, peripheral pulses palpated, no tenderness to palpation in the extremities  Data Reviewed: In the ED Temp 98.1, heart rate 89-95, respiratory rate 14-18, blood pressure 131/72-144/80, satting at 100% No leukocytosis with white blood cell count of 60, hemoglobin 11.3, platelets 285 Chemistry reveals a decreased bicarb at 18, hyperglycemia Lactic acid initially 2.0 and then 2.4 with 1 L fluid bolus in the ED EKG shows a heart rate of 90, sinus rhythm, QTc 446 Blood culture pending Admission requested for IV antibiotics and GEN surge consult for the a.m.  Assessment and  Plan: * Abscess -at incision site from lumpectomy -consult gen surg -continue vancomycin -continue to monitor  Hyperlipidemia -continue statin  Type 2 diabetes mellitus without complication (HCC) -basal insulin 25 units at home -Continue reduced dose basal insulin -Sliding scale coverage -NPO except sips and chips for possible procedure -check hgb A1C -monitor CBGs  Schizophrenia (HCC) -continue Risperdal, seroquel, depakote, cogentin and atarax  Essential hypertension -continue norvasc  Advance Care Planning:   Code Status: Full Code   Consults: General surgery for I&D of left upper outer quadrant breast abscess  Family Communication: No family at bedside  Severity of Illness: The appropriate patient status for this patient is OBSERVATION. Observation status is judged to be reasonable and necessary in order to provide the required intensity of service to ensure the patient's safety. The patient's presenting symptoms, physical exam findings, and initial radiographic and laboratory data in the context of their medical condition is felt to place them at decreased risk for further clinical deterioration. Furthermore, it is anticipated that the patient will be medically stable for discharge from the hospital within 2 midnights of admission.   Author: Rolla Plate, DO 05/08/2022 5:13 AM  For on call review [email or web address].

## 2022-05-08 NOTE — Progress Notes (Signed)
TOC CSW contacted [facility] [employer] [phone].  Pt can return.  Transportation from [employer] will be picking pt up in one hour.  Deborahann Poteat Tarpley-Carter, MSW, LCSW-A Pronouns:  She/Her/Hers [facility] HealthTransitions of Care Clinical Social Worker Direct Number:  [phone] [email or web address]

## 2022-05-08 NOTE — Discharge Summary (Signed)
Physician Discharge Summary  Regina Watson WFU:[id number] DOB: [birth year] DOA: 05/07/2022  PCP: Clovia Cuff, MD Surgeon: Dr. Alvino Chapel  Admit date: 05/07/2022 Discharge date: 05/08/2022  Admitted From:  ALF   Disposition: ALF   Recommendations for Outpatient Follow-up:  Follow up with Dr. Charlane Ferretti in 2 days for recheck wound  Please follow up on the following pending results: deep wound culture results   Discharge Condition: STABLE   CODE STATUS: FULL DIET: regular    Brief Hospitalization Summary: Please see all hospital notes, images, labs for full details of the hospitalization. 56 y.o. female with medical history significant of  Anemia, diabetes mellitus type 2, hypertension, neuropathy, breast cancer status post lumpectomy, and schizophrenia presents to the ED with a chief complaint of breast pain.  Apparently in the beginning of August patient had lumpectomy done.  At the end of August she followed up with the surgeon and had a seroma which was drained.  It seems she is developed another seroma which has now festered.  Patient has had upper outer left breast pain with drainage of purulent fluid.  It is located along the incision site.  Unfortunately, patient is not able to provide me with much history as she is hypersomnolent at the time of my exam.  She is on many sedating medications including Risperdal, Seroquel, Atarax, gabapentin.  She did not receive the medications in our ER but it is possible she received them at the group home prior to coming.  She cannot stay awake long enough to answer any questions.  When she does try to answer question.  Speech is slurred and her eyes are closed and he cannot understand what she is saying.  On exam, the closer he palpated to the left axilla the more tenderness.  ED felt that this abscess was too large to drain in the ED, so requesting admission for IV antibiotics and general surgery consult for I&D.   Hospital Course by problem    Left Breast Abscess Pt was given IV vancomycin Pt was taken to OR with Dr. Okey Dupre on 9/17 for successful I&D Deep wound culture was sent Findings: Left breast abscess with >150cc of purulent drainage Pt tolerated procedure well.  Surgery said patient could discharge home later today if pain controlled and tolerating diet After monitoring on med-surg unit for several hours patient discharged back to ALF Surgery gave wound care instructions Rx for antibiotic x 7 days.  Bactrim recommended but patient allergic to Bactrim (hives) so discharged patient on 7 days of oral doxycycline, resume home tylenol and tramadol for pain management as needed.   See surgical wound care instructions  Pt to follow up with her primary surgeon Dr. Charlane Ferretti in 2 days   Type 2 diabetes mellitus - resume home treament and management   Schizophrenia  - resume all home behavioral health medications  Essential hypertension  - stable on home meds  Discharge Diagnoses:  Principal Problem:   Abscess Active Problems:   Essential hypertension   Schizophrenia ([place])   Type 2 diabetes mellitus without complication ([place])   Hyperlipidemia   Breast abscess   Discharge Instructions:  Allergies as of 05/08/2022       Reactions   Ziprasidone Hcl    Other reaction(s): Other Psychotic reaction Psychotic reaction   Penicillinase Rash, Swelling   Sulfa Antibiotics Other (See Comments)   Bactrim [sulfamethoxazole-trimethoprim] Hives   Penicillins    Has patient had a PCN reaction causing immediate rash, facial/tongue/throat swelling, SOB or lightheadedness  with hypotension: UNKNOWN Has patient had a PCN reaction causing severe rash involving mucus membranes or skin necrosis: UNKNOWN Has patient had a PCN reaction that required hospitalization: UNKNOWN Has patient had a PCN reaction occurring within the last 10 years: UNKNOWN If all of the above answers are "NO", then may proceed with Cephalosporin use.    Latex Itching        Medication List     TAKE these medications    acetaminophen 325 MG tablet Commonly known as: TYLENOL Take 650 mg by mouth every 6 (six) hours as needed for mild pain or moderate pain.   acetaZOLAMIDE ER 500 MG capsule Commonly known as: DIAMOX Take 500 mg by mouth 2 (two) times daily.   albuterol 108 (90 Base) MCG/ACT inhaler Commonly known as: VENTOLIN HFA Inhale 2 puffs into the lungs every 4 (four) hours as needed for wheezing or shortness of breath.   amLODipine 5 MG tablet Commonly known as: NORVASC Take 1 tablet (5 mg total) by mouth daily.   aspirin EC 81 MG tablet Take 81 mg by mouth every morning.   atorvastatin 10 MG tablet Commonly known as: LIPITOR Take 10 mg by mouth every evening.   benztropine 0.5 MG tablet Commonly known as: COGENTIN Take 0.5 mg by mouth 2 (two) times daily.   colchicine 0.6 MG tablet Take 0.6 mg by mouth daily.   divalproex 500 MG 24 hr tablet Commonly known as: DEPAKOTE ER Take 1,000 mg by mouth at bedtime.   doxycycline 100 MG capsule Commonly known as: VIBRAMYCIN Take 1 capsule (100 mg total) by mouth 2 (two) times daily for 7 days.   Farxiga 10 MG Tabs tablet Generic drug: dapagliflozin propanediol Take 10 mg by mouth daily.   furosemide 40 MG tablet Commonly known as: LASIX Take 1 tablet (40 mg total) by mouth daily as needed for fluid or edema.   gabapentin 400 MG capsule Commonly known as: NEURONTIN Take 1 capsule (400 mg total) by mouth 3 (three) times daily. What changed: when to take this   GoodSense Best Fiber Powd STIR (2) TEASPOONFUL INTO 4-8 OUNCES OF BEVERAGE OR SOFT FOOD THREE TIMES DAILY WITH MEALS. **STIR WELL UNTIL DISSOLVED.**   GOODSENSE FIBER PO Take 2 Scoops by mouth 3 (three) times daily. 2 tspn into 4-8 ounces of beverage   hydrOXYzine 25 MG tablet Commonly known as: ATARAX Take 25 mg by mouth 3 (three) times daily.   insulin glargine 100 UNIT/ML injection Commonly  known as: LANTUS Inject 0.25 mLs (25 Units total) into the skin daily. What changed:  how much to take when to take this   levocetirizine 5 MG tablet Commonly known as: XYZAL Take 5 mg by mouth daily.   metFORMIN 1000 MG tablet Commonly known as: GLUCOPHAGE Take 1,000 mg by mouth 2 (two) times daily with a meal.   NovoLOG FlexPen 100 UNIT/ML FlexPen Generic drug: insulin aspart Inject 11-15 Units into the skin 3 (three) times daily with meals. 150-200=11units,201-250=12units, 251-300=13units, 301-350=14units, 351-400=15units   omeprazole 40 MG capsule Commonly known as: PRILOSEC Take 1 capsule (40 mg total) by mouth daily.   ondansetron 4 MG tablet Commonly known as: Zofran Take 1 tablet (4 mg total) by mouth every 8 (eight) hours as needed for nausea or vomiting.   Ozempic (0.25 or 0.5 MG/DOSE) 2 MG/1.5ML Sopn Generic drug: Semaglutide(0.25 or 0.'5MG'$ /DOS) 0.25 mg by Subconjunctival route every Friday.   polyethylene glycol 17 g packet Commonly known as: MIRALAX / GLYCOLAX Take 17  g by mouth daily.   QUEtiapine 400 MG 24 hr tablet Commonly known as: SEROQUEL XR Take 800 mg by mouth at bedtime.   risperiDONE 3 MG disintegrating tablet Commonly known as: RISPERDAL M-TABS Take 3 mg by mouth 2 (two) times daily.   sennosides-docusate sodium 8.6-50 MG tablet Commonly known as: SENOKOT-S Take 1 tablet by mouth in the morning and at bedtime.   sucralfate 1 g tablet Commonly known as: CARAFATE Take 1 tablet (1 g total) by mouth 2 (two) times daily for 28 days.   tiotropium 18 MCG inhalation capsule Commonly known as: SPIRIVA Place 18 mcg into inhaler and inhale daily.   traMADol 50 MG tablet Commonly known as: ULTRAM Take 50 mg by mouth every 6 (six) hours as needed for severe pain.   Vitamin D (Ergocalciferol) 1.25 MG (50000 UNIT) Caps capsule Commonly known as: DRISDOL Take 50,000 Units by mouth every Saturday.        Follow-up Information     Alvino Chapel, MD. Schedule an appointment as soon as possible for a visit in 2 day(s).   Specialty: Surgery Why: Call to schedule a follow up appointment, For wound re-check Contact information: [place] [address] [phone]                Allergies  Allergen Reactions   Ziprasidone Hcl     Other reaction(s): Other Psychotic reaction Psychotic reaction   Penicillinase Rash and Swelling   Sulfa Antibiotics Other (See Comments)   Bactrim [Sulfamethoxazole-Trimethoprim] Hives   Penicillins     Has patient had a PCN reaction causing immediate rash, facial/tongue/throat swelling, SOB or lightheadedness with hypotension: UNKNOWN Has patient had a PCN reaction causing severe rash involving mucus membranes or skin necrosis: UNKNOWN Has patient had a PCN reaction that required hospitalization: UNKNOWN Has patient had a PCN reaction occurring within the last 10 years: UNKNOWN If all of the above answers are "NO", then may proceed with Cephalosporin use.    Latex Itching   Allergies as of 05/08/2022       Reactions   Ziprasidone Hcl    Other reaction(s): Other Psychotic reaction Psychotic reaction   Penicillinase Rash, Swelling   Sulfa Antibiotics Other (See Comments)   Bactrim [sulfamethoxazole-trimethoprim] Hives   Penicillins    Has patient had a PCN reaction causing immediate rash, facial/tongue/throat swelling, SOB or lightheadedness with hypotension: UNKNOWN Has patient had a PCN reaction causing severe rash involving mucus membranes or skin necrosis: UNKNOWN Has patient had a PCN reaction that required hospitalization: UNKNOWN Has patient had a PCN reaction occurring within the last 10 years: UNKNOWN If all of the above answers are "NO", then may proceed with Cephalosporin use.   Latex Itching        Medication List     TAKE these medications    acetaminophen 325 MG tablet Commonly known as: TYLENOL Take 650 mg by mouth  every 6 (six) hours as needed for mild pain or moderate pain.   acetaZOLAMIDE ER 500 MG capsule Commonly known as: DIAMOX Take 500 mg by mouth 2 (two) times daily.   albuterol 108 (90 Base) MCG/ACT inhaler Commonly known as: VENTOLIN HFA Inhale 2 puffs into the lungs every 4 (four) hours as needed for wheezing or shortness of breath.   amLODipine 5 MG tablet Commonly known as: NORVASC Take 1 tablet (5 mg total) by mouth daily.   aspirin EC 81 MG tablet Take 81 mg by mouth every  morning.   atorvastatin 10 MG tablet Commonly known as: LIPITOR Take 10 mg by mouth every evening.   benztropine 0.5 MG tablet Commonly known as: COGENTIN Take 0.5 mg by mouth 2 (two) times daily.   colchicine 0.6 MG tablet Take 0.6 mg by mouth daily.   divalproex 500 MG 24 hr tablet Commonly known as: DEPAKOTE ER Take 1,000 mg by mouth at bedtime.   doxycycline 100 MG capsule Commonly known as: VIBRAMYCIN Take 1 capsule (100 mg total) by mouth 2 (two) times daily for 7 days.   Farxiga 10 MG Tabs tablet Generic drug: dapagliflozin propanediol Take 10 mg by mouth daily.   furosemide 40 MG tablet Commonly known as: LASIX Take 1 tablet (40 mg total) by mouth daily as needed for fluid or edema.   gabapentin 400 MG capsule Commonly known as: NEURONTIN Take 1 capsule (400 mg total) by mouth 3 (three) times daily. What changed: when to take this   GoodSense Best Fiber Powd STIR (2) TEASPOONFUL INTO 4-8 OUNCES OF BEVERAGE OR SOFT FOOD THREE TIMES DAILY WITH MEALS. **STIR WELL UNTIL DISSOLVED.**   GOODSENSE FIBER PO Take 2 Scoops by mouth 3 (three) times daily. 2 tspn into 4-8 ounces of beverage   hydrOXYzine 25 MG tablet Commonly known as: ATARAX Take 25 mg by mouth 3 (three) times daily.   insulin glargine 100 UNIT/ML injection Commonly known as: LANTUS Inject 0.25 mLs (25 Units total) into the skin daily. What changed:  how much to take when to take this   levocetirizine 5 MG  tablet Commonly known as: XYZAL Take 5 mg by mouth daily.   metFORMIN 1000 MG tablet Commonly known as: GLUCOPHAGE Take 1,000 mg by mouth 2 (two) times daily with a meal.   NovoLOG FlexPen 100 UNIT/ML FlexPen Generic drug: insulin aspart Inject 11-15 Units into the skin 3 (three) times daily with meals. 150-200=11units,201-250=12units, 251-300=13units, 301-350=14units, 351-400=15units   omeprazole 40 MG capsule Commonly known as: PRILOSEC Take 1 capsule (40 mg total) by mouth daily.   ondansetron 4 MG tablet Commonly known as: Zofran Take 1 tablet (4 mg total) by mouth every 8 (eight) hours as needed for nausea or vomiting.   Ozempic (0.25 or 0.5 MG/DOSE) 2 MG/1.5ML Sopn Generic drug: Semaglutide(0.25 or 0.'5MG'$ /DOS) 0.25 mg by Subconjunctival route every Friday.   polyethylene glycol 17 g packet Commonly known as: MIRALAX / GLYCOLAX Take 17 g by mouth daily.   QUEtiapine 400 MG 24 hr tablet Commonly known as: SEROQUEL XR Take 800 mg by mouth at bedtime.   risperiDONE 3 MG disintegrating tablet Commonly known as: RISPERDAL M-TABS Take 3 mg by mouth 2 (two) times daily.   sennosides-docusate sodium 8.6-50 MG tablet Commonly known as: SENOKOT-S Take 1 tablet by mouth in the morning and at bedtime.   sucralfate 1 g tablet Commonly known as: CARAFATE Take 1 tablet (1 g total) by mouth 2 (two) times daily for 28 days.   tiotropium 18 MCG inhalation capsule Commonly known as: SPIRIVA Place 18 mcg into inhaler and inhale daily.   traMADol 50 MG tablet Commonly known as: ULTRAM Take 50 mg by mouth every 6 (six) hours as needed for severe pain.   Vitamin D (Ergocalciferol) 1.25 MG (50000 UNIT) Caps capsule Commonly known as: DRISDOL Take 50,000 Units by mouth every Saturday.        Procedures/Studies: No results found.   Subjective: Pt tolerated I&D well, pain controlled, tolerating diet. Pt agreeable to dressing changes and wound care instructions.  Discharge  Exam: Vitals:   05/08/22 1215 05/08/22 1514  BP: 114/69 (!) 140/79  Pulse: 83 87  Resp: 16 20  Temp:  (!) 97.3 F (36.3 C)  SpO2: 100% 97%   Vitals:   05/08/22 1142 05/08/22 1200 05/08/22 1215 05/08/22 1514  BP: (!) 84/72 92/64 114/69 (!) 140/79  Pulse: 85 82 83 87  Resp: '10 16 16 20  '$ Temp: 97.6 F (36.4 C)   (!) 97.3 F (36.3 C)  TempSrc:    Oral  SpO2: 99% 99% 100% 97%  Weight:      Height:       General: Pt is alert, awake, not in acute distress Cardiovascular: RRR, S1/S2 +, no rubs, no gallops Respiratory: CTA bilaterally, no wheezing, no rhonchi Abdominal: Soft, NT, ND, bowel sounds + Extremities: no edema, no cyanosis   The results of significant diagnostics from this hospitalization (including imaging, microbiology, ancillary and laboratory) are listed below for reference.     Microbiology: Recent Results (from the past 240 hour(s))  Blood Culture (routine x 2)     Status: None (Preliminary result)   Collection Time: 05/08/22 12:30 AM   Specimen: Left Antecubital; Blood  Result Value Ref Range Status   Specimen Description LEFT ANTECUBITAL BOTTLES DRAWN AEROBIC ONLY  Final   Special Requests   Final    Blood Culture results may not be optimal due to an inadequate volume of blood received in culture bottles Performed at [facility], [address], [place], [place] [address]    Culture PENDING  Incomplete   Report Status PENDING  Incomplete  Blood Culture (routine x 2)     Status: None (Preliminary result)   Collection Time: 05/08/22 12:42 AM   Specimen: BLOOD LEFT FOREARM  Result Value Ref Range Status   Specimen Description BLOOD LEFT FOREARM BOTTLES DRAWN AEROBIC ONLY  Final   Special Requests   Final    Blood Culture results may not be optimal due to an inadequate volume of blood received in culture bottles Performed at [facility], [address] [place], [place] [address]    Culture PENDING  Incomplete   Report Status PENDING  Incomplete      Labs: BNP (last 3 results) No results for input(s): "BNP" in the last 8760 hours. Basic Metabolic Panel: Recent Labs  Lab 05/08/22 0030 05/08/22 0541  NA 138 139  K 3.5 3.4*  CL 108 111  CO2 18* 21*  GLUCOSE 187* 79  BUN 23* 19  CREATININE 0.89 0.57  CALCIUM 9.4 8.7*  MG  --  1.9   Liver Function Tests: Recent Labs  Lab 05/08/22 0030 05/08/22 0541  AST 14* 11*  ALT 15 12  ALKPHOS 101 72  BILITOT 0.5 0.6  PROT 8.7* 6.7  ALBUMIN 3.8 2.8*   No results for input(s): "LIPASE", "AMYLASE" in the last 168 hours. No results for input(s): "AMMONIA" in the last 168 hours. CBC: Recent Labs  Lab 05/08/22 0030 05/08/22 0541  WBC 6.0 6.1  NEUTROABS 3.3 2.4  HGB 11.3* 11.3*  HCT 34.6* 35.6*  MCV 93.8 94.9  PLT 285 283   Cardiac Enzymes: No results for input(s): "CKTOTAL", "CKMB", "CKMBINDEX", "TROPONINI" in the last 168 hours. BNP: Invalid input(s): "POCBNP" CBG: Recent Labs  Lab 05/08/22 0807 05/08/22 1151  GLUCAP 62* 114*   D-Dimer No results for input(s): "DDIMER" in the last 72 hours. Hgb A1c Recent Labs    05/08/22 0030  HGBA1C 5.9*   Lipid Profile No results  for input(s): "CHOL", "HDL", "LDLCALC", "TRIG", "CHOLHDL", "LDLDIRECT" in the last 72 hours. Thyroid function studies No results for input(s): "TSH", "T4TOTAL", "T3FREE", "THYROIDAB" in the last 72 hours.  Invalid input(s): "FREET3" Anemia work up No results for input(s): "VITAMINB12", "FOLATE", "FERRITIN", "TIBC", "IRON", "RETICCTPCT" in the last 72 hours. Urinalysis    Component Value Date/Time   COLORURINE STRAW (A) 12/13/2021 1810   APPEARANCEUR CLOUDY (A) 12/13/2021 1810   LABSPEC 1.026 12/13/2021 1810   PHURINE 5.0 12/13/2021 1810   GLUCOSEU >=500 (A) 12/13/2021 1810   HGBUR SMALL (A) 12/13/2021 1810   BILIRUBINUR NEGATIVE 12/13/2021 1810   KETONESUR 5 (A) 12/13/2021 1810   PROTEINUR NEGATIVE 12/13/2021 1810   UROBILINOGEN 0.2 12/09/2013 2250   NITRITE NEGATIVE 12/13/2021 1810    LEUKOCYTESUR MODERATE (A) 12/13/2021 1810   Sepsis Labs Recent Labs  Lab 05/08/22 0030 05/08/22 0541  WBC 6.0 6.1   Microbiology Recent Results (from the past 240 hour(s))  Blood Culture (routine x 2)     Status: None (Preliminary result)   Collection Time: 05/08/22 12:30 AM   Specimen: Left Antecubital; Blood  Result Value Ref Range Status   Specimen Description LEFT ANTECUBITAL BOTTLES DRAWN AEROBIC ONLY  Final   Special Requests   Final    Blood Culture results may not be optimal due to an inadequate volume of blood received in culture bottles Performed at [facility], [address] [place], [place] [address]    Culture PENDING  Incomplete   Report Status PENDING  Incomplete  Blood Culture (routine x 2)     Status: None (Preliminary result)   Collection Time: 05/08/22 12:42 AM   Specimen: BLOOD LEFT FOREARM  Result Value Ref Range Status   Specimen Description BLOOD LEFT FOREARM BOTTLES DRAWN AEROBIC ONLY  Final   Special Requests   Final    Blood Culture results may not be optimal due to an inadequate volume of blood received in culture bottles Performed at [facility], [address], [place], [place] [address]    Culture PENDING  Incomplete   Report Status PENDING  Incomplete   Time coordinating discharge: 36 mins  SIGNED:  Irwin Brakeman, MD  [facility] 05/08/2022, 3:55 PM How to contact the [facility] Attending or Consulting provider [place] or covering provider during after hours [place], for this patient?  Check the care team in [facility] and look for a) attending/consulting [facility] provider listed and b) the [facility] team listed Log into [email or web address] and use [place]'s universal password to access. If you do not have the password, please contact the hospital operator. Locate the [facility] provider you are looking for under [facility] and page to a number that you can be directly reached. If you still have difficulty reaching the provider, please page the [facility]  (Director on Call) for the Hospitalists listed on amion for assistance.

## 2022-05-08 NOTE — Progress Notes (Signed)
[place] with the patient in PACU postoperatively.  I explained that we drained her large left breast abscess.  She currently has packing in her breast, which will need to be changed daily.  I attempted to call the patient's sister, however she did not answer, so I left a voicemail.  All questions were answered to the patient's expressed satisfaction.  Plan: -Regular diet -Continue vancomycin while inpatient -PRN pain control and antiemetics -Recommend discharge home with 1 week of Bactrim -Once patient tolerating a diet and pain adequately controlled, she is stable for discharge from a general surgery standpoint -She will need to change her packing daily with a saline dampened Kerlix.  Wound care orders placed.  Instructions included in her discharge paperwork -Patient will need to schedule follow-up with Dr. Charlane Ferretti, her original surgeon  Graciella Freer, [facility] [address] [place],  [address] [phone] (office)

## 2022-05-08 NOTE — Progress Notes (Signed)
ASSUMPTION OF CARE NOTE   05/08/2022 11:28 AM  Regina Watson was seen and examined.  The H&P by the admitting provider, orders, imaging was reviewed.  Please see new orders.  Will continue to follow.   Vitals:   05/08/22 0200 05/08/22 0331  BP: 130/70 136/76  Pulse: 88 87  Resp: 14 16  Temp:  97.8 F (36.6 C)  SpO2: 99% 100%    Results for orders placed or performed during the hospital encounter of 05/07/22  Blood Culture (routine x 2)   Specimen: Left Antecubital; Blood  Result Value Ref Range   Specimen Description LEFT ANTECUBITAL BOTTLES DRAWN AEROBIC ONLY    Special Requests      Blood Culture results may not be optimal due to an inadequate volume of blood received in culture bottles Performed at [facility], [address], [place], [place] [address]    Culture PENDING    Report Status PENDING   Blood Culture (routine x 2)   Specimen: BLOOD LEFT FOREARM  Result Value Ref Range   Specimen Description BLOOD LEFT FOREARM BOTTLES DRAWN AEROBIC ONLY    Special Requests      Blood Culture results may not be optimal due to an inadequate volume of blood received in culture bottles Performed at [facility], [address], [place], [place] [address]    Culture PENDING    Report Status PENDING   Lactic acid, plasma  Result Value Ref Range   Lactic Acid, Venous 2.0 (HH) 0.5 - 1.9 mmol/L  Lactic acid, plasma  Result Value Ref Range   Lactic Acid, Venous 2.7 (HH) 0.5 - 1.9 mmol/L  Comprehensive metabolic panel  Result Value Ref Range   Sodium 138 135 - 145 mmol/L   Potassium 3.5 3.5 - 5.1 mmol/L   Chloride 108 98 - 111 mmol/L   CO2 18 (L) 22 - 32 mmol/L   Glucose, Bld 187 (H) 70 - 99 mg/dL   BUN 23 (H) 6 - 20 mg/dL   Creatinine, Ser 0.89 0.44 - 1.00 mg/dL   Calcium 9.4 8.9 - 10.3 mg/dL   Total Protein 8.7 (H) 6.5 - 8.1 g/dL   Albumin 3.8 3.5 - 5.0 g/dL   AST 14 (L) 15 - 41 U/L   ALT 15 0 - 44 U/L   Alkaline Phosphatase 101 38 - 126 U/L   Total Bilirubin 0.5 0.3  - 1.2 mg/dL   GFR, Estimated >60 >60 mL/min   Anion gap 12 5 - 15  CBC with Differential  Result Value Ref Range   WBC 6.0 4.0 - 10.5 K/uL   RBC 3.69 (L) 3.87 - 5.11 MIL/uL   Hemoglobin 11.3 (L) 12.0 - 15.0 g/dL   HCT 34.6 (L) 36.0 - 46.0 %   MCV 93.8 80.0 - 100.0 fL   MCH 30.6 26.0 - 34.0 pg   MCHC 32.7 30.0 - 36.0 g/dL   RDW 13.1 11.5 - 15.5 %   Platelets 285 150 - 400 K/uL   nRBC 0.0 0.0 - 0.2 %   Neutrophils Relative % 54 %   Neutro Abs 3.3 1.7 - 7.7 K/uL   Lymphocytes Relative 23 %   Lymphs Abs 1.4 0.7 - 4.0 K/uL   Monocytes Relative 19 %   Monocytes Absolute 1.1 (H) 0.1 - 1.0 K/uL   Eosinophils Relative 2 %   Eosinophils Absolute 0.1 0.0 - 0.5 K/uL   Basophils Relative 1 %   Basophils Absolute 0.0 0.0 - 0.1 K/uL   Immature  Granulocytes 1 %   Abs Immature Granulocytes 0.05 0.00 - 0.07 K/uL  Protime-INR  Result Value Ref Range   Prothrombin Time 13.8 11.4 - 15.2 seconds   INR 1.1 0.8 - 1.2  APTT  Result Value Ref Range   aPTT 29 24 - 36 seconds  Comprehensive metabolic panel  Result Value Ref Range   Sodium 139 135 - 145 mmol/L   Potassium 3.4 (L) 3.5 - 5.1 mmol/L   Chloride 111 98 - 111 mmol/L   CO2 21 (L) 22 - 32 mmol/L   Glucose, Bld 79 70 - 99 mg/dL   BUN 19 6 - 20 mg/dL   Creatinine, Ser 0.57 0.44 - 1.00 mg/dL   Calcium 8.7 (L) 8.9 - 10.3 mg/dL   Total Protein 6.7 6.5 - 8.1 g/dL   Albumin 2.8 (L) 3.5 - 5.0 g/dL   AST 11 (L) 15 - 41 U/L   ALT 12 0 - 44 U/L   Alkaline Phosphatase 72 38 - 126 U/L   Total Bilirubin 0.6 0.3 - 1.2 mg/dL   GFR, Estimated >60 >60 mL/min   Anion gap 7 5 - 15  Magnesium  Result Value Ref Range   Magnesium 1.9 1.7 - 2.4 mg/dL  CBC with Differential/Platelet  Result Value Ref Range   WBC 6.1 4.0 - 10.5 K/uL   RBC 3.75 (L) 3.87 - 5.11 MIL/uL   Hemoglobin 11.3 (L) 12.0 - 15.0 g/dL   HCT 35.6 (L) 36.0 - 46.0 %   MCV 94.9 80.0 - 100.0 fL   MCH 30.1 26.0 - 34.0 pg   MCHC 31.7 30.0 - 36.0 g/dL   RDW 13.2 11.5 - 15.5 %    Platelets 283 150 - 400 K/uL   nRBC 0.0 0.0 - 0.2 %   Neutrophils Relative % 39 %   Neutro Abs 2.4 1.7 - 7.7 K/uL   Lymphocytes Relative 37 %   Lymphs Abs 2.3 0.7 - 4.0 K/uL   Monocytes Relative 19 %   Monocytes Absolute 1.1 (H) 0.1 - 1.0 K/uL   Eosinophils Relative 3 %   Eosinophils Absolute 0.2 0.0 - 0.5 K/uL   Basophils Relative 1 %   Basophils Absolute 0.0 0.0 - 0.1 K/uL   Immature Granulocytes 1 %   Abs Immature Granulocytes 0.06 0.00 - 0.07 K/uL  Lactic acid, plasma  Result Value Ref Range   Lactic Acid, Venous 1.9 0.5 - 1.9 mmol/L  Glucose, capillary  Result Value Ref Range   Glucose-Capillary 62 (L) 70 - 99 mg/dL     Murvin Natal, MD [facility]   05/07/2022 11:12 PM How to contact the [facility] Attending or Consulting provider [place] or covering provider during after hours 7P -7A, for this patient?  Check the care team in [facility] and look for a) attending/consulting [facility] provider listed and b) the [facility] team listed Log into [email or web address] and use [place]'s universal password to access. If you do not have the password, please contact the hospital operator. Locate the [facility] provider you are looking for under [facility] and page to a number that you can be directly reached. If you still have difficulty reaching the provider, please page the [facility] (Director on Call) for the Hospitalists listed on amion for assistance.

## 2022-05-08 NOTE — Hospital Course (Signed)
56 y.o. female with medical history significant of  Anemia, diabetes mellitus type 2, hypertension, neuropathy, breast cancer status post lumpectomy, and schizophrenia presents to the ED with a chief complaint of breast pain.  Apparently in the beginning of August patient had lumpectomy done.  At the end of August she followed up with the surgeon and had a seroma which was drained.  It seems she is developed another seroma which has now festered.  Patient has had upper outer left breast pain with drainage of purulent fluid.  It is located along the incision site.  Unfortunately, patient is not able to provide me with much history as she is hypersomnolent at the time of my exam.  She is on many sedating medications including Risperdal, Seroquel, Atarax, gabapentin.  She did not receive the medications in our ER but it is possible she received them at the group home prior to coming.  She cannot stay awake long enough to answer any questions.  When she does try to answer question.  Speech is slurred and her eyes are closed and he cannot understand what she is saying.  On exam, the closer he palpated to the left axilla the more tenderness.  ED felt that this abscess was too large to drain in the ED, so requesting admission for IV antibiotics and general surgery consult for I&D.

## 2022-05-08 NOTE — Transfer of Care (Addendum)
Immediate Anesthesia Transfer of Care Note  Patient: Regina Watson  Procedure(s) Performed: INCISION AND DRAINAGE ABSCESS of left breast abscess (Left: Breast)  Patient Location: PACU  Anesthesia Type:General  Level of Consciousness: sedated and drowsy  Airway & Oxygen Therapy: Patient Spontanous Breathing and Patient connected to face mask oxygen  Post-op Assessment: Report given to RN and Post -op Vital signs reviewed and stable  Post vital signs: Reviewed and stable  Last Vitals:  Vitals Value Taken Time  BP 95/62 05/08/22 1142  Temp 36.4 C 05/08/22 1142  Pulse 85 05/08/22 1142  Resp 10 05/08/22 1142  SpO2 99 % 05/08/22 1142    Last Pain:  Vitals:   05/08/22 1000  TempSrc:   PainSc: 4          Complications: No notable events documented.

## 2022-05-11 ENCOUNTER — Encounter (HOSPITAL_COMMUNITY): Payer: Self-pay | Admitting: Surgery

## 2022-05-13 LAB — AEROBIC/ANAEROBIC CULTURE W GRAM STAIN (SURGICAL/DEEP WOUND)

## 2022-05-14 LAB — CULTURE, BLOOD (ROUTINE X 2)
Culture: NO GROWTH
Culture: NO GROWTH

## 2022-05-17 ENCOUNTER — Encounter (HOSPITAL_COMMUNITY): Payer: Self-pay | Admitting: *Deleted

## 2022-05-17 ENCOUNTER — Other Ambulatory Visit: Payer: Self-pay

## 2022-05-17 DIAGNOSIS — Z79899 Other long term (current) drug therapy: Secondary | ICD-10-CM | POA: Diagnosis not present

## 2022-05-17 DIAGNOSIS — Z4800 Encounter for change or removal of nonsurgical wound dressing: Secondary | ICD-10-CM | POA: Diagnosis present

## 2022-05-17 DIAGNOSIS — Z91040 Latex allergy status: Secondary | ICD-10-CM | POA: Insufficient documentation

## 2022-05-17 DIAGNOSIS — Z7982 Long term (current) use of aspirin: Secondary | ICD-10-CM | POA: Insufficient documentation

## 2022-05-17 DIAGNOSIS — Z794 Long term (current) use of insulin: Secondary | ICD-10-CM | POA: Insufficient documentation

## 2022-05-17 NOTE — ED Triage Notes (Addendum)
Pt BIB for wound check, reported that a piece of gauze is still in the incision to left lateral breast/axillary.  Reported that there were three pieces of guaze and two were removed, unable to find the third piece. Pt with recent left breast lumpectomy on 03/29/22.  I&D on 9/17.   Cbg 150, 99.1 temp per EMS report.

## 2022-05-18 ENCOUNTER — Telehealth (INDEPENDENT_AMBULATORY_CARE_PROVIDER_SITE_OTHER): Payer: Self-pay

## 2022-05-18 ENCOUNTER — Emergency Department (HOSPITAL_COMMUNITY)
Admission: EM | Admit: 2022-05-18 | Discharge: 2022-05-18 | Disposition: A | Discharge status: Skilled Nursing Facility | Payer: Medicaid Other | Attending: Emergency Medicine | Admitting: Emergency Medicine

## 2022-05-18 DIAGNOSIS — Z5189 Encounter for other specified aftercare: Secondary | ICD-10-CM

## 2022-05-18 MED ORDER — OXYCODONE-ACETAMINOPHEN 5-325 MG PO TABS
1.0000 | ORAL_TABLET | Freq: Once | ORAL | Status: AC
  Administered 2022-05-18: 1 via ORAL
  Filled 2022-05-18: qty 1

## 2022-05-18 MED ORDER — DOXYCYCLINE HYCLATE 100 MG PO CAPS: 100.0000 mg | ORAL_CAPSULE | Freq: Two times a day (BID) | ORAL | 0 refills | Status: DC

## 2022-05-18 NOTE — ED Notes (Signed)
Patient given breakfast tray at this time. Patient awaiting [facility] to [facility].

## 2022-05-18 NOTE — Telephone Encounter (Signed)
I have reached out a couple times to get [facility] Rescheduled for her Colonoscopy and she has not returned my call yet

## 2022-05-18 NOTE — ED Notes (Signed)
Diet soda and crackers/peanut butter given per pt request

## 2022-05-18 NOTE — ED Provider Notes (Signed)
[facility] EMERGENCY DEPARTMENT Provider Note   CSN: [id number] Arrival date & time: 05/17/22  2203     History  Chief Complaint  Patient presents with   Wound Check    Regina Watson is a 56 y.o. female.  Patient presents to the emergency department for evaluation of persistent drainage from incision in the left breast.  Patient reports that she had surgery for breast cancer in August.  She was seen here at this hospital in follow-up earlier this month for abscess.  I&D was performed on September 17 and wound left open.  Patient reports that she thinks there is still something in the wound.       Home Medications Prior to Admission medications   Medication Sig Start Date End Date Taking? Authorizing Provider  doxycycline (VIBRAMYCIN) 100 MG capsule Take 1 capsule (100 mg total) by mouth 2 (two) times daily. 05/18/22  Yes Malicia Blasdel, Gwenyth Allegra, MD  acetaminophen (TYLENOL) 325 MG tablet Take 650 mg by mouth every 6 (six) hours as needed for mild pain or moderate pain.    [provider]  acetaZOLAMIDE ER (DIAMOX) 500 MG capsule Take 500 mg by mouth 2 (two) times daily.    [provider]  albuterol (VENTOLIN HFA) 108 (90 Base) MCG/ACT inhaler Inhale 2 puffs into the lungs every 4 (four) hours as needed for wheezing or shortness of breath.    [provider]  amLODipine (NORVASC) 5 MG tablet Take 1 tablet (5 mg total) by mouth daily. 01/21/21   Orson Eva, MD  aspirin EC 81 MG tablet Take 81 mg by mouth every morning.    [provider]  atorvastatin (LIPITOR) 10 MG tablet Take 10 mg by mouth every evening.     [provider]  benztropine (COGENTIN) 0.5 MG tablet Take 0.5 mg by mouth 2 (two) times daily.    [provider]  colchicine 0.6 MG tablet Take 0.6 mg by mouth daily. 08/03/21   [provider]  divalproex (DEPAKOTE ER) 500 MG 24 hr tablet Take 1,000 mg by mouth at bedtime.    [provider]  FARXIGA  10 MG TABS tablet Take 10 mg by mouth daily. 06/30/21   [provider]  furosemide (LASIX) 40 MG tablet Take 1 tablet (40 mg total) by mouth daily as needed for fluid or edema. 10/17/20   Lavina Hamman, MD  gabapentin (NEURONTIN) 400 MG capsule Take 1 capsule (400 mg total) by mouth 3 (three) times daily. Patient taking differently: Take 400 mg by mouth 4 (four) times daily. 10/17/20   Lavina Hamman, MD  hydrOXYzine (ATARAX) 25 MG tablet Take 25 mg by mouth 3 (three) times daily.    [provider]  insulin aspart (NOVOLOG FLEXPEN) 100 UNIT/ML FlexPen Inject 11-15 Units into the skin 3 (three) times daily with meals. 150-200=11units,201-250=12units, 251-300=13units, 301-350=14units, 351-400=15units    [provider]  insulin glargine (LANTUS) 100 UNIT/ML injection Inject 0.25 mLs (25 Units total) into the skin daily. 05/08/22   Johnson, Clanford L, MD  levocetirizine (XYZAL) 5 MG tablet Take 5 mg by mouth daily.    [provider]  metFORMIN (GLUCOPHAGE) 1000 MG tablet Take 1,000 mg by mouth 2 (two) times daily with a meal.    [provider]  Methylcellulose, Laxative, (GOODSENSE FIBER PO) Take 2 Scoops by mouth 3 (three) times daily. 2 tspn into 4-8 ounces of beverage    [provider]  omeprazole (PRILOSEC) 40 MG capsule Take  1 capsule (40 mg total) by mouth daily. 04/12/22   Harvel Quale, MD  ondansetron (ZOFRAN) 4 MG tablet Take 1 tablet (4 mg total) by mouth every 8 (eight) hours as needed for nausea or vomiting. 05/08/22   Johnson, Clanford L, MD  polyethylene glycol (MIRALAX / GLYCOLAX) 17 g packet Take 17 g by mouth daily.    [provider]  QUEtiapine (SEROQUEL XR) 400 MG 24 hr tablet Take 800 mg by mouth at bedtime.    [provider]  risperiDONE (RISPERDAL M-TABS) 3 MG disintegrating tablet Take 3 mg by mouth 2 (two) times daily.    [provider]  Semaglutide,0.25 or 0.'5MG'$ /DOS, (OZEMPIC, 0.25  OR 0.5 MG/DOSE,) 2 MG/1.5ML SOPN 0.25 mg by Subconjunctival route every Friday.    [provider]  sennosides-docusate sodium (SENOKOT-S) 8.6-50 MG tablet Take 1 tablet by mouth in the morning and at bedtime.    [provider]  sucralfate (CARAFATE) 1 g tablet Take 1 tablet (1 g total) by mouth 2 (two) times daily for 28 days. 04/12/22 05/10/22  Harvel Quale, MD  tiotropium (SPIRIVA) 18 MCG inhalation capsule Place 18 mcg into inhaler and inhale daily.    [provider]  traMADol (ULTRAM) 50 MG tablet Take 50 mg by mouth every 6 (six) hours as needed for severe pain.    [provider]  Vitamin D, Ergocalciferol, (DRISDOL) 1.25 MG (50000 UNIT) CAPS capsule Take 50,000 Units by mouth every Saturday.    [provider]  Wheat Dextrin (GOODSENSE BEST FIBER) POWD STIR (2) TEASPOONFUL INTO 4-8 OUNCES OF BEVERAGE OR SOFT FOOD THREE TIMES DAILY WITH MEALS. **STIR WELL UNTIL DISSOLVED.** 04/19/22   Gabriel Rung, NP      Allergies    Ziprasidone hcl, Penicillinase, Sulfa antibiotics, Bactrim [sulfamethoxazole-trimethoprim], Penicillins, and Latex    Review of Systems   Review of Systems  Physical Exam Updated Vital Signs BP 134/71 (BP Location: Left Arm)   Pulse 92   Temp 98.1 F (36.7 C) (Oral)   Resp 17   SpO2 100%  Physical Exam Vitals and nursing note reviewed.  Constitutional:      General: She is not in acute distress.    Appearance: She is well-developed.  HENT:     Head: Normocephalic and atraumatic.     Mouth/Throat:     Mouth: Mucous membranes are moist.  Eyes:     General: Vision grossly intact. Gaze aligned appropriately.     Extraocular Movements: Extraocular movements intact.     Conjunctiva/sclera: Conjunctivae normal.  Cardiovascular:     Rate and Rhythm: Normal rate and regular rhythm.     Pulses: Normal pulses.     Heart sounds: Normal heart sounds, S1 normal and S2 normal. No murmur heard.    No friction  rub. No gallop.  Pulmonary:     Effort: Pulmonary effort is normal. No respiratory distress.     Breath sounds: Normal breath sounds.  Abdominal:     General: Bowel sounds are normal.     Palpations: Abdomen is soft.     Tenderness: There is no abdominal tenderness. There is no guarding or rebound.     Hernia: No hernia is present.  Musculoskeletal:        General: No swelling.     Cervical back: Full passive range of motion without pain, normal range of motion and neck supple. No spinous process tenderness or muscular tenderness. Normal range of motion.  Right lower leg: No edema.     Left lower leg: No edema.  Skin:    General: Skin is warm and dry.     Capillary Refill: Capillary refill takes less than 2 seconds.     Findings: No ecchymosis, erythema, rash or wound.     Comments: Large surgical incision upper outer left breast.  Copious thin yellow liquid draining from wound.  Neurological:     General: No focal deficit present.     Mental Status: She is alert and oriented to person, place, and time.     GCS: GCS eye subscore is 4. GCS verbal subscore is 5. GCS motor subscore is 6.     Cranial Nerves: Cranial nerves 2-12 are intact.     Sensory: Sensation is intact.     Motor: Motor function is intact.     Coordination: Coordination is intact.  Psychiatric:        Attention and Perception: Attention normal.        Mood and Affect: Mood normal.        Speech: Speech normal.        Behavior: Behavior normal.     ED Results / Procedures / Treatments   Labs (all labs ordered are listed, but only abnormal results are displayed) Labs Reviewed  AEROBIC CULTURE W GRAM STAIN (SUPERFICIAL SPECIMEN)    EKG None  Radiology No results found.  Procedures Procedures    Medications Ordered in ED Medications - No data to display  ED Course/ Medical Decision Making/ A&P                           Medical Decision Making  Patient presents to the emergency department for  evaluation of persistent drainage from recent I&D site.  There is copious thin yellow drainage coming from the site.  This was cleaned out of the wound and I could visualize foreign body at the base of the wound.  This was grasped with forceps and removed.  It was a large wadded up piece of 4 x 4 still in the wound, likely causing inflammatory response.  Patient appears well, afebrile, vital signs normal.  Will not require repeat hospitalization, will place on antibiotics and have her follow-up with general surgery.        Final Clinical Impression(s) / ED Diagnoses Final diagnoses:  Wound check, abscess    Rx / DC Orders ED Discharge Orders          Ordered    doxycycline (VIBRAMYCIN) 100 MG capsule  2 times daily        05/18/22 0131              Orpah Greek, MD 05/18/22 [phone]

## 2022-05-18 NOTE — ED Notes (Signed)
Nonadhering dsg with ABD pad placed on left axilla wound

## 2022-05-18 NOTE — ED Notes (Signed)
Attempted to call facility for discharge transport. No answer; voicemail left

## 2022-05-18 NOTE — ED Notes (Signed)
Facility reports unable to pick pt up, pt will return by [facility]

## 2022-05-18 NOTE — ED Notes (Signed)
Pt alert, NAD, calm, steady gait to b/r, dressing self. Moved to h/w to wait for transport. Safe Transport initiated. CN aware.

## 2022-05-21 LAB — AEROBIC CULTURE W GRAM STAIN (SUPERFICIAL SPECIMEN)

## 2022-05-22 ENCOUNTER — Telehealth (HOSPITAL_BASED_OUTPATIENT_CLINIC_OR_DEPARTMENT_OTHER): Payer: Self-pay | Admitting: *Deleted

## 2022-05-22 NOTE — Progress Notes (Signed)
ED Antimicrobial Stewardship Positive Culture Follow Up   Regina Watson is an 56 y.o. female who presented to [facility] on 05/18/2022 with a chief complaint of  Chief Complaint  Patient presents with   Wound Check    Recent Results (from the past 720 hour(s))  Blood Culture (routine x 2)     Status: None   Collection Time: 05/08/22 12:30 AM   Specimen: Left Antecubital; Blood  Result Value Ref Range Status   Specimen Description LEFT ANTECUBITAL BOTTLES DRAWN AEROBIC ONLY  Final   Special Requests   Final    Blood Culture results may not be optimal due to an inadequate volume of blood received in culture bottles   Culture   Final    NO GROWTH 6 DAYS Performed at [facility], [address] [place], [place] [address]    Report Status 05/14/2022 FINAL  Final  Blood Culture (routine x 2)     Status: None   Collection Time: 05/08/22 12:42 AM   Specimen: BLOOD LEFT FOREARM  Result Value Ref Range Status   Specimen Description BLOOD LEFT FOREARM BOTTLES DRAWN AEROBIC ONLY  Final   Special Requests   Final    Blood Culture results may not be optimal due to an inadequate volume of blood received in culture bottles   Culture   Final    NO GROWTH 6 DAYS Performed at [facility], [address] [place], [place] [address]    Report Status 05/14/2022 FINAL  Final  Aerobic/Anaerobic Culture w Gram Stain (surgical/deep wound)     Status: None   Collection Time: 05/08/22 11:09 AM   Specimen: Abscess  Result Value Ref Range Status   Specimen Description   Final    ABSCESS Performed at [facility], [address], [place], [place] [address]    Special Requests   Final    NONE Performed at [facility], [address] [place], [place] [address]    Gram Stain   Final    FEW WBC PRESENT,BOTH PMN AND MONONUCLEAR NO ORGANISMS SEEN    Culture   Final    MODERATE ENTEROBACTER CLOACAE NO ANAEROBES ISOLATED Performed at [facility], [place] [address], [place], [place] [address]     Report Status 05/13/2022 FINAL  Final   Organism ID, Bacteria ENTEROBACTER CLOACAE  Final      Susceptibility   Enterobacter cloacae - MIC*    CEFAZOLIN RESISTANT Resistant     CEFEPIME <=0.12 SENSITIVE Sensitive     CEFTAZIDIME <=1 SENSITIVE Sensitive     CIPROFLOXACIN <=0.25 SENSITIVE Sensitive     GENTAMICIN <=1 SENSITIVE Sensitive     IMIPENEM <=0.25 SENSITIVE Sensitive     TRIMETH/SULFA <=20 SENSITIVE Sensitive     PIP/TAZO <=4 SENSITIVE Sensitive     * MODERATE ENTEROBACTER CLOACAE  Aerobic Culture w Gram Stain (superficial specimen)     Status: None   Collection Time: 05/18/22 12:44 AM   Specimen: Wound  Result Value Ref Range Status   Specimen Description   Final    WOUND Performed at [facility], [address] [place], [place] [address]    Special Requests   Final    NONE Performed at [facility], [address], [place], [place] [address]    Gram Stain   Final    FEW WBC PRESENT, PREDOMINANTLY PMN RARE GRAM POSITIVE COCCI IN CLUSTERS Performed at [facility], [place] [address] [place] [address] [place], [place] [address]    Culture  Final    RARE ENTEROBACTER CLOACAE RARE ENTEROBACTER AEROGENES    Report Status 05/21/2022 FINAL  Final   Organism ID, Bacteria ENTEROBACTER CLOACAE  Final   Organism ID, Bacteria ENTEROBACTER AEROGENES  Final      Susceptibility   Enterobacter aerogenes - MIC*    CEFAZOLIN >=64 RESISTANT Resistant     CEFEPIME <=0.12 SENSITIVE Sensitive     CEFTAZIDIME <=1 SENSITIVE Sensitive     CEFTRIAXONE <=0.25 SENSITIVE Sensitive     CIPROFLOXACIN <=0.25 SENSITIVE Sensitive     GENTAMICIN <=1 SENSITIVE Sensitive     IMIPENEM 1 SENSITIVE Sensitive     TRIMETH/SULFA <=20 SENSITIVE Sensitive     PIP/TAZO <=4 SENSITIVE Sensitive     * RARE ENTEROBACTER AEROGENES   Enterobacter cloacae - MIC*    CEFAZOLIN >=64 RESISTANT Resistant     CEFEPIME <=0.12 SENSITIVE Sensitive     CEFTAZIDIME <=1 SENSITIVE Sensitive     CIPROFLOXACIN <=0.25 SENSITIVE  Sensitive     GENTAMICIN <=1 SENSITIVE Sensitive     IMIPENEM <=0.25 SENSITIVE Sensitive     TRIMETH/SULFA <=20 SENSITIVE Sensitive     PIP/TAZO 8 SENSITIVE Sensitive     * RARE ENTEROBACTER CLOACAE    '[x]'$  Treated with doxycycline, organism resistant to prescribed antimicrobial Patient has several allergies to antibiotics, also has follow up with surgery.    New antibiotic prescription: Ciprofloxacin '500mg'$  by mouth every we hours for 10 days.    ED Provider: Evlyn Courier, PA-C   Kelly Splinter Regina Watson 05/22/2022, 2:05 PM Clinical Pharmacist Monday - Friday phone -  [phone] Saturday - Sunday phone - [phone]

## 2022-05-23 ENCOUNTER — Telehealth (HOSPITAL_BASED_OUTPATIENT_CLINIC_OR_DEPARTMENT_OTHER): Payer: Self-pay

## 2022-05-23 ENCOUNTER — Telehealth (HOSPITAL_BASED_OUTPATIENT_CLINIC_OR_DEPARTMENT_OTHER): Payer: Self-pay | Admitting: Emergency Medicine

## 2022-05-23 NOTE — Telephone Encounter (Signed)
Post ED Visit - Positive Culture Follow-up: Successful Patient Follow-Up  Culture assessed and recommendations reviewed by:  '[]'$  Elenor Quinones, Pharm.D. '[]'$  Heide Guile, Pharm.D., BCPS AQ-ID '[]'$  Parks Neptune, Pharm.D., BCPS '[]'$  Alycia Rossetti, Pharm.D., BCPS '[]'$  [place], [place].D., BCPS, AAHIVP '[]'$  Legrand Como, Pharm.D., BCPS, AAHIVP '[]'$  Salome Arnt, PharmD, BCPS '[]'$  Johnnette Gourd, PharmD, BCPS '[]'$  Hughes Better, PharmD, BCPS '[x]'$  Esmeralda Arthur, PharmD  Positive wound culture  '[]'$  Patient discharged without antimicrobial prescription and treatment is now indicated '[x]'$  Organism is resistant to prescribed ED discharge antimicrobial '[]'$  Patient with positive blood cultures  Changes discussed with ED provider: Evlyn Courier PA-C New antibiotic prescription Ciprofloxacin 500 mg PO every 12 hours for ten days Result with med order called and faxed to [facility]  # [phone]  Contacted patient's assisted living, date 05/22/22, time [place] 05/23/2022, 3:39 AM

## 2022-05-23 NOTE — Telephone Encounter (Signed)
Post ED Visit - Positive Culture Follow-up: Successful Patient Follow-Up  Culture assessed and recommendations reviewed by:  '[x]'$  Esmeralda Arthur, Pharm.D. '[]'$  Heide Guile, Pharm.D., BCPS AQ-ID '[]'$  Parks Neptune, Pharm.D., BCPS '[]'$  Alycia Rossetti, Pharm.D., BCPS '[]'$  [place], Pharm.D., BCPS, AAHIVP '[]'$  Legrand Como, Pharm.D., BCPS, AAHIVP '[]'$  Salome Arnt, PharmD, BCPS '[]'$  Johnnette Gourd, PharmD, BCPS '[]'$  Hughes Better, PharmD, BCPS '[]'$  Leeroy Cha, PharmD  Positive wound culture  '[]'$  Patient discharged without antimicrobial prescription and treatment is now indicated '[x]'$  Organism is resistant to prescribed ED discharge antimicrobial '[]'$  Patient with positive blood cultures  Changes discussed with ED provider: Evlyn Courier, PA-C New antibiotic prescription Ciprofloxacin 500  mg po every 12 hours x 10 days Called to [facility]  Contacted patient, date 05/23/2022, time 1:30 pm   Regina Watson 05/23/2022, 1:32 PM

## 2022-06-06 ENCOUNTER — Ambulatory Visit (INDEPENDENT_AMBULATORY_CARE_PROVIDER_SITE_OTHER): Payer: Medicaid Other | Admitting: Gastroenterology

## 2022-06-28 ENCOUNTER — Ambulatory Visit: Payer: Medicaid Other | Admitting: Podiatry

## 2022-07-05 ENCOUNTER — Ambulatory Visit (INDEPENDENT_AMBULATORY_CARE_PROVIDER_SITE_OTHER): Payer: Medicaid Other | Admitting: Podiatry

## 2022-07-05 ENCOUNTER — Encounter: Payer: Self-pay | Admitting: Podiatry

## 2022-07-05 DIAGNOSIS — M79674 Pain in right toe(s): Secondary | ICD-10-CM

## 2022-07-05 DIAGNOSIS — N179 Acute kidney failure, unspecified: Secondary | ICD-10-CM | POA: Diagnosis not present

## 2022-07-05 DIAGNOSIS — B351 Tinea unguium: Secondary | ICD-10-CM

## 2022-07-05 DIAGNOSIS — M79675 Pain in left toe(s): Secondary | ICD-10-CM

## 2022-07-05 DIAGNOSIS — E119 Type 2 diabetes mellitus without complications: Secondary | ICD-10-CM

## 2022-07-05 DIAGNOSIS — L84 Corns and callosities: Secondary | ICD-10-CM | POA: Diagnosis not present

## 2022-07-05 NOTE — Progress Notes (Signed)
This patient returns to my office for at risk foot care.  This patient requires this care by a professional since this patient will be at risk due to having diabetic neuropathy and amputation second toe left foot. This patient is unable to cut nails herself since the patient cannot reach her nails.These nails are painful walking and wearing shoes.  This patient presents for at risk foot care today.  General Appearance  Alert, conversant and in no acute stress.  Vascular  Dorsalis pedis and posterior tibial  pulses are palpable  bilaterally.  Capillary return is within normal limits  bilaterally. Temperature is within normal limits  bilaterally.  Neurologic  Senn-Weinstein monofilament wire test within normal limits  bilaterally. Muscle power within normal limits bilaterally.  Nails Thick disfigured discolored nails with subungual debris  from hallux to fifth toes bilaterally. No evidence of bacterial infection or drainage bilaterally.  Orthopedic  No limitations of motion  feet .  No crepitus or effusions noted.  No bony pathology or digital deformities noted.  Amputation second toe left foot.  Skin  normotropic skin with no porokeratosis noted bilaterally.  No signs of infections or ulcers noted.     Onychomycosis  Pain in right toes  Pain in left toes  Consent was obtained for treatment procedures.   Mechanical debridement of nails 1-5  bilaterally performed with a nail nipper.  Filed with dremel without incident.    Return office visit   3 months                   Told patient to return for periodic foot care and evaluation due to potential at risk complications.   Gardiner Barefoot DPM

## 2022-10-12 ENCOUNTER — Ambulatory Visit: Payer: Medicaid Other | Admitting: Podiatry

## 2022-10-12 ENCOUNTER — Ambulatory Visit (INDEPENDENT_AMBULATORY_CARE_PROVIDER_SITE_OTHER): Payer: Medicaid Other | Admitting: Podiatry

## 2022-10-12 ENCOUNTER — Encounter: Payer: Self-pay | Admitting: Podiatry

## 2022-10-12 DIAGNOSIS — M79674 Pain in right toe(s): Secondary | ICD-10-CM | POA: Diagnosis not present

## 2022-10-12 DIAGNOSIS — M79675 Pain in left toe(s): Secondary | ICD-10-CM | POA: Diagnosis not present

## 2022-10-12 DIAGNOSIS — B351 Tinea unguium: Secondary | ICD-10-CM

## 2022-10-12 DIAGNOSIS — E119 Type 2 diabetes mellitus without complications: Secondary | ICD-10-CM

## 2022-10-12 NOTE — Progress Notes (Signed)
This patient returns to my office for at risk foot care.  This patient requires this care by a professional since this patient will be at risk due to having diabetic neuropathy and amputation second toe left foot. This patient is unable to cut nails herself since the patient cannot reach her nails.These nails are painful walking and wearing shoes.  This patient presents for at risk foot care today.  General Appearance  Alert, conversant and in no acute stress.  Vascular  Dorsalis pedis and posterior tibial  pulses are palpable  bilaterally.  Capillary return is within normal limits  bilaterally. Temperature is within normal limits  bilaterally.  Neurologic  Senn-Weinstein monofilament wire test within normal limits  bilaterally. Muscle power within normal limits bilaterally.  Nails Thick disfigured discolored nails with subungual debris  from hallux to fifth toes bilaterally. No evidence of bacterial infection or drainage bilaterally.  Orthopedic  No limitations of motion  feet .  No crepitus or effusions noted.  No bony pathology or digital deformities noted.  Amputation second toe left foot.  Skin  normotropic skin with no porokeratosis noted bilaterally.  No signs of infections or ulcers noted.   Symptomatic callus left foot.  Onychomycosis  Pain in right toes  Pain in left toes  Consent was obtained for treatment procedures.   Mechanical debridement of nails 1-5  bilaterally performed with a nail nipper.  Filed with dremel without incident.    Return office visit   3 months                   Told patient to return for periodic foot care and evaluation due to potential at risk complications. Callus done as a courtesy.   Gardiner Barefoot DPM

## 2022-12-30 ENCOUNTER — Other Ambulatory Visit: Payer: Self-pay

## 2022-12-30 DIAGNOSIS — R1031 Right lower quadrant pain: Secondary | ICD-10-CM | POA: Insufficient documentation

## 2022-12-30 DIAGNOSIS — R1110 Vomiting, unspecified: Secondary | ICD-10-CM | POA: Diagnosis not present

## 2022-12-30 DIAGNOSIS — Z794 Long term (current) use of insulin: Secondary | ICD-10-CM | POA: Insufficient documentation

## 2022-12-30 DIAGNOSIS — Z7982 Long term (current) use of aspirin: Secondary | ICD-10-CM | POA: Insufficient documentation

## 2022-12-30 DIAGNOSIS — E1140 Type 2 diabetes mellitus with diabetic neuropathy, unspecified: Secondary | ICD-10-CM | POA: Insufficient documentation

## 2022-12-30 DIAGNOSIS — Z91040 Latex allergy status: Secondary | ICD-10-CM | POA: Insufficient documentation

## 2022-12-30 DIAGNOSIS — Z7984 Long term (current) use of oral hypoglycemic drugs: Secondary | ICD-10-CM | POA: Insufficient documentation

## 2022-12-30 NOTE — ED Triage Notes (Addendum)
Patient from [facility] home for reports of RLQ abd pain that started 1.5 hours ago. Patient reports seeing some blood in her urine earlier today. Reports N/V. Denies constipation or diarrhea; burning with urination. Upon arrival to ER, patient is alert and oriented; in no apparent distress

## 2022-12-31 ENCOUNTER — Emergency Department (HOSPITAL_COMMUNITY)
Admission: EM | Admit: 2022-12-31 | Discharge: 2022-12-31 | Disposition: A | Discharge status: Home / Self Care | Payer: Medicaid Other | Attending: Emergency Medicine | Admitting: Emergency Medicine

## 2022-12-31 ENCOUNTER — Emergency Department (HOSPITAL_COMMUNITY): Payer: Medicaid Other

## 2022-12-31 ENCOUNTER — Encounter (HOSPITAL_COMMUNITY): Payer: Self-pay

## 2022-12-31 DIAGNOSIS — R1031 Right lower quadrant pain: Secondary | ICD-10-CM

## 2022-12-31 DIAGNOSIS — R112 Nausea with vomiting, unspecified: Secondary | ICD-10-CM

## 2022-12-31 LAB — URINALYSIS, ROUTINE W REFLEX MICROSCOPIC
Bilirubin Urine: NEGATIVE
Glucose, UA: >=500 mg/dL — AB
Ketones, ur: NEGATIVE mg/dL
Leukocytes,Ua: NEGATIVE
Nitrite: NEGATIVE
Protein, ur: 30 mg/dL — AB
RBC / HPF: >50 RBC/hpf (ref 0–5)
Specific Gravity, Urine: 1.016 (ref 1.005–1.030)
pH: 7 (ref 5.0–8.0)

## 2022-12-31 LAB — CBC WITH DIFFERENTIAL/PLATELET
Abs Immature Granulocytes: 0.06 10*3/uL (ref 0.00–0.07)
Basophils Absolute: 0 10*3/uL (ref 0.0–0.1)
Basophils Relative: 0 %
Eosinophils Absolute: 0.1 10*3/uL (ref 0.0–0.5)
Eosinophils Relative: 1 %
HCT: 44.1 % (ref 36.0–46.0)
Hemoglobin: 14 g/dL (ref 12.0–15.0)
Immature Granulocytes: 1 %
Lymphocytes Relative: 7 %
Lymphs Abs: 0.7 10*3/uL (ref 0.7–4.0)
MCH: 29.9 pg (ref 26.0–34.0)
MCHC: 31.7 g/dL (ref 30.0–36.0)
MCV: 94.2 fL (ref 80.0–100.0)
Monocytes Absolute: 0.8 10*3/uL (ref 0.1–1.0)
Monocytes Relative: 8 %
Neutro Abs: 8 10*3/uL — ABNORMAL HIGH (ref 1.7–7.7)
Neutrophils Relative %: 83 %
Platelets: ADEQUATE 10*3/uL (ref 150–400)
RBC: 4.68 MIL/uL (ref 3.87–5.11)
RDW: 13.7 % (ref 11.5–15.5)
Smear Review: ADEQUATE
WBC: 9.6 10*3/uL (ref 4.0–10.5)
nRBC: 0 % (ref 0.0–0.2)

## 2022-12-31 LAB — COMPREHENSIVE METABOLIC PANEL
ALT: 15 U/L (ref 0–44)
AST: 17 U/L (ref 15–41)
Albumin: 3.5 g/dL (ref 3.5–5.0)
Alkaline Phosphatase: 85 U/L (ref 38–126)
Anion gap: 9 (ref 5–15)
BUN: 22 mg/dL — ABNORMAL HIGH (ref 6–20)
CO2: 22 mmol/L (ref 22–32)
Calcium: 8.9 mg/dL (ref 8.9–10.3)
Chloride: 109 mmol/L (ref 98–111)
Creatinine, Ser: 0.85 mg/dL (ref 0.44–1.00)
GFR, Estimated: >60 mL/min (ref >60)
Glucose, Bld: 144 mg/dL — ABNORMAL HIGH (ref 70–99)
Potassium: 3.3 mmol/L — ABNORMAL LOW (ref 3.5–5.1)
Sodium: 140 mmol/L (ref 135–145)
Total Bilirubin: 0.3 mg/dL (ref 0.3–1.2)
Total Protein: 8.1 g/dL (ref 6.5–8.1)

## 2022-12-31 LAB — LIPASE, BLOOD: Lipase: 40 U/L (ref 11–51)

## 2022-12-31 NOTE — ED Notes (Signed)
Left message at [facility] that pt is DC and needs ride home

## 2022-12-31 NOTE — ED Notes (Signed)
Up amb to [facility]  did well

## 2022-12-31 NOTE — ED Provider Notes (Signed)
[facility] AT [facility] Provider Note   CSN: [id number] Arrival date & time: 12/30/22  2353     History  Chief Complaint  Patient presents with   Abdominal Pain    Regina Watson is a 57 y.o. female.  Patient is a 57 year old female with past medical history of diabetes, renal insufficiency, hyperlipidemia, diabetic neuropathy, and schizophrenia.  Patient sent from a group home for evaluation of abdominal pain.  It was reported she was complaining of pain to her right lower abdomen.  Patient tells me she had an episode of vomiting and the pain has since subsided.  She denies any fevers or chills.  She denies any diarrhea or constipation.  She reports eating a large quantity of food earlier today and thinks this may have upset her stomach.  The history is provided by the patient.       Home Medications Prior to Admission medications   Medication Sig Start Date End Date Taking? Authorizing Provider  acetaminophen (TYLENOL) 325 MG tablet Take 650 mg by mouth every 6 (six) hours as needed for mild pain or moderate pain.    [provider]  acetaZOLAMIDE ER (DIAMOX) 500 MG capsule Take 500 mg by mouth 2 (two) times daily.    [provider]  albuterol (VENTOLIN HFA) 108 (90 Base) MCG/ACT inhaler Inhale 2 puffs into the lungs every 4 (four) hours as needed for wheezing or shortness of breath.    [provider]  amLODipine (NORVASC) 5 MG tablet Take 1 tablet (5 mg total) by mouth daily. 01/21/21   Catarina Hartshorn, MD  aspirin EC 81 MG tablet Take 81 mg by mouth every morning.    [provider]  atorvastatin (LIPITOR) 10 MG tablet Take 10 mg by mouth every evening.     [provider]  benztropine (COGENTIN) 0.5 MG tablet Take 0.5 mg by mouth 2 (two) times daily.    [provider]  colchicine 0.6 MG tablet Take 0.6 mg by mouth daily. 08/03/21   [provider]  divalproex (DEPAKOTE ER) 500 MG 24  hr tablet Take 1,000 mg by mouth at bedtime.    [provider]  doxycycline (VIBRAMYCIN) 100 MG capsule Take 1 capsule (100 mg total) by mouth 2 (two) times daily. 05/18/22   Pollina, Canary Brim, MD  FARXIGA 10 MG TABS tablet Take 10 mg by mouth daily. 06/30/21   [provider]  furosemide (LASIX) 40 MG tablet Take 1 tablet (40 mg total) by mouth daily as needed for fluid or edema. 10/17/20   Rolly Salter, MD  gabapentin (NEURONTIN) 400 MG capsule Take 1 capsule (400 mg total) by mouth 3 (three) times daily. Patient taking differently: Take 400 mg by mouth 4 (four) times daily. 10/17/20   Rolly Salter, MD  hydrOXYzine (ATARAX) 25 MG tablet Take 25 mg by mouth 3 (three) times daily.    [provider]  insulin aspart (NOVOLOG FLEXPEN) 100 UNIT/ML FlexPen Inject 11-15 Units into the skin 3 (three) times daily with meals. 150-200=11units,201-250=12units, 251-300=13units, 301-350=14units, 351-400=15units    [provider]  insulin glargine (LANTUS) 100 UNIT/ML injection Inject 0.25 mLs (25 Units total) into the skin daily. 05/08/22   Johnson, Clanford L, MD  levocetirizine (XYZAL) 5 MG tablet Take 5 mg by mouth daily.    [provider]  metFORMIN (GLUCOPHAGE) 1000 MG tablet Take 1,000 mg by mouth 2 (two) times daily with a meal.    [provider]  Methylcellulose, Laxative, (GOODSENSE FIBER PO) Take 2 Scoops by mouth 3 (three) times daily. 2 tspn into 4-8 ounces of beverage    [provider]  omeprazole (PRILOSEC) 40 MG capsule Take 1 capsule (40 mg total) by mouth daily. 04/12/22   Dolores Frame, MD  ondansetron (ZOFRAN) 4 MG tablet Take 1 tablet (4 mg total) by mouth every 8 (eight) hours as needed for nausea or vomiting. 05/08/22   Johnson, Clanford L, MD  polyethylene glycol (MIRALAX / GLYCOLAX) 17 g packet Take 17 g by mouth daily.    [provider]  QUEtiapine (SEROQUEL XR) 400 MG 24 hr tablet Take 800 mg by  mouth at bedtime.    [provider]  risperiDONE (RISPERDAL M-TABS) 3 MG disintegrating tablet Take 3 mg by mouth 2 (two) times daily.    [provider]  Semaglutide,0.25 or 0.5MG /DOS, (OZEMPIC, 0.25 OR 0.5 MG/DOSE,) 2 MG/1.5ML SOPN 0.25 mg by Subconjunctival route every Friday.    [provider]  sennosides-docusate sodium (SENOKOT-S) 8.6-50 MG tablet Take 1 tablet by mouth in the morning and at bedtime.    [provider]  sucralfate (CARAFATE) 1 g tablet Take 1 tablet (1 g total) by mouth 2 (two) times daily for 28 days. 04/12/22 05/10/22  Dolores Frame, MD  tiotropium (SPIRIVA) 18 MCG inhalation capsule Place 18 mcg into inhaler and inhale daily.    [provider]  traMADol (ULTRAM) 50 MG tablet Take 50 mg by mouth every 6 (six) hours as needed for severe pain.    [provider]  Vitamin D, Ergocalciferol, (DRISDOL) 1.25 MG (50000 UNIT) CAPS capsule Take 50,000 Units by mouth every Saturday.    [provider]  Wheat Dextrin (GOODSENSE BEST FIBER) POWD STIR (2) TEASPOONFUL INTO 4-8 OUNCES OF BEVERAGE OR SOFT FOOD THREE TIMES DAILY WITH MEALS. **STIR WELL UNTIL DISSOLVED.** 04/19/22   Raquel James, NP      Allergies    Ziprasidone hcl, Penicillinase, Sulfa antibiotics, Sulfamethoxazole-trimethoprim, Penicillins, and Latex    Review of Systems   Review of Systems  All other systems reviewed and are negative.   Physical Exam Updated Vital Signs BP (!) 156/92   Pulse (!) 101   Temp (!) 97 F (36.1 C) (Oral)   Resp 16   Ht 5\' 9"  (1.753 m)   Wt 87.1 kg   SpO2 96%   BMI 28.35 kg/m  Physical Exam Vitals and nursing note reviewed.  Constitutional:      General: She is not in acute distress.    Appearance: She is well-developed. She is not diaphoretic.  HENT:     Head: Normocephalic and atraumatic.  Cardiovascular:     Rate and Rhythm: Normal rate and regular rhythm.     Heart sounds: No murmur  heard.    No friction rub. No gallop.  Pulmonary:     Effort: Pulmonary effort is normal. No respiratory distress.     Breath sounds: Normal breath sounds. No wheezing.  Abdominal:     General: Bowel sounds are normal. There is no distension.     Palpations: Abdomen is soft.     Tenderness: There is no abdominal tenderness.     Comments: Abdomen is nontender, but somewhat tympanitic.  Musculoskeletal:        General: Normal range of motion.     Cervical back: Normal range of motion and neck supple.  Skin:    General: Skin is warm and dry.  Neurological:     General: No focal deficit present.     Mental Status: She is alert and oriented to person, place, and time.     ED Results / Procedures / Treatments   Labs (all labs ordered are listed, but only abnormal results are displayed) Labs Reviewed - No data to display  EKG None  Radiology No results found.  Procedures Procedures    Medications Ordered in ED Medications - No data to display  ED Course/ Medical Decision Making/ A&P  Patient is a 57 year old female with past medical history as per HPI sent from her group home for evaluation of abdominal pain.  This seemed to resolve after she vomited.  At the time of my evaluation, patient is pain-free and physical examination is unremarkable.  Vitals are stable with no fever.  Workup initiated including CBC, CMP, lipase, all of which are unremarkable.  Urinalysis shows no evidence for infection.  CT scan of the abdomen and pelvis obtained showing findings which may represent pyelonephritis, however urinalysis does not support this.  There is no other acute pathology identified in the abdomen.  Patient has been observed for several hours and has had no further vomiting.  Upon my reassessment, abdomen remains benign and she is not complaining of any discomfort.  At this point, I feel as though she can safely be discharged with as needed return.  Because of the abdominal pain  unclear, but could be related to a viral etiology.  Final Clinical Impression(s) / ED Diagnoses Final diagnoses:  None    Rx / DC Orders ED Discharge Orders     None         Geoffery Lyons, MD 12/31/22 [phone]

## 2022-12-31 NOTE — ED Notes (Signed)
Pt denies any abd pain to me.  Stated once she vomitied all her food up that she felt better.  No distress

## 2022-12-31 NOTE — Discharge Instructions (Signed)
Return to the emergency department if you develop worsening pain, high fevers, bloody stools, or for other new and concerning symptoms.

## 2022-12-31 NOTE — ED Notes (Signed)
Pt sleeping at intervals.  No distress.  Denies any nausea or abd pain

## 2022-12-31 NOTE — ED Notes (Signed)
Pt sleeping.  [facility] contacted and they will send Zenaida Niece to pick her up after breakfast. I spoke to Sigurd

## 2023-01-01 ENCOUNTER — Encounter (HOSPITAL_COMMUNITY): Payer: Self-pay

## 2023-01-01 ENCOUNTER — Other Ambulatory Visit: Payer: Self-pay

## 2023-01-01 ENCOUNTER — Inpatient Hospital Stay (HOSPITAL_COMMUNITY)
Admission: EM | Admit: 2023-01-01 | Discharge: 2023-01-04 | DRG: 872 | Disposition: A | Discharge status: Home / Self Care | Payer: Medicaid Other | Attending: Family Medicine | Admitting: Family Medicine

## 2023-01-01 ENCOUNTER — Emergency Department (HOSPITAL_COMMUNITY): Payer: Medicaid Other

## 2023-01-01 DIAGNOSIS — F209 Schizophrenia, unspecified: Secondary | ICD-10-CM | POA: Diagnosis present

## 2023-01-01 DIAGNOSIS — Z720 Tobacco use: Secondary | ICD-10-CM | POA: Diagnosis not present

## 2023-01-01 DIAGNOSIS — E876 Hypokalemia: Secondary | ICD-10-CM | POA: Diagnosis present

## 2023-01-01 DIAGNOSIS — Z7985 Long-term (current) use of injectable non-insulin antidiabetic drugs: Secondary | ICD-10-CM | POA: Diagnosis not present

## 2023-01-01 DIAGNOSIS — F99 Mental disorder, not otherwise specified: Secondary | ICD-10-CM | POA: Diagnosis not present

## 2023-01-01 DIAGNOSIS — I10 Essential (primary) hypertension: Secondary | ICD-10-CM | POA: Diagnosis not present

## 2023-01-01 DIAGNOSIS — R600 Localized edema: Secondary | ICD-10-CM | POA: Diagnosis not present

## 2023-01-01 DIAGNOSIS — Z833 Family history of diabetes mellitus: Secondary | ICD-10-CM | POA: Diagnosis not present

## 2023-01-01 DIAGNOSIS — Z888 Allergy status to other drugs, medicaments and biological substances status: Secondary | ICD-10-CM

## 2023-01-01 DIAGNOSIS — N10 Acute tubulo-interstitial nephritis: Secondary | ICD-10-CM | POA: Diagnosis present

## 2023-01-01 DIAGNOSIS — Z1152 Encounter for screening for COVID-19: Secondary | ICD-10-CM

## 2023-01-01 DIAGNOSIS — E1140 Type 2 diabetes mellitus with diabetic neuropathy, unspecified: Secondary | ICD-10-CM | POA: Diagnosis present

## 2023-01-01 DIAGNOSIS — B9689 Other specified bacterial agents as the cause of diseases classified elsewhere: Secondary | ICD-10-CM | POA: Diagnosis present

## 2023-01-01 DIAGNOSIS — R509 Fever, unspecified: Secondary | ICD-10-CM | POA: Diagnosis present

## 2023-01-01 DIAGNOSIS — R634 Abnormal weight loss: Secondary | ICD-10-CM

## 2023-01-01 DIAGNOSIS — I5022 Chronic systolic (congestive) heart failure: Secondary | ICD-10-CM | POA: Diagnosis present

## 2023-01-01 DIAGNOSIS — I110 Hypertensive heart disease with heart failure: Secondary | ICD-10-CM | POA: Diagnosis present

## 2023-01-01 DIAGNOSIS — N12 Tubulo-interstitial nephritis, not specified as acute or chronic: Principal | ICD-10-CM

## 2023-01-01 DIAGNOSIS — Z882 Allergy status to sulfonamides status: Secondary | ICD-10-CM | POA: Diagnosis not present

## 2023-01-01 DIAGNOSIS — Z880 Allergy status to penicillin: Secondary | ICD-10-CM

## 2023-01-01 DIAGNOSIS — Z794 Long term (current) use of insulin: Secondary | ICD-10-CM | POA: Diagnosis not present

## 2023-01-01 DIAGNOSIS — Z79899 Other long term (current) drug therapy: Secondary | ICD-10-CM

## 2023-01-01 DIAGNOSIS — E785 Hyperlipidemia, unspecified: Secondary | ICD-10-CM | POA: Diagnosis present

## 2023-01-01 DIAGNOSIS — R319 Hematuria, unspecified: Secondary | ICD-10-CM | POA: Diagnosis present

## 2023-01-01 DIAGNOSIS — E119 Type 2 diabetes mellitus without complications: Secondary | ICD-10-CM

## 2023-01-01 DIAGNOSIS — A4159 Other Gram-negative sepsis: Principal | ICD-10-CM | POA: Diagnosis present

## 2023-01-01 DIAGNOSIS — N390 Urinary tract infection, site not specified: Secondary | ICD-10-CM | POA: Diagnosis not present

## 2023-01-01 DIAGNOSIS — F17210 Nicotine dependence, cigarettes, uncomplicated: Secondary | ICD-10-CM | POA: Diagnosis present

## 2023-01-01 DIAGNOSIS — Z91040 Latex allergy status: Secondary | ICD-10-CM | POA: Diagnosis not present

## 2023-01-01 DIAGNOSIS — A419 Sepsis, unspecified organism: Secondary | ICD-10-CM | POA: Diagnosis not present

## 2023-01-01 DIAGNOSIS — Z7984 Long term (current) use of oral hypoglycemic drugs: Secondary | ICD-10-CM

## 2023-01-01 DIAGNOSIS — Z853 Personal history of malignant neoplasm of breast: Secondary | ICD-10-CM

## 2023-01-01 DIAGNOSIS — Z8249 Family history of ischemic heart disease and other diseases of the circulatory system: Secondary | ICD-10-CM

## 2023-01-01 DIAGNOSIS — Z7982 Long term (current) use of aspirin: Secondary | ICD-10-CM | POA: Diagnosis not present

## 2023-01-01 DIAGNOSIS — R6520 Severe sepsis without septic shock: Secondary | ICD-10-CM | POA: Diagnosis not present

## 2023-01-01 DIAGNOSIS — E782 Mixed hyperlipidemia: Secondary | ICD-10-CM | POA: Diagnosis not present

## 2023-01-01 DIAGNOSIS — G9341 Metabolic encephalopathy: Secondary | ICD-10-CM | POA: Diagnosis not present

## 2023-01-01 LAB — RESP PANEL BY RT-PCR (RSV, FLU A&B, COVID)  RVPGX2
Influenza A by PCR: NEGATIVE
Influenza B by PCR: NEGATIVE
Resp Syncytial Virus by PCR: NEGATIVE
SARS Coronavirus 2 by RT PCR: NEGATIVE

## 2023-01-01 LAB — CBC WITH DIFFERENTIAL/PLATELET
Abs Immature Granulocytes: 0.06 10*3/uL (ref 0.00–0.07)
Basophils Absolute: 0 10*3/uL (ref 0.0–0.1)
Basophils Relative: 0 %
Eosinophils Absolute: 0 10*3/uL (ref 0.0–0.5)
Eosinophils Relative: 0 %
HCT: 38.9 % (ref 36.0–46.0)
Hemoglobin: 12.4 g/dL (ref 12.0–15.0)
Immature Granulocytes: 0 %
Lymphocytes Relative: 3 %
Lymphs Abs: 0.4 10*3/uL — ABNORMAL LOW (ref 0.7–4.0)
MCH: 29.3 pg (ref 26.0–34.0)
MCHC: 31.9 g/dL (ref 30.0–36.0)
MCV: 92 fL (ref 80.0–100.0)
Monocytes Absolute: 1.2 10*3/uL — ABNORMAL HIGH (ref 0.1–1.0)
Monocytes Relative: 9 %
Neutro Abs: 12.3 10*3/uL — ABNORMAL HIGH (ref 1.7–7.7)
Neutrophils Relative %: 88 %
Platelets: 190 10*3/uL (ref 150–400)
RBC: 4.23 MIL/uL (ref 3.87–5.11)
RDW: 13.5 % (ref 11.5–15.5)
WBC: 14 10*3/uL — ABNORMAL HIGH (ref 4.0–10.5)
nRBC: 0 % (ref 0.0–0.2)

## 2023-01-01 LAB — URINALYSIS, W/ REFLEX TO CULTURE (INFECTION SUSPECTED)
Bilirubin Urine: NEGATIVE
Glucose, UA: >=500 mg/dL — AB
Ketones, ur: 5 mg/dL — AB
Leukocytes,Ua: NEGATIVE
Nitrite: NEGATIVE
Protein, ur: 30 mg/dL — AB
Specific Gravity, Urine: 1.028 (ref 1.005–1.030)
pH: 5 (ref 5.0–8.0)

## 2023-01-01 LAB — COMPREHENSIVE METABOLIC PANEL
ALT: 14 U/L (ref 0–44)
AST: 12 U/L — ABNORMAL LOW (ref 15–41)
Albumin: 3.1 g/dL — ABNORMAL LOW (ref 3.5–5.0)
Alkaline Phosphatase: 81 U/L (ref 38–126)
Anion gap: 8 (ref 5–15)
BUN: 24 mg/dL — ABNORMAL HIGH (ref 6–20)
CO2: 21 mmol/L — ABNORMAL LOW (ref 22–32)
Calcium: 8.8 mg/dL — ABNORMAL LOW (ref 8.9–10.3)
Chloride: 108 mmol/L (ref 98–111)
Creatinine, Ser: 0.91 mg/dL (ref 0.44–1.00)
GFR, Estimated: >60 mL/min (ref >60)
Glucose, Bld: 137 mg/dL — ABNORMAL HIGH (ref 70–99)
Potassium: 3.2 mmol/L — ABNORMAL LOW (ref 3.5–5.1)
Sodium: 137 mmol/L (ref 135–145)
Total Bilirubin: 0.7 mg/dL (ref 0.3–1.2)
Total Protein: 7.5 g/dL (ref 6.5–8.1)

## 2023-01-01 LAB — PROTIME-INR
INR: 1.2 (ref 0.8–1.2)
Prothrombin Time: 15.6 seconds — ABNORMAL HIGH (ref 11.4–15.2)

## 2023-01-01 LAB — LACTIC ACID, PLASMA
Lactic Acid, Venous: 1.4 mmol/L (ref 0.5–1.9)
Lactic Acid, Venous: 1.6 mmol/L (ref 0.5–1.9)

## 2023-01-01 LAB — APTT: aPTT: 31 seconds (ref 24–36)

## 2023-01-01 LAB — POC URINE PREG, ED: Preg Test, Ur: NEGATIVE

## 2023-01-01 MED ORDER — ACETAMINOPHEN 325 MG PO TABS: 650.0000 mg | ORAL_TABLET | Freq: Four times a day (QID) | ORAL | Status: DC | PRN

## 2023-01-01 MED ORDER — DAPAGLIFLOZIN PROPANEDIOL 10 MG PO TABS: 10.0000 mg | ORAL_TABLET | Freq: Every day | ORAL | Status: DC

## 2023-01-01 MED ORDER — SODIUM CHLORIDE 0.9 % IV SOLN
2.0000 g | INTRAVENOUS | Status: DC
  Administered 2023-01-01 – 2023-01-03 (×3): 2 g via INTRAVENOUS
  Filled 2023-01-01 (×3): qty 20

## 2023-01-01 MED ORDER — ONDANSETRON HCL 4 MG PO TABS: 4.0000 mg | ORAL_TABLET | Freq: Four times a day (QID) | ORAL | Status: DC | PRN

## 2023-01-01 MED ORDER — LACTATED RINGERS IV BOLUS
1000.0000 mL | Freq: Once | INTRAVENOUS | Status: AC
  Administered 2023-01-01: 1000 mL via INTRAVENOUS

## 2023-01-01 MED ORDER — ATORVASTATIN CALCIUM 10 MG PO TABS
10.0000 mg | ORAL_TABLET | Freq: Every evening | ORAL | Status: DC
  Administered 2023-01-02 – 2023-01-04 (×3): 10 mg via ORAL
  Filled 2023-01-01 (×3): qty 1

## 2023-01-01 MED ORDER — POTASSIUM CHLORIDE 20 MEQ PO PACK
40.0000 meq | PACK | Freq: Once | ORAL | Status: AC
  Administered 2023-01-02: 40 meq via ORAL
  Filled 2023-01-01: qty 2

## 2023-01-01 MED ORDER — INSULIN DETEMIR 100 UNIT/ML ~~LOC~~ SOLN
20.0000 [IU] | Freq: Every day | SUBCUTANEOUS | Status: DC
  Filled 2023-01-01: qty 0.2

## 2023-01-01 MED ORDER — ACETAMINOPHEN 650 MG RE SUPP: 650.0000 mg | Freq: Four times a day (QID) | RECTAL | Status: DC | PRN

## 2023-01-01 MED ORDER — LACTATED RINGERS IV SOLN: INTRAVENOUS | Status: DC

## 2023-01-01 MED ORDER — RISPERIDONE 1 MG PO TBDP
3.0000 mg | ORAL_TABLET | Freq: Two times a day (BID) | ORAL | Status: DC
  Administered 2023-01-02 – 2023-01-04 (×6): 3 mg via ORAL
  Filled 2023-01-01 (×4): qty 1
  Filled 2023-01-01: qty 3
  Filled 2023-01-01: qty 1
  Filled 2023-01-01: qty 3
  Filled 2023-01-01: qty 1
  Filled 2023-01-01 (×2): qty 3

## 2023-01-01 MED ORDER — OXYCODONE HCL 5 MG PO TABS
5.0000 mg | ORAL_TABLET | ORAL | Status: DC | PRN
  Administered 2023-01-04: 5 mg via ORAL
  Filled 2023-01-01: qty 1

## 2023-01-01 MED ORDER — HEPARIN SODIUM (PORCINE) 5000 UNIT/ML IJ SOLN
5000.0000 [IU] | Freq: Three times a day (TID) | INTRAMUSCULAR | Status: DC
  Administered 2023-01-02 – 2023-01-04 (×8): 5000 [IU] via SUBCUTANEOUS
  Filled 2023-01-01 (×8): qty 1

## 2023-01-01 MED ORDER — QUETIAPINE FUMARATE ER 200 MG PO TB24
800.0000 mg | ORAL_TABLET | Freq: Every day | ORAL | Status: DC
  Administered 2023-01-02 – 2023-01-03 (×2): 800 mg via ORAL
  Filled 2023-01-01: qty 2
  Filled 2023-01-01: qty 4
  Filled 2023-01-01 (×2): qty 2
  Filled 2023-01-01: qty 4

## 2023-01-01 MED ORDER — ONDANSETRON HCL 4 MG/2ML IJ SOLN: 4.0000 mg | Freq: Four times a day (QID) | INTRAMUSCULAR | Status: DC | PRN

## 2023-01-01 MED ORDER — ASPIRIN 81 MG PO TBEC
81.0000 mg | DELAYED_RELEASE_TABLET | Freq: Every morning | ORAL | Status: DC
  Administered 2023-01-02: 81 mg via ORAL
  Filled 2023-01-01: qty 1

## 2023-01-01 MED ORDER — HYDROXYZINE HCL 25 MG PO TABS
25.0000 mg | ORAL_TABLET | Freq: Three times a day (TID) | ORAL | Status: DC
  Administered 2023-01-02 – 2023-01-04 (×9): 25 mg via ORAL
  Filled 2023-01-01 (×9): qty 1

## 2023-01-01 MED ORDER — AMLODIPINE BESYLATE 5 MG PO TABS
5.0000 mg | ORAL_TABLET | Freq: Every day | ORAL | Status: DC
  Administered 2023-01-02 – 2023-01-04 (×3): 5 mg via ORAL
  Filled 2023-01-01 (×3): qty 1

## 2023-01-01 MED ORDER — ACETAZOLAMIDE ER 500 MG PO CP12
500.0000 mg | ORAL_CAPSULE | Freq: Two times a day (BID) | ORAL | Status: DC
  Administered 2023-01-02 – 2023-01-04 (×6): 500 mg via ORAL
  Filled 2023-01-01 (×10): qty 1

## 2023-01-01 NOTE — ED Notes (Signed)
Unable to get second IV 

## 2023-01-01 NOTE — ED Notes (Signed)
EMS gave 1000mg of Tylenol en route. 

## 2023-01-01 NOTE — Sepsis Progress Note (Signed)
Elink following code sepsis °

## 2023-01-01 NOTE — ED Triage Notes (Signed)
Pt seen recently at this facility for RLQ pain and now has a fever of 102.6.

## 2023-01-01 NOTE — ED Provider Notes (Signed)
[place] EMERGENCY DEPARTMENT AT [facility] Provider Note   CSN: [id number] Arrival date & time: 01/01/23  1843     History  Chief Complaint  Patient presents with   Fever    Regina Watson is a 57 y.o. female.   Fever Patient presents with fever abdominal pain.  Had been seen yesterday for abdominal pain but did not have fever.  Workup reportedly reassuring at that time.  Reviewed notes and CT scan show potential pyelonephritis reportedly was not have dysuria yesterday.  Now states she is having some pain with urination.  Fever 103.1.  Got Tylenol by EMS.    Past Surgical History:  Procedure Laterality Date   BIOPSY  04/12/2022   Procedure: BIOPSY;  Surgeon: Dolores Frame, MD;  Location: [facility];  Service: Gastroenterology;;   COLONOSCOPY WITH PROPOFOL N/A 04/12/2022   Procedure: COLONOSCOPY WITH PROPOFOL;  Surgeon: Dolores Frame, MD;  Location: [facility];  Service: Gastroenterology;  Laterality: N/A;  1230 [facility]SA [facility]   ESOPHAGOGASTRODUODENOSCOPY (EGD) WITH PROPOFOL N/A 04/12/2022   Procedure: ESOPHAGOGASTRODUODENOSCOPY (EGD) WITH PROPOFOL;  Surgeon: Dolores Frame, MD;  Location: [facility];  Service: Gastroenterology;  Laterality: N/A;   INCISION AND DRAINAGE ABSCESS Left 05/08/2022   Procedure: INCISION AND DRAINAGE ABSCESS of left breast abscess;  Surgeon: Lewie Chamber, DO;  Location: [facility] ORS;  Service: General;  Laterality: Left;   TOE AMPUTATION      Home Medications Prior to Admission medications   Medication Sig Start Date End Date Taking? Authorizing Provider  acetaminophen (TYLENOL) 325 MG tablet Take 650 mg by mouth every 6 (six) hours as needed for mild pain or moderate pain.    [provider]  acetaZOLAMIDE ER (DIAMOX) 500 MG capsule Take 500 mg by mouth 2 (two) times daily.    [provider]  albuterol (VENTOLIN HFA) 108 (90 Base) MCG/ACT  inhaler Inhale 2 puffs into the lungs every 4 (four) hours as needed for wheezing or shortness of breath.    [provider]  amLODipine (NORVASC) 5 MG tablet Take 1 tablet (5 mg total) by mouth daily. 01/21/21   Catarina Hartshorn, MD  aspirin EC 81 MG tablet Take 81 mg by mouth every morning.    [provider]  atorvastatin (LIPITOR) 10 MG tablet Take 10 mg by mouth every evening.     [provider]  benztropine (COGENTIN) 0.5 MG tablet Take 0.5 mg by mouth 2 (two) times daily.    [provider]  colchicine 0.6 MG tablet Take 0.6 mg by mouth daily. 08/03/21   [provider]  divalproex (DEPAKOTE ER) 500 MG 24 hr tablet Take 1,000 mg by mouth at bedtime.    [provider]  doxycycline (VIBRAMYCIN) 100 MG capsule Take 1 capsule (100 mg total) by mouth 2 (two) times daily. 05/18/22   Pollina, Canary Brim, MD  FARXIGA 10 MG TABS tablet Take 10 mg by mouth daily. 06/30/21   [provider]  furosemide (LASIX) 40 MG tablet Take 1 tablet (40 mg total) by mouth daily as needed for fluid or edema. 10/17/20   Rolly Salter, MD  gabapentin (NEURONTIN) 400 MG capsule Take 1 capsule (400 mg total) by mouth 3 (three) times daily. Patient taking differently: Take 400 mg by mouth 4 (four) times daily. 10/17/20   Rolly Salter, MD  hydrOXYzine (ATARAX) 25 MG tablet Take 25 mg by mouth 3 (three)  times daily.    [provider]  insulin aspart (NOVOLOG FLEXPEN) 100 UNIT/ML FlexPen Inject 11-15 Units into the skin 3 (three) times daily with meals. 150-200=11units,201-250=12units, 251-300=13units, 301-350=14units, 351-400=15units    [provider]  insulin glargine (LANTUS) 100 UNIT/ML injection Inject 0.25 mLs (25 Units total) into the skin daily. 05/08/22   Johnson, Clanford L, MD  levocetirizine (XYZAL) 5 MG tablet Take 5 mg by mouth daily.    [provider]  metFORMIN (GLUCOPHAGE) 1000 MG tablet Take 1,000 mg by mouth 2 (two)  times daily with a meal.    [provider]  Methylcellulose, Laxative, (GOODSENSE FIBER PO) Take 2 Scoops by mouth 3 (three) times daily. 2 tspn into 4-8 ounces of beverage    [provider]  omeprazole (PRILOSEC) 40 MG capsule Take 1 capsule (40 mg total) by mouth daily. 04/12/22   Dolores Frame, MD  ondansetron (ZOFRAN) 4 MG tablet Take 1 tablet (4 mg total) by mouth every 8 (eight) hours as needed for nausea or vomiting. 05/08/22   Johnson, Clanford L, MD  polyethylene glycol (MIRALAX / GLYCOLAX) 17 g packet Take 17 g by mouth daily.    [provider]  QUEtiapine (SEROQUEL XR) 400 MG 24 hr tablet Take 800 mg by mouth at bedtime.    [provider]  risperiDONE (RISPERDAL M-TABS) 3 MG disintegrating tablet Take 3 mg by mouth 2 (two) times daily.    [provider]  Semaglutide,0.25 or 0.5MG /DOS, (OZEMPIC, 0.25 OR 0.5 MG/DOSE,) 2 MG/1.5ML SOPN 0.25 mg by Subconjunctival route every Friday.    [provider]  sennosides-docusate sodium (SENOKOT-S) 8.6-50 MG tablet Take 1 tablet by mouth in the morning and at bedtime.    [provider]  sucralfate (CARAFATE) 1 g tablet Take 1 tablet (1 g total) by mouth 2 (two) times daily for 28 days. 04/12/22 05/10/22  Dolores Frame, MD  tiotropium (SPIRIVA) 18 MCG inhalation capsule Place 18 mcg into inhaler and inhale daily.    [provider]  traMADol (ULTRAM) 50 MG tablet Take 50 mg by mouth every 6 (six) hours as needed for severe pain.    [provider]  Vitamin D, Ergocalciferol, (DRISDOL) 1.25 MG (50000 UNIT) CAPS capsule Take 50,000 Units by mouth every Saturday.    [provider]  Wheat Dextrin (GOODSENSE BEST FIBER) POWD STIR (2) TEASPOONFUL INTO 4-8 OUNCES OF BEVERAGE OR SOFT FOOD THREE TIMES DAILY WITH MEALS. **STIR WELL UNTIL DISSOLVED.** 04/19/22   Raquel James, NP      Allergies    Ziprasidone hcl, Penicillinase, Sulfa  antibiotics, Sulfamethoxazole-trimethoprim, Penicillins, and Latex    Review of Systems   Review of Systems  Constitutional:  Positive for fever.    Physical Exam Updated Vital Signs BP 106/62   Pulse (!) 110   Temp 98.8 F (37.1 C)   Resp 16   Ht 5\' 9"  (1.753 m)   Wt 87 kg   SpO2 97%   BMI 28.32 kg/m  Physical Exam Vitals and nursing note reviewed.  HENT:     Head: Normocephalic.  Eyes:     Pupils: Pupils are equal, round, and reactive to light.  Cardiovascular:     Rate and Rhythm: Tachycardia present.  Abdominal:     Tenderness: There is abdominal tenderness.     Comments: Mild abdominal tenderness without rebound or guarding.  No hernia palpated.  Skin:    General: Skin is warm.     Capillary  Refill: Capillary refill takes less than 2 seconds.  Neurological:     Mental Status: She is alert. Mental status is at baseline.     ED Results / Procedures / Treatments   Labs (all labs ordered are listed, but only abnormal results are displayed) Labs Reviewed  URINALYSIS, W/ REFLEX TO CULTURE (INFECTION SUSPECTED) - Abnormal; Notable for the following components:      Result Value   APPearance HAZY (*)    Glucose, UA >=500 (*)    Hgb urine dipstick SMALL (*)    Ketones, ur 5 (*)    Protein, ur 30 (*)    Bacteria, UA RARE (*)    All other components within normal limits  COMPREHENSIVE METABOLIC PANEL - Abnormal; Notable for the following components:   Potassium 3.2 (*)    CO2 21 (*)    Glucose, Bld 137 (*)    BUN 24 (*)    Calcium 8.8 (*)    Albumin 3.1 (*)    AST 12 (*)    All other components within normal limits  CBC WITH DIFFERENTIAL/PLATELET - Abnormal; Notable for the following components:   WBC 14.0 (*)    Neutro Abs 12.3 (*)    Lymphs Abs 0.4 (*)    Monocytes Absolute 1.2 (*)    All other components within normal limits  PROTIME-INR - Abnormal; Notable for the following components:   Prothrombin Time 15.6 (*)    All other components within normal  limits  RESP PANEL BY RT-PCR (RSV, FLU A&B, COVID)  [id number]  CULTURE, BLOOD (ROUTINE X 2)  CULTURE, BLOOD (ROUTINE X 2)  URINE CULTURE  LACTIC ACID, PLASMA  APTT  LACTIC ACID, PLASMA  POC URINE PREG, ED    EKG None  Radiology DG Chest Port 1 View  Result Date: 01/01/2023 CLINICAL DATA:  Possible sepsis. EXAM: PORTABLE CHEST 1 VIEW COMPARISON:  Chest radiograph 01/17/2021 FINDINGS: Normal cardiomediastinal contours. There is diffuse bilateral mild coarsening of the interstitium which is similar to prior. No new focal consolidation. No pneumothorax or pleural effusion. No acute finding in the visualized skeleton. IMPRESSION: 1. No acute cardiopulmonary finding. 2. Diffuse bilateral mild coarsening of the interstitium is similar to prior and likely chronic. Electronically Signed   By: Emmaline Kluver M.D.   On: 01/01/2023 19:25   CT ABDOMEN PELVIS WO CONTRAST  Result Date: 12/31/2022 CLINICAL DATA:  Right lower quadrant pain. EXAM: CT ABDOMEN AND PELVIS WITHOUT CONTRAST TECHNIQUE: Multidetector CT imaging of the abdomen and pelvis was performed following the standard protocol without IV contrast. RADIATION DOSE REDUCTION: This exam was performed according to the departmental dose-optimization program which includes automated exposure control, adjustment of the mA and/or kV according to patient size and/or use of iterative reconstruction technique. COMPARISON:  Jan 17, 2021 FINDINGS: Lower chest: No acute abnormality. Hepatobiliary: No focal liver abnormality is seen. The gallbladder is moderately distended. No gallstones, gallbladder wall thickening, or biliary dilatation. Pancreas: Unremarkable. No pancreatic ductal dilatation or surrounding inflammatory changes. Spleen: Normal in size without focal abnormality. Adrenals/Urinary Tract: Adrenal glands are unremarkable. Kidneys are normal in size, without renal calculi, focal lesion, or hydronephrosis. Mild right-sided perinephric inflammatory fat  stranding is seen. The urinary bladder is markedly distended and is otherwise unremarkable. Stomach/Bowel: Stomach is within normal limits. Appendix appears normal. Stool is seen throughout the large bowel. No evidence of bowel wall thickening, distention, or inflammatory changes. Vascular/Lymphatic: Aortic atherosclerosis. No enlarged abdominal or pelvic lymph nodes. Reproductive: A stable heterogeneous posterior uterine  fibroid is noted. The bilateral adnexa are unremarkable. Other: No abdominal wall hernia or abnormality. No abdominopelvic ascites. Musculoskeletal: No acute or significant osseous findings. IMPRESSION: 1. Findings which may represent sequelae associated with acute right-sided pyelonephritis. Correlation with urinalysis is recommended. 2. Stable heterogeneous posterior uterine fibroid. 3. Aortic atherosclerosis. Aortic Atherosclerosis (ICD10-I70.0). Electronically Signed   By: Aram Candela M.D.   On: 12/31/2022 03:11    Procedures Procedures    Medications Ordered in ED Medications  lactated ringers infusion ( Intravenous New Bag/Given 01/01/23 2055)  cefTRIAXone (ROCEPHIN) 2 g in sodium chloride 0.9 % 100 mL IVPB (0 g Intravenous Stopped 01/01/23 2007)  lactated ringers bolus 1,000 mL (1,000 mLs Intravenous New Bag/Given 01/01/23 1938)  lactated ringers bolus 1,000 mL (0 mLs Intravenous Stopped 01/01/23 2055)    ED Course/ Medical Decision Making/ A&P                             Medical Decision Making Amount and/or Complexity of Data Reviewed Labs: ordered. Radiology: ordered. ECG/medicine tests: ordered.  Risk Prescription drug management.   Patient with fever.  Abdominal pain yesterday.  Had CT scan yesterday.  Reviewed results that did show potentially pyelonephritis although urine at that time not considered to be infectious.  Although did have some white cells and bacteria.  No leukocyte Estrace or nitrite.  Culture not been sent.  Now with fever and abdominal  pain she states she does have some dysuria.  States however the belly is feeling better than before.  No cough.  Will at this point assume it was a UTI.  Will get basic blood work.  Will give fluid boluses.  Does have tachycardia but not hypotension.  Will get culture of urine along with urinalysis.  Rocephin given.  Has penicillin allergy but appears to have tolerated Rocephin in the past.  Likely admission to hospital.  Urine has 6-10 white cells and rare bacteria.  Heart rate is improving.  Blood pressure still maintained.  Lactic acid normal.  Does not appear to be severe sepsis at this time.  Does not appear to have endorgan damage.  I think urine is still the most likely source with her dysuria and the urine findings both yesterday and today.  Culture has been sent since it will not reflex due to lower white cells today.  Rocephin given.  Will discuss with hospitalist for admission.        Final Clinical Impression(s) / ED Diagnoses Final diagnoses:  Pyelonephritis    Rx / DC Orders ED Discharge Orders     None         Benjiman Core, MD 01/01/23 2127

## 2023-01-02 ENCOUNTER — Inpatient Hospital Stay (HOSPITAL_COMMUNITY): Payer: Medicaid Other

## 2023-01-02 DIAGNOSIS — E119 Type 2 diabetes mellitus without complications: Secondary | ICD-10-CM

## 2023-01-02 DIAGNOSIS — Z720 Tobacco use: Secondary | ICD-10-CM

## 2023-01-02 DIAGNOSIS — E782 Mixed hyperlipidemia: Secondary | ICD-10-CM | POA: Diagnosis not present

## 2023-01-02 DIAGNOSIS — R600 Localized edema: Secondary | ICD-10-CM

## 2023-01-02 DIAGNOSIS — A419 Sepsis, unspecified organism: Secondary | ICD-10-CM | POA: Diagnosis not present

## 2023-01-02 DIAGNOSIS — R319 Hematuria, unspecified: Secondary | ICD-10-CM

## 2023-01-02 DIAGNOSIS — R6520 Severe sepsis without septic shock: Secondary | ICD-10-CM

## 2023-01-02 DIAGNOSIS — E876 Hypokalemia: Secondary | ICD-10-CM | POA: Insufficient documentation

## 2023-01-02 DIAGNOSIS — I10 Essential (primary) hypertension: Secondary | ICD-10-CM | POA: Diagnosis not present

## 2023-01-02 DIAGNOSIS — N10 Acute tubulo-interstitial nephritis: Secondary | ICD-10-CM | POA: Diagnosis not present

## 2023-01-02 DIAGNOSIS — N390 Urinary tract infection, site not specified: Secondary | ICD-10-CM

## 2023-01-02 DIAGNOSIS — F99 Mental disorder, not otherwise specified: Secondary | ICD-10-CM

## 2023-01-02 DIAGNOSIS — G9341 Metabolic encephalopathy: Secondary | ICD-10-CM

## 2023-01-02 LAB — CBC WITH DIFFERENTIAL/PLATELET
Abs Immature Granulocytes: 0.08 10*3/uL — ABNORMAL HIGH (ref 0.00–0.07)
Basophils Absolute: 0 10*3/uL (ref 0.0–0.1)
Basophils Relative: 0 %
Eosinophils Absolute: 0 10*3/uL (ref 0.0–0.5)
Eosinophils Relative: 0 %
HCT: 46.5 % — ABNORMAL HIGH (ref 36.0–46.0)
Hemoglobin: 14.2 g/dL (ref 12.0–15.0)
Immature Granulocytes: 1 %
Lymphocytes Relative: 4 %
Lymphs Abs: 0.5 10*3/uL — ABNORMAL LOW (ref 0.7–4.0)
MCH: 29.4 pg (ref 26.0–34.0)
MCHC: 30.5 g/dL (ref 30.0–36.0)
MCV: 96.3 fL (ref 80.0–100.0)
Monocytes Absolute: 0.8 10*3/uL (ref 0.1–1.0)
Monocytes Relative: 6 %
Neutro Abs: 11.4 10*3/uL — ABNORMAL HIGH (ref 1.7–7.7)
Neutrophils Relative %: 89 %
Platelets: 187 10*3/uL (ref 150–400)
RBC: 4.83 MIL/uL (ref 3.87–5.11)
RDW: 13.5 % (ref 11.5–15.5)
WBC: 12.8 10*3/uL — ABNORMAL HIGH (ref 4.0–10.5)
nRBC: 0 % (ref 0.0–0.2)

## 2023-01-02 LAB — ECHOCARDIOGRAM COMPLETE
Area-P 1/2: 6.27 cm2
Height: 69 in
S' Lateral: 3.9 cm
Single Plane A4C EF: 69 %
Weight: 3015.89 oz

## 2023-01-02 LAB — GLUCOSE, CAPILLARY
Glucose-Capillary: 132 mg/dL — ABNORMAL HIGH (ref 70–99)
Glucose-Capillary: 135 mg/dL — ABNORMAL HIGH (ref 70–99)
Glucose-Capillary: 154 mg/dL — ABNORMAL HIGH (ref 70–99)
Glucose-Capillary: 181 mg/dL — ABNORMAL HIGH (ref 70–99)
Glucose-Capillary: 80 mg/dL (ref 70–99)

## 2023-01-02 LAB — COMPREHENSIVE METABOLIC PANEL
ALT: 14 U/L (ref 0–44)
AST: 16 U/L (ref 15–41)
Albumin: 3.6 g/dL (ref 3.5–5.0)
Alkaline Phosphatase: 98 U/L (ref 38–126)
Anion gap: 11 (ref 5–15)
BUN: 20 mg/dL (ref 6–20)
CO2: 19 mmol/L — ABNORMAL LOW (ref 22–32)
Calcium: 9.3 mg/dL (ref 8.9–10.3)
Chloride: 106 mmol/L (ref 98–111)
Creatinine, Ser: 0.86 mg/dL (ref 0.44–1.00)
GFR, Estimated: >60 mL/min (ref >60)
Glucose, Bld: 103 mg/dL — ABNORMAL HIGH (ref 70–99)
Potassium: 3.6 mmol/L (ref 3.5–5.1)
Sodium: 136 mmol/L (ref 135–145)
Total Bilirubin: 0.9 mg/dL (ref 0.3–1.2)
Total Protein: 8.7 g/dL — ABNORMAL HIGH (ref 6.5–8.1)

## 2023-01-02 LAB — CULTURE, BLOOD (ROUTINE X 2)

## 2023-01-02 LAB — HEMOGLOBIN A1C
Hgb A1c MFr Bld: 6.2 % — ABNORMAL HIGH (ref 4.8–5.6)
Mean Plasma Glucose: 131.24 mg/dL

## 2023-01-02 LAB — MAGNESIUM: Magnesium: 2.2 mg/dL (ref 1.7–2.4)

## 2023-01-02 MED ORDER — BENZTROPINE MESYLATE 1 MG PO TABS
0.5000 mg | ORAL_TABLET | Freq: Two times a day (BID) | ORAL | Status: DC
  Administered 2023-01-02 – 2023-01-04 (×4): 0.5 mg via ORAL
  Filled 2023-01-02 (×4): qty 1

## 2023-01-02 MED ORDER — INSULIN ASPART 100 UNIT/ML IJ SOLN
0.0000 [IU] | Freq: Three times a day (TID) | INTRAMUSCULAR | Status: DC
  Administered 2023-01-02 (×2): 2 [IU] via SUBCUTANEOUS
  Administered 2023-01-02: 3 [IU] via SUBCUTANEOUS
  Administered 2023-01-03: 2 [IU] via SUBCUTANEOUS
  Administered 2023-01-03: 3 [IU] via SUBCUTANEOUS
  Administered 2023-01-04: 8 [IU] via SUBCUTANEOUS
  Administered 2023-01-04: 3 [IU] via SUBCUTANEOUS

## 2023-01-02 MED ORDER — UMECLIDINIUM BROMIDE 62.5 MCG/ACT IN AEPB
1.0000 | INHALATION_SPRAY | Freq: Every day | RESPIRATORY_TRACT | Status: DC
  Administered 2023-01-03 – 2023-01-04 (×2): 1 via RESPIRATORY_TRACT
  Filled 2023-01-02: qty 7

## 2023-01-02 MED ORDER — GABAPENTIN 300 MG PO CAPS
400.0000 mg | ORAL_CAPSULE | Freq: Four times a day (QID) | ORAL | Status: DC
  Administered 2023-01-02 – 2023-01-04 (×9): 400 mg via ORAL
  Filled 2023-01-02 (×9): qty 1

## 2023-01-02 MED ORDER — PANTOPRAZOLE SODIUM 40 MG PO TBEC
40.0000 mg | DELAYED_RELEASE_TABLET | Freq: Every day | ORAL | Status: DC
  Administered 2023-01-03 – 2023-01-04 (×2): 40 mg via ORAL
  Filled 2023-01-02 (×2): qty 1

## 2023-01-02 MED ORDER — POLYVINYL ALCOHOL 1.4 % OP SOLN: 2.0000 [drp] | Freq: Two times a day (BID) | OPHTHALMIC | Status: DC | PRN

## 2023-01-02 MED ORDER — NICOTINE POLACRILEX 2 MG MT GUM: 2.0000 mg | CHEWING_GUM | OROMUCOSAL | Status: DC | PRN

## 2023-01-02 MED ORDER — TIOTROPIUM BROMIDE MONOHYDRATE 18 MCG IN CAPS: 18.0000 ug | ORAL_CAPSULE | Freq: Every day | RESPIRATORY_TRACT | Status: DC

## 2023-01-02 MED ORDER — INSULIN ASPART 100 UNIT/ML IJ SOLN: 0.0000 [IU] | Freq: Every day | INTRAMUSCULAR | Status: DC

## 2023-01-02 MED ORDER — ASPIRIN 81 MG PO TBEC
81.0000 mg | DELAYED_RELEASE_TABLET | Freq: Every day | ORAL | Status: DC
  Administered 2023-01-03 – 2023-01-04 (×2): 81 mg via ORAL
  Filled 2023-01-02 (×2): qty 1

## 2023-01-02 MED ORDER — DIVALPROEX SODIUM ER 500 MG PO TB24
1000.0000 mg | ORAL_TABLET | Freq: Every day | ORAL | Status: DC
  Administered 2023-01-02 – 2023-01-03 (×2): 1000 mg via ORAL
  Filled 2023-01-02 (×2): qty 2

## 2023-01-02 MED ORDER — COLCHICINE 0.6 MG PO TABS
0.6000 mg | ORAL_TABLET | Freq: Every day | ORAL | Status: DC
  Administered 2023-01-03 – 2023-01-04 (×2): 0.6 mg via ORAL
  Filled 2023-01-02 (×2): qty 1

## 2023-01-02 MED ORDER — INSULIN DETEMIR 100 UNIT/ML ~~LOC~~ SOLN
15.0000 [IU] | Freq: Every day | SUBCUTANEOUS | Status: DC
  Administered 2023-01-02 – 2023-01-03 (×2): 15 [IU] via SUBCUTANEOUS
  Filled 2023-01-02 (×3): qty 0.15

## 2023-01-02 MED ORDER — POLYETHYLENE GLYCOL 3350 17 G PO PACK
17.0000 g | PACK | Freq: Every day | ORAL | Status: DC
  Administered 2023-01-04: 17 g via ORAL
  Filled 2023-01-02: qty 1

## 2023-01-02 MED ORDER — FUROSEMIDE 10 MG/ML IJ SOLN
40.0000 mg | Freq: Once | INTRAMUSCULAR | Status: AC
  Administered 2023-01-02: 40 mg via INTRAVENOUS
  Filled 2023-01-02: qty 4

## 2023-01-02 NOTE — TOC Progression Note (Signed)
Transition of Care [facility]) - Progression Note    Patient Details  Name: Regina Watson MRN: [id number] Date of Birth: [birth year]  [facility] CM/SW Contact  Leitha Bleak, RN Phone Number: 01/02/2023, 10:34 AM  Clinical Narrative:   From [facility]. TOC following.     Expected Discharge Plan: Assisted Living Barriers to Discharge: Continued Medical Work up  Expected Discharge Plan and Services      Living arrangements for the past 2 months: Assisted Living Facility                   Social Determinants of Health (SDOH) Interventions SDOH Screenings   Housing: Patient Unable To Answer (01/01/2023)  Tobacco Use: High Risk (01/01/2023)    Readmission Risk Interventions    10/16/2020    1:07 PM  Readmission Risk Prevention Plan  Transportation Screening Complete  Home Care Screening Complete  Medication Review (RN CM) Complete

## 2023-01-02 NOTE — Assessment & Plan Note (Signed)
-   40 mEq replaced in the ED - Trend in the a.m.

## 2023-01-02 NOTE — Assessment & Plan Note (Addendum)
-   CT abdomen shows right-sided pyelonephritis - Urine culture sent - Reports urinary frequency and hematuria - Rocephin started - Continue Rocephin - Blood cultures pending - Continue to monitor

## 2023-01-02 NOTE — Assessment & Plan Note (Signed)
-   Patient is a leukocytosis of 14.0, she had a temperature of 103.1, heart rate up to 130, respiratory rate 26 - UA indicative of UTI - Urine culture pending - Blood cultures pending - CT abdomen pelvis showed right-sided pyelonephritis - 2 g of Rocephin given in the ED - Continue Rocephin - Lactic acid normal x 2 - Continue to monitor

## 2023-01-02 NOTE — Progress Notes (Signed)
  Echocardiogram 2D Echocardiogram has been performed.  Maren Reamer 01/02/2023, 11:45 AM

## 2023-01-02 NOTE — Assessment & Plan Note (Signed)
-   Glucose well-controlled at presentation - Reduce dose of insulin for tomorrow night - Sliding scale coverage - CBGs

## 2023-01-02 NOTE — Progress Notes (Signed)
ASSUMPTION OF CARE NOTE   01/02/2023 5:31 PM  Regina Watson was seen and examined.  The H&P by the admitting provider, orders, imaging was reviewed.  Please see new orders.  Will continue to follow.   Vitals:   01/02/23 1026 01/02/23 1456  BP: (!) 146/74 130/69  Pulse: (!) 108 (!) 104  Resp: 20 16  Temp: 98.5 F (36.9 C) 99.9 F (37.7 C)  SpO2: 97% 97%    Results for orders placed or performed during the hospital encounter of 01/01/23  Resp panel by RT-PCR (RSV, Flu A&B, Covid) Anterior Nasal Swab   Specimen: Anterior Nasal Swab  Result Value Ref Range   SARS Coronavirus 2 by RT PCR NEGATIVE NEGATIVE   Influenza A by PCR NEGATIVE NEGATIVE   Influenza B by PCR NEGATIVE NEGATIVE   Resp Syncytial Virus by PCR NEGATIVE NEGATIVE  Blood Culture (routine x 2)   Specimen: BLOOD RIGHT ARM  Result Value Ref Range   Specimen Description BLOOD RIGHT ARM    Special Requests      BOTTLES DRAWN AEROBIC AND ANAEROBIC Blood Culture adequate volume   Culture      NO GROWTH < 12 HOURS Performed at [facility], [address] [place], [place] [address]    Report Status PENDING   Blood Culture (routine x 2)   Specimen: Left Antecubital; Blood  Result Value Ref Range   Specimen Description LEFT ANTECUBITAL    Special Requests      BOTTLES DRAWN AEROBIC AND ANAEROBIC Blood Culture adequate volume   Culture      NO GROWTH < 12 HOURS Performed at [facility], [address], [place], [place] [address]    Report Status PENDING   Urinalysis, w/ Reflex to Culture (Infection Suspected) -Urine, Unspecified Source  Result Value Ref Range   Specimen Source URINE, UNSPE    Color, Urine YELLOW YELLOW   APPearance HAZY (A) CLEAR   Specific Gravity, Urine 1.028 1.005 - 1.030   pH 5.0 5.0 - 8.0   Glucose, UA >=500 (A) NEGATIVE mg/dL   Hgb urine dipstick SMALL (A) NEGATIVE   Bilirubin Urine NEGATIVE NEGATIVE   Ketones, ur 5 (A) NEGATIVE mg/dL   Protein, ur 30 (A) NEGATIVE mg/dL   Nitrite  NEGATIVE NEGATIVE   Leukocytes,Ua NEGATIVE NEGATIVE   RBC / HPF 11-20 0 - 5 RBC/hpf   WBC, UA 6-10 0 - 5 WBC/hpf   Bacteria, UA RARE (A) NONE SEEN   Squamous Epithelial / HPF 0-5 0 - 5 /HPF  Lactic acid, plasma  Result Value Ref Range   Lactic Acid, Venous 1.4 0.5 - 1.9 mmol/L  Lactic acid, plasma  Result Value Ref Range   Lactic Acid, Venous 1.6 0.5 - 1.9 mmol/L  Comprehensive metabolic panel  Result Value Ref Range   Sodium 137 135 - 145 mmol/L   Potassium 3.2 (L) 3.5 - 5.1 mmol/L   Chloride 108 98 - 111 mmol/L   CO2 21 (L) 22 - 32 mmol/L   Glucose, Bld 137 (H) 70 - 99 mg/dL   BUN 24 (H) 6 - 20 mg/dL   Creatinine, Ser 0.98 0.44 - 1.00 mg/dL   Calcium 8.8 (L) 8.9 - 10.3 mg/dL   Total Protein 7.5 6.5 - 8.1 g/dL   Albumin 3.1 (L) 3.5 - 5.0 g/dL   AST 12 (L) 15 - 41 U/L   ALT 14 0 - 44 U/L   Alkaline Phosphatase 81 38 - 126 U/L   Total Bilirubin 0.7  0.3 - 1.2 mg/dL   GFR, Estimated >16 >10 mL/min   Anion gap 8 5 - 15  CBC with Differential  Result Value Ref Range   WBC 14.0 (H) 4.0 - 10.5 K/uL   RBC 4.23 3.87 - 5.11 MIL/uL   Hemoglobin 12.4 12.0 - 15.0 g/dL   HCT 96.0 45.4 - 09.8 %   MCV 92.0 80.0 - 100.0 fL   MCH 29.3 26.0 - 34.0 pg   MCHC 31.9 30.0 - 36.0 g/dL   RDW 11.9 14.7 - 82.9 %   Platelets 190 150 - 400 K/uL   nRBC 0.0 0.0 - 0.2 %   Neutrophils Relative % 88 %   Neutro Abs 12.3 (H) 1.7 - 7.7 K/uL   Lymphocytes Relative 3 %   Lymphs Abs 0.4 (L) 0.7 - 4.0 K/uL   Monocytes Relative 9 %   Monocytes Absolute 1.2 (H) 0.1 - 1.0 K/uL   Eosinophils Relative 0 %   Eosinophils Absolute 0.0 0.0 - 0.5 K/uL   Basophils Relative 0 %   Basophils Absolute 0.0 0.0 - 0.1 K/uL   Immature Granulocytes 0 %   Abs Immature Granulocytes 0.06 0.00 - 0.07 K/uL  Protime-INR  Result Value Ref Range   Prothrombin Time 15.6 (H) 11.4 - 15.2 seconds   INR 1.2 0.8 - 1.2  APTT  Result Value Ref Range   aPTT 31 24 - 36 seconds  Hemoglobin A1c  Result Value Ref Range   Hgb A1c MFr  Bld 6.2 (H) 4.8 - 5.6 %   Mean Plasma Glucose 131.24 mg/dL  Comprehensive metabolic panel  Result Value Ref Range   Sodium 136 135 - 145 mmol/L   Potassium 3.6 3.5 - 5.1 mmol/L   Chloride 106 98 - 111 mmol/L   CO2 19 (L) 22 - 32 mmol/L   Glucose, Bld 103 (H) 70 - 99 mg/dL   BUN 20 6 - 20 mg/dL   Creatinine, Ser 5.62 0.44 - 1.00 mg/dL   Calcium 9.3 8.9 - 13.0 mg/dL   Total Protein 8.7 (H) 6.5 - 8.1 g/dL   Albumin 3.6 3.5 - 5.0 g/dL   AST 16 15 - 41 U/L   ALT 14 0 - 44 U/L   Alkaline Phosphatase 98 38 - 126 U/L   Total Bilirubin 0.9 0.3 - 1.2 mg/dL   GFR, Estimated >86 >57 mL/min   Anion gap 11 5 - 15  Magnesium  Result Value Ref Range   Magnesium 2.2 1.7 - 2.4 mg/dL  CBC with Differential/Platelet  Result Value Ref Range   WBC 12.8 (H) 4.0 - 10.5 K/uL   RBC 4.83 3.87 - 5.11 MIL/uL   Hemoglobin 14.2 12.0 - 15.0 g/dL   HCT 84.6 (H) 96.2 - 95.2 %   MCV 96.3 80.0 - 100.0 fL   MCH 29.4 26.0 - 34.0 pg   MCHC 30.5 30.0 - 36.0 g/dL   RDW 84.1 32.4 - 40.1 %   Platelets 187 150 - 400 K/uL   nRBC 0.0 0.0 - 0.2 %   Neutrophils Relative % 89 %   Neutro Abs 11.4 (H) 1.7 - 7.7 K/uL   Lymphocytes Relative 4 %   Lymphs Abs 0.5 (L) 0.7 - 4.0 K/uL   Monocytes Relative 6 %   Monocytes Absolute 0.8 0.1 - 1.0 K/uL   Eosinophils Relative 0 %   Eosinophils Absolute 0.0 0.0 - 0.5 K/uL   Basophils Relative 0 %   Basophils Absolute 0.0 0.0 - 0.1 K/uL  Immature Granulocytes 1 %   Abs Immature Granulocytes 0.08 (H) 0.00 - 0.07 K/uL  Glucose, capillary  Result Value Ref Range   Glucose-Capillary 80 70 - 99 mg/dL   Comment 1 Notify RN    Comment 2 Document in Chart   Glucose, capillary  Result Value Ref Range   Glucose-Capillary 135 (H) 70 - 99 mg/dL  Glucose, capillary  Result Value Ref Range   Glucose-Capillary 132 (H) 70 - 99 mg/dL  Glucose, capillary  Result Value Ref Range   Glucose-Capillary 154 (H) 70 - 99 mg/dL  POC urine preg, ED  Result Value Ref Range   Preg Test, Ur  Negative Negative  ECHOCARDIOGRAM COMPLETE  Result Value Ref Range   Weight 3,015.89 oz   Height 69 in   BP 146/74 mmHg   Single Plane A4C EF 69.0 %   Area-P 1/2 6.27 cm2   S' Lateral 3.90 cm   Est EF 45 - 50%    Maryln Manuel, MD [facility]   01/01/2023  6:43 PM How to contact the [facility] Attending or Consulting provider 7A - 7P or covering provider during after hours 7P -7A, for this patient?  Check the care team in [facility] and look for a) attending/consulting [facility] provider listed and b) the [facility] team listed Log into [email or web address] and use [place]'s universal password to access. If you do not have the password, please contact the hospital operator. Locate the [facility] provider you are looking for under [facility] and page to a number that you can be directly reached. If you still have difficulty reaching the provider, please page the [facility] (Director on Call) for the Hospitalists listed on amion for assistance.

## 2023-01-02 NOTE — Assessment & Plan Note (Signed)
-   Continue Atarax, Seroquel, Risperdal

## 2023-01-02 NOTE — H&P (Signed)
History and Physical    Patient: Regina Watson VWU:[id number] DOB: [birth year] DOA: 01/01/2023 DOS: the patient was seen and examined on 01/02/2023 PCP: Regina Brod, MD  Patient coming from: Group home  Chief Complaint:  Chief Complaint  Patient presents with   Fever   HPI: Regina Watson is a 57 y.o. female with medical history significant of anemia, breast cancer, psychiatric disorder, hypertension, neuropathy presents the ED with a chief complaint of fever.  Patient's baseline mentation is unclear.  At this time she is slightly confused.  She reports she does not remember what happened that brought her into the hospital.  When asked her if she had a fever she said oh yes she did.  Then she reported that she had urinary frequency.  She admits to hematuria as well.  Patient reports that she had some nausea and vomiting that was nonbloody.  She does not remember which day she had the nausea and vomiting if it was 2 days ago or if it was the day of presentation.  She reports that she has not been on any recent antibiotics.  She was recently seen in our ER for abdominal pain.  At that time she was complaining of right lower abdominal pain.  Her pain had been attributed to eating too much, and had resolved after vomiting yesterday.  She had no urinary symptoms yesterday.  Patient reports that she has a lot of fluid retention at baseline.  She reports that she drinks more fluids than she eats as a way to avoid gaining weight.  She has pitting edema all the way up to the middle of her abdomen, abdomen is taut.  Patient also reports that her insulin regimen was recently changed with reduced insulin from 25 to 20 units at night.  Patient has no other complaints or concerns at this time  Patient reports she smokes, but would like Nicorette instead of nicotine patch.  She reports that she does not drink, does not use illicit drugs.  She is vaccinated for COVID.  Patient would like to be full  code. Review of Systems: As mentioned in the history of present illness. All other systems reviewed and are negative. Past Medical History:  Diagnosis Date   Anemia    Breast cancer (HCC)    Diabetes mellitus without complication (HCC)    Hypertension    MDD (major depressive disorder)    Neuropathy 2010   Schizophrenia (HCC)    Past Surgical History:  Procedure Laterality Date   BIOPSY  04/12/2022   Procedure: BIOPSY;  Surgeon: Dolores Frame, MD;  [facility];  Service: Gastroenterology;;   COLONOSCOPY WITH PROPOFOL N/A 04/12/2022   Procedure: COLONOSCOPY WITH PROPOFOL;  Surgeon: Dolores Frame, MD;  [facility]: [facility];  Service: Gastroenterology;  Laterality: N/A;  [facility]30 [facility]   ESOPHAGOGASTRODUODENOSCOPY (EGD) WITH PROPOFOL N/A 04/12/2022   Procedure: ESOPHAGOGASTRODUODENOSCOPY (EGD) WITH PROPOFOL;  Surgeon: Dolores Frame, MD;  [facility];  Service: Gastroenterology;  Laterality: N/A;   INCISION AND DRAINAGE ABSCESS Left 05/08/2022   Procedure: INCISION AND DRAINAGE ABSCESS of left breast abscess;  Surgeon: Lewie Chamber, DO;  Location: [facility];  Service: General;  Laterality: Left;   TOE AMPUTATION     Social History:  reports that she has been smoking cigarettes. She has a 20.00 pack-year smoking history. She has never used smokeless tobacco. She reports that she does not drink alcohol and does not  use drugs.  Allergies  Allergen Reactions   Ziprasidone Hcl     Other reaction(s): Other Psychotic reaction Psychotic reaction   Penicillinase Rash and Swelling   Sulfa Antibiotics Other (See Comments)   Sulfamethoxazole-Trimethoprim Hives and Other (See Comments)    Other Reaction(s): Dermatitis  Bullous lesions  unknown   Penicillins     Has patient had a PCN reaction causing immediate rash, facial/tongue/throat swelling, SOB or lightheadedness with hypotension:  UNKNOWN Has patient had a PCN reaction causing severe rash involving mucus membranes or skin necrosis: UNKNOWN Has patient had a PCN reaction that required hospitalization: UNKNOWN Has patient had a PCN reaction occurring within the last 10 years: UNKNOWN If all of the above answers are "NO", then may proceed with Cephalosporin use.    Latex Itching    Family History  Problem Relation Age of Onset   Hypertension Mother    Cancer Mother    Diabetes Father     Prior to Admission medications   Medication Sig Start Date End Date Taking? Authorizing Provider  acetaminophen (TYLENOL) 325 MG tablet Take 650 mg by mouth every 6 (six) hours as needed for mild pain or moderate pain.    [provider]  acetaZOLAMIDE ER (DIAMOX) 500 MG capsule Take 500 mg by mouth 2 (two) times daily.    [provider]  albuterol (VENTOLIN HFA) 108 (90 Base) MCG/ACT inhaler Inhale 2 puffs into the lungs every 4 (four) hours as needed for wheezing or shortness of breath.    [provider]  amLODipine (NORVASC) 5 MG tablet Take 1 tablet (5 mg total) by mouth daily. 01/21/21   Catarina Hartshorn, MD  aspirin EC 81 MG tablet Take 81 mg by mouth every morning.    [provider]  atorvastatin (LIPITOR) 10 MG tablet Take 10 mg by mouth every evening.     [provider]  benztropine (COGENTIN) 0.5 MG tablet Take 0.5 mg by mouth 2 (two) times daily.    [provider]  colchicine 0.6 MG tablet Take 0.6 mg by mouth daily. 08/03/21   [provider]  divalproex (DEPAKOTE ER) 500 MG 24 hr tablet Take 1,000 mg by mouth at bedtime.    [provider]  doxycycline (VIBRAMYCIN) 100 MG capsule Take 1 capsule (100 mg total) by mouth 2 (two) times daily. 05/18/22   Pollina, Canary Brim, MD  FARXIGA 10 MG TABS tablet Take 10 mg by mouth daily. 06/30/21   [provider]  furosemide (LASIX) 40 MG tablet Take 1 tablet (40 mg total) by mouth daily as needed for  fluid or edema. 10/17/20   Rolly Salter, MD  gabapentin (NEURONTIN) 400 MG capsule Take 1 capsule (400 mg total) by mouth 3 (three) times daily. Patient taking differently: Take 400 mg by mouth 4 (four) times daily. 10/17/20   Rolly Salter, MD  hydrOXYzine (ATARAX) 25 MG tablet Take 25 mg by mouth 3 (three) times daily.    [provider]  insulin aspart (NOVOLOG FLEXPEN) 100 UNIT/ML FlexPen Inject 11-15 Units into the skin 3 (three) times daily with meals. 150-200=11units,201-250=12units, 251-300=13units, 301-350=14units, 351-400=15units    [provider]  insulin glargine (LANTUS) 100 UNIT/ML injection Inject 0.25 mLs (25 Units total) into the skin daily. 05/08/22   Johnson, Clanford L, MD  levocetirizine (XYZAL) 5 MG tablet Take 5 mg by mouth daily.    [provider]  metFORMIN (GLUCOPHAGE) 1000 MG tablet Take 1,000 mg by mouth 2 (two)  times daily with a meal.    [provider]  Methylcellulose, Laxative, (GOODSENSE FIBER PO) Take 2 Scoops by mouth 3 (three) times daily. 2 tspn into 4-8 ounces of beverage    [provider]  omeprazole (PRILOSEC) 40 MG capsule Take 1 capsule (40 mg total) by mouth daily. 04/12/22   Dolores Frame, MD  ondansetron (ZOFRAN) 4 MG tablet Take 1 tablet (4 mg total) by mouth every 8 (eight) hours as needed for nausea or vomiting. 05/08/22   Johnson, Clanford L, MD  polyethylene glycol (MIRALAX / GLYCOLAX) 17 g packet Take 17 g by mouth daily.    [provider]  QUEtiapine (SEROQUEL XR) 400 MG 24 hr tablet Take 800 mg by mouth at bedtime.    [provider]  risperiDONE (RISPERDAL M-TABS) 3 MG disintegrating tablet Take 3 mg by mouth 2 (two) times daily.    [provider]  Semaglutide,0.25 or 0.5MG /DOS, (OZEMPIC, 0.25 OR 0.5 MG/DOSE,) 2 MG/1.5ML SOPN 0.25 mg by Subconjunctival route every Friday.    [provider]  sennosides-docusate sodium (SENOKOT-S) 8.6-50 MG tablet Take  1 tablet by mouth in the morning and at bedtime.    [provider]  sucralfate (CARAFATE) 1 g tablet Take 1 tablet (1 g total) by mouth 2 (two) times daily for 28 days. 04/12/22 05/10/22  Dolores Frame, MD  tiotropium (SPIRIVA) 18 MCG inhalation capsule Place 18 mcg into inhaler and inhale daily.    [provider]  traMADol (ULTRAM) 50 MG tablet Take 50 mg by mouth every 6 (six) hours as needed for severe pain.    [provider]  Vitamin D, Ergocalciferol, (DRISDOL) 1.25 MG (50000 UNIT) CAPS capsule Take 50,000 Units by mouth every Saturday.    [provider]  Wheat Dextrin (GOODSENSE BEST FIBER) POWD STIR (2) TEASPOONFUL INTO 4-8 OUNCES OF BEVERAGE OR SOFT FOOD THREE TIMES DAILY WITH MEALS. **STIR WELL UNTIL DISSOLVED.** 04/19/22   Raquel James, NP    Physical Exam: Vitals:   01/01/23 2145 01/01/23 2230 01/01/23 2321 01/02/23 0004  BP:  113/61 115/71 127/75  Pulse: 100 95 97 (!) 105  Resp: 10 14 20 20   Temp:   98.1 F (36.7 C) 99 F (37.2 C)  TempSrc:   Oral Oral  SpO2: 96% 96% 99% 100%  Weight:   85.5 kg   Height:       1.  General: Patient lying supine in bed,  no acute distress   2. Psychiatric: Alert and oriented x 3, mood and behavior normal for situation, pleasant and cooperative with exam   3. Neurologic: Speech and language are normal, face is symmetric, moves all 4 extremities voluntarily, at baseline without acute deficits on limited exam   4. HEENMT:  Head is atraumatic, normocephalic, pupils reactive to light, neck is supple, trachea is midline, mucous membranes are moist   5. Respiratory : Lungs are clear to auscultation bilaterally without wheezing, rhonchi, rales, no cyanosis, no increase in work of breathing or accessory muscle use   6. Cardiovascular : Heart rate normal, rhythm is regular, no murmurs, rubs or gallops, significant peripheral edema present, peripheral pulses palpated   7. Gastrointestinal:   Abdomen is taut, nondistended, nontender to palpation bowel sounds active, no masses or organomegaly palpated   8. Skin:  Skin is warm, dry and intact without rashes, acute lesions, or ulcers on limited exam   9.Musculoskeletal:  No acute deformities or trauma, no asymmetry in tone, significant  peripheral edema, peripheral pulses palpated, no tenderness to palpation in the extremities  Data Reviewed: In the ED Temp 98.8-103.1, heart rate 110-130, respiratory rate 16-26, blood pressure 106/62-135/73, satting 96-97% Leukocytosis at 14.0, hemoglobin 12.4 Chemistry unremarkable aside from a hypokalemia at 3.2 UA is indicative of UTI Blood culture pending CT abdomen pelvis shows right-sided pyelonephritis Patient was given a 2 L bolus in the ED, Rocephin, started on LR at 150 MLS per hour, blood cultures and urine cultures were obtained Admission requested for sepsis secondary to pyelonephritis  Assessment and Plan: * Sepsis (HCC) - Patient is a leukocytosis of 14.0, she had a temperature of 103.1, heart rate up to 130, respiratory rate 26 - UA indicative of UTI - Urine culture pending - Blood cultures pending - CT abdomen pelvis showed right-sided pyelonephritis - 2 g of Rocephin given in the ED - Continue Rocephin - Lactic acid normal x 2 - Continue to monitor  Hypokalemia - 40 mEq replaced in the ED - Trend in the a.m.  Peripheral edema - Patient has peripheral edema all the way to the middle of her abdomen - Her abdomen is tight - No documented history of CHF - She does take a diuretic at baseline - 40 mg IV Lasix ordered - Monitor intake and output - Echo in the a.m. - Currently maintaining oxygen sats on room air - Continue to monitor  Acute pyelonephritis - CT abdomen shows right-sided pyelonephritis - Urine culture sent - Reports urinary frequency and hematuria - Rocephin started - Continue Rocephin - Blood cultures pending - Continue to  monitor  Psychiatric disorder - Continue Atarax, Seroquel, Risperdal  Hyperlipidemia - Continue statin  UTI (urinary tract infection) - UA indicative of UTI - Urinary frequency and hematuria - Urine culture pending - Pyelonephritis on CT - Continue Rocephin  Type 2 diabetes mellitus without complication (HCC) - Glucose well-controlled at presentation - Reduce dose of insulin for tomorrow night - Sliding scale coverage - CBGs  Tobacco use - Current smoker - Counseled on importance of cessation - Declines nicotine patch but request nicotine gum  Essential hypertension - Continue Norvasc      Advance Care Planning:   Code Status: Full Code  Consults: None at this time  Family Communication: No family at bedside  Severity of Illness: The appropriate patient status for this patient is INPATIENT. Inpatient status is judged to be reasonable and necessary in order to provide the required intensity of service to ensure the patient's safety. The patient's presenting symptoms, physical exam findings, and initial radiographic and laboratory data in the context of their chronic comorbidities is felt to place them at high risk for further clinical deterioration. Furthermore, it is not anticipated that the patient will be medically stable for discharge from the hospital within 2 midnights of admission.   * I certify that at the point of admission it is my clinical judgment that the patient will require inpatient hospital care spanning beyond 2 midnights from the point of admission due to high intensity of service, high risk for further deterioration and high frequency of surveillance required.*  Author: Lilyan Gilford, DO 01/02/2023 3:55 AM  For on call review [email or web address].

## 2023-01-02 NOTE — Assessment & Plan Note (Signed)
-   UA indicative of UTI - Urinary frequency and hematuria - Urine culture pending - Pyelonephritis on CT - Continue Rocephin

## 2023-01-02 NOTE — Assessment & Plan Note (Signed)
Continue Norvasc

## 2023-01-02 NOTE — Assessment & Plan Note (Signed)
Continue statin. 

## 2023-01-02 NOTE — Assessment & Plan Note (Signed)
-   Current smoker - Counseled on importance of cessation - Declines nicotine patch but request nicotine gum

## 2023-01-02 NOTE — Assessment & Plan Note (Signed)
-   Patient has peripheral edema all the way to the middle of her abdomen - Her abdomen is tight - No documented history of CHF - She does take a diuretic at baseline - 40 mg IV Lasix ordered - Monitor intake and output - Echo in the a.m. - Currently maintaining oxygen sats on room air - Continue to monitor

## 2023-01-03 ENCOUNTER — Other Ambulatory Visit (HOSPITAL_COMMUNITY): Payer: Medicaid Other

## 2023-01-03 ENCOUNTER — Inpatient Hospital Stay (HOSPITAL_COMMUNITY): Payer: Medicaid Other

## 2023-01-03 DIAGNOSIS — E876 Hypokalemia: Secondary | ICD-10-CM | POA: Diagnosis not present

## 2023-01-03 DIAGNOSIS — N390 Urinary tract infection, site not specified: Secondary | ICD-10-CM | POA: Diagnosis not present

## 2023-01-03 DIAGNOSIS — A419 Sepsis, unspecified organism: Secondary | ICD-10-CM | POA: Diagnosis not present

## 2023-01-03 DIAGNOSIS — R600 Localized edema: Secondary | ICD-10-CM | POA: Diagnosis not present

## 2023-01-03 LAB — BASIC METABOLIC PANEL
Anion gap: 7 (ref 5–15)
BUN: 20 mg/dL (ref 6–20)
CO2: 20 mmol/L — ABNORMAL LOW (ref 22–32)
Calcium: 8.4 mg/dL — ABNORMAL LOW (ref 8.9–10.3)
Chloride: 110 mmol/L (ref 98–111)
Creatinine, Ser: 0.75 mg/dL (ref 0.44–1.00)
GFR, Estimated: >60 mL/min (ref >60)
Glucose, Bld: 92 mg/dL (ref 70–99)
Potassium: 3.1 mmol/L — ABNORMAL LOW (ref 3.5–5.1)
Sodium: 137 mmol/L (ref 135–145)

## 2023-01-03 LAB — GLUCOSE, CAPILLARY
Glucose-Capillary: 80 mg/dL (ref 70–99)
Glucose-Capillary: 157 mg/dL — ABNORMAL HIGH (ref 70–99)
Glucose-Capillary: 129 mg/dL — ABNORMAL HIGH (ref 70–99)
Glucose-Capillary: 177 mg/dL — ABNORMAL HIGH (ref 70–99)

## 2023-01-03 LAB — URINE CULTURE

## 2023-01-03 MED ORDER — POTASSIUM CHLORIDE CRYS ER 20 MEQ PO TBCR
40.0000 meq | EXTENDED_RELEASE_TABLET | Freq: Once | ORAL | Status: AC
  Administered 2023-01-03: 40 meq via ORAL
  Filled 2023-01-03: qty 2

## 2023-01-03 NOTE — TOC Initial Note (Signed)
Transition of Care [facility]) - Initial/Assessment Note    Patient Details  Name: Regina Watson MRN: [id number] Date of Birth: 1966/06/12  [facility] CM/SW Contact:    Elliot Gault, LCSW Phone Number: 01/03/2023, 11:04 AM  Clinical Narrative:                  Pt admitted from [facility]. Per MD, dc anticipated in 1-2 days. Spoke with Rosanne Sack at [employer] to update. He states that the [facility] manager, Cleopatra Cedar, can be called at dc. Her number is [phone].   Pt has a legal guardian. Will continue to keep guardian updated.   Per MD, pt requesting new [facility] placement. Updated MD that pt is requesting this every time she is here and it has been explained to her that she and her guardian can discuss this but it is not possible for TOC to assist in this matter.  TOC will follow.  Expected Discharge Plan: Assisted Living Barriers to Discharge: Continued Medical Work up   Patient Goals and CMS Choice            Expected Discharge Plan and Services In-house Referral: Clinical Social Work     Living arrangements for the past 2 months: Assisted Living Facility                                      Prior Living Arrangements/Services Living arrangements for the past 2 months: Assisted Living Facility Lives with:: Facility Resident Patient language and need for interpreter reviewed:: Yes Do you feel safe going back to the place where you live?: Yes      Need for Family Participation in Patient Care: No (Comment) Care giver support system in place?: Yes (comment)   Criminal Activity/Legal Involvement Pertinent to Current Situation/Hospitalization: No - Comment as needed  Activities of Daily Living Home Assistive Devices/Equipment: None ADL Screening (condition at time of admission) Patient's cognitive ability adequate to safely complete daily activities?: Yes Is the patient deaf or have difficulty hearing?: No Does the patient have difficulty  seeing, even when wearing glasses/contacts?: No Does the patient have difficulty concentrating, remembering, or making decisions?: No Patient able to express need for assistance with ADLs?: No Does the patient have difficulty dressing or bathing?: No Independently performs ADLs?: Yes (appropriate for developmental age) Does the patient have difficulty walking or climbing stairs?: No Weakness of Legs: None Weakness of Arms/Hands: None  Permission Sought/Granted                  Emotional Assessment       Orientation: : Oriented to Self, Oriented to Place, Oriented to  Time, Oriented to Situation Alcohol / Substance Use: Not Applicable Psych Involvement: No (comment)  Admission diagnosis:  Pyelonephritis [N12] Sepsis (HCC) [A41.9] Patient Active Problem List   Diagnosis Date Noted   Psychiatric disorder 01/02/2023   Acute pyelonephritis 01/02/2023   Peripheral edema 01/02/2023   Hypokalemia 01/02/2023   Sepsis (HCC) 01/01/2023   Callus 07/05/2022   Abscess 05/08/2022   Breast abscess    Dysphagia 03/05/2022   Nausea and vomiting 03/03/2022   Early satiety 03/03/2022   Constipation 03/03/2022   Loss of weight 03/03/2022   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   Acute metabolic encephalopathy 01/17/2021   Uterine mass 01/17/2021   Altered mental status 10/15/2020   Leukocytosis 10/15/2020   AKI (  acute kidney injury) (HCC) 10/15/2020   Hyperglycemia due to diabetes mellitus (HCC) 10/15/2020   Hyperlipidemia 10/15/2020   Diabetic neuropathy (HCC) 10/15/2020   UTI (urinary tract infection) 10/15/2019   Encephalopathy acute 10/14/2019   Schizophrenia (HCC) 10/14/2019   Type 2 diabetes mellitus without complication (HCC) 10/14/2019   Acute encephalopathy 10/14/2019   Palpitations 10/04/2016   Essential hypertension 10/04/2016   Tobacco use 10/04/2016   Tachycardia 10/04/2016   PCP:  Annita Brod, MD Pharmacy:   [facility] [place] - [address] [place] [address] Phone: [phone] Fax: [phone]     Social Determinants of Health (SDOH) Social History: SDOH Screenings   Housing: Patient Unable To Answer (01/01/2023)  Tobacco Use: High Risk (01/01/2023)   SDOH Interventions:     Readmission Risk Interventions    10/16/2020    1:07 PM  Readmission Risk Prevention Plan  Transportation Screening Complete  Home Care Screening Complete  Medication Review (RN CM) Complete

## 2023-01-03 NOTE — Plan of Care (Signed)
Regina Watson and Dr.Johnson notified that pt.would like to speak to them regarding her current living situation at facility. Pt.verbalizes to nursing that facility does not have hand rails in bathroom and that they lack appropriate safety equipment to assist with vision loss. Pt.fears she will fall if she is not placed in safer environment.

## 2023-01-03 NOTE — Progress Notes (Signed)
PROGRESS NOTE   Regina Watson  ZOX:[id number] DOB: [birth year] DOA: 01/01/2023 PCP: Annita Brod, MD   Chief Complaint  Patient presents with   Fever   Level of care: Med-Surg  Brief Admission History:   57 y.o. female with medical history significant of anemia, breast cancer, psychiatric disorder, hypertension, neuropathy presents the ED with a chief complaint of fever.  Patient's baseline mentation is unclear.  At this time she is slightly confused.  She reports she does not remember what happened that brought her into the hospital.  When asked her if she had a fever she said oh yes she did.  Then she reported that she had urinary frequency.  She admits to hematuria as well.  Patient reports that she had some nausea and vomiting that was nonbloody.  She does not remember which day she had the nausea and vomiting if it was 2 days ago or if it was the day of presentation.  She reports that she has not been on any recent antibiotics.  She was recently seen in our ER for abdominal pain.  At that time she was complaining of right lower abdominal pain.  Her pain had been attributed to eating too much, and had resolved after vomiting yesterday.  She had no urinary symptoms yesterday.   Patient reports that she has a lot of fluid retention at baseline.  She reports that she drinks more fluids than she eats as a way to avoid gaining weight.  She has pitting edema all the way up to the middle of her abdomen, abdomen is taut.  Patient also reports that her insulin regimen was recently changed with reduced insulin from 25 to 20 units at night.   Patient has no other complaints or concerns at this time   Patient reports she smokes, but would like Nicorette instead of nicotine patch.  She reports that she does not drink, does not use illicit drugs.  She is vaccinated for COVID.  Patient would like to be full code.   Assessment and Plan:  Sepsis  - Patient presented with leukocytosis of 14.0,  temperature of 103.1, heart rate up to 130, respiratory rate 26 - UA indicative of UTI - Urine culture pending - Blood cultures pending - CT abdomen pelvis showed right-sided pyelonephritis - 2 g of Rocephin given in the ED - Continue high dose ceftriaxone - Lactic acid normal x 2 - sepsis physiology resolved now  Hypokalemia - 40 mEq given 4/14 - recheck in AM with Mg  Peripheral edema - Patient has peripheral edema all the way to the middle of her abdomen - Her abdomen is tight - No documented history of CHF - She does take a diuretic at baseline - 40 mg IV Lasix ordered - Monitor intake and output - Echo done 5/13, possible new reduction in LVEF to 45-50% but suboptimal study. Cardiologist requested limited echo with definity for better interpretation but patient has refused twice and tech canceled procedure. This is unfortunate because there was mention of possible regional wall motion abnormalities but cardiologist wanted study done with contrast to confirm.  - Currently maintaining oxygen sats on room air - Continue to monitor  Acute pyelonephritis - CT abdomen shows right-sided pyelonephritis - Urine culture sent - Reports urinary frequency and hematuria - Continue high dose ceftriaxone - Blood cultures no growth to date - DC dapagliflozin in setting of UTI   Psychiatric disorder - Continue Atarax, Seroquel, Risperdal  Hyperlipidemia - Continue statin  UTI (urinary tract  infection) - UA indicative of UTI - Urinary frequency and hematuria - Urine culture pending - Pyelonephritis on CT - Continue ceftriaxone. DC dapagliflozin.    Type 2 diabetes mellitus without complication - Glucose well-controlled at presentation - Reduce dose of insulin for tomorrow night - Sliding scale coverage - CBGs CBG (last 3)  Recent Labs    01/02/23 2128 01/03/23 0743 01/03/23 1204  GLUCAP 181* 80 157*   Tobacco use - Current smoker - Counseled on importance of  cessation - Declines nicotine patch but request nicotine gum  Essential hypertension - Continue Norvasc  DVT prophylaxis: [place] heparin Code Status: full  Family Communication:  Disposition: Status is: Inpatient    Consultants:   Procedures:   Antimicrobials:    Subjective: Pt says she still feels weak but says she will try to work with PT team.   Objective: Vitals:   01/02/23 1456 01/02/23 2214 01/03/23 0349 01/03/23 0824  BP: 130/69 132/75 119/72   Pulse: (!) 104 93 95   Resp: 16     Temp: 99.9 F (37.7 C) 99.8 F (37.7 C)    TempSrc: Oral     SpO2: 97% 96% 97% 94%  Weight:      Height:        Intake/Output Summary (Last 24 hours) at 01/03/2023 1324 Last data filed at 01/03/2023 1314 Gross per 24 hour  Intake 1040 ml  Output --  Net 1040 ml   Filed Weights   01/01/23 1850 01/01/23 2321  Weight: 87 kg 85.5 kg   Examination:  General exam: Appears calm and comfortable  Respiratory system: Clear to auscultation. Respiratory effort normal. Cardiovascular system: normal S1 & S2 heard. No JVD, murmurs, rubs, gallops or clicks. No pedal edema. Gastrointestinal system: Abdomen is nondistended, soft and nontender. No organomegaly or masses felt. Normal bowel sounds heard. Central nervous system: Alert and oriented. No focal neurological deficits. Extremities: 1+ pretibial edema BLEs. Symmetric 5 x 5 power. Skin: No rashes, lesions or ulcers. Psychiatry: Judgement and insight appear normal. Mood & affect appropriate.   Data Reviewed: I have personally reviewed following labs and imaging studies  CBC: Recent Labs  Lab 12/31/22 0312 01/01/23 1924 01/02/23 0506  WBC 9.6 14.0* 12.8*  NEUTROABS 8.0* 12.3* 11.4*  HGB 14.0 12.4 14.2  HCT 44.1 38.9 46.5*  MCV 94.2 92.0 96.3  PLT PLATELET CLUMPS NOTED ON SMEAR, COUNT APPEARS ADEQUATE 190 187    Basic Metabolic Panel: Recent Labs  Lab 12/31/22 0312 01/01/23 1924 01/02/23 0506 01/03/23 0744  NA 140 137 136 137   K 3.3* 3.2* 3.6 3.1*  CL 109 108 106 110  CO2 22 21* 19* 20*  GLUCOSE 144* 137* 103* 92  BUN 22* 24* 20 20  CREATININE 0.85 0.91 0.86 0.75  CALCIUM 8.9 8.8* 9.3 8.4*  MG  --   --  2.2  --     CBG: Recent Labs  Lab 01/02/23 1102 01/02/23 1606 01/02/23 2128 01/03/23 0743 01/03/23 1204  GLUCAP 132* 154* 181* 80 157*    Recent Results (from the past 240 hour(s))  Resp panel by RT-PCR (RSV, Flu A&B, Covid) Anterior Nasal Swab     Status: None   Collection Time: 01/01/23  7:10 PM   Specimen: Anterior Nasal Swab  Result Value Ref Range Status   SARS Coronavirus 2 by RT PCR NEGATIVE NEGATIVE Final    Comment: (NOTE) SARS-CoV-2 target nucleic acids are NOT DETECTED.  The SARS-CoV-2 RNA is generally detectable in upper respiratory specimens  during the acute phase of infection. The lowest concentration of SARS-CoV-2 viral copies this assay can detect is 138 copies/mL. A negative result does not preclude SARS-Cov-2 infection and should not be used as the sole basis for treatment or other patient management decisions. A negative result may occur with  improper specimen collection/handling, submission of specimen other than nasopharyngeal swab, presence of viral mutation(s) within the areas targeted by this assay, and inadequate number of viral copies(<138 copies/mL). A negative result must be combined with clinical observations, patient history, and epidemiological information. The expected result is Negative.  Fact Sheet for Patients:  BloggerCourse.com  Fact Sheet for Healthcare Providers:  SeriousBroker.it  This test is no t yet approved or cleared by the [place] FDA and  has been authorized for detection and/or diagnosis of SARS-CoV-2 by FDA under an Emergency Use Authorization (EUA). This EUA will remain  in effect (meaning this test can be used) for the duration of the COVID-19 declaration under Section 564(b)(1)  of the Act, 21 U.S.C.section 360bbb-3(b)(1), unless the authorization is terminated  or revoked sooner.       Influenza A by PCR NEGATIVE NEGATIVE Final   Influenza B by PCR NEGATIVE NEGATIVE Final    Comment: (NOTE) The Xpert Xpress SARS-CoV-2/FLU/RSV plus assay is intended as an aid in the diagnosis of influenza from Nasopharyngeal swab specimens and should not be used as a sole basis for treatment. Nasal washings and aspirates are unacceptable for Xpert Xpress SARS-CoV-2/FLU/RSV testing.  Fact Sheet for Patients: BloggerCourse.com  Fact Sheet for Healthcare Providers: SeriousBroker.it  This test is not yet approved or cleared by the [place] FDA and has been authorized for detection and/or diagnosis of SARS-CoV-2 by FDA under an Emergency Use Authorization (EUA). This EUA will remain in effect (meaning this test can be used) for the duration of the COVID-19 declaration under Section 564(b)(1) of the Act, 21 U.S.C. section 360bbb-3(b)(1), unless the authorization is terminated or revoked.     Resp Syncytial Virus by PCR NEGATIVE NEGATIVE Final    Comment: (NOTE) Fact Sheet for Patients: BloggerCourse.com  Fact Sheet for Healthcare Providers: SeriousBroker.it  This test is not yet approved or cleared by the [place] FDA and has been authorized for detection and/or diagnosis of SARS-CoV-2 by FDA under an Emergency Use Authorization (EUA). This EUA will remain in effect (meaning this test can be used) for the duration of the COVID-19 declaration under Section 564(b)(1) of the Act, 21 U.S.C. section 360bbb-3(b)(1), unless the authorization is terminated or revoked.  Performed at [facility], [address], [place], [place] [address]   Blood Culture (routine x 2)     Status: None (Preliminary result)   Collection Time: 01/01/23  7:24 PM   Specimen: BLOOD RIGHT  ARM  Result Value Ref Range Status   Specimen Description BLOOD RIGHT ARM  Final   Special Requests   Final    BOTTLES DRAWN AEROBIC AND ANAEROBIC Blood Culture adequate volume   Culture   Final    NO GROWTH 2 DAYS Performed at [facility], [address], [place], [place] [address]    Report Status PENDING  Incomplete  Blood Culture (routine x 2)     Status: None (Preliminary result)   Collection Time: 01/01/23  7:28 PM   Specimen: Left Antecubital; Blood  Result Value Ref Range Status   Specimen Description LEFT ANTECUBITAL  Final   Special Requests   Final    BOTTLES DRAWN AEROBIC AND ANAEROBIC Blood Culture  adequate volume   Culture   Final    NO GROWTH 2 DAYS Performed at [facility], [address] [place], [place] [address]    Report Status PENDING  Incomplete  Urine Culture     Status: Abnormal (Preliminary result)   Collection Time: 01/01/23  9:00 PM   Specimen: Urine, Clean Catch  Result Value Ref Range Status   Specimen Description   Final    URINE, CLEAN CATCH Performed at [facility], [address] [place], [place] [address]    Special Requests   Final    NONE Performed at [facility], [address], [place], [place] [address]    Culture (A)  Final    >=100,000 COLONIES/mL ENTEROBACTER AEROGENES SUSCEPTIBILITIES TO FOLLOW Performed at [facility], [address] [place], [place] [address]    Report Status PENDING  Incomplete     Radiology Studies: ECHOCARDIOGRAM COMPLETE  Result Date: 01/02/2023    ECHOCARDIOGRAM REPORT   Patient Name:   ZAELEIGH BOK Date of Exam: 01/02/2023 Medical Rec #:  [id number]         Height:       69.0 in Accession #:    [id number]        Weight:       188.5 lb Date of Birth:  06-Dec-1965          BSA:          2.015 m Patient Age:    57 years          BP:           146/74 mmHg Patient Gender: F                 HR:           107 bpm. [facility] Procedure: 2D Echo, Cardiac Doppler and Color Doppler  Indications:    Edema [782.3.ICD-9-CM]  History:        Patient has prior history of Echocardiogram examinations, most                 recent 10/15/2019. Breast cancer, Signs/Symptoms:Altered Mental                 Status, Shortness of Breath and Edema; Risk                 Factors:Hypertension, Current Smoker, Diabetes and Dyslipidemia.  Sonographer:    Aron Baba Referring Phys: [id number] ASIA B ZIERLE-GHOSH  Sonographer Comments: Suboptimal parasternal window and no apical window. Image acquisition challenging due to patient body habitus and Image acquisition challenging due to respiratory motion. IMPRESSIONS  1. Challenging study. Suboptimal parasternal window and no apical window. Poor quality of echo windows.  2. Left ventricular ejection fraction, by estimation, is 45 to 50%. The left ventricle has mildly decreased function. The left ventricle demonstrates regional wall motion abnormalities, lateral wall appears to be hypokinetic. Suboptimal echo images. Recommend Limited Echo with contrast for accurate estimation of LVEF and RWMA. Indeterminate diastolic filling due to E-A fusion.  3. Right ventricular systolic function was not well visualized. The right ventricular size is normal. Tricuspid regurgitation signal is inadequate for assessing PA pressure.  4. Left atrial size was mildly dilated.  5. The mitral valve is grossly normal. No evidence of mitral valve regurgitation. No evidence of mitral stenosis.  6. The aortic valve was not well visualized. Aortic valve regurgitation is not visualized. No aortic stenosis is present.  7. The  inferior vena cava is normal in size with <50% respiratory variability, suggesting right atrial pressure of 8 mmHg. Comparison(s): LVEF decreased from 60-65% in 2021 to 45-50% now although current study is technically challenging and will need to be repeated with Definity for accurate estimation of LVEF and RWMA. FINDINGS  Left Ventricle: Left ventricular ejection fraction, by  estimation, is 45 to 50%. The left ventricle has mildly decreased function. The left ventricle demonstrates regional wall motion abnormalities. The left ventricular internal cavity size was normal in size. There is no left ventricular hypertrophy. Indeterminate diastolic filling due to E-A fusion.  LV Wall Scoring: The entire lateral wall is hypokinetic. Right Ventricle: The right ventricular size is normal. No increase in right ventricular wall thickness. Right ventricular systolic function was not well visualized. Tricuspid regurgitation signal is inadequate for assessing PA pressure. Left Atrium: Left atrial size was mildly dilated. Right Atrium: Right atrial size was normal in size. Pericardium: There is no evidence of pericardial effusion. Mitral Valve: The mitral valve is grossly normal. No evidence of mitral valve regurgitation. No evidence of mitral valve stenosis. Tricuspid Valve: The tricuspid valve is not well visualized. Tricuspid valve regurgitation is not demonstrated. No evidence of tricuspid stenosis. Aortic Valve: The aortic valve was not well visualized. Aortic valve regurgitation is not visualized. No aortic stenosis is present. Pulmonic Valve: The pulmonic valve was not well visualized. Pulmonic valve regurgitation is not visualized. No evidence of pulmonic stenosis. Aorta: The aortic root and ascending aorta are structurally normal, with no evidence of dilitation. Venous: The inferior vena cava is normal in size with less than 50% respiratory variability, suggesting right atrial pressure of 8 mmHg. IAS/Shunts: No atrial level shunt detected by color flow Doppler.  LEFT VENTRICLE PLAX 2D LVIDd:         4.80 cm     Diastology LVIDs:         3.90 cm     LV e' medial:    8.17 cm/s LV PW:         0.80 cm     LV E/e' medial:  7.3 LV IVS:        1.10 cm     LV e' lateral:   6.95 cm/s LVOT diam:     1.90 cm     LV E/e' lateral: 8.6 LV SV:         31 LV SV Index:   16 LVOT Area:     2.84 cm  LV Volumes  (MOD) LV vol d, MOD A4C: 91.6 ml LV vol s, MOD A4C: 28.4 ml LV SV MOD A4C:     91.6 ml RIGHT VENTRICLE RV S prime:     11.40 cm/s TAPSE (M-mode): 1.4 cm LEFT ATRIUM           Index        RIGHT ATRIUM           Index LA diam:      3.70 cm 1.84 cm/m   RA Area:     19.40 cm LA Vol (A4C): 70.0 ml 34.75 ml/m  RA Volume:   60.50 ml  30.03 ml/m  AORTIC VALVE LVOT Vmax:   67.80 cm/s LVOT Vmean:  47.000 cm/s LVOT VTI:    0.110 m  AORTA Ao Root diam: 2.90 cm Ao Asc diam:  2.80 cm MITRAL VALVE MV Area (PHT): 6.27 cm    SHUNTS MV Decel Time: 121 msec    Systemic VTI:  0.11 m MV E velocity:  59.90 cm/s  Systemic Diam: 1.90 cm MV A velocity: 61.40 cm/s MV E/A ratio:  0.98 Vishnu Priya Mallipeddi Electronically signed by Winfield Rast Mallipeddi Signature Date/Time: 01/02/2023/12:31:33 PM    Final    DG Chest Port 1 View  Result Date: 01/01/2023 CLINICAL DATA:  Possible sepsis. EXAM: PORTABLE CHEST 1 VIEW COMPARISON:  Chest radiograph 01/17/2021 FINDINGS: Normal cardiomediastinal contours. There is diffuse bilateral mild coarsening of the interstitium which is similar to prior. No new focal consolidation. No pneumothorax or pleural effusion. No acute finding in the visualized skeleton. IMPRESSION: 1. No acute cardiopulmonary finding. 2. Diffuse bilateral mild coarsening of the interstitium is similar to prior and likely chronic. Electronically Signed   By: Emmaline Kluver M.D.   On: 01/01/2023 19:25    Scheduled Meds:  acetaZOLAMIDE ER  500 mg Oral BID   amLODipine  5 mg Oral Daily   aspirin EC  81 mg Oral Daily   atorvastatin  10 mg Oral QPM   benztropine  0.5 mg Oral BID   colchicine  0.6 mg Oral Daily   divalproex  1,000 mg Oral QHS   gabapentin  400 mg Oral QID   heparin  5,000 Units Subcutaneous Q8H   hydrOXYzine  25 mg Oral TID   insulin aspart  0-15 Units Subcutaneous TID WC   insulin aspart  0-5 Units Subcutaneous QHS   insulin detemir  15 Units Subcutaneous QHS   pantoprazole  40 mg Oral Daily    polyethylene glycol  17 g Oral Daily   QUEtiapine  800 mg Oral QHS   risperiDONE  3 mg Oral BID   umeclidinium bromide  1 puff Inhalation Daily   Continuous Infusions:  cefTRIAXone (ROCEPHIN)  IV Stopped (01/02/23 2100)     LOS: 2 days   Time spent: 44 mins  Bryttani Blew Laural Benes, MD How to contact the [facility] Attending or Consulting provider 7A - 7P or covering provider during after hours 7P -7A, for this patient?  Check the care team in [facility] and look for a) attending/consulting [facility] provider listed and b) the [facility] team listed Log into [email or web address] and use [place]'s universal password to access. If you do not have the password, please contact the hospital operator. Locate the [facility] provider you are looking for under [facility] and page to a number that you can be directly reached. If you still have difficulty reaching the provider, please page the [facility] (Director on Call) for the Hospitalists listed on amion for assistance.  01/03/2023, 1:24 PM

## 2023-01-03 NOTE — Progress Notes (Signed)
Pt has refused definity for the second time.

## 2023-01-03 NOTE — Hospital Course (Signed)
57 y.o. female with medical history significant of anemia, breast cancer, psychiatric disorder, hypertension, neuropathy presents the ED with a chief complaint of fever.  Patient's baseline mentation is unclear.  At this time she is slightly confused.  She reports she does not remember what happened that brought her into the hospital.  When asked her if she had a fever she said oh yes she did.  Then she reported that she had urinary frequency.  She admits to hematuria as well.  Patient reports that she had some nausea and vomiting that was nonbloody.  She does not remember which day she had the nausea and vomiting if it was 2 days ago or if it was the day of presentation.  She reports that she has not been on any recent antibiotics.  She was recently seen in our ER for abdominal pain.  At that time she was complaining of right lower abdominal pain.  Her pain had been attributed to eating too much, and had resolved after vomiting yesterday.  She had no urinary symptoms yesterday.   Patient reports that she has a lot of fluid retention at baseline.  She reports that she drinks more fluids than she eats as a way to avoid gaining weight.  She has pitting edema all the way up to the middle of her abdomen, abdomen is taut.  Patient also reports that her insulin regimen was recently changed with reduced insulin from 25 to 20 units at night.   Patient has no other complaints or concerns at this time   Patient reports she smokes, but would like Nicorette instead of nicotine patch.  She reports that she does not drink, does not use illicit drugs.  She is vaccinated for COVID.  Patient would like to be full code.

## 2023-01-04 DIAGNOSIS — F99 Mental disorder, not otherwise specified: Secondary | ICD-10-CM | POA: Diagnosis not present

## 2023-01-04 DIAGNOSIS — R600 Localized edema: Secondary | ICD-10-CM | POA: Diagnosis not present

## 2023-01-04 DIAGNOSIS — N10 Acute tubulo-interstitial nephritis: Secondary | ICD-10-CM | POA: Diagnosis not present

## 2023-01-04 DIAGNOSIS — E876 Hypokalemia: Secondary | ICD-10-CM | POA: Diagnosis not present

## 2023-01-04 LAB — BLOOD CULTURE ID PANEL (REFLEXED) - BCID2

## 2023-01-04 LAB — URINE CULTURE: Culture: >=100000 — AB

## 2023-01-04 LAB — CBC
HCT: 36.8 % (ref 36.0–46.0)
Hemoglobin: 11.7 g/dL — ABNORMAL LOW (ref 12.0–15.0)
MCH: 29.7 pg (ref 26.0–34.0)
MCHC: 31.8 g/dL (ref 30.0–36.0)
MCV: 93.4 fL (ref 80.0–100.0)
Platelets: 212 10*3/uL (ref 150–400)
RBC: 3.94 MIL/uL (ref 3.87–5.11)
RDW: 13.4 % (ref 11.5–15.5)
WBC: 5.8 10*3/uL (ref 4.0–10.5)
nRBC: 0 % (ref 0.0–0.2)

## 2023-01-04 LAB — BASIC METABOLIC PANEL
Anion gap: 8 (ref 5–15)
BUN: 24 mg/dL — ABNORMAL HIGH (ref 6–20)
CO2: 19 mmol/L — ABNORMAL LOW (ref 22–32)
Calcium: 8.3 mg/dL — ABNORMAL LOW (ref 8.9–10.3)
Chloride: 111 mmol/L (ref 98–111)
Creatinine, Ser: 0.62 mg/dL (ref 0.44–1.00)
GFR, Estimated: >60 mL/min (ref >60)
Glucose, Bld: 119 mg/dL — ABNORMAL HIGH (ref 70–99)
Potassium: 3.5 mmol/L (ref 3.5–5.1)
Sodium: 138 mmol/L (ref 135–145)

## 2023-01-04 LAB — CULTURE, BLOOD (ROUTINE X 2)

## 2023-01-04 LAB — GLUCOSE, CAPILLARY
Glucose-Capillary: 101 mg/dL — ABNORMAL HIGH (ref 70–99)
Glucose-Capillary: 181 mg/dL — ABNORMAL HIGH (ref 70–99)
Glucose-Capillary: 251 mg/dL — ABNORMAL HIGH (ref 70–99)

## 2023-01-04 LAB — MAGNESIUM: Magnesium: 2.4 mg/dL (ref 1.7–2.4)

## 2023-01-04 MED ORDER — TRAMADOL HCL 50 MG PO TABS: 50.0000 mg | ORAL_TABLET | Freq: Three times a day (TID) | ORAL | 0 refills | Status: DC | PRN

## 2023-01-04 MED ORDER — OZEMPIC (0.25 OR 0.5 MG/DOSE) 2 MG/1.5ML ~~LOC~~ SOPN: 0.2500 mg | PEN_INJECTOR | SUBCUTANEOUS | 4 refills | Status: DC

## 2023-01-04 MED ORDER — TAMOXIFEN CITRATE 20 MG PO TABS: 20.0000 mg | ORAL_TABLET | Freq: Every day | ORAL | 4 refills | Status: AC

## 2023-01-04 MED ORDER — ACETAZOLAMIDE ER 500 MG PO CP12: 500.0000 mg | ORAL_CAPSULE | Freq: Two times a day (BID) | ORAL | 3 refills | Status: AC

## 2023-01-04 MED ORDER — INSULIN GLARGINE 100 UNIT/ML ~~LOC~~ SOLN: 25.0000 [IU] | Freq: Every day | SUBCUTANEOUS | 11 refills | Status: DC

## 2023-01-04 MED ORDER — DIVALPROEX SODIUM ER 500 MG PO TB24: 1000.0000 mg | ORAL_TABLET | Freq: Every day | ORAL | 5 refills | Status: DC

## 2023-01-04 MED ORDER — TIOTROPIUM BROMIDE MONOHYDRATE 18 MCG IN CAPS: 18.0000 ug | ORAL_CAPSULE | Freq: Every day | RESPIRATORY_TRACT | 12 refills | Status: DC

## 2023-01-04 MED ORDER — ASPIRIN 81 MG PO TBEC: 81.0000 mg | DELAYED_RELEASE_TABLET | Freq: Every day | ORAL | 2 refills | Status: AC

## 2023-01-04 MED ORDER — AMLODIPINE BESYLATE 5 MG PO TABS: 5.0000 mg | ORAL_TABLET | Freq: Every day | ORAL | 5 refills | Status: AC

## 2023-01-04 MED ORDER — CIPROFLOXACIN HCL 250 MG PO TABS
500.0000 mg | ORAL_TABLET | Freq: Two times a day (BID) | ORAL | Status: DC
  Administered 2023-01-04: 500 mg via ORAL
  Filled 2023-01-04: qty 2

## 2023-01-04 MED ORDER — QUETIAPINE FUMARATE ER 400 MG PO TB24: 800.0000 mg | ORAL_TABLET | Freq: Every day | ORAL | 5 refills | Status: DC

## 2023-01-04 MED ORDER — RISPERIDONE 3 MG PO TBDP: 3.0000 mg | ORAL_TABLET | Freq: Every morning | ORAL | 5 refills | Status: DC

## 2023-01-04 MED ORDER — FUROSEMIDE 40 MG PO TABS: 40.0000 mg | ORAL_TABLET | Freq: Every day | ORAL | 0 refills | Status: DC | PRN

## 2023-01-04 MED ORDER — SODIUM CHLORIDE 0.9 % IV SOLN
2.0000 g | INTRAVENOUS | Status: DC
  Administered 2023-01-04: 2 g via INTRAVENOUS
  Filled 2023-01-04: qty 20

## 2023-01-04 MED ORDER — METFORMIN HCL 1000 MG PO TABS: 1000.0000 mg | ORAL_TABLET | Freq: Two times a day (BID) | ORAL | 5 refills | Status: AC

## 2023-01-04 MED ORDER — POTASSIUM CHLORIDE ER 10 MEQ PO TBCR: 10.0000 meq | EXTENDED_RELEASE_TABLET | Freq: Every day | ORAL | 1 refills | Status: AC

## 2023-01-04 MED ORDER — CIPROFLOXACIN HCL 500 MG PO TABS: 500.0000 mg | ORAL_TABLET | Freq: Two times a day (BID) | ORAL | 0 refills | Status: DC

## 2023-01-04 MED ORDER — SENNA-DOCUSATE SODIUM 8.6-50 MG PO TABS: 2.0000 | ORAL_TABLET | Freq: Every day | ORAL | 4 refills | Status: DC

## 2023-01-04 MED ORDER — GABAPENTIN 400 MG PO CAPS: 400.0000 mg | ORAL_CAPSULE | Freq: Three times a day (TID) | ORAL | 0 refills | Status: AC

## 2023-01-04 MED ORDER — SENNA-DOCUSATE SODIUM 8.6-50 MG PO TABS: 2.0000 | ORAL_TABLET | Freq: Two times a day (BID) | ORAL | 4 refills | Status: DC

## 2023-01-04 MED ORDER — COLCHICINE 0.6 MG PO TABS: 0.6000 mg | ORAL_TABLET | Freq: Every day | ORAL | 5 refills | Status: AC

## 2023-01-04 MED ORDER — ATORVASTATIN CALCIUM 10 MG PO TABS: 10.0000 mg | ORAL_TABLET | Freq: Every evening | ORAL | 5 refills | Status: AC

## 2023-01-04 NOTE — Discharge Summary (Addendum)
Regina Watson, is a 57 y.o. female  DOB [birth year]  MRN [id number].  Admission date:  01/01/2023  Admitting Physician  Lilyan Gilford, DO  Discharge Date:  01/04/2023   Primary MD  Annita Brod, MD  Recommendations for primary care physician for things to follow:   1)Please Take ciprofloxacin antibiotic 500 mg twice daily for 5 days for urinary tract infection 2) stop Farxiga (dapagliflozin) due to concerns about increased risk of  urinary tract infection  Admission Diagnosis  Pyelonephritis [N12] Sepsis (HCC) [A41.9]   Discharge Diagnosis  Pyelonephritis [N12] Sepsis (HCC) [A41.9]    Principal Problem:   Sepsis (HCC) Active Problems:   Essential hypertension   Tobacco use   Type 2 diabetes mellitus without complication (HCC)   UTI (urinary tract infection)   Hyperlipidemia   Psychiatric disorder   Acute pyelonephritis   Peripheral edema   Hypokalemia      Past Medical History:  Diagnosis Date   Anemia    Breast cancer (HCC)    Diabetes mellitus without complication (HCC)    Hypertension    MDD (major depressive disorder)    Neuropathy 2010   Schizophrenia (HCC)     Past Surgical History:  Procedure Laterality Date   BIOPSY  04/12/2022   Procedure: BIOPSY;  Surgeon: Dolores Frame, MD;  [facility]: [facility];  Service: Gastroenterology;;   COLONOSCOPY WITH PROPOFOL N/A 04/12/2022   Procedure: COLONOSCOPY WITH PROPOFOL;  Surgeon: Dolores Frame, MD;  [facility]: [facility];  Service: Gastroenterology;  Laterality: N/A;  1230 ASA [facility]   ESOPHAGOGASTRODUODENOSCOPY (EGD) WITH PROPOFOL N/A 04/12/2022   Procedure: ESOPHAGOGASTRODUODENOSCOPY (EGD) WITH PROPOFOL;  Surgeon: Dolores Frame, MD;  [facility]: [facility];  Service: Gastroenterology;  Laterality: N/A;   INCISION AND DRAINAGE ABSCESS Left 05/08/2022    Procedure: INCISION AND DRAINAGE ABSCESS of left breast abscess;  Surgeon: Lewie Chamber, DO;  Location: [facility];  Service: General;  Laterality: Left;   TOE AMPUTATION       HPI  from the history and physical done on the day of admission:   HPI: Regina Watson is a 57 y.o. female with medical history significant of anemia, breast cancer, psychiatric disorder, hypertension, neuropathy presents the ED with a chief complaint of fever.  Patient's baseline mentation is unclear.  At this time she is slightly confused.  She reports she does not remember what happened that brought her into the hospital.  When asked her if she had a fever she said oh yes she did.  Then she reported that she had urinary frequency.  She admits to hematuria as well.  Patient reports that she had some nausea and vomiting that was nonbloody.  She does not remember which day she had the nausea and vomiting if it was 2 days ago or if it was the day of presentation.  She reports that she has not been on any recent antibiotics.  She was recently seen in our ER for abdominal  pain.  At that time she was complaining of right lower abdominal pain.  Her pain had been attributed to eating too much, and had resolved after vomiting yesterday.  She had no urinary symptoms yesterday.   Patient reports that she has a lot of fluid retention at baseline.  She reports that she drinks more fluids than she eats as a way to avoid gaining weight.  She has pitting edema all the way up to the middle of her abdomen, abdomen is taut.  Patient also reports that her insulin regimen was recently changed with reduced insulin from 25 to 20 units at night.   Patient has no other complaints or concerns at this time   Patient reports she smokes, but would like Nicorette instead of nicotine patch.  She reports that she does not drink, does not use illicit drugs.  She is vaccinated for COVID.  Patient would like to be full code. Review of Systems: As  mentioned in the history of present illness. All other systems reviewed and are negative.   Hospital Course:   Enterobacter Aerogenes Sepsis --from urinary source--POA - Patient presented with leukocytosis of 14.0, temperature of 103.1, heart rate up to 130, respiratory rate 26 -Urine culture from 01/01/2023 with Enterobacter Aerogenes -Blood culture from 01/01/2023 NGTD - CT abdomen pelvis showed right-sided pyelonephritis -Treated with IV Rocephin with significant improvement -Sepsis pathophysiology has resolved -Okay to discharge on oral Cipro per sensitivity report   Hypokalemia--in the setting of Diamox use - -Replaced -Discharge on potassium chloride supplementation while on acetazolamide/Diamox   Peripheral edema due to systolic dysfunction CHF/HFrEF - Patient has peripheral edema all the way to the middle of her abdomen - No documented history of CHF - Echo done 01/02/23, possible new reduction in LVEF to 45-50% but suboptimal study. Cardiologist requested limited echo with definity for better interpretation but patient has refused twice and tech canceled procedure. This is unfortunate because there was mention of possible regional wall motion abnormalities but cardiologist wanted study done with contrast to confirm.  -Improved with IV Lasix -Discharged on PTA acetazolamide/Diamox -- DC dapagliflozin in setting of UTI    Acute pyelonephritis - CT abdomen shows right-sided pyelonephritis -Antibiotics, urine and blood cultures as above #1 - DC dapagliflozin in setting of UTI    Psychiatric disorder - Continue Atarax, Seroquel, Risperdal   Hyperlipidemia - Continue statin   Type 2 diabetes mellitus without complication - -A1c 6.2 reflecting good diabetic control PTA -Farxiga/dapagliflozin has been discontinued due to UTI concerns  Tobacco use - Current smoker - Counseled on importance of cessation - Declines nicotine patch but request nicotine gum   Essential  hypertension - Continue Norvasc  Code Status: full   Disposition: Discharge back to group home   Discharge Condition: stable  Follow UP   Follow-up Information     Annita Brod, MD Follow up in 1 week(s).   Specialty: Internal Medicine Why: recheck Contact information: [address] [place] [address] [phone]                 Diet and Activity recommendation:  As advised  Discharge Instructions    Discharge Instructions     Ambulatory Referral for Lung Cancer Scre   Complete by: As directed    Call MD for:  difficulty breathing, headache or visual disturbances   Complete by: As directed    Call MD for:  persistant dizziness or light-headedness   Complete by: As directed    Call MD  for:  persistant nausea and vomiting   Complete by: As directed    Call MD for:  severe uncontrolled pain   Complete by: As directed    Call MD for:  temperature >100.4   Complete by: As directed    Diet - low sodium heart healthy   Complete by: As directed    Discharge instructions   Complete by: As directed    1)Please Take ciprofloxacin antibiotic 500 mg twice daily for 5 days for urinary tract infection 2) stop Farxiga (dapagliflozin) due to concerns about increased risk of  urinary tract infection   Increase activity slowly   Complete by: As directed          Discharge Medications     Allergies as of 01/04/2023       Reactions   Ziprasidone Hcl    Other reaction(s): Other Psychotic reaction Psychotic reaction   Penicillinase Rash, Swelling   Sulfa Antibiotics Other (See Comments)   Sulfamethoxazole-trimethoprim Hives, Other (See Comments)   Other Reaction(s): Dermatitis Bullous lesions  unknown   Penicillins    Has patient had a PCN reaction causing immediate rash, facial/tongue/throat swelling, SOB or lightheadedness with hypotension: UNKNOWN Has patient had a PCN reaction causing severe rash involving mucus membranes or skin necrosis:  UNKNOWN Has patient had a PCN reaction that required hospitalization: UNKNOWN Has patient had a PCN reaction occurring within the last 10 years: UNKNOWN If all of the above answers are "NO", then may proceed with Cephalosporin use.   Latex Itching        Medication List     STOP taking these medications    doxycycline 100 MG capsule Commonly known as: VIBRAMYCIN   Farxiga 10 MG Tabs tablet Generic drug: dapagliflozin propanediol   GoodSense Best Fiber Powd   GOODSENSE FIBER PO   mupirocin ointment 2 % Commonly known as: BACTROBAN   ondansetron 4 MG tablet Commonly known as: Zofran   sucralfate 1 g tablet Commonly known as: CARAFATE       TAKE these medications    acetaminophen 325 MG tablet Commonly known as: TYLENOL Take 650 mg by mouth every 6 (six) hours as needed for mild pain or moderate pain.   acetaZOLAMIDE ER 500 MG capsule Commonly known as: DIAMOX Take 1 capsule (500 mg total) by mouth 2 (two) times daily.   albuterol 108 (90 Base) MCG/ACT inhaler Commonly known as: VENTOLIN HFA Inhale 2 puffs into the lungs every 4 (four) hours as needed for wheezing or shortness of breath.   amLODipine 5 MG tablet Commonly known as: NORVASC Take 1 tablet (5 mg total) by mouth daily.   aspirin EC 81 MG tablet Take 1 tablet (81 mg total) by mouth daily with breakfast. What changed:  when to take this Another medication with the same name was removed. Continue taking this medication, and follow the directions you see here.   atorvastatin 10 MG tablet Commonly known as: LIPITOR Take 1 tablet (10 mg total) by mouth every evening.   benztropine 0.5 MG tablet Commonly known as: COGENTIN Take 0.5 mg by mouth 2 (two) times daily.   ciprofloxacin 500 MG tablet Commonly known as: Cipro Take 1 tablet (500 mg total) by mouth 2 (two) times daily for 5 days.   colchicine 0.6 MG tablet Take 1 tablet (0.6 mg total) by mouth daily.   divalproex 500 MG 24 hr  tablet Commonly known as: DEPAKOTE ER Take 2 tablets (1,000 mg total) by mouth at bedtime.  furosemide 40 MG tablet Commonly known as: LASIX Take 1 tablet (40 mg total) by mouth daily as needed for fluid or edema. If 3Lb weight gain What changed: additional instructions   gabapentin 400 MG capsule Commonly known as: NEURONTIN Take 1 capsule (400 mg total) by mouth 3 (three) times daily. What changed: when to take this   hydrOXYzine 25 MG tablet Commonly known as: ATARAX Take 25 mg by mouth 3 (three) times daily.   insulin glargine 100 UNIT/ML injection Commonly known as: LANTUS Inject 0.25 mLs (25 Units total) into the skin at bedtime. What changed: when to take this   levocetirizine 5 MG tablet Commonly known as: XYZAL Take 5 mg by mouth daily.   metFORMIN 1000 MG tablet Commonly known as: GLUCOPHAGE Take 1 tablet (1,000 mg total) by mouth 2 (two) times daily with a meal.   nicotine polacrilex 2 MG gum Commonly known as: NICORETTE Take 2 mg by mouth as needed for smoking cessation.   NovoLOG FlexPen 100 UNIT/ML FlexPen Generic drug: insulin aspart Inject 11-15 Units into the skin 3 (three) times daily with meals. 150-200=11units,201-250=12units, 251-300=13units, 301-350=14units, 351-400=15units   omeprazole 40 MG capsule Commonly known as: PRILOSEC Take 1 capsule (40 mg total) by mouth daily.   Ozempic (0.25 or 0.5 MG/DOSE) 2 MG/1.5ML Sopn Generic drug: Semaglutide(0.25 or 0.5MG /DOS) Inject 0.25 mg into the skin every Friday. Start taking on: Jan 06, 2023 What changed: how to take this   polyethylene glycol 17 g packet Commonly known as: MIRALAX / GLYCOLAX Take 17 g by mouth daily.   potassium chloride 10 MEQ tablet Commonly known as: KLOR-CON Take 1 tablet (10 mEq total) by mouth daily. Take While taking Diamox/acetazolamide   QUEtiapine 400 MG 24 hr tablet Commonly known as: SEROQUEL XR Take 2 tablets (800 mg total) by mouth at bedtime.   Refresh Tears  0.5 % Soln Generic drug: carboxymethylcellulose Place 1 drop into both eyes 2 (two) times daily.   risperiDONE 3 MG disintegrating tablet Commonly known as: RISPERDAL M-TABS Take 1 tablet (3 mg total) by mouth every morning. What changed: when to take this   sennosides-docusate sodium 8.6-50 MG tablet Commonly known as: SENOKOT-S Take 2 tablets by mouth at bedtime. What changed:  how much to take when to take this   Systane Ultra PF 0.4-0.3 % Soln Generic drug: Polyethyl Glyc-Propyl Glyc PF Apply 2 drops to eye See admin instructions. Bid to tid depending upon dryness of eyes   tamoxifen 20 MG tablet Commonly known as: NOLVADEX Take 1 tablet (20 mg total) by mouth daily.   tiotropium 18 MCG inhalation capsule Commonly known as: SPIRIVA Place 1 capsule (18 mcg total) into inhaler and inhale daily.   traMADol 50 MG tablet Commonly known as: ULTRAM Take 1 tablet (50 mg total) by mouth every 8 (eight) hours as needed for severe pain or moderate pain. What changed:  when to take this reasons to take this   Vitamin D (Ergocalciferol) 1.25 MG (50000 UNIT) Caps capsule Commonly known as: DRISDOL Take 50,000 Units by mouth every Saturday.        Major procedures and Radiology Reports - PLEASE review detailed and final reports for all details, in brief -   ECHOCARDIOGRAM COMPLETE  Result Date: 01/02/2023    ECHOCARDIOGRAM REPORT   Patient Name:   AIREN GOLDRICK Date of Exam: 01/02/2023 Medical Rec #:  [id number]         Height:       69.0 in Accession #:  [id number]        Weight:       188.5 lb Date of Birth:  Jun 25, 1966          BSA:          2.015 m Patient Age:    57 years          BP:           146/74 mmHg Patient Gender: F                 HR:           107 bpm. [facility] Procedure: 2D Echo, Cardiac Doppler and Color Doppler Indications:    Edema [782.3.ICD-9-CM]  History:        Patient has prior history of Echocardiogram examinations, most                  recent 10/15/2019. Breast cancer, Signs/Symptoms:Altered Mental                 Status, Shortness of Breath and Edema; Risk                 Factors:Hypertension, Current Smoker, Diabetes and Dyslipidemia.  Sonographer:    Aron Baba Referring Phys: [id number] ASIA B ZIERLE-GHOSH  Sonographer Comments: Suboptimal parasternal window and no apical window. Image acquisition challenging due to patient body habitus and Image acquisition challenging due to respiratory motion. IMPRESSIONS  1. Challenging study. Suboptimal parasternal window and no apical window. Poor quality of echo windows.  2. Left ventricular ejection fraction, by estimation, is 45 to 50%. The left ventricle has mildly decreased function. The left ventricle demonstrates regional wall motion abnormalities, lateral wall appears to be hypokinetic. Suboptimal echo images. Recommend Limited Echo with contrast for accurate estimation of LVEF and RWMA. Indeterminate diastolic filling due to E-A fusion.  3. Right ventricular systolic function was not well visualized. The right ventricular size is normal. Tricuspid regurgitation signal is inadequate for assessing PA pressure.  4. Left atrial size was mildly dilated.  5. The mitral valve is grossly normal. No evidence of mitral valve regurgitation. No evidence of mitral stenosis.  6. The aortic valve was not well visualized. Aortic valve regurgitation is not visualized. No aortic stenosis is present.  7. The inferior vena cava is normal in size with <50% respiratory variability, suggesting right atrial pressure of 8 mmHg. Comparison(s): LVEF decreased from 60-65% in 2021 to 45-50% now although current study is technically challenging and will need to be repeated with Definity for accurate estimation of LVEF and RWMA. FINDINGS  Left Ventricle: Left ventricular ejection fraction, by estimation, is 45 to 50%. The left ventricle has mildly decreased function. The left ventricle demonstrates regional wall motion  abnormalities. The left ventricular internal cavity size was normal in size. There is no left ventricular hypertrophy. Indeterminate diastolic filling due to E-A fusion.  LV Wall Scoring: The entire lateral wall is hypokinetic. Right Ventricle: The right ventricular size is normal. No increase in right ventricular wall thickness. Right ventricular systolic function was not well visualized. Tricuspid regurgitation signal is inadequate for assessing PA pressure. Left Atrium: Left atrial size was mildly dilated. Right Atrium: Right atrial size was normal in size. Pericardium: There is no evidence of pericardial effusion. Mitral Valve: The mitral valve is grossly normal. No evidence of mitral valve regurgitation. No evidence of mitral valve stenosis. Tricuspid Valve: The tricuspid valve is not well visualized. Tricuspid valve regurgitation is not demonstrated. No evidence  of tricuspid stenosis. Aortic Valve: The aortic valve was not well visualized. Aortic valve regurgitation is not visualized. No aortic stenosis is present. Pulmonic Valve: The pulmonic valve was not well visualized. Pulmonic valve regurgitation is not visualized. No evidence of pulmonic stenosis. Aorta: The aortic root and ascending aorta are structurally normal, with no evidence of dilitation. Venous: The inferior vena cava is normal in size with less than 50% respiratory variability, suggesting right atrial pressure of 8 mmHg. IAS/Shunts: No atrial level shunt detected by color flow Doppler.  LEFT VENTRICLE PLAX 2D LVIDd:         4.80 cm     Diastology LVIDs:         3.90 cm     LV e' medial:    8.17 cm/s LV PW:         0.80 cm     LV E/e' medial:  7.3 LV IVS:        1.10 cm     LV e' lateral:   6.95 cm/s LVOT diam:     1.90 cm     LV E/e' lateral: 8.6 LV SV:         31 LV SV Index:   16 LVOT Area:     2.84 cm  LV Volumes (MOD) LV vol d, MOD A4C: 91.6 ml LV vol s, MOD A4C: 28.4 ml LV SV MOD A4C:     91.6 ml RIGHT VENTRICLE RV S prime:     11.40 cm/s  TAPSE (M-mode): 1.4 cm LEFT ATRIUM           Index        RIGHT ATRIUM           Index LA diam:      3.70 cm 1.84 cm/m   RA Area:     19.40 cm LA Vol (A4C): 70.0 ml 34.75 ml/m  RA Volume:   60.50 ml  30.03 ml/m  AORTIC VALVE LVOT Vmax:   67.80 cm/s LVOT Vmean:  47.000 cm/s LVOT VTI:    0.110 m  AORTA Ao Root diam: 2.90 cm Ao Asc diam:  2.80 cm MITRAL VALVE MV Area (PHT): 6.27 cm    SHUNTS MV Decel Time: 121 msec    Systemic VTI:  0.11 m MV E velocity: 59.90 cm/s  Systemic Diam: 1.90 cm MV A velocity: 61.40 cm/s MV E/A ratio:  0.98 Vishnu Priya Mallipeddi Electronically signed by Winfield Rast Mallipeddi Signature Date/Time: 01/02/2023/12:31:33 PM    Final    DG Chest Port 1 View  Result Date: 01/01/2023 CLINICAL DATA:  Possible sepsis. EXAM: PORTABLE CHEST 1 VIEW COMPARISON:  Chest radiograph 01/17/2021 FINDINGS: Normal cardiomediastinal contours. There is diffuse bilateral mild coarsening of the interstitium which is similar to prior. No new focal consolidation. No pneumothorax or pleural effusion. No acute finding in the visualized skeleton. IMPRESSION: 1. No acute cardiopulmonary finding. 2. Diffuse bilateral mild coarsening of the interstitium is similar to prior and likely chronic. Electronically Signed   By: Emmaline Kluver M.D.   On: 01/01/2023 19:25   CT ABDOMEN PELVIS WO CONTRAST  Result Date: 12/31/2022 CLINICAL DATA:  Right lower quadrant pain. EXAM: CT ABDOMEN AND PELVIS WITHOUT CONTRAST TECHNIQUE: Multidetector CT imaging of the abdomen and pelvis was performed following the standard protocol without IV contrast. RADIATION DOSE REDUCTION: This exam was performed according to the departmental dose-optimization program which includes automated exposure control, adjustment of the mA and/or kV according to patient size and/or use of iterative reconstruction  technique. COMPARISON:  Jan 17, 2021 FINDINGS: Lower chest: No acute abnormality. Hepatobiliary: No focal liver abnormality is seen. The  gallbladder is moderately distended. No gallstones, gallbladder wall thickening, or biliary dilatation. Pancreas: Unremarkable. No pancreatic ductal dilatation or surrounding inflammatory changes. Spleen: Normal in size without focal abnormality. Adrenals/Urinary Tract: Adrenal glands are unremarkable. Kidneys are normal in size, without renal calculi, focal lesion, or hydronephrosis. Mild right-sided perinephric inflammatory fat stranding is seen. The urinary bladder is markedly distended and is otherwise unremarkable. Stomach/Bowel: Stomach is within normal limits. Appendix appears normal. Stool is seen throughout the large bowel. No evidence of bowel wall thickening, distention, or inflammatory changes. Vascular/Lymphatic: Aortic atherosclerosis. No enlarged abdominal or pelvic lymph nodes. Reproductive: A stable heterogeneous posterior uterine fibroid is noted. The bilateral adnexa are unremarkable. Other: No abdominal wall hernia or abnormality. No abdominopelvic ascites. Musculoskeletal: No acute or significant osseous findings. IMPRESSION: 1. Findings which may represent sequelae associated with acute right-sided pyelonephritis. Correlation with urinalysis is recommended. 2. Stable heterogeneous posterior uterine fibroid. 3. Aortic atherosclerosis. Aortic Atherosclerosis (ICD10-I70.0). Electronically Signed   By: Aram Candela M.D.   On: 12/31/2022 03:11    Micro Results   Recent Results (from the past 240 hour(s))  Resp panel by RT-PCR (RSV, Flu A&B, Covid) Anterior Nasal Swab     Status: None   Collection Time: 01/01/23  7:10 PM   Specimen: Anterior Nasal Swab  Result Value Ref Range Status   SARS Coronavirus 2 by RT PCR NEGATIVE NEGATIVE Final    Comment: (NOTE) SARS-CoV-2 target nucleic acids are NOT DETECTED.  The SARS-CoV-2 RNA is generally detectable in upper respiratory specimens during the acute phase of infection. The lowest concentration of SARS-CoV-2 viral copies this assay can  detect is 138 copies/mL. A negative result does not preclude SARS-Cov-2 infection and should not be used as the sole basis for treatment or other patient management decisions. A negative result may occur with  improper specimen collection/handling, submission of specimen other than nasopharyngeal swab, presence of viral mutation(s) within the areas targeted by this assay, and inadequate number of viral copies(<138 copies/mL). A negative result must be combined with clinical observations, patient history, and epidemiological information. The expected result is Negative.  Fact Sheet for Patients:  BloggerCourse.com  Fact Sheet for Healthcare Providers:  SeriousBroker.it  This test is no t yet approved or cleared by the [place] FDA and  has been authorized for detection and/or diagnosis of SARS-CoV-2 by FDA under an Emergency Use Authorization (EUA). This EUA will remain  in effect (meaning this test can be used) for the duration of the COVID-19 declaration under Section 564(b)(1) of the Act, 21 U.S.C.section 360bbb-3(b)(1), unless the authorization is terminated  or revoked sooner.       Influenza A by PCR NEGATIVE NEGATIVE Final   Influenza B by PCR NEGATIVE NEGATIVE Final    Comment: (NOTE) The Xpert Xpress SARS-CoV-2/FLU/RSV plus assay is intended as an aid in the diagnosis of influenza from Nasopharyngeal swab specimens and should not be used as a sole basis for treatment. Nasal washings and aspirates are unacceptable for Xpert Xpress SARS-CoV-2/FLU/RSV testing.  Fact Sheet for Patients: BloggerCourse.com  Fact Sheet for Healthcare Providers: SeriousBroker.it  This test is not yet approved or cleared by the [place] FDA and has been authorized for detection and/or diagnosis of SARS-CoV-2 by FDA under an Emergency Use Authorization (EUA). This EUA will remain in  effect (meaning this test can be used) for the duration of the  COVID-19 declaration under Section 564(b)(1) of the Act, 21 U.S.C. section 360bbb-3(b)(1), unless the authorization is terminated or revoked.     Resp Syncytial Virus by PCR NEGATIVE NEGATIVE Final    Comment: (NOTE) Fact Sheet for Patients: BloggerCourse.com  Fact Sheet for Healthcare Providers: SeriousBroker.it  This test is not yet approved or cleared by the [place] FDA and has been authorized for detection and/or diagnosis of SARS-CoV-2 by FDA under an Emergency Use Authorization (EUA). This EUA will remain in effect (meaning this test can be used) for the duration of the COVID-19 declaration under Section 564(b)(1) of the Act, 21 U.S.C. section 360bbb-3(b)(1), unless the authorization is terminated or revoked.  Performed at [facility], [address], [place], [place] [address]   Blood Culture (routine x 2)     Status: None (Preliminary result)   Collection Time: 01/01/23  7:24 PM   Specimen: BLOOD RIGHT ARM  Result Value Ref Range Status   Specimen Description BLOOD RIGHT ARM  Final   Special Requests   Final    BOTTLES DRAWN AEROBIC AND ANAEROBIC Blood Culture adequate volume   Culture   Final    NO GROWTH 3 DAYS Performed at [facility], [address], [place], [place] [address]    Report Status PENDING  Incomplete  Blood Culture (routine x 2)     Status: None (Preliminary result)   Collection Time: 01/01/23  7:28 PM   Specimen: Left Antecubital; Blood  Result Value Ref Range Status   Specimen Description LEFT ANTECUBITAL  Final   Special Requests   Final    BOTTLES DRAWN AEROBIC AND ANAEROBIC Blood Culture adequate volume   Culture   Final    NO GROWTH 3 DAYS Performed at [facility], [address], [place], [place] [address]    Report Status PENDING  Incomplete  Urine Culture     Status: Abnormal   Collection Time: 01/01/23  9:00 PM    Specimen: Urine, Clean Catch  Result Value Ref Range Status   Specimen Description   Final    URINE, CLEAN CATCH Performed at [facility], [address] [place], [place] [address]    Special Requests   Final    NONE Performed at [facility], [address], [place], [place] [address]    Culture >=100,000 COLONIES/mL ENTEROBACTER AEROGENES (A)  Final   Report Status 01/04/2023 FINAL  Final   Organism ID, Bacteria ENTEROBACTER AEROGENES (A)  Final      Susceptibility   Enterobacter aerogenes - MIC*    CEFEPIME <=0.12 SENSITIVE Sensitive     CEFTRIAXONE <=0.25 SENSITIVE Sensitive     CIPROFLOXACIN <=0.25 SENSITIVE Sensitive     GENTAMICIN <=1 SENSITIVE Sensitive     IMIPENEM <=0.25 SENSITIVE Sensitive     NITROFURANTOIN 32 SENSITIVE Sensitive     TRIMETH/SULFA <=20 SENSITIVE Sensitive     PIP/TAZO <=4 SENSITIVE Sensitive     * >=100,000 COLONIES/mL ENTEROBACTER AEROGENES    Today   Subjective    Lanell Persons today has no new complaints No fever  Or chills   No Nausea, Vomiting or Diarrhea =-Eating and drinking well         Patient has been seen and examined prior to discharge   Objective   Blood pressure 120/70, pulse 91, temperature 98.8 F (37.1 C), resp. rate 16, height 5\' 9"  (1.753 m), weight 85.5 kg, SpO2 98 %.   Intake/Output Summary (Last 24 hours) at 01/04/2023 1611 Last data filed at 01/04/2023  1300 Gross per 24 hour  Intake 600 ml  Output --  Net 600 ml    Exam Gen:- Awake Alert, no acute distress  HEENT:- [place].AT, No sclera icterus, legally blind Neck-Supple Neck,No JVD,.  Lungs-  CTAB , good air movement bilaterally CV- S1, S2 normal, regular Abd-  +ve B.Sounds, Abd Soft, No tenderness, no CVA area tenderness Extremity/Skin:-Improved lower extremity edema,   good pulses Psych-affect is appropriate, oriented x3 Neuro-no new focal deficits, no tremors    Data Review   CBC w Diff:  Lab Results  Component Value Date   WBC 5.8 01/04/2023    HGB 11.7 (L) 01/04/2023   HCT 36.8 01/04/2023   PLT 212 01/04/2023   LYMPHOPCT 4 01/02/2023   MONOPCT 6 01/02/2023   EOSPCT 0 01/02/2023   BASOPCT 0 01/02/2023    CMP:  Lab Results  Component Value Date   NA 138 01/04/2023   K 3.5 01/04/2023   CL 111 01/04/2023   CO2 19 (L) 01/04/2023   BUN 24 (H) 01/04/2023   CREATININE 0.62 01/04/2023   PROT 8.7 (H) 01/02/2023   ALBUMIN 3.6 01/02/2023   BILITOT 0.9 01/02/2023   ALKPHOS 98 01/02/2023   AST 16 01/02/2023   ALT 14 01/02/2023   Total Discharge time is about 33 minutes  Shon Hale M.D on 01/04/2023 at 4:11 PM  Go to [email or web address] -  for contact info  [facility] - Office  [phone]

## 2023-01-04 NOTE — Discharge Instructions (Signed)
1)Please Take ciprofloxacin antibiotic 500 mg twice daily for 5 days for urinary tract infection 2) stop Farxiga (dapagliflozin) due to concerns about increased risk of  urinary tract infection

## 2023-01-04 NOTE — NC FL2 (Signed)
[place] MEDICAID [id number] LEVEL OF CARE FORM     IDENTIFICATION  Patient Name: Regina Watson Birthdate: 12/20/1965 Sex: female Admission Date (Current Location): 01/01/2023  [facility] and [place] Number:  [employer] and Address:  [facility],  [address] [place] [address]      Provider Number: [id number]  Attending Physician Name and Address:  Shon Hale, MD  Relative Name and Phone Number:       Current Level of Care: Hospital Recommended Level of Care: [facility] Prior Approval Number:    Date Approved/Denied:   PASRR Number:    Discharge Plan: Other (Comment) [facility])    Current Diagnoses: Patient Active Problem List   Diagnosis Date Noted   Psychiatric disorder 01/02/2023   Acute pyelonephritis 01/02/2023   Peripheral edema 01/02/2023   Hypokalemia 01/02/2023   Sepsis (HCC) 01/01/2023   Callus 07/05/2022   Abscess 05/08/2022   Breast abscess    Dysphagia 03/05/2022   Nausea and vomiting 03/03/2022   Early satiety 03/03/2022   Constipation 03/03/2022   Loss of weight 03/03/2022   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   Acute metabolic encephalopathy 01/17/2021   Uterine mass 01/17/2021   Altered mental status 10/15/2020   Leukocytosis 10/15/2020   AKI (acute kidney injury) (HCC) 10/15/2020   Hyperglycemia due to diabetes mellitus (HCC) 10/15/2020   Hyperlipidemia 10/15/2020   Diabetic neuropathy (HCC) 10/15/2020   UTI (urinary tract infection) 10/15/2019   Encephalopathy acute 10/14/2019   Schizophrenia (HCC) 10/14/2019   Type 2 diabetes mellitus without complication (HCC) 10/14/2019   Acute encephalopathy 10/14/2019   Palpitations 10/04/2016   Essential hypertension 10/04/2016   Tobacco use 10/04/2016   Tachycardia 10/04/2016    Orientation RESPIRATION BLADDER Height & Weight     Self, Situation, Time, Place  Normal Incontinent Weight: 188 lb 7.9 oz (85.5 kg) Height:   5\' 9"  (175.3 cm)  BEHAVIORAL SYMPTOMS/MOOD NEUROLOGICAL BOWEL NUTRITION STATUS      Incontinent Diet (regular)  AMBULATORY STATUS COMMUNICATION OF NEEDS Skin   Supervision Verbally Normal                       Personal Care Assistance Level of Assistance  Bathing, Feeding, Dressing Bathing Assistance: Limited assistance Feeding assistance: Limited assistance Dressing Assistance: Limited assistance     Functional Limitations Info  Sight, Hearing, Speech Sight Info: Impaired Hearing Info: Adequate Speech Info: Adequate    SPECIAL CARE FACTORS FREQUENCY                       Contractures Contractures Info: Not present    Additional Factors Info  Code Status, Allergies Code Status Info: Full Allergies Info: Ziprasidone Hcl, Penicillinase, Sulfa Antibiotics, Sulfamethoxazole-trimethoprim, Penicillins, Latex           Current Medications (01/04/2023):  This is the current hospital active medication list Current Facility-Administered Medications  Medication Dose Route Frequency Provider Last Rate Last Admin   acetaminophen (TYLENOL) tablet 650 mg  650 mg Oral Q6H PRN Zierle-Ghosh, Asia B, DO       Or   acetaminophen (TYLENOL) suppository 650 mg  650 mg Rectal Q6H PRN Zierle-Ghosh, Asia B, DO       acetaZOLAMIDE ER (DIAMOX) 12 hr capsule 500 mg  500 mg Oral BID Zierle-Ghosh, Asia B, DO   500 mg at 01/04/23 0935   amLODipine (NORVASC) tablet 5 mg  5 mg Oral Daily Zierle-Ghosh,  Asia B, DO   5 mg at 01/04/23 0936   aspirin EC tablet 81 mg  81 mg Oral Daily Johnson, Clanford L, MD   81 mg at 01/04/23 0943   atorvastatin (LIPITOR) tablet 10 mg  10 mg Oral QPM Zierle-Ghosh, Asia B, DO   10 mg at 01/03/23 1749   benztropine (COGENTIN) tablet 0.5 mg  0.5 mg Oral BID Laural Benes, Clanford L, MD   0.5 mg at 01/04/23 0936   ciprofloxacin (CIPRO) tablet 500 mg  500 mg Oral BID Shon Hale, MD   500 mg at 01/04/23 0981   colchicine tablet 0.6 mg  0.6 mg Oral Daily Johnson,  Clanford L, MD   0.6 mg at 01/04/23 0937   divalproex (DEPAKOTE ER) 24 hr tablet 1,000 mg  1,000 mg Oral QHS Johnson, Clanford L, MD   1,000 mg at 01/03/23 2141   gabapentin (NEURONTIN) capsule 400 mg  400 mg Oral QID Laural Benes, Clanford L, MD   400 mg at 01/04/23 0936   heparin injection 5,000 Units  5,000 Units Subcutaneous Q8H Zierle-Ghosh, Asia B, DO   5,000 Units at 01/04/23 0528   hydrOXYzine (ATARAX) tablet 25 mg  25 mg Oral TID Zierle-Ghosh, Asia B, DO   25 mg at 01/04/23 0936   insulin aspart (novoLOG) injection 0-15 Units  0-15 Units Subcutaneous TID WC Zierle-Ghosh, Asia B, DO   3 Units at 01/04/23 1207   insulin aspart (novoLOG) injection 0-5 Units  0-5 Units Subcutaneous QHS Zierle-Ghosh, Asia B, DO       insulin detemir (LEVEMIR) injection 15 Units  15 Units Subcutaneous QHS Zierle-Ghosh, Asia B, DO   15 Units at 01/03/23 2145   nicotine polacrilex (NICORETTE) gum 2 mg  2 mg Oral PRN Zierle-Ghosh, Asia B, DO       ondansetron (ZOFRAN) tablet 4 mg  4 mg Oral Q6H PRN Zierle-Ghosh, Asia B, DO       Or   ondansetron (ZOFRAN) injection 4 mg  4 mg Intravenous Q6H PRN Zierle-Ghosh, Asia B, DO       oxyCODONE (Oxy IR/ROXICODONE) immediate release tablet 5 mg  5 mg Oral Q4H PRN Zierle-Ghosh, Asia B, DO   5 mg at 01/04/23 0949   pantoprazole (PROTONIX) EC tablet 40 mg  40 mg Oral Daily Johnson, Clanford L, MD   40 mg at 01/04/23 0937   polyethylene glycol (MIRALAX / GLYCOLAX) packet 17 g  17 g Oral Daily Johnson, Clanford L, MD   17 g at 01/04/23 1914   polyvinyl alcohol (LIQUIFILM TEARS) 1.4 % ophthalmic solution 2 drop  2 drop Both Eyes BID PRN Johnson, Clanford L, MD       QUEtiapine (SEROQUEL XR) 24 hr tablet 800 mg  800 mg Oral QHS Zierle-Ghosh, Asia B, DO   800 mg at 01/03/23 2143   risperiDONE (RISPERDAL M-TABS) disintegrating tablet 3 mg  3 mg Oral BID Zierle-Ghosh, Asia B, DO   3 mg at 01/04/23 0935   umeclidinium bromide (INCRUSE ELLIPTA) 62.5 MCG/ACT 1 puff  1 puff Inhalation Daily  Johnson, Clanford L, MD   1 puff at 01/04/23 0848     Discharge Medications:  STOP taking these medications     doxycycline 100 MG capsule Commonly known as: VIBRAMYCIN    Farxiga 10 MG Tabs tablet Generic drug: dapagliflozin propanediol    sucralfate 1 g tablet Commonly known as: CARAFATE           TAKE these medications     acetaminophen  325 MG tablet Commonly known as: TYLENOL Take 650 mg by mouth every 6 (six) hours as needed for mild pain or moderate pain.    acetaZOLAMIDE ER 500 MG capsule Commonly known as: DIAMOX Take 1 capsule (500 mg total) by mouth 2 (two) times daily.    albuterol 108 (90 Base) MCG/ACT inhaler Commonly known as: VENTOLIN HFA Inhale 2 puffs into the lungs every 4 (four) hours as needed for wheezing or shortness of breath.    amLODipine 5 MG tablet Commonly known as: NORVASC Take 1 tablet (5 mg total) by mouth daily.    aspirin EC 81 MG tablet Take 1 tablet (81 mg total) by mouth daily with breakfast. What changed:  when to take this Another medication with the same name was removed. Continue taking this medication, and follow the directions you see here.    atorvastatin 10 MG tablet Commonly known as: LIPITOR Take 1 tablet (10 mg total) by mouth every evening.    benztropine 0.5 MG tablet Commonly known as: COGENTIN Take 0.5 mg by mouth 2 (two) times daily.    ciprofloxacin 500 MG tablet Commonly known as: Cipro Take 1 tablet (500 mg total) by mouth 2 (two) times daily for 5 days.    colchicine 0.6 MG tablet Take 1 tablet (0.6 mg total) by mouth daily.    divalproex 500 MG 24 hr tablet Commonly known as: DEPAKOTE ER Take 2 tablets (1,000 mg total) by mouth at bedtime.    furosemide 40 MG tablet Commonly known as: LASIX Take 1 tablet (40 mg total) by mouth daily as needed for fluid or edema.    gabapentin 400 MG capsule Commonly known as: NEURONTIN Take 1 capsule (400 mg total) by mouth 3 (three) times daily. What  changed: when to take this    GoodSense Best Fiber Powd STIR (2) TEASPOONFUL INTO 4-8 OUNCES OF BEVERAGE OR SOFT FOOD THREE TIMES DAILY WITH MEALS. **STIR WELL UNTIL DISSOLVED.**    GOODSENSE FIBER PO Take 2 Scoops by mouth 3 (three) times daily. 2 tspn into 4-8 ounces of beverage    hydrOXYzine 25 MG tablet Commonly known as: ATARAX Take 25 mg by mouth 3 (three) times daily.    insulin glargine 100 UNIT/ML injection Commonly known as: LANTUS Inject 0.25 mLs (25 Units total) into the skin daily.    levocetirizine 5 MG tablet Commonly known as: XYZAL Take 5 mg by mouth daily.    metFORMIN 1000 MG tablet Commonly known as: GLUCOPHAGE Take 1 tablet (1,000 mg total) by mouth 2 (two) times daily with a meal.    mupirocin ointment 2 % Commonly known as: BACTROBAN Apply 1 Application topically See admin instructions. Apply 1 application 2-3 times a day    nicotine polacrilex 2 MG gum Commonly known as: NICORETTE Take 2 mg by mouth as needed for smoking cessation.    NovoLOG FlexPen 100 UNIT/ML FlexPen Generic drug: insulin aspart Inject 11-15 Units into the skin 3 (three) times daily with meals. 150-200=11units,201-250=12units, 251-300=13units, 301-350=14units, 351-400=15units    omeprazole 40 MG capsule Commonly known as: PRILOSEC Take 1 capsule (40 mg total) by mouth daily.    ondansetron 4 MG tablet Commonly known as: Zofran Take 1 tablet (4 mg total) by mouth every 8 (eight) hours as needed for nausea or vomiting.    Ozempic (0.25 or 0.5 MG/DOSE) 2 MG/1.5ML Sopn Generic drug: Semaglutide(0.25 or 0.5MG /DOS) 0.25 mg by Subconjunctival route every Friday. Start taking on: Jan 06, 2023    polyethylene glycol  17 g packet Commonly known as: MIRALAX / GLYCOLAX Take 17 g by mouth daily.    potassium chloride 10 MEQ tablet Commonly known as: KLOR-CON Take 1 tablet (10 mEq total) by mouth daily. Take While taking Diamox/acetazolamide    QUEtiapine 400 MG 24 hr  tablet Commonly known as: SEROQUEL XR Take 2 tablets (800 mg total) by mouth at bedtime.    Refresh Tears 0.5 % Soln Generic drug: carboxymethylcellulose Place 1 drop into both eyes 2 (two) times daily.    risperiDONE 3 MG disintegrating tablet Commonly known as: RISPERDAL M-TABS Take 1 tablet (3 mg total) by mouth every morning. What changed: when to take this    sennosides-docusate sodium 8.6-50 MG tablet Commonly known as: SENOKOT-S Take 2 tablets by mouth in the morning and at bedtime. What changed: how much to take    Systane Ultra PF 0.4-0.3 % Soln Generic drug: Polyethyl Glyc-Propyl Glyc PF Apply 2 drops to eye See admin instructions. Bid to tid depending upon dryness of eyes    tiotropium 18 MCG inhalation capsule Commonly known as: SPIRIVA Place 1 capsule (18 mcg total) into inhaler and inhale daily.    traMADol 50 MG tablet Commonly known as: ULTRAM Take 1 tablet (50 mg total) by mouth every 8 (eight) hours as needed for severe pain or moderate pain. What changed:  when to take this reasons to take this    Vitamin D (Ergocalciferol) 1.25 MG (50000 UNIT) Caps capsule Commonly known as: DRISDOL Take 50,000 Units by mouth every Saturday.           Relevant Imaging Results:  Relevant Lab Results:   Additional Information    Elliot Gault, LCSW

## 2023-01-04 NOTE — TOC Transition Note (Signed)
Transition of Care [facility]) - CM/SW Discharge Note   Patient Details  Name: Regina Watson MRN: [id number] Date of Birth: Apr 24, 1966  Transition of Care [facility]) CM/SW Contact:  Elliot Gault, LCSW Phone Number: 01/04/2023, 2:04 PM   Clinical Narrative:     Pt medically stable for dc per MD. Updated pt's guardian who is in agreement with dc back to [facility].  Updated [facility] manager, Cleopatra Cedar, who states that they will transport pt.  DC clinical sent electronically. No other TOC needs for dc.  Final next level of care: Other (comment) [facility]) Barriers to Discharge: Barriers Resolved   Patient Goals and CMS Choice      Discharge Placement                         Discharge Plan and Services Additional resources added to the After Visit Summary for   In-house Referral: Clinical Social Work                                   Social Determinants of Health (SDOH) Interventions SDOH Screenings   Housing: Patient Unable To Answer (01/01/2023)  Tobacco Use: High Risk (01/01/2023)     Readmission Risk Interventions    10/16/2020    1:07 PM  Readmission Risk Prevention Plan  Transportation Screening Complete  Home Care Screening Complete  Medication Review (RN CM) Complete

## 2023-01-04 NOTE — Progress Notes (Signed)
Date and time results received: 01/04/23 1717 (use smartphrase ".now" to insert current time)  Test: Blood Culture Critical Value: Anaerobic bottle - gram negative rods  Name of Provider Notified: Courage Emokpae  Orders Received? Or Actions Taken?: Awaiting response

## 2023-01-04 NOTE — Plan of Care (Signed)

## 2023-01-05 LAB — CULTURE, BLOOD (ROUTINE X 2)

## 2023-01-06 ENCOUNTER — Other Ambulatory Visit: Payer: Self-pay | Admitting: Family Medicine

## 2023-01-06 LAB — CULTURE, BLOOD (ROUTINE X 2)
Special Requests: ADEQUATE
Special Requests: ADEQUATE

## 2023-01-06 MED ORDER — CIPROFLOXACIN HCL 750 MG PO TABS: 750.0000 mg | ORAL_TABLET | Freq: Two times a day (BID) | ORAL | 0 refills | Status: AC

## 2023-01-06 NOTE — Progress Notes (Signed)
Positive blood cultures--- Cipro increased from 500 to  750 mg twice daily--- for additional 5 days -Patient had received IV Rocephin in the hospital -Patient also received Cipro 500 twice daily for the last couple days Shon Hale, MD

## 2023-01-09 ENCOUNTER — Ambulatory Visit (INDEPENDENT_AMBULATORY_CARE_PROVIDER_SITE_OTHER): Payer: Medicaid Other | Admitting: Podiatry

## 2023-01-09 ENCOUNTER — Encounter: Payer: Self-pay | Admitting: Podiatry

## 2023-01-09 DIAGNOSIS — M79675 Pain in left toe(s): Secondary | ICD-10-CM

## 2023-01-09 DIAGNOSIS — M79674 Pain in right toe(s): Secondary | ICD-10-CM

## 2023-01-09 DIAGNOSIS — B351 Tinea unguium: Secondary | ICD-10-CM

## 2023-01-09 DIAGNOSIS — E119 Type 2 diabetes mellitus without complications: Secondary | ICD-10-CM

## 2023-01-09 DIAGNOSIS — L84 Corns and callosities: Secondary | ICD-10-CM

## 2023-01-09 NOTE — Progress Notes (Signed)
This patient returns to my office for at risk foot care.  This patient requires this care by a professional since this patient will be at risk due to having diabetic neuropathy and amputation second toe left foot. This patient is unable to cut nails herself since the patient cannot reach her nails.These nails are painful walking and wearing shoes.  This patient presents for at risk foot care today.  General Appearance  Alert, conversant and in no acute stress.  Vascular  Dorsalis pedis and posterior tibial  pulses are palpable  bilaterally.  Capillary return is within normal limits  bilaterally. Temperature is within normal limits  bilaterally.  Neurologic  Senn-Weinstein monofilament wire test within normal limits  bilaterally. Muscle power within normal limits bilaterally.  Nails Thick disfigured discolored nails with subungual debris  from hallux to fifth toes bilaterally. No evidence of bacterial infection or drainage bilaterally.  Orthopedic  No limitations of motion  feet .  No crepitus or effusions noted.  No bony pathology or digital deformities noted.  Amputation second toe left foot.  Skin  normotropic skin with no porokeratosis noted bilaterally.  No signs of infections or ulcers noted.   Symptomatic callus left foot.  Onychomycosis  Pain in right toes  Pain in left toes  Consent was obtained for treatment procedures.   Mechanical debridement of nails 1-5  bilaterally performed with a nail nipper.  Filed with dremel without incident.    Return office visit   3 months                   Told patient to return for periodic foot care and evaluation due to potential at risk complications. Callus done as a courtesy.   Bernadean Saling DPM   

## 2023-01-12 ENCOUNTER — Ambulatory Visit: Payer: Medicaid Other | Admitting: Podiatry

## 2023-01-20 ENCOUNTER — Ambulatory Visit: Payer: Medicaid Other | Admitting: Podiatry

## 2023-04-14 ENCOUNTER — Encounter: Payer: Self-pay | Admitting: Podiatry

## 2023-04-14 ENCOUNTER — Ambulatory Visit (INDEPENDENT_AMBULATORY_CARE_PROVIDER_SITE_OTHER): Payer: Medicaid Other | Admitting: Podiatry

## 2023-04-14 DIAGNOSIS — M79674 Pain in right toe(s): Secondary | ICD-10-CM

## 2023-04-14 DIAGNOSIS — M79675 Pain in left toe(s): Secondary | ICD-10-CM

## 2023-04-14 DIAGNOSIS — E119 Type 2 diabetes mellitus without complications: Secondary | ICD-10-CM

## 2023-04-14 DIAGNOSIS — B351 Tinea unguium: Secondary | ICD-10-CM

## 2023-04-14 NOTE — Progress Notes (Signed)
This patient returns to my office for at risk foot care.  This patient requires this care by a professional since this patient will be at risk due to having diabetic neuropathy and amputation second toe left foot. This patient is unable to cut nails herself since the patient cannot reach her nails.These nails are painful walking and wearing shoes.  This patient presents for at risk foot care today.  General Appearance  Alert, conversant and in no acute stress.  Vascular  Dorsalis pedis and posterior tibial  pulses are palpable  bilaterally.  Capillary return is within normal limits  bilaterally. Temperature is within normal limits  bilaterally.  Neurologic  Senn-Weinstein monofilament wire test within normal limits  bilaterally. Muscle power within normal limits bilaterally.  Nails Thick disfigured discolored nails with subungual debris  from hallux to fifth toes bilaterally. No evidence of bacterial infection or drainage bilaterally.  Orthopedic  No limitations of motion  feet .  No crepitus or effusions noted.  No bony pathology or digital deformities noted.  Amputation second toe left foot.  No motion 1st MPJ left foot.  Skin  normotropic skin with no porokeratosis noted bilaterally.  No signs of infections or ulcers noted.   Symptomatic callus left foot under tibial sesamoid.  Onychomycosis  Pain in right toes  Pain in left toes  Consent was obtained for treatment procedures.   Mechanical debridement of nails 1-5  bilaterally performed with a nail nipper.  Filed with dremel without incident.    Return office visit   3 months                   Told patient to return for periodic foot care and evaluation due to potential at risk complications. Callus done as a courtesy.   Helane Gunther DPM

## 2023-05-05 ENCOUNTER — Other Ambulatory Visit (INDEPENDENT_AMBULATORY_CARE_PROVIDER_SITE_OTHER): Payer: Self-pay | Admitting: Gastroenterology

## 2023-06-02 ENCOUNTER — Other Ambulatory Visit (INDEPENDENT_AMBULATORY_CARE_PROVIDER_SITE_OTHER): Payer: Self-pay | Admitting: Gastroenterology

## 2023-07-12 ENCOUNTER — Other Ambulatory Visit (HOSPITAL_COMMUNITY): Payer: Self-pay

## 2023-07-26 ENCOUNTER — Encounter: Payer: Self-pay | Admitting: Podiatry

## 2023-07-26 ENCOUNTER — Ambulatory Visit (INDEPENDENT_AMBULATORY_CARE_PROVIDER_SITE_OTHER): Payer: MEDICAID | Admitting: Podiatry

## 2023-07-26 DIAGNOSIS — E119 Type 2 diabetes mellitus without complications: Secondary | ICD-10-CM

## 2023-07-26 DIAGNOSIS — M79675 Pain in left toe(s): Secondary | ICD-10-CM

## 2023-07-26 DIAGNOSIS — M79674 Pain in right toe(s): Secondary | ICD-10-CM

## 2023-07-26 DIAGNOSIS — L84 Corns and callosities: Secondary | ICD-10-CM

## 2023-07-26 DIAGNOSIS — B351 Tinea unguium: Secondary | ICD-10-CM | POA: Diagnosis not present

## 2023-07-26 NOTE — Progress Notes (Signed)
This patient returns to my office for at risk foot care.  This patient requires this care by a professional since this patient will be at risk due to having diabetic neuropathy and amputation second toe left foot. This patient is unable to cut nails herself since the patient cannot reach her nails.These nails are painful walking and wearing shoes.  This patient presents for at risk foot care today.  General Appearance  Alert, conversant and in no acute stress.  Vascular  Dorsalis pedis and posterior tibial  pulses are palpable  bilaterally.  Capillary return is within normal limits  bilaterally. Temperature is within normal limits  bilaterally.  Neurologic  Senn-Weinstein monofilament wire test within normal limits  bilaterally. Muscle power within normal limits bilaterally.  Nails Thick disfigured discolored nails with subungual debris  from hallux to fifth toes bilaterally. No evidence of bacterial infection or drainage bilaterally.  Orthopedic  No limitations of motion  feet .  No crepitus or effusions noted.  No bony pathology or digital deformities noted.  Amputation second toe left foot.  No motion 1st MPJ left foot.  Skin  normotropic skin with no porokeratosis noted bilaterally.  No signs of infections or ulcers noted.   Symptomatic callus left foot under tibial sesamoid.  Onychomycosis  Pain in right toes  Pain in left toes  Consent was obtained for treatment procedures.   Mechanical debridement of nails 1-5  bilaterally performed with a nail nipper.  Filed with dremel without incident.    Return office visit   3 months                   Told patient to return for periodic foot care and evaluation due to potential at risk complications. Callus done as a courtesy.   Helane Gunther DPM

## 2023-08-06 ENCOUNTER — Other Ambulatory Visit: Payer: Self-pay

## 2023-08-06 ENCOUNTER — Encounter (HOSPITAL_COMMUNITY): Payer: Self-pay

## 2023-08-06 ENCOUNTER — Emergency Department (HOSPITAL_COMMUNITY): Payer: MEDICAID

## 2023-08-06 ENCOUNTER — Emergency Department (HOSPITAL_COMMUNITY)
Admission: EM | Admit: 2023-08-06 | Discharge: 2023-08-07 | Disposition: A | Discharge status: Nursing Home (Includes ICF) | Payer: MEDICAID | Attending: Emergency Medicine | Admitting: Emergency Medicine

## 2023-08-06 DIAGNOSIS — M25562 Pain in left knee: Secondary | ICD-10-CM | POA: Diagnosis present

## 2023-08-06 DIAGNOSIS — S8002XA Contusion of left knee, initial encounter: Secondary | ICD-10-CM | POA: Insufficient documentation

## 2023-08-06 DIAGNOSIS — W010XXA Fall on same level from slipping, tripping and stumbling without subsequent striking against object, initial encounter: Secondary | ICD-10-CM | POA: Diagnosis not present

## 2023-08-06 DIAGNOSIS — W19XXXA Unspecified fall, initial encounter: Secondary | ICD-10-CM

## 2023-08-06 DIAGNOSIS — Z794 Long term (current) use of insulin: Secondary | ICD-10-CM | POA: Diagnosis not present

## 2023-08-06 DIAGNOSIS — R000 Tachycardia, unspecified: Secondary | ICD-10-CM | POA: Insufficient documentation

## 2023-08-06 DIAGNOSIS — Z7982 Long term (current) use of aspirin: Secondary | ICD-10-CM | POA: Insufficient documentation

## 2023-08-06 LAB — CBG MONITORING, ED: Glucose-Capillary: 177 mg/dL — ABNORMAL HIGH (ref 70–99)

## 2023-08-06 NOTE — ED Notes (Signed)
Pt ambulated 30 ft without assistance, tolerated well

## 2023-08-06 NOTE — ED Provider Notes (Signed)
[place] EMERGENCY DEPARTMENT AT [facility] Provider Note   CSN: [id number] Arrival date & time: 08/06/23  1110     History  Chief Complaint  Patient presents with   Marletta Lor    Regina Watson is a 57 y.o. female.  She is brought in by her facility by ambulance after she tripped and fell today.  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  No loss of consciousness.  Complaining of left knee pain.  Denies other significant complaints.  She is not on any blood thinners.  The history is provided by the patient and the EMS personnel.  Fall This is a new problem. The problem has not changed since onset.Pertinent negatives include no chest pain, no abdominal pain, no headaches and no shortness of breath. Nothing aggravates the symptoms. Nothing relieves the symptoms. She has tried nothing for the symptoms. The treatment provided no relief.       Home Medications Prior to Admission medications   Medication Sig Start Date End Date Taking? Authorizing Provider  acetaminophen (TYLENOL) 325 MG tablet Take 650 mg by mouth every 6 (six) hours as needed for mild pain or moderate pain.    [provider]  acetaZOLAMIDE ER (DIAMOX) 500 MG capsule Take 1 capsule (500 mg total) by mouth 2 (two) times daily. 01/04/23   Shon Hale, MD  albuterol (VENTOLIN HFA) 108 (90 Base) MCG/ACT inhaler Inhale 2 puffs into the lungs every 4 (four) hours as needed for wheezing or shortness of breath.    [provider]  amLODipine (NORVASC) 5 MG tablet Take 1 tablet (5 mg total) by mouth daily. 01/04/23   Shon Hale, MD  aspirin EC 81 MG tablet Take 1 tablet (81 mg total) by mouth daily with breakfast. 01/04/23 01/04/24  Shon Hale, MD  atorvastatin (LIPITOR) 10 MG tablet Take 1 tablet (10 mg total) by mouth every evening. 01/04/23   Shon Hale, MD  benztropine (COGENTIN) 0.5 MG tablet Take 0.5 mg by mouth  2 (two) times daily.    [provider]  colchicine 0.6 MG tablet Take 1 tablet (0.6 mg total) by mouth daily. 01/04/23   Shon Hale, MD  divalproex (DEPAKOTE ER) 500 MG 24 hr tablet Take 2 tablets (1,000 mg total) by mouth at bedtime. 01/04/23   Shon Hale, MD  furosemide (LASIX) 40 MG tablet Take 1 tablet (40 mg total) by mouth daily as needed for fluid or edema. If 3Lb weight gain 01/04/23   Shon Hale, MD  gabapentin (NEURONTIN) 400 MG capsule Take 1 capsule (400 mg total) by mouth 3 (three) times daily. 01/04/23   Shon Hale, MD  hydrOXYzine (ATARAX) 25 MG tablet Take 25 mg by mouth 3 (three) times daily.    [provider]  insulin aspart (NOVOLOG FLEXPEN) 100 UNIT/ML FlexPen Inject 11-15 Units into the skin 3 (three) times daily with meals. 150-200=11units,201-250=12units, 251-300=13units, 301-350=14units, 351-400=15units    [provider]  insulin glargine (LANTUS) 100 UNIT/ML injection Inject 0.25 mLs (25 Units total) into the skin at bedtime. 01/04/23   Shon Hale, MD  levocetirizine (XYZAL) 5 MG tablet Take 5 mg by mouth daily.    [provider]  metFORMIN (GLUCOPHAGE) 1000 MG tablet Take 1 tablet (1,000 mg total) by mouth 2 (two) times daily with a meal. 01/04/23   Emokpae, Courage, MD  nicotine polacrilex (NICORETTE) 2 MG gum Take 2 mg by mouth as needed  for smoking cessation. 12/28/22   [provider]  omeprazole (PRILOSEC) 40 MG capsule TAKE ONE CAPSULE BY MOUTH ONCE DAILY. NEEDS OFFICE VISIT 05/05/23   Marguerita Merles, Reuel Boom, MD  polyethylene glycol (MIRALAX / GLYCOLAX) 17 g packet Take 17 g by mouth daily.    [provider]  potassium chloride (KLOR-CON) 10 MEQ tablet Take 1 tablet (10 mEq total) by mouth daily. Take While taking Diamox/acetazolamide 01/04/23   Shon Hale, MD  QUEtiapine (SEROQUEL XR) 400 MG 24 hr tablet Take 2 tablets (800 mg total) by mouth at bedtime. 01/04/23   Emokpae, Courage,  MD  REFRESH TEARS 0.5 % SOLN Place 1 drop into both eyes 2 (two) times daily. 12/14/22   [provider]  risperiDONE (RISPERDAL M-TABS) 3 MG disintegrating tablet Take 1 tablet (3 mg total) by mouth every morning. 01/04/23   Shon Hale, MD  Semaglutide,0.25 or 0.5MG /DOS, (OZEMPIC, 0.25 OR 0.5 MG/DOSE,) 2 MG/1.5ML SOPN Inject 0.25 mg into the skin every Friday. 01/06/23   Shon Hale, MD  sennosides-docusate sodium (SENOKOT-S) 8.6-50 MG tablet Take 2 tablets by mouth at bedtime. 01/04/23   Emokpae, Courage, MD  SYSTANE ULTRA PF 0.4-0.3 % SOLN Apply 2 drops to eye See admin instructions. Bid to tid depending upon dryness of eyes 12/21/22   [provider]  tamoxifen (NOLVADEX) 20 MG tablet Take 1 tablet (20 mg total) by mouth daily. 01/04/23   Shon Hale, MD  tiotropium (SPIRIVA) 18 MCG inhalation capsule Place 1 capsule (18 mcg total) into inhaler and inhale daily. 01/04/23   Shon Hale, MD  traMADol (ULTRAM) 50 MG tablet Take 1 tablet (50 mg total) by mouth every 8 (eight) hours as needed for severe pain or moderate pain. 01/04/23   Shon Hale, MD  Vitamin D, Ergocalciferol, (DRISDOL) 1.25 MG (50000 UNIT) CAPS capsule Take 50,000 Units by mouth every Saturday.    [provider]      Allergies    Ziprasidone hcl, Penicillinase, Sulfa antibiotics, Sulfamethoxazole-trimethoprim, Penicillins, and Latex    Review of Systems   Review of Systems  Eyes:  Positive for visual disturbance.  Respiratory:  Negative for shortness of breath.   Cardiovascular:  Negative for chest pain.  Gastrointestinal:  Negative for abdominal pain.  Musculoskeletal:  Negative for back pain and neck pain.  Skin:  Negative for wound.  Neurological:  Negative for headaches.    Physical Exam Updated Vital Signs BP (!) 146/88 (BP Location: Left Arm)   Pulse (!) 101   Temp 98.3 F (36.8 C) (Oral)   Resp 18   Ht 5\' 9"  (1.753 m)   Wt 90.7 kg   SpO2 98%   BMI 29.53 kg/m   Physical Exam Vitals and nursing note reviewed.  Constitutional:      General: She is not in acute distress.    Appearance: Normal appearance. She is well-developed.  HENT:     Head: Normocephalic and atraumatic.  Eyes:     Conjunctiva/sclera: Conjunctivae normal.  Cardiovascular:     Rate and Rhythm: Regular rhythm. Tachycardia present.     Heart sounds: No murmur heard. Pulmonary:     Effort: Pulmonary effort is normal. No respiratory distress.     Breath sounds: Normal breath sounds.  Abdominal:     Palpations: Abdomen is soft.     Tenderness: There is no abdominal tenderness. There is no guarding or rebound.  Musculoskeletal:        General: No swelling. Normal range of motion.  Cervical back: Neck supple. No tenderness.     Right lower leg: Edema present.     Left lower leg: Edema present.     Comments: She has full range of motion of upper and lower extremities without any significant pain or tenderness.  Skin:    General: Skin is warm and dry.     Capillary Refill: Capillary refill takes less than 2 seconds.  Neurological:     General: No focal deficit present.     Mental Status: She is alert.     Sensory: No sensory deficit.     Motor: No weakness.     ED Results / Procedures / Treatments   Labs (all labs ordered are listed, but only abnormal results are displayed) Labs Reviewed - No data to display  EKG None  Radiology DG Knee Complete 4 Views Left Result Date: 08/06/2023 CLINICAL DATA:  Fall, pain. EXAM: LEFT KNEE - COMPLETE 4 VIEW COMPARISON:  None Available. FINDINGS: No evidence of fracture, dislocation, or joint effusion. Mild tricompartmental degenerative changes with small osteophytes. Soft tissues are unremarkable. IMPRESSION: Mild degenerative changes. No acute osseous abnormalities. Electronically Signed   By: Layla Maw M.D.   On: 08/06/2023 12:20    Procedures Procedures    Medications Ordered in ED Medications - No data to  display  ED Course/ Medical Decision Making/ A&P Clinical Course as of 08/06/23 1700  Sun Aug 06, 2023  1249 Patient was placed in an Ace wrap and she ambulated in the department without any difficulty.  Will return back to her facility. [MB]    Clinical Course User Index [MB] Terrilee Files, MD                                 Medical Decision Making Amount and/or Complexity of Data Reviewed Radiology: ordered.   This patient complains of fall left knee pain; this involves an extensive number of treatment Options and is a complaint that carries with it a high risk of complications and morbidity. The differential includes contusion, fracture, sprain, dislocation I ordered imaging studies which included x-rays of left knee and I independently    visualized and interpreted imaging which showed no acute findings Previous records obtained and reviewed in epic no recent admissions Social determinants considered, tobacco use Critical Interventions: None  After the interventions stated above, I reevaluated the patient and found patient to be able to ambulate in the department without any difficulty Admission and further testing considered, no indications for admission or further workup at this time.  Will transfer her back to her facility where they can continue to monitor her symptoms.         Final Clinical Impression(s) / ED Diagnoses Final diagnoses:  Fall, initial encounter  Contusion of left knee, initial encounter    Rx / DC Orders ED Discharge Orders     None         Terrilee Files, MD 08/06/23 1702

## 2023-08-06 NOTE — ED Notes (Signed)
Sandwich and drink given to pt 

## 2023-08-06 NOTE — ED Triage Notes (Signed)
Pt arrived [facility] from [facility] in [place]. Pt was waking and fell over a w/c and landed straight on her face. Pt only c/o left knee pain. Pt is legally blind.

## 2023-08-06 NOTE — ED Notes (Signed)
Pt continues to await transport.

## 2023-08-07 NOTE — ED Notes (Signed)
Pt ambulated to restroom with assistance.  

## 2023-10-15 ENCOUNTER — Emergency Department (HOSPITAL_COMMUNITY)
Admission: EM | Admit: 2023-10-15 | Discharge: 2023-10-15 | Disposition: A | Discharge status: Home / Self Care | Payer: MEDICAID | Attending: Emergency Medicine | Admitting: Emergency Medicine

## 2023-10-15 ENCOUNTER — Encounter (HOSPITAL_COMMUNITY): Payer: Self-pay

## 2023-10-15 ENCOUNTER — Emergency Department (HOSPITAL_COMMUNITY): Payer: MEDICAID

## 2023-10-15 ENCOUNTER — Other Ambulatory Visit: Payer: Self-pay

## 2023-10-15 DIAGNOSIS — R739 Hyperglycemia, unspecified: Secondary | ICD-10-CM

## 2023-10-15 DIAGNOSIS — Z7982 Long term (current) use of aspirin: Secondary | ICD-10-CM | POA: Diagnosis not present

## 2023-10-15 DIAGNOSIS — Z853 Personal history of malignant neoplasm of breast: Secondary | ICD-10-CM | POA: Diagnosis not present

## 2023-10-15 DIAGNOSIS — Z7984 Long term (current) use of oral hypoglycemic drugs: Secondary | ICD-10-CM | POA: Diagnosis not present

## 2023-10-15 DIAGNOSIS — I10 Essential (primary) hypertension: Secondary | ICD-10-CM | POA: Diagnosis not present

## 2023-10-15 DIAGNOSIS — N3001 Acute cystitis with hematuria: Secondary | ICD-10-CM | POA: Insufficient documentation

## 2023-10-15 DIAGNOSIS — E1165 Type 2 diabetes mellitus with hyperglycemia: Secondary | ICD-10-CM | POA: Diagnosis not present

## 2023-10-15 DIAGNOSIS — Z794 Long term (current) use of insulin: Secondary | ICD-10-CM | POA: Diagnosis not present

## 2023-10-15 DIAGNOSIS — Z91040 Latex allergy status: Secondary | ICD-10-CM | POA: Insufficient documentation

## 2023-10-15 DIAGNOSIS — Z79899 Other long term (current) drug therapy: Secondary | ICD-10-CM | POA: Insufficient documentation

## 2023-10-15 HISTORY — DX: Unspecified visual loss: H54.7

## 2023-10-15 LAB — COMPREHENSIVE METABOLIC PANEL
ALT: 17 U/L (ref 0–44)
AST: 14 U/L — ABNORMAL LOW (ref 15–41)
Albumin: 3.9 g/dL (ref 3.5–5.0)
Alkaline Phosphatase: 120 U/L (ref 38–126)
Anion gap: 10 (ref 5–15)
BUN: 16 mg/dL (ref 6–20)
CO2: 21 mmol/L — ABNORMAL LOW (ref 22–32)
Calcium: 9.7 mg/dL (ref 8.9–10.3)
Chloride: 104 mmol/L (ref 98–111)
Creatinine, Ser: 0.76 mg/dL (ref 0.44–1.00)
GFR, Estimated: >60 mL/min (ref >60)
Glucose, Bld: 356 mg/dL — ABNORMAL HIGH (ref 70–99)
Potassium: 3.6 mmol/L (ref 3.5–5.1)
Sodium: 135 mmol/L (ref 135–145)
Total Bilirubin: 0.4 mg/dL (ref 0.0–1.2)
Total Protein: 8.6 g/dL — ABNORMAL HIGH (ref 6.5–8.1)

## 2023-10-15 LAB — URINALYSIS, W/ REFLEX TO CULTURE (INFECTION SUSPECTED)
Bacteria, UA: NONE SEEN
Bilirubin Urine: NEGATIVE
Glucose, UA: >=500 mg/dL — AB
Ketones, ur: NEGATIVE mg/dL
Leukocytes,Ua: NEGATIVE
Nitrite: NEGATIVE
Protein, ur: NEGATIVE mg/dL
Specific Gravity, Urine: 1.015 (ref 1.005–1.030)
pH: 5 (ref 5.0–8.0)

## 2023-10-15 LAB — CBC WITH DIFFERENTIAL/PLATELET
Abs Immature Granulocytes: 0.07 10*3/uL (ref 0.00–0.07)
Basophils Absolute: 0 10*3/uL (ref 0.0–0.1)
Basophils Relative: 1 %
Eosinophils Absolute: 0.2 10*3/uL (ref 0.0–0.5)
Eosinophils Relative: 4 %
HCT: 39.9 % (ref 36.0–46.0)
Hemoglobin: 12.5 g/dL (ref 12.0–15.0)
Immature Granulocytes: 1 %
Lymphocytes Relative: 17 %
Lymphs Abs: 1.1 10*3/uL (ref 0.7–4.0)
MCH: 29.3 pg (ref 26.0–34.0)
MCHC: 31.3 g/dL (ref 30.0–36.0)
MCV: 93.4 fL (ref 80.0–100.0)
Monocytes Absolute: 0.6 10*3/uL (ref 0.1–1.0)
Monocytes Relative: 10 %
Neutro Abs: 4.4 10*3/uL (ref 1.7–7.7)
Neutrophils Relative %: 67 %
Platelets: 283 10*3/uL (ref 150–400)
RBC: 4.27 MIL/uL (ref 3.87–5.11)
RDW: 13.6 % (ref 11.5–15.5)
WBC: 6.4 10*3/uL (ref 4.0–10.5)
nRBC: 0 % (ref 0.0–0.2)

## 2023-10-15 LAB — CBG MONITORING, ED
Glucose-Capillary: 321 mg/dL — ABNORMAL HIGH (ref 70–99)
Glucose-Capillary: 244 mg/dL — ABNORMAL HIGH (ref 70–99)

## 2023-10-15 LAB — RESP PANEL BY RT-PCR (RSV, FLU A&B, COVID)  RVPGX2
Influenza A by PCR: NEGATIVE
Influenza B by PCR: NEGATIVE
Resp Syncytial Virus by PCR: NEGATIVE
SARS Coronavirus 2 by RT PCR: NEGATIVE

## 2023-10-15 LAB — VALPROIC ACID LEVEL: Valproic Acid Lvl: 55 ug/mL (ref 50.0–100.0)

## 2023-10-15 LAB — MAGNESIUM: Magnesium: 2 mg/dL (ref 1.7–2.4)

## 2023-10-15 LAB — BRAIN NATRIURETIC PEPTIDE: B Natriuretic Peptide: 20 pg/mL (ref 0.0–100.0)

## 2023-10-15 MED ORDER — CEPHALEXIN 500 MG PO CAPS
500.0000 mg | ORAL_CAPSULE | Freq: Once | ORAL | Status: AC
  Administered 2023-10-15: 500 mg via ORAL
  Filled 2023-10-15: qty 1

## 2023-10-15 MED ORDER — INSULIN ASPART 100 UNIT/ML IJ SOLN
6.0000 [IU] | Freq: Once | INTRAMUSCULAR | Status: AC
  Administered 2023-10-15: 6 [IU] via SUBCUTANEOUS
  Filled 2023-10-15: qty 1

## 2023-10-15 MED ORDER — CEPHALEXIN 500 MG PO CAPS: 500.0000 mg | ORAL_CAPSULE | Freq: Four times a day (QID) | ORAL | 0 refills | Status: DC

## 2023-10-15 NOTE — ED Notes (Signed)
 Attempted to call legal guardian but just received VM

## 2023-10-15 NOTE — ED Triage Notes (Signed)
 [facility] reports pt coming from [facility] in [place]. Staff states pt blood sugar has been running high, diarrhea and bilateral leg swelling.

## 2023-10-15 NOTE — Discharge Instructions (Addendum)
 Talk to your doctor about adjusting your medicine for the extra fluid you have on you. Follow up with your md this week

## 2023-10-15 NOTE — ED Notes (Signed)
 EMS IV is infiltrated.2 nurses attempted [place] IV and was not successful. EDP aware.

## 2023-10-15 NOTE — ED Provider Notes (Signed)
 [place] EMERGENCY DEPARTMENT AT [facility] Provider Note   CSN: [id number] Arrival date & time: 10/15/23  1226     History  Chief Complaint  Patient presents with   Hyperglycemia    Regina Watson is a 58 y.o. female.   Hyperglycemia Patient sent in from assisted living in [place].  Reportedly has high blood sugar.  Patient states has been up to 500.  She is diabetic.  Also reportedly had some diarrhea and some swelling in her legs.  Patient states he feels little bad.  Has been around some people with diarrhea at the as the bloating.    Past Medical History:  Diagnosis Date   Anemia    Blind    Breast cancer (HCC)    Diabetes mellitus without complication (HCC)    Hypertension    MDD (major depressive disorder)    Neuropathy 2010   Schizophrenia (HCC)    Past Surgical History:  Procedure Laterality Date   BIOPSY  04/12/2022   Procedure: BIOPSY;  Surgeon: Dolores Frame, MD;  [facility];  Service: Gastroenterology;;   COLONOSCOPY WITH PROPOFOL N/A 04/12/2022   Procedure: COLONOSCOPY WITH PROPOFOL;  Surgeon: Dolores Frame, MD;  Location: [facility];  Service: Gastroenterology;  Laterality: N/A;  1230 ASA 3 pt at [facility]   ESOPHAGOGASTRODUODENOSCOPY (EGD) WITH PROPOFOL N/A 04/12/2022   Procedure: ESOPHAGOGASTRODUODENOSCOPY (EGD) WITH PROPOFOL;  Surgeon: Dolores Frame, MD;  Location: [facility];  Service: Gastroenterology;  Laterality: N/A;   INCISION AND DRAINAGE ABSCESS Left 05/08/2022   Procedure: INCISION AND DRAINAGE ABSCESS of left breast abscess;  Surgeon: Lewie Chamber, DO;  Location: [facility];  Service: General;  Laterality: Left;   TOE AMPUTATION      Home Medications Prior to Admission medications   Medication Sig Start Date End Date Taking? Authorizing Provider  acetaminophen (TYLENOL) 325 MG tablet Take 650 mg by mouth every 6 (six) hours as needed for mild pain  or moderate pain.    [provider]  acetaZOLAMIDE ER (DIAMOX) 500 MG capsule Take 1 capsule (500 mg total) by mouth 2 (two) times daily. 01/04/23   Shon Hale, MD  albuterol (VENTOLIN HFA) 108 (90 Base) MCG/ACT inhaler Inhale 2 puffs into the lungs every 4 (four) hours as needed for wheezing or shortness of breath.    [provider]  amLODipine (NORVASC) 5 MG tablet Take 1 tablet (5 mg total) by mouth daily. 01/04/23   Shon Hale, MD  aspirin EC 81 MG tablet Take 1 tablet (81 mg total) by mouth daily with breakfast. 01/04/23 01/04/24  Shon Hale, MD  atorvastatin (LIPITOR) 10 MG tablet Take 1 tablet (10 mg total) by mouth every evening. 01/04/23   Shon Hale, MD  benztropine (COGENTIN) 0.5 MG tablet Take 0.5 mg by mouth 2 (two) times daily.    [provider]  colchicine 0.6 MG tablet Take 1 tablet (0.6 mg total) by mouth daily. 01/04/23   Shon Hale, MD  divalproex (DEPAKOTE ER) 500 MG 24 hr tablet Take 2 tablets (1,000 mg total) by mouth at bedtime. 01/04/23   Shon Hale, MD  furosemide (LASIX) 20 MG tablet Take 20 mg by mouth daily.    [provider]  gabapentin (NEURONTIN) 400 MG capsule Take 1 capsule (400 mg total) by mouth 3 (three) times daily. 01/04/23   Shon Hale, MD  hydrOXYzine (ATARAX) 25 MG tablet Take 25 mg by mouth 3 (three) times daily.  [provider]  insulin aspart (NOVOLOG FLEXPEN) 100 UNIT/ML FlexPen Inject 11-15 Units into the skin 3 (three) times daily with meals. 150-200=11units,201-250=12units, 251-300=13units, 301-350=14units, 351-400=15units    [provider]  insulin glargine (LANTUS) 100 UNIT/ML injection Inject 0.25 mLs (25 Units total) into the skin at bedtime. 01/04/23   Shon Hale, MD  levocetirizine (XYZAL) 5 MG tablet Take 5 mg by mouth daily.    [provider]  metFORMIN (GLUCOPHAGE) 1000 MG tablet Take 1 tablet (1,000 mg total) by mouth 2 (two) times  daily with a meal. 01/04/23   Emokpae, Courage, MD  nicotine polacrilex (NICORETTE) 2 MG gum Take 2 mg by mouth as needed for smoking cessation. 12/28/22   [provider]  omeprazole (PRILOSEC) 40 MG capsule TAKE ONE CAPSULE BY MOUTH ONCE DAILY. NEEDS OFFICE VISIT 05/05/23   Marguerita Merles, Reuel Boom, MD  polyethylene glycol (MIRALAX / GLYCOLAX) 17 g packet Take 17 g by mouth daily.    [provider]  potassium chloride (KLOR-CON) 10 MEQ tablet Take 1 tablet (10 mEq total) by mouth daily. Take While taking Diamox/acetazolamide 01/04/23   Shon Hale, MD  QUEtiapine (SEROQUEL XR) 400 MG 24 hr tablet Take 2 tablets (800 mg total) by mouth at bedtime. 01/04/23   Emokpae, Courage, MD  REFRESH TEARS 0.5 % SOLN Place 1 drop into both eyes 2 (two) times daily. 12/14/22   [provider]  risperiDONE (RISPERDAL M-TABS) 3 MG disintegrating tablet Take 1 tablet (3 mg total) by mouth every morning. 01/04/23   Shon Hale, MD  Semaglutide,0.25 or 0.5MG /DOS, (OZEMPIC, 0.25 OR 0.5 MG/DOSE,) 2 MG/1.5ML SOPN Inject 0.25 mg into the skin every Friday. 01/06/23   Shon Hale, MD  sennosides-docusate sodium (SENOKOT-S) 8.6-50 MG tablet Take 2 tablets by mouth at bedtime. 01/04/23   Emokpae, Courage, MD  SYSTANE ULTRA PF 0.4-0.3 % SOLN Apply 2 drops to eye See admin instructions. Bid to tid depending upon dryness of eyes 12/21/22   [provider]  tamoxifen (NOLVADEX) 20 MG tablet Take 1 tablet (20 mg total) by mouth daily. 01/04/23   Shon Hale, MD  tiotropium (SPIRIVA) 18 MCG inhalation capsule Place 1 capsule (18 mcg total) into inhaler and inhale daily. 01/04/23   Shon Hale, MD  traMADol (ULTRAM) 50 MG tablet Take 1 tablet (50 mg total) by mouth every 8 (eight) hours as needed for severe pain or moderate pain. 01/04/23   Shon Hale, MD  Vitamin D, Ergocalciferol, (DRISDOL) 1.25 MG (50000 UNIT) CAPS capsule Take 50,000 Units by mouth every Saturday.    [provider]      Allergies    Ziprasidone hcl, Penicillinase, Sulfa antibiotics, Sulfamethoxazole-trimethoprim, Penicillins, and Latex    Review of Systems   Review of Systems  Physical Exam Updated Vital Signs BP 130/81   Pulse 93   Temp 97.6 F (36.4 C)   Resp 17   SpO2 98%  Physical Exam Vitals and nursing note reviewed.  HENT:     Head: Normocephalic.  Cardiovascular:     Rate and Rhythm: Regular rhythm.  Chest:     Chest wall: No tenderness.  Abdominal:     Tenderness: There is no abdominal tenderness.  Musculoskeletal:     Right lower leg: Edema present.     Left lower leg: Edema present.     Comments: Moderate pitting edema bilateral lower extremities with some chronic venous changes.  Skin:    General: Skin is warm.  Neurological:  Mental Status: She is alert and oriented to person, place, and time.     ED Results / Procedures / Treatments   Labs (all labs ordered are listed, but only abnormal results are displayed) Labs Reviewed  COMPREHENSIVE METABOLIC PANEL - Abnormal; Notable for the following components:      Result Value   CO2 21 (*)    Glucose, Bld 356 (*)    Total Protein 8.6 (*)    AST 14 (*)    All other components within normal limits  CBG MONITORING, ED - Abnormal; Notable for the following components:   Glucose-Capillary 321 (*)    All other components within normal limits  RESP PANEL BY RT-PCR (RSV, FLU A&B, COVID)  [id number]  CBC WITH DIFFERENTIAL/PLATELET  VALPROIC ACID LEVEL  BRAIN NATRIURETIC PEPTIDE  MAGNESIUM  URINALYSIS, W/ REFLEX TO CULTURE (INFECTION SUSPECTED)  CBG MONITORING, ED    EKG None  Radiology DG Chest Portable 1 View Result Date: 10/15/2023 CLINICAL DATA:  Shortness of breath EXAM: PORTABLE CHEST 1 VIEW COMPARISON:  Chest radiograph dated 01/01/2023 FINDINGS: Normal lung volumes. Patchy right midlung opacity. No pleural effusion or pneumothorax. The heart size and mediastinal contours are within normal  limits. No acute osseous abnormality. IMPRESSION: Patchy right midlung opacity, likely atelectasis. No focal consolidations. Electronically Signed   By: Agustin Cree M.D.   On: 10/15/2023 14:47    Procedures Procedures    Medications Ordered in ED Medications  insulin aspart (novoLOG) injection 6 Units (6 Units Subcutaneous Given 10/15/23 1442)    ED Course/ Medical Decision Making/ A&P                                 Medical Decision Making Amount and/or Complexity of Data Reviewed Labs: ordered. Radiology: ordered.  Risk Prescription drug management.  Patient was brought in for hyperglycemia.  Reportedly been feeling bad.  Sugars up to 500.  Reported some diarrhea.  Does have edema on legs.  Blood work reassuring.  Chest x-ray only shows atelectasis.  Glucose elevated at 300 without DKA.  Will give subQ insulin.  Reportedly has been having some urinary symptoms.  Will check urinalysis.  Lungs clear and does not appear to have extra fluid there.  Care turned over to Dr.Zammit         Final Clinical Impression(s) / ED Diagnoses Final diagnoses:  Hyperglycemia    Rx / DC Orders ED Discharge Orders     None         Benjiman Core, MD 10/15/23 [phone]

## 2023-10-15 NOTE — ED Notes (Signed)
 Bag lunch given to pt

## 2023-10-18 LAB — URINE CULTURE: Culture: >=100000 — AB

## 2023-11-13 ENCOUNTER — Ambulatory Visit: Payer: MEDICAID | Admitting: Podiatry

## 2023-11-23 ENCOUNTER — Ambulatory Visit: Payer: MEDICAID | Admitting: Podiatry

## 2023-12-04 ENCOUNTER — Encounter: Payer: Self-pay | Admitting: Podiatry

## 2023-12-04 ENCOUNTER — Ambulatory Visit (INDEPENDENT_AMBULATORY_CARE_PROVIDER_SITE_OTHER): Payer: MEDICAID | Admitting: Podiatry

## 2023-12-04 DIAGNOSIS — M79674 Pain in right toe(s): Secondary | ICD-10-CM

## 2023-12-04 DIAGNOSIS — E119 Type 2 diabetes mellitus without complications: Secondary | ICD-10-CM | POA: Diagnosis not present

## 2023-12-04 DIAGNOSIS — B351 Tinea unguium: Secondary | ICD-10-CM | POA: Diagnosis not present

## 2023-12-04 DIAGNOSIS — L84 Corns and callosities: Secondary | ICD-10-CM

## 2023-12-04 DIAGNOSIS — M79675 Pain in left toe(s): Secondary | ICD-10-CM | POA: Diagnosis not present

## 2023-12-04 NOTE — Progress Notes (Signed)
This patient returns to my office for at risk foot care.  This patient requires this care by a professional since this patient will be at risk due to having diabetic neuropathy and amputation second toe left foot. This patient is unable to cut nails herself since the patient cannot reach her nails.These nails are painful walking and wearing shoes.  This patient presents for at risk foot care today.  General Appearance  Alert, conversant and in no acute stress.  Vascular  Dorsalis pedis and posterior tibial  pulses are palpable  bilaterally.  Capillary return is within normal limits  bilaterally. Temperature is within normal limits  bilaterally.  Neurologic  Senn-Weinstein monofilament wire test within normal limits  bilaterally. Muscle power within normal limits bilaterally.  Nails Thick disfigured discolored nails with subungual debris  from hallux to fifth toes bilaterally. No evidence of bacterial infection or drainage bilaterally.  Orthopedic  No limitations of motion  feet .  No crepitus or effusions noted.  No bony pathology or digital deformities noted.  Amputation second toe left foot.  No motion 1st MPJ left foot.  Skin  normotropic skin with no porokeratosis noted bilaterally.  No signs of infections or ulcers noted.   Symptomatic callus left foot under tibial sesamoid.  Onychomycosis  Pain in right toes  Pain in left toes  Consent was obtained for treatment procedures.   Mechanical debridement of nails 1-5  bilaterally performed with a nail nipper.  Filed with dremel without incident.    Return office visit   3 months                   Told patient to return for periodic foot care and evaluation due to potential at risk complications. Callus done as a courtesy.   Helane Gunther DPM

## 2023-12-12 ENCOUNTER — Encounter (HOSPITAL_COMMUNITY): Payer: Self-pay

## 2023-12-12 ENCOUNTER — Other Ambulatory Visit: Payer: Self-pay

## 2023-12-12 ENCOUNTER — Emergency Department (HOSPITAL_COMMUNITY)
Admission: EM | Admit: 2023-12-12 | Discharge: 2023-12-13 | Disposition: A | Discharge status: Home / Self Care | Payer: MEDICAID | Attending: Emergency Medicine | Admitting: Emergency Medicine

## 2023-12-12 DIAGNOSIS — Z91040 Latex allergy status: Secondary | ICD-10-CM | POA: Diagnosis not present

## 2023-12-12 DIAGNOSIS — F209 Schizophrenia, unspecified: Secondary | ICD-10-CM | POA: Diagnosis present

## 2023-12-12 DIAGNOSIS — I10 Essential (primary) hypertension: Secondary | ICD-10-CM | POA: Diagnosis not present

## 2023-12-12 DIAGNOSIS — Z7982 Long term (current) use of aspirin: Secondary | ICD-10-CM | POA: Insufficient documentation

## 2023-12-12 DIAGNOSIS — Z794 Long term (current) use of insulin: Secondary | ICD-10-CM | POA: Insufficient documentation

## 2023-12-12 DIAGNOSIS — F419 Anxiety disorder, unspecified: Secondary | ICD-10-CM | POA: Insufficient documentation

## 2023-12-12 DIAGNOSIS — E119 Type 2 diabetes mellitus without complications: Secondary | ICD-10-CM | POA: Diagnosis not present

## 2023-12-12 DIAGNOSIS — F22 Delusional disorders: Secondary | ICD-10-CM | POA: Diagnosis not present

## 2023-12-12 DIAGNOSIS — Z853 Personal history of malignant neoplasm of breast: Secondary | ICD-10-CM | POA: Diagnosis not present

## 2023-12-12 DIAGNOSIS — R443 Hallucinations, unspecified: Secondary | ICD-10-CM

## 2023-12-12 DIAGNOSIS — Z79899 Other long term (current) drug therapy: Secondary | ICD-10-CM | POA: Insufficient documentation

## 2023-12-12 DIAGNOSIS — F2089 Other schizophrenia: Secondary | ICD-10-CM | POA: Diagnosis not present

## 2023-12-12 LAB — CBC WITH DIFFERENTIAL/PLATELET
Abs Immature Granulocytes: 0.06 10*3/uL (ref 0.00–0.07)
Basophils Absolute: 0 10*3/uL (ref 0.0–0.1)
Basophils Relative: 1 %
Eosinophils Absolute: 0.3 10*3/uL (ref 0.0–0.5)
Eosinophils Relative: 5 %
HCT: 40.6 % (ref 36.0–46.0)
Hemoglobin: 12.8 g/dL (ref 12.0–15.0)
Immature Granulocytes: 1 %
Lymphocytes Relative: 26 %
Lymphs Abs: 1.4 10*3/uL (ref 0.7–4.0)
MCH: 28.6 pg (ref 26.0–34.0)
MCHC: 31.5 g/dL (ref 30.0–36.0)
MCV: 90.6 fL (ref 80.0–100.0)
Monocytes Absolute: 0.5 10*3/uL (ref 0.1–1.0)
Monocytes Relative: 9 %
Neutro Abs: 3.1 10*3/uL (ref 1.7–7.7)
Neutrophils Relative %: 58 %
Platelets: 243 10*3/uL (ref 150–400)
RBC: 4.48 MIL/uL (ref 3.87–5.11)
RDW: 14.1 % (ref 11.5–15.5)
WBC: 5.4 10*3/uL (ref 4.0–10.5)
nRBC: 0 % (ref 0.0–0.2)

## 2023-12-12 LAB — COMPREHENSIVE METABOLIC PANEL WITH GFR
ALT: 19 U/L (ref 0–44)
AST: 15 U/L (ref 15–41)
Albumin: 3.7 g/dL (ref 3.5–5.0)
Alkaline Phosphatase: 125 U/L (ref 38–126)
Anion gap: 11 (ref 5–15)
BUN: 20 mg/dL (ref 6–20)
CO2: 20 mmol/L — ABNORMAL LOW (ref 22–32)
Calcium: 9.7 mg/dL (ref 8.9–10.3)
Chloride: 106 mmol/L (ref 98–111)
Creatinine, Ser: 0.74 mg/dL (ref 0.44–1.00)
GFR, Estimated: >60 mL/min (ref >60)
Glucose, Bld: 178 mg/dL — ABNORMAL HIGH (ref 70–99)
Potassium: 3.8 mmol/L (ref 3.5–5.1)
Sodium: 137 mmol/L (ref 135–145)
Total Bilirubin: 0.3 mg/dL (ref 0.0–1.2)
Total Protein: 9 g/dL — ABNORMAL HIGH (ref 6.5–8.1)

## 2023-12-12 LAB — URINALYSIS, ROUTINE W REFLEX MICROSCOPIC
Bilirubin Urine: NEGATIVE
Glucose, UA: NEGATIVE mg/dL
Ketones, ur: 5 mg/dL — AB
Nitrite: NEGATIVE
Protein, ur: NEGATIVE mg/dL
Specific Gravity, Urine: 1.011 (ref 1.005–1.030)
WBC, UA: >50 WBC/hpf (ref 0–5)
pH: 6 (ref 5.0–8.0)

## 2023-12-12 LAB — VALPROIC ACID LEVEL: Valproic Acid Lvl: 36 ug/mL — ABNORMAL LOW (ref 50–100)

## 2023-12-12 LAB — RAPID URINE DRUG SCREEN, HOSP PERFORMED
Amphetamines: NOT DETECTED
Barbiturates: NOT DETECTED
Benzodiazepines: NOT DETECTED
Cocaine: NOT DETECTED
Opiates: NOT DETECTED
Tetrahydrocannabinol: NOT DETECTED

## 2023-12-12 LAB — ETHANOL: Alcohol, Ethyl (B): <15 mg/dL (ref <15)

## 2023-12-12 LAB — TSH: TSH: 1.399 u[IU]/mL (ref 0.350–4.500)

## 2023-12-12 LAB — POC URINE PREG, ED: Preg Test, Ur: NEGATIVE

## 2023-12-12 LAB — SALICYLATE LEVEL: Salicylate Lvl: <7 mg/dL — ABNORMAL LOW (ref 7.0–30.0)

## 2023-12-12 LAB — CBG MONITORING, ED: Glucose-Capillary: 210 mg/dL — ABNORMAL HIGH (ref 70–99)

## 2023-12-12 LAB — ACETAMINOPHEN LEVEL: Acetaminophen (Tylenol), Serum: <10 ug/mL — ABNORMAL LOW (ref 10–30)

## 2023-12-12 MED ORDER — SENNA 8.6 MG PO TABS: 2.0000 | ORAL_TABLET | Freq: Every evening | ORAL | Status: DC | PRN

## 2023-12-12 MED ORDER — QUETIAPINE FUMARATE 100 MG PO TABS
800.0000 mg | ORAL_TABLET | Freq: Every day | ORAL | Status: DC
  Administered 2023-12-12: 800 mg via ORAL
  Filled 2023-12-12: qty 8

## 2023-12-12 MED ORDER — TAMOXIFEN CITRATE 10 MG PO TABS
20.0000 mg | ORAL_TABLET | Freq: Every day | ORAL | Status: DC
  Administered 2023-12-13: 20 mg via ORAL
  Filled 2023-12-12 (×2): qty 2

## 2023-12-12 MED ORDER — BENZTROPINE MESYLATE 1 MG PO TABS
0.5000 mg | ORAL_TABLET | Freq: Two times a day (BID) | ORAL | Status: DC
  Administered 2023-12-12 – 2023-12-13 (×2): 0.5 mg via ORAL
  Filled 2023-12-12 (×2): qty 1

## 2023-12-12 MED ORDER — HYDROXYZINE HCL 25 MG PO TABS
25.0000 mg | ORAL_TABLET | Freq: Three times a day (TID) | ORAL | Status: DC
  Administered 2023-12-12 – 2023-12-13 (×2): 25 mg via ORAL
  Filled 2023-12-12 (×2): qty 1

## 2023-12-12 MED ORDER — ATORVASTATIN CALCIUM 10 MG PO TABS
10.0000 mg | ORAL_TABLET | Freq: Every day | ORAL | Status: DC
  Administered 2023-12-13: 10 mg via ORAL
  Filled 2023-12-12: qty 1

## 2023-12-12 MED ORDER — INSULIN ASPART 100 UNIT/ML IJ SOLN
0.0000 [IU] | INTRAMUSCULAR | Status: DC
  Administered 2023-12-12: 3 [IU] via SUBCUTANEOUS
  Administered 2023-12-13: 9 [IU] via SUBCUTANEOUS
  Administered 2023-12-13: 5 [IU] via SUBCUTANEOUS
  Administered 2023-12-13: 2 [IU] via SUBCUTANEOUS
  Administered 2023-12-13: 3 [IU] via SUBCUTANEOUS
  Filled 2023-12-12 (×5): qty 1

## 2023-12-12 MED ORDER — ACETAZOLAMIDE ER 500 MG PO CP12
500.0000 mg | ORAL_CAPSULE | Freq: Two times a day (BID) | ORAL | Status: DC
  Administered 2023-12-12 – 2023-12-13 (×2): 500 mg via ORAL
  Filled 2023-12-12 (×4): qty 1

## 2023-12-12 MED ORDER — ASPIRIN 81 MG PO TBEC
81.0000 mg | DELAYED_RELEASE_TABLET | Freq: Every day | ORAL | Status: DC
  Administered 2023-12-13: 81 mg via ORAL
  Filled 2023-12-12: qty 1

## 2023-12-12 MED ORDER — INSULIN GLARGINE-YFGN 100 UNIT/ML ~~LOC~~ SOLN
25.0000 [IU] | Freq: Every day | SUBCUTANEOUS | Status: DC
  Administered 2023-12-12: 25 [IU] via SUBCUTANEOUS
  Filled 2023-12-12 (×2): qty 0.25

## 2023-12-12 MED ORDER — AMLODIPINE BESYLATE 5 MG PO TABS
5.0000 mg | ORAL_TABLET | Freq: Every day | ORAL | Status: DC
  Administered 2023-12-13: 5 mg via ORAL
  Filled 2023-12-12: qty 1

## 2023-12-12 MED ORDER — GABAPENTIN 400 MG PO CAPS
400.0000 mg | ORAL_CAPSULE | Freq: Three times a day (TID) | ORAL | Status: DC
  Administered 2023-12-12 – 2023-12-13 (×2): 400 mg via ORAL
  Filled 2023-12-12 (×2): qty 1

## 2023-12-12 MED ORDER — METFORMIN HCL 500 MG PO TABS
1000.0000 mg | ORAL_TABLET | Freq: Two times a day (BID) | ORAL | Status: DC
  Administered 2023-12-13: 1000 mg via ORAL
  Filled 2023-12-12: qty 2

## 2023-12-12 MED ORDER — RISPERIDONE 1 MG PO TBDP
3.0000 mg | ORAL_TABLET | Freq: Every day | ORAL | Status: DC
  Administered 2023-12-13: 3 mg via ORAL
  Filled 2023-12-12: qty 3

## 2023-12-12 MED ORDER — DIVALPROEX SODIUM 250 MG PO DR TAB
1000.0000 mg | DELAYED_RELEASE_TABLET | Freq: Every evening | ORAL | Status: DC
Start: 2023-12-12 — End: 2023-12-13
  Administered 2023-12-12: 1000 mg via ORAL
  Filled 2023-12-12: qty 4

## 2023-12-12 NOTE — ED Provider Notes (Signed)
 [facility] AT [facility] Provider Note   CSN: [id number] Arrival date & time: 12/12/23  1121     History  Chief Complaint  Patient presents with   Mental Health Problem    Regina Watson is a 58 y.o. female.  Patient is a 58 year old female with a past medical history of schizophrenia who presents to the emergency department the chief complaint of visual and auditory hallucinations.  She notes that she believes that there is someone trying to kill her at her group home.  She also feels as though other individuals have been talking about her.  Patient currently denies any associated suicidal or homicidal ideations.  She denies any alcohol  or illicit substance abuse.  She notes that she has been compliant with her medications.  She does admit to some pain in her back which is chronic in nature.  She denies any chest pain, abdominal pain, fever, chills, nausea, vomiting, diarrhea, dysuria or hematuria.   Mental Health Problem Presenting symptoms: hallucinations        Home Medications Prior to Admission medications   Medication Sig Start Date End Date Taking? Authorizing Provider  acetaminophen  (TYLENOL ) 325 MG tablet Take 650 mg by mouth every 6 (six) hours as needed for mild pain or moderate pain.    [provider]  acetaZOLAMIDE  ER (DIAMOX ) 500 MG capsule Take 1 capsule (500 mg total) by mouth 2 (two) times daily. 01/04/23   Colin Dawley, MD  albuterol  (VENTOLIN  HFA) 108 (90 Base) MCG/ACT inhaler Inhale 2 puffs into the lungs every 4 (four) hours as needed for wheezing or shortness of breath.    [provider]  amLODipine  (NORVASC ) 5 MG tablet Take 1 tablet (5 mg total) by mouth daily. 01/04/23   Colin Dawley, MD  aspirin  EC 81 MG tablet Take 1 tablet (81 mg total) by mouth daily with breakfast. 01/04/23 01/04/24  Colin Dawley, MD  atorvastatin  (LIPITOR) 10 MG tablet Take 1 tablet (10 mg total) by mouth every evening.  01/04/23   Colin Dawley, MD  benztropine  (COGENTIN ) 0.5 MG tablet Take 0.5 mg by mouth 2 (two) times daily.    [provider]  cephALEXin  (KEFLEX ) 500 MG capsule Take 1 capsule (500 mg total) by mouth 4 (four) times daily. 10/15/23   Cheyenne Cotta, MD  colchicine  0.6 MG tablet Take 1 tablet (0.6 mg total) by mouth daily. 01/04/23   Colin Dawley, MD  divalproex  (DEPAKOTE  ER) 500 MG 24 hr tablet Take 2 tablets (1,000 mg total) by mouth at bedtime. 01/04/23   Colin Dawley, MD  furosemide  (LASIX ) 20 MG tablet Take 20 mg by mouth daily.    [provider]  gabapentin  (NEURONTIN ) 400 MG capsule Take 1 capsule (400 mg total) by mouth 3 (three) times daily. 01/04/23   Colin Dawley, MD  hydrOXYzine  (ATARAX ) 25 MG tablet Take 25 mg by mouth 3 (three) times daily.    [provider]  insulin  glargine (LANTUS ) 100 UNIT/ML injection Inject 0.25 mLs (25 Units total) into the skin at bedtime. 01/04/23   Colin Dawley, MD  levocetirizine (XYZAL ) 5 MG tablet Take 5 mg by mouth daily.    [provider]  metFORMIN  (GLUCOPHAGE ) 1000 MG tablet Take 1 tablet (1,000 mg total) by mouth 2 (two) times daily with a meal. 01/04/23   Emokpae, Courage, MD  omeprazole  (PRILOSEC) 40 MG capsule TAKE ONE CAPSULE BY MOUTH ONCE DAILY. NEEDS OFFICE VISIT 05/05/23   Urban Garden, MD  polyethylene  glycol (MIRALAX  / GLYCOLAX ) 17 g packet Take 17 g by mouth daily.    [provider]  potassium chloride  (KLOR-CON ) 10 MEQ tablet Take 1 tablet (10 mEq total) by mouth daily. Take While taking Diamox /acetazolamide  01/04/23   Colin Dawley, MD  QUEtiapine  (SEROQUEL  XR) 400 MG 24 hr tablet Take 2 tablets (800 mg total) by mouth at bedtime. 01/04/23   Colin Dawley, MD  risperiDONE  (RISPERDAL  M-TABS) 3 MG disintegrating tablet Take 1 tablet (3 mg total) by mouth every morning. 01/04/23   Colin Dawley, MD  Semaglutide ,0.25 or 0.5MG /DOS, (OZEMPIC , 0.25 OR 0.5 MG/DOSE,) 2  MG/1.5ML SOPN Inject 0.25 mg into the skin every Friday. 01/06/23   Colin Dawley, MD  sennosides-docusate sodium  (SENOKOT-S) 8.6-50 MG tablet Take 2 tablets by mouth at bedtime. 01/04/23   Emokpae, Courage, MD  SYSTANE ULTRA PF 0.4-0.3 % SOLN Place 2 drops into both eyes in the morning, at noon, and at bedtime. 12/21/22   [provider]  tamoxifen  (NOLVADEX ) 20 MG tablet Take 1 tablet (20 mg total) by mouth daily. 01/04/23   Colin Dawley, MD  tiotropium (SPIRIVA ) 18 MCG inhalation capsule Place 1 capsule (18 mcg total) into inhaler and inhale daily. 01/04/23   Colin Dawley, MD  traMADol  (ULTRAM ) 50 MG tablet Take 1 tablet (50 mg total) by mouth every 8 (eight) hours as needed for severe pain or moderate pain. 01/04/23   Colin Dawley, MD  Vitamin D , Ergocalciferol , (DRISDOL ) 1.25 MG (50000 UNIT) CAPS capsule Take 50,000 Units by mouth every Saturday.    [provider]      Allergies    Ziprasidone  hcl, Penicillinase, Sulfa antibiotics, Sulfamethoxazole-trimethoprim, Penicillins, and Latex    Review of Systems   Review of Systems  Psychiatric/Behavioral:  Positive for hallucinations.   All other systems reviewed and are negative.   Physical Exam Updated Vital Signs BP (!) 168/84   Pulse 90   Temp 97.6 F (36.4 C) (Oral)   Resp 16   Ht 5\' 9"  (1.753 m)   Wt 90.7 kg   SpO2 100%   BMI 29.53 kg/m  Physical Exam Vitals and nursing note reviewed.  Constitutional:      Appearance: Normal appearance.  HENT:     Head: Normocephalic and atraumatic.     Nose: Nose normal.     Mouth/Throat:     Mouth: Mucous membranes are moist.  Eyes:     Extraocular Movements: Extraocular movements intact.     Conjunctiva/sclera: Conjunctivae normal.     Pupils: Pupils are equal, round, and reactive to light.  Cardiovascular:     Rate and Rhythm: Normal rate and regular rhythm.     Pulses: Normal pulses.     Heart sounds: Normal heart sounds. No murmur heard.    No  gallop.  Pulmonary:     Effort: Pulmonary effort is normal. No respiratory distress.     Breath sounds: Normal breath sounds. No stridor. No wheezing, rhonchi or rales.  Abdominal:     General: Abdomen is flat. Bowel sounds are normal. There is no distension.     Palpations: Abdomen is soft.     Tenderness: There is no abdominal tenderness. There is no guarding.  Musculoskeletal:        General: Normal range of motion.     Cervical back: Normal range of motion and neck supple.     Right lower leg: No edema.     Left lower leg: No edema.  Skin:    General:  Skin is warm and dry.     Findings: No rash.  Neurological:     General: No focal deficit present.     Mental Status: She is alert and oriented to person, place, and time. Mental status is at baseline.     Cranial Nerves: No cranial nerve deficit.     Sensory: No sensory deficit.     Motor: No weakness.     Coordination: Coordination normal.     Gait: Gait normal.  Psychiatric:        Attention and Perception: She is attentive. She perceives auditory and visual hallucinations.        Mood and Affect: Mood is anxious. Mood is not depressed. Affect is not blunt, flat, angry or tearful.        Speech: She is communicative. Speech is not delayed, slurred or tangential.        Behavior: Behavior normal. Behavior is not agitated, aggressive, hyperactive or combative.        Thought Content: Thought content is paranoid and delusional. Thought content does not include homicidal or suicidal ideation. Thought content does not include homicidal or suicidal plan.        Judgment: Judgment normal. Judgment is not impulsive or inappropriate.     ED Results / Procedures / Treatments   Labs (all labs ordered are listed, but only abnormal results are displayed) Labs Reviewed  COMPREHENSIVE METABOLIC PANEL WITH GFR  ETHANOL  RAPID URINE DRUG SCREEN, HOSP PERFORMED  CBC WITH DIFFERENTIAL/PLATELET  SALICYLATE LEVEL  ACETAMINOPHEN  LEVEL   URINALYSIS, ROUTINE W REFLEX MICROSCOPIC  POC URINE PREG, ED    EKG None  Radiology No results found.  Procedures Procedures    Medications Ordered in ED Medications - No data to display  ED Course/ Medical Decision Making/ A&P                                 Medical Decision Making Patient does remain stable at this time.  Patient's blood work has been otherwise unremarkable.  Depakote  level has been ordered and is pending.  Urinalysis is pending at this time for final medical clearance.  Will sign patient out to Catherne Clubs at 1836 on 12/12/23 pending final medical clearance.  Amount and/or Complexity of Data Reviewed Labs: ordered.  Risk OTC drugs. Prescription drug management.           Final Clinical Impression(s) / ED Diagnoses Final diagnoses:  None    Rx / DC Orders ED Discharge Orders     None         Emmalene Hare 12/12/23 1836    Iva Mariner, MD 12/13/23 [phone]

## 2023-12-12 NOTE — ED Provider Notes (Cosign Needed Addendum)
   Patient signed out to me by Pearletha Bouche, PA-C pending medical clearance.  Patient has past medical history of schizophrenia and here with reported auditory and visual hallucinations.  She is currently residing at a group home and feels that someone there is trying to kill her.  She states that she is seeing "people hanging from the ceiling and I hear gunshots outside."  See previous provider note for complete H&P  Workup without obvious UTI, urine cultures pending.  Patient does take Depakote  and valproic acid  level slightlylow at 36.  Labs otherwise unremarkable. Her routine medications have been ordered including her Depakote  which she takes 1000 mg at bedtime  Patient is awaiting TTS consultation, she is cooperative, remains voluntary.   she is medically clear at this time.  On recheck resting comfortably and still awaiting TTS.  Discussed with overnight provider, Dr. Candelaria Chaco.  I suspect she will need inpatient for her paranoia and hallucinations.  She has remained cooperative, but will need IVC if she threatens to leave     Catherne Clubs, PA-C 12/12/23 2341    Catherne Clubs, PA-C 12/12/23 2358    Carin Charleston, MD 12/13/23 [phone]

## 2023-12-12 NOTE — ED Triage Notes (Addendum)
 Pt with hx of schizophrenia is having visual and auditory hallucinations.  Pt thinks someone is trying to kill her and is upset with the staff at the facility because they tell her she has bulging eyes.  Pt has been compliant with medications.

## 2023-12-13 DIAGNOSIS — F2089 Other schizophrenia: Secondary | ICD-10-CM

## 2023-12-13 LAB — CBG MONITORING, ED
Glucose-Capillary: 157 mg/dL — ABNORMAL HIGH (ref 70–99)
Glucose-Capillary: 216 mg/dL — ABNORMAL HIGH (ref 70–99)
Glucose-Capillary: 254 mg/dL — ABNORMAL HIGH (ref 70–99)
Glucose-Capillary: 361 mg/dL — ABNORMAL HIGH (ref 70–99)

## 2023-12-13 MED ORDER — DIVALPROEX SODIUM 250 MG PO DR TAB: 1250.0000 mg | DELAYED_RELEASE_TABLET | Freq: Every evening | ORAL | Status: DC

## 2023-12-13 MED ORDER — DIVALPROEX SODIUM 250 MG PO DR TAB: 1250.0000 mg | DELAYED_RELEASE_TABLET | Freq: Every evening | ORAL | 0 refills | Status: AC

## 2023-12-13 MED ORDER — RISPERIDONE 1 MG PO TABS: 0.5000 mg | ORAL_TABLET | Freq: Two times a day (BID) | ORAL | 0 refills | Status: DC | PRN
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
## 2023-12-13 NOTE — ED Provider Notes (Addendum)
 Emergency Medicine Observation Re-evaluation Note  Regina Watson is a 58 y.o. female, seen on rounds today.  Pt initially presented to the ED for complaints of Mental Health Problem Currently, the patient is awake, going to bathroom with tech.  Physical Exam  BP 124/79 (BP Location: Right Arm)   Pulse 86   Temp 98 F (36.7 C) (Oral)   Resp 17   Ht 5\' 9"  (1.753 m)   Wt 90.7 kg   SpO2 97%   BMI 29.53 kg/m  Physical Exam General: NAD Cardiac: regular rate  Lungs: equal chest rise Psych: calm  ED Course / MDM  EKG:   I have reviewed the labs performed to date as well as medications administered while in observation.  Recent changes in the last 24 hours include pt saw psych, recommended inpatient treatment.  Plan  Current plan is for placement for psychiatric care.    Mordecai Applebaum, MD 12/13/23 [phone]  Patient was re-evaluated by psychiatry this morning, feel she has improved and stable for discharge with outpatient follow up. They have increased her depakote  dosing and sent to pharmacy.     Mordecai Applebaum, MD 12/13/23 [phone]

## 2023-12-13 NOTE — ED Notes (Signed)
 Called facility again and asked when they would be here and they stated in a few minutes. Talked to facility supervisor about discharge paperwork and new prescription changes.

## 2023-12-13 NOTE — ED Notes (Addendum)
 Pt completing TTS at this time.   Bonnita Buttner, NP recommends inpatient hospitalization.

## 2023-12-13 NOTE — Consult Note (Signed)
 [facility] Psychiatric Consult Initial  Patient Name: .Regina Watson  MRN: [id number]  DOB: April 19, 1966  Consult Order details:  Orders (From admission, onward)     Start     Ordered   12/12/23 1535  CONSULT TO CALL ACT TEAM       Ordering Provider: Roselynn Connors, PA-C  Provider:  (Not yet assigned)  Question:  Reason for Consult?  Answer:  hallucination, paranoia   12/12/23 1534             Mode of Visit: Tele-visit Virtual Statement:TELE PSYCHIATRY ATTESTATION & CONSENT As the provider for this telehealth consult, I attest that I verified the patient's identity using two separate identifiers, introduced myself to the patient, provided my credentials, disclosed my location, and performed this encounter via a HIPAA-compliant, real-time, face-to-face, two-way, interactive audio and video platform and with the full consent and agreement of the patient (or guardian as applicable.) Patient physical location: [facility]. Telehealth provider physical location: home office in state of [place].   Video start time: 0800 Video end time: 0820    Psychiatry Consult Evaluation  Service Date: December 13, 2023 LOS:  LOS: 0 days  Chief Complaint "I don't like it at that house"  Primary Psychiatric Diagnoses  schizophrenia 2.   3.    Assessment  Regina Watson is a 58 y.o. female admitted: Presented to the EDfor 12/12/2023 11:22 AM for complaints of auditory and visual hallucinations. She carries the psychiatric diagnoses of schizophrenia..    Please see plan below for detailed recommendations.   Diagnoses:  Active Hospital problems: Principal Problem:   Schizophrenia (HCC)    Plan   ## Psychiatric Medication Recommendations:  - Increase Depakote  to 1250 mg at bed time  - start Risperidone  0.5 mg BID PRN for agitation/delusions  ## Medical Decision Making Capacity: Patient has a guardian and has thus been adjudicated incompetent; please involve patients guardian in medical decision  making   ## Disposition:-- There are no psychiatric contraindications to discharge at this time  ## Behavioral / Environmental: - No specific recommendations at this time.     ## Safety and Observation Level:  - Based on my clinical evaluation, I estimate the patient to be at low risk of self harm in the current setting. - At this time, we recommend  routine. This decision is based on my review of the chart including patient's history and current presentation, interview of the patient, mental status examination, and consideration of suicide risk including evaluating suicidal ideation, plan, intent, suicidal or self-harm behaviors, risk factors, and protective factors. This judgment is based on our ability to directly address suicide risk, implement suicide prevention strategies, and develop a safety plan while the patient is in the clinical setting. Please contact our team if there is a concern that risk level has changed.  CSSR Risk Category:C-SSRS RISK CATEGORY: No Risk  Suicide Risk Assessment: Patient has following modifiable risk factors for suicide: recklessness, which we are addressing by medication adjustments. Patient has following non-modifiable or demographic risk factors for suicide: psychiatric hospitalization Patient has the following protective factors against suicide: Access to outpatient mental health care and Cultural, spiritual, or religious beliefs that discourage suicide  Thank you for this consult request. Recommendations have been communicated to the primary team.  We will psych clear for d/c at this time.   Roise Cleaver, NP       History of Present Illness  Relevant Aspects of Hospital ED Course:  Patient is a 58 year old  female with a past medical history of schizophrenia who presents to the emergency department the chief complaint of visual and auditory hallucinations. She notes that she believes that there is someone trying to kill her at her group home. She  also feels as though other individuals have been talking about her. Patient currently denies any associated suicidal or homicidal ideations. She denies any alcohol  or illicit substance abuse. She notes that she has been compliant with her medications. She does admit to some pain in her back which is chronic in nature. She denies any chest pain, abdominal pain, fever, chills, nausea, vomiting, diarrhea, dysuria or hematuria.   Patient Report:  Pt seen at [facility] for psychiatric evaluation. Pt stated she slept well, no problems with appetite. Pt does have garbled speech so it was difficult to understand her at times. She stated she is not experiencing any hallucinations today, but yesterday she felt like she was seeing people, and people were trying to hurt her. Pt stated she does not know what triggered this event, stated her hallucinations are not chronic in nature. She does mention not liking where she is currently living. States she would like to live somewhere new. Eplained this request is not something that can be accomplished in the hospital setting. She expressed understanding. She denies SI. Denies HI. Denies any access to weapons or firearms. She denies illicit substance use and alcohol  use. She reports compliance with current medication regimen.   [facility], [phone], where the patient resides. She stated the patient is very cigarette driven. She gets three cigarettes in the morning, afternoon, and two at night. If the patient asks for cigarette outside of allotted hours, or she is unable to have any more for the day, she states this is a trigger for the patient. This is what occurred yesterday, she was told she could not have a cigarette and the patient became very upset, started saying they were out to get her, and that she wanted to go to the hospital. When she mentioned having hallucination, that is when they brought her to the hospital. I did explain  today the patient has improved, denies SI/HI/AVH. They have no concerns with the patient returning to their facility today, they will be here around noon to pick her up.   I did explain medication adjustments. Valproic acid  level resulted subtherapeutic at 36 yesterday 12/12/23. Will increase Depakote  to 1250 mg at bedtime. Also prescribed Risperidone  0.5 mg BID PRN for agitation, delusional behaviors. Medications sent to preferred pharmacy.   Review of Systems  All other systems reviewed and are negative.    Psychiatric and Social History  Psychiatric History:  Information collected from patient, chart  Prev Dx/Sx: schizophrenia Current Psych Provider: yes Home Meds (current): see mar Previous Med Trials: unknown Therapy: unknown  Prior Psych Hospitalization: yes  Prior Self Harm: denies Prior Violence: denies    Access to weapons/lethal means: denies   Substance History Alcohol : denies  Tobacco: yes Illicit drugs: denies Prescription drug abuse: denies Rehab hx: denies  Exam Findings  Physical Exam:  Vital Signs:  Temp:  [97.6 F (36.4 C)-98.2 F (36.8 C)] 98.2 F (36.8 C) (04/23 0755) Pulse Rate:  [86-102] 102 (04/23 0755) Resp:  [16-20] 20 (04/23 0755) BP: (119-168)/(75-84) 119/78 (04/23 0755) SpO2:  [96 %-100 %] 96 % (04/23 0755) Weight:  [90.7 kg] 90.7 kg (04/22 1136) Blood pressure 119/78, pulse (!) 102, temperature 98.2 F (36.8 C), temperature source  Oral, resp. rate 20, height 5\' 9"  (1.753 m), weight 90.7 kg, SpO2 96%. Body mass index is 29.53 kg/m.  Physical Exam Vitals and nursing note reviewed.  Neurological:     Mental Status: She is alert and oriented to person, place, and time.     Mental Status Exam: General Appearance: Fairly Groomed  Orientation:  Full (Time, Place, and Person)  Memory:  Immediate;   Fair Recent;   Fair  Concentration:  Concentration: Fair  Recall:  Fair  Attention  Fair  Eye Contact:  Fair  Speech:  Garbled   Language:  Fair  Volume:  Normal  Mood: "better"  Affect:  Flat  Thought Process:  Goal Directed  Thought Content:  WDL  Suicidal Thoughts:  No  Homicidal Thoughts:  No  Judgement:  Fair  Insight:  Fair  Psychomotor Activity:  Normal  Akathisia:  No  Fund of Knowledge:  Fair      Assets:  Communication Skills Desire for Improvement  Cognition:  WNL  ADL's:  Intact  AIMS (if indicated):        Other History   These have been pulled in through the EMR, reviewed, and updated if appropriate.  Family History:  The patient's family history includes Cancer in her mother; Diabetes in her father; Hypertension in her mother.  Medical History: Past Medical History:  Diagnosis Date   Anemia    Blind    Breast cancer (HCC)    Diabetes mellitus without complication (HCC)    Hypertension    MDD (major depressive disorder)    Neuropathy 2010   Schizophrenia (HCC)     Surgical History: Past Surgical History:  Procedure Laterality Date   BIOPSY  04/12/2022   Procedure: BIOPSY;  Surgeon: Urban Garden, MD;  [facility];  Service: Gastroenterology;;   COLONOSCOPY WITH PROPOFOL  N/A 04/12/2022   Procedure: COLONOSCOPY WITH PROPOFOL ;  Surgeon: Urban Garden, MD;  [facility];  Service: Gastroenterology;  Laterality: N/A;  1230 ASA 3 [facility]   ESOPHAGOGASTRODUODENOSCOPY (EGD) WITH PROPOFOL  N/A 04/12/2022   Procedure: ESOPHAGOGASTRODUODENOSCOPY (EGD) WITH PROPOFOL ;  Surgeon: Urban Garden, MD;  [facility];  Service: Gastroenterology;  Laterality: N/A;   INCISION AND DRAINAGE ABSCESS Left 05/08/2022   Procedure: INCISION AND DRAINAGE ABSCESS of left breast abscess;  Surgeon: Marijo Shove, DO;  Location: [facility];  Service: General;  Laterality: Left;   TOE AMPUTATION       Medications:   Current Facility-Administered Medications:    acetaZOLAMIDE  ER (DIAMOX ) 12 hr capsule 500  mg, 500 mg, Oral, Q12H, Rigney, Coy Ditty, PA-C, 500 mg at 12/12/23 2133   amLODipine  (NORVASC ) tablet 5 mg, 5 mg, Oral, Daily, Rigney, Christopher D, PA-C   aspirin  EC tablet 81 mg, 81 mg, Oral, Daily, Rigney, Christopher D, PA-C   atorvastatin  (LIPITOR) tablet 10 mg, 10 mg, Oral, Daily, Rigney, Christopher D, PA-C   benztropine  (COGENTIN ) tablet 0.5 mg, 0.5 mg, Oral, BID, Rigney, Coy Ditty, PA-C, 0.5 mg at 12/12/23 2130   divalproex  (DEPAKOTE ) DR tablet 1,250 mg, 1,250 mg, Oral, QPM, Roise Cleaver, NP   gabapentin  (NEURONTIN ) capsule 400 mg, 400 mg, Oral, TID, Roselynn Connors, PA-C, 400 mg at 12/12/23 2129   hydrOXYzine  (ATARAX ) tablet 25 mg, 25 mg, Oral, TID, Rigney, Christopher D, PA-C, 25 mg at 12/12/23 2130   insulin  aspart (novoLOG ) injection 0-9 Units, 0-9 Units, Subcutaneous, Q4H, Rigney, Christopher D, PA-C, 2 Units at  12/13/23 0809   insulin  glargine-yfgn (SEMGLEE ) injection 25 Units, 25 Units, Subcutaneous, QHS, Roselynn Connors, PA-C, 25 Units at 12/12/23 2133   metFORMIN  (GLUCOPHAGE ) tablet 1,000 mg, 1,000 mg, Oral, BID WC, Roselynn Connors, PA-C, 1,000 mg at 12/13/23 2725   QUEtiapine  (SEROQUEL ) tablet 800 mg, 800 mg, Oral, QHS, Roselynn Connors, PA-C, 800 mg at 12/12/23 2131   risperiDONE  (RISPERDAL  M-TABS) disintegrating tablet 3 mg, 3 mg, Oral, Daily, Rigney, Christopher D, PA-C   senna (SENOKOT) tablet 17.2 mg, 2 tablet, Oral, QHS PRN, Rigney, Christopher D, PA-C   tamoxifen  (NOLVADEX ) tablet 20 mg, 20 mg, Oral, Daily, Rigney, Christopher D, PA-C  Current Outpatient Medications:    fluconazole (DIFLUCAN) 100 MG tablet, Take 100 mg by mouth daily., Disp: , Rfl:    LANTUS  SOLOSTAR 100 UNIT/ML Solostar Pen, Inject into the skin., Disp: , Rfl:    NOVOLOG  FLEXPEN 100 UNIT/ML FlexPen, Inject into the skin., Disp: , Rfl:    risperiDONE  (RISPERDAL ) 1 MG tablet, Take 0.5 tablets (0.5 mg total) by mouth 2 (two) times daily as needed (agitation)., Disp: 30  tablet, Rfl: 0   triamcinolone cream (KENALOG) 0.1 %, Apply 1 Application topically., Disp: , Rfl:    acetaminophen  (TYLENOL ) 325 MG tablet, Take 650 mg by mouth every 6 (six) hours as needed for mild pain or moderate pain., Disp: , Rfl:    acetaZOLAMIDE  ER (DIAMOX ) 500 MG capsule, Take 1 capsule (500 mg total) by mouth 2 (two) times daily., Disp: 60 capsule, Rfl: 3   albuterol  (VENTOLIN  HFA) 108 (90 Base) MCG/ACT inhaler, Inhale 2 puffs into the lungs every 4 (four) hours as needed for wheezing or shortness of breath., Disp: , Rfl:    amLODipine  (NORVASC ) 5 MG tablet, Take 1 tablet (5 mg total) by mouth daily., Disp: 30 tablet, Rfl: 5   aspirin  EC 81 MG tablet, Take 1 tablet (81 mg total) by mouth daily with breakfast., Disp: 30 tablet, Rfl: 2   atorvastatin  (LIPITOR) 10 MG tablet, Take 1 tablet (10 mg total) by mouth every evening., Disp: 30 tablet, Rfl: 5   benztropine  (COGENTIN ) 0.5 MG tablet, Take 0.5 mg by mouth 2 (two) times daily., Disp: , Rfl:    cephALEXin  (KEFLEX ) 500 MG capsule, Take 1 capsule (500 mg total) by mouth 4 (four) times daily., Disp: 28 capsule, Rfl: 0   colchicine  0.6 MG tablet, Take 1 tablet (0.6 mg total) by mouth daily., Disp: 30 tablet, Rfl: 5   divalproex  (DEPAKOTE ) 250 MG DR tablet, Take 5 tablets (1,250 mg total) by mouth every evening., Disp: 150 tablet, Rfl: 0   furosemide  (LASIX ) 20 MG tablet, Take 20 mg by mouth daily., Disp: , Rfl:    gabapentin  (NEURONTIN ) 400 MG capsule, Take 1 capsule (400 mg total) by mouth 3 (three) times daily., Disp: 90 capsule, Rfl: 0   hydrOXYzine  (ATARAX ) 25 MG tablet, Take 25 mg by mouth 3 (three) times daily., Disp: , Rfl:    insulin  glargine (LANTUS ) 100 UNIT/ML injection, Inject 0.25 mLs (25 Units total) into the skin at bedtime., Disp: 10 mL, Rfl: 11   levocetirizine (XYZAL ) 5 MG tablet, Take 5 mg by mouth daily., Disp: , Rfl:    metFORMIN  (GLUCOPHAGE ) 1000 MG tablet, Take 1 tablet (1,000 mg total) by mouth 2 (two) times daily with  a meal., Disp: 60 tablet, Rfl: 5   omeprazole  (PRILOSEC) 40 MG capsule, TAKE ONE CAPSULE BY MOUTH ONCE DAILY. NEEDS OFFICE VISIT, Disp: 30 capsule, Rfl: 0  polyethylene glycol (MIRALAX  / GLYCOLAX ) 17 g packet, Take 17 g by mouth daily., Disp: , Rfl:    potassium chloride  (KLOR-CON ) 10 MEQ tablet, Take 1 tablet (10 mEq total) by mouth daily. Take While taking Diamox /acetazolamide , Disp: 30 tablet, Rfl: 1   QUEtiapine  (SEROQUEL  XR) 400 MG 24 hr tablet, Take 2 tablets (800 mg total) by mouth at bedtime., Disp: 30 tablet, Rfl: 5   risperiDONE  (RISPERDAL  M-TABS) 3 MG disintegrating tablet, Take 1 tablet (3 mg total) by mouth every morning., Disp: 30 tablet, Rfl: 5   Semaglutide ,0.25 or 0.5MG /DOS, (OZEMPIC , 0.25 OR 0.5 MG/DOSE,) 2 MG/1.5ML SOPN, Inject 0.25 mg into the skin every Friday., Disp: 3 mL, Rfl: 4   sennosides-docusate sodium  (SENOKOT-S) 8.6-50 MG tablet, Take 2 tablets by mouth at bedtime., Disp: 120 tablet, Rfl: 4   SYSTANE ULTRA PF 0.4-0.3 % SOLN, Place 2 drops into both eyes in the morning, at noon, and at bedtime., Disp: , Rfl:    tamoxifen  (NOLVADEX ) 20 MG tablet, Take 1 tablet (20 mg total) by mouth daily., Disp: 30 tablet, Rfl: 4   tiotropium (SPIRIVA ) 18 MCG inhalation capsule, Place 1 capsule (18 mcg total) into inhaler and inhale daily., Disp: 30 capsule, Rfl: 12   traMADol  (ULTRAM ) 50 MG tablet, Take 1 tablet (50 mg total) by mouth every 8 (eight) hours as needed for severe pain or moderate pain., Disp: 10 tablet, Rfl: 0   Vitamin D , Ergocalciferol , (DRISDOL ) 1.25 MG (50000 UNIT) CAPS capsule, Take 50,000 Units by mouth every Saturday., Disp: , Rfl:   Allergies: Allergies  Allergen Reactions   Ziprasidone  Hcl Other (See Comments)    Psychotic reaction   Penicillinase Rash and Swelling   Sulfa Antibiotics Other (See Comments) and Hives   Sulfamethoxazole-Trimethoprim Hives and Other (See Comments)    Dermatitis Bullous lesions   Penicillins Other (See Comments)    Unknown      Latex Itching    Roise Cleaver, NP

## 2023-12-13 NOTE — ED Notes (Signed)
 Patient LG called and she was updated on patients condition and they are aware that patient is being discharged and going back to facility.

## 2023-12-13 NOTE — Discharge Instructions (Addendum)
 Depakote  has been increased to 1250 mg at bed time  Continue taking Risperidone  3 mg daily. An additional Risperidone  0.5 mg can be taken up to two times daily as needed for agitation or delusions.   Depakote  1250 mg and Risperidone  0.5 mg have been seen to your preferred pharmacy     Behavioral Health Resources:  Intensive Outpatient Programs: [facility]      [address] [place], [place] [phone] Both a day and evening program       [facility] Outpatient     [address]        [place], [place] [address] [phone]         ADS: [facility] [address] [place] [phone]  [facility] Mental Health ACCESS LINE: [phone] or [phone] [address] [place], [place] [address] [email or web address]   Substance Abuse Resources: [facility]  [facility]  [phone] [facility] [phone] [facility] [phone] Kenny Peals [phone] Residential & Outpatient Substance Abuse Program  [phone]  Psychological Services: [facility]  [phone] [facility]  [phone] [facility], [phone] [place]. [address], [place], ACCESS LINE: [phone] or [phone], [email or web address]  Mobile Crisis Teams:                                        [facility]         Mobile Crisis Care Unit [phone]             [facility] [address] Jonette Nestle [phone]                                         [facility] [address] [place] [address] [place] [phone]  Self-Help/Support Groups: [facility] of [employer] of support groups [phone] (call for more info)  Narcotics Anonymous (NA) [facility] [address] [place] - 2 meetings at this location  Residential  Treatment Programs:  [facility]      [address]        [place]       [phone]         [facility] [address] [place] [address] [place], [place]  [address] [phone]  [facility]  [address] [place], [place] [address] [phone] Admissions: 8am-3pm M-F  [facility]     [address]        [place], [place] [address]       [phone]         [facility] [address] Elda Greener [place], [place] [phone]  [facility] [address] [place], [place] [phone]  [facility] - Intensive Outpatient      [address]     [place], [place]       [phone]         [facility] ([facility]     [address] [place], [place] [phone] or [phone]  [facility] Services ([facility]), Medicaid [address] [place], [place] [phone]  [facility]  [address] [place] [phone]  [facility] Resources: [phone]               General Therapy                                                Trixie Furnace, PhD        [address] [place], [place] [address]         [phone]   Insurance  [facility]   [address] [place], [place] [address] [phone]  [facility] [address] [place], [place] [address] [phone] Insurance/Medicaid/sponsorship through [facility]                                              [address]                                        [place], [place] [address]    Therapy/tele-psych/case         [phone]          [facility] [address], [place]  [address]  Adolescent/group home/case management [phone]                                           Artist Binet  PhD       General therapy       Insurance   [phone]         Dr. Carlos [place], Insurance, M-F [phone]  [facility]  [employer] [facility] Dept. [address]                 [address]         [address] [place] [address]  Clara Crisp Phone:  [phone]                                  Phone:  [phone]                   Phone:  [phone]  [facility], [phone] [facility] [place]- [phone]       -     [facility] in [place], [address]             [phone], Insurance

## 2023-12-13 NOTE — ED Notes (Addendum)
 Contacted [facility] to let them know patient is being discharged and let them know that patients status and new medications. They stated someone would be here to pick patient up around noon. I also attempted to reach out to legal guardian and left message on a voicemail.

## 2023-12-30 ENCOUNTER — Other Ambulatory Visit: Payer: Self-pay

## 2023-12-30 ENCOUNTER — Emergency Department (HOSPITAL_COMMUNITY): Admission: EM | Admit: 2023-12-30 | Discharge: 2023-12-31 | Payer: MEDICAID | Attending: Student | Admitting: Student

## 2023-12-30 ENCOUNTER — Encounter (HOSPITAL_COMMUNITY): Payer: Self-pay

## 2023-12-30 DIAGNOSIS — F17200 Nicotine dependence, unspecified, uncomplicated: Secondary | ICD-10-CM | POA: Diagnosis not present

## 2023-12-30 DIAGNOSIS — E1140 Type 2 diabetes mellitus with diabetic neuropathy, unspecified: Secondary | ICD-10-CM | POA: Insufficient documentation

## 2023-12-30 DIAGNOSIS — I10 Essential (primary) hypertension: Secondary | ICD-10-CM | POA: Insufficient documentation

## 2023-12-30 DIAGNOSIS — Z79899 Other long term (current) drug therapy: Secondary | ICD-10-CM | POA: Insufficient documentation

## 2023-12-30 DIAGNOSIS — Z7982 Long term (current) use of aspirin: Secondary | ICD-10-CM | POA: Diagnosis not present

## 2023-12-30 DIAGNOSIS — Z7984 Long term (current) use of oral hypoglycemic drugs: Secondary | ICD-10-CM | POA: Diagnosis not present

## 2023-12-30 DIAGNOSIS — Z91040 Latex allergy status: Secondary | ICD-10-CM | POA: Diagnosis not present

## 2023-12-30 DIAGNOSIS — Z794 Long term (current) use of insulin: Secondary | ICD-10-CM | POA: Insufficient documentation

## 2023-12-30 DIAGNOSIS — R600 Localized edema: Secondary | ICD-10-CM | POA: Diagnosis present

## 2023-12-30 DIAGNOSIS — Z853 Personal history of malignant neoplasm of breast: Secondary | ICD-10-CM | POA: Diagnosis not present

## 2023-12-30 LAB — CBC WITH DIFFERENTIAL/PLATELET
Abs Immature Granulocytes: 0.06 10*3/uL (ref 0.00–0.07)
Basophils Absolute: 0 10*3/uL (ref 0.0–0.1)
Basophils Relative: 0 %
Eosinophils Absolute: 0.2 10*3/uL (ref 0.0–0.5)
Eosinophils Relative: 3 %
HCT: 32.8 % — ABNORMAL LOW (ref 36.0–46.0)
Hemoglobin: 10.5 g/dL — ABNORMAL LOW (ref 12.0–15.0)
Immature Granulocytes: 1 %
Lymphocytes Relative: 21 %
Lymphs Abs: 1.4 10*3/uL (ref 0.7–4.0)
MCH: 29.2 pg (ref 26.0–34.0)
MCHC: 32 g/dL (ref 30.0–36.0)
MCV: 91.4 fL (ref 80.0–100.0)
Monocytes Absolute: 0.7 10*3/uL (ref 0.1–1.0)
Monocytes Relative: 10 %
Neutro Abs: 4.5 10*3/uL (ref 1.7–7.7)
Neutrophils Relative %: 65 %
Platelets: 255 10*3/uL (ref 150–400)
RBC: 3.59 MIL/uL — ABNORMAL LOW (ref 3.87–5.11)
RDW: 14.2 % (ref 11.5–15.5)
WBC: 7 10*3/uL (ref 4.0–10.5)
nRBC: 0 % (ref 0.0–0.2)

## 2023-12-30 LAB — TROPONIN I (HIGH SENSITIVITY): Troponin I (High Sensitivity): 2 ng/L (ref <18)

## 2023-12-30 LAB — COMPREHENSIVE METABOLIC PANEL WITH GFR
ALT: 13 U/L (ref 0–44)
AST: 15 U/L (ref 15–41)
Albumin: 2.9 g/dL — ABNORMAL LOW (ref 3.5–5.0)
Alkaline Phosphatase: 87 U/L (ref 38–126)
Anion gap: 9 (ref 5–15)
BUN: 19 mg/dL (ref 6–20)
CO2: 17 mmol/L — ABNORMAL LOW (ref 22–32)
Calcium: 8.8 mg/dL — ABNORMAL LOW (ref 8.9–10.3)
Chloride: 112 mmol/L — ABNORMAL HIGH (ref 98–111)
Creatinine, Ser: 0.68 mg/dL (ref 0.44–1.00)
GFR, Estimated: >60 mL/min (ref >60)
Glucose, Bld: 200 mg/dL — ABNORMAL HIGH (ref 70–99)
Potassium: 3.6 mmol/L (ref 3.5–5.1)
Sodium: 138 mmol/L (ref 135–145)
Total Bilirubin: 0.2 mg/dL (ref 0.0–1.2)
Total Protein: 6.7 g/dL (ref 6.5–8.1)

## 2023-12-30 LAB — BRAIN NATRIURETIC PEPTIDE: B Natriuretic Peptide: 12 pg/mL (ref 0.0–100.0)

## 2023-12-30 LAB — ETHANOL: Alcohol, Ethyl (B): <15 mg/dL (ref <15)

## 2023-12-30 MED ORDER — FUROSEMIDE 20 MG PO TABS: 20.0000 mg | ORAL_TABLET | Freq: Two times a day (BID) | ORAL | 0 refills | Status: AC

## 2023-12-30 MED ORDER — FUROSEMIDE 10 MG/ML IJ SOLN
40.0000 mg | Freq: Once | INTRAMUSCULAR | Status: AC
  Administered 2023-12-30: 40 mg via INTRAVENOUS
  Filled 2023-12-30: qty 4

## 2023-12-30 NOTE — Discharge Instructions (Addendum)
 Begin taking Lasix  as prescribed.  Elevate your feet is much as possible for the next few days.  Follow-up with primary doctor if symptoms persist.

## 2023-12-30 NOTE — ED Triage Notes (Signed)
 Pt comes from a group home with c/c of leg swelling. Pt states that 'its just tight'. Swelling has been over two weeks she states that 'it just keeps getting bigger and bigger.

## 2023-12-30 NOTE — ED Notes (Signed)
 Pt states that she does not want to go back to the group home. That she does not feel safe there.

## 2023-12-30 NOTE — ED Notes (Signed)
 Legal guardian attempted x2 and after hours attempted x2 as well. Spoke with after hours contact to update on pt status.

## 2023-12-30 NOTE — ED Provider Notes (Signed)
 [place] EMERGENCY DEPARTMENT AT [facility] Provider Note  CSN: [id number] Arrival date & time: 12/30/23 2054  Chief Complaint(s) Leg Swelling  HPI Regina Watson is a 58 y.o. female with PMH schizophrenia, peripheral edema, neuropathy, T2DM, blindness who presents Emergency Department for evaluation of leg swelling.  Patient arrives from her group home with complaints of leg swelling worsening over the last 2 weeks.  She does have a prescription for Lasix  back in February but she is unsure if she is taking this medicine now.  She states that she did have compression stockings at 1 point but she no longer has these.  Currently denying shortness of breath, chest pain, abdominal pain, nausea, vomiting or other systemic symptoms.   Past Medical History Past Medical History:  Diagnosis Date   Anemia    Blind    Breast cancer (HCC)    Diabetes mellitus without complication (HCC)    Hypertension    MDD (major depressive disorder)    Neuropathy 2010   Schizophrenia [facility])    Patient Active Problem List   Diagnosis Date Noted   Psychiatric disorder 01/02/2023   Acute pyelonephritis 01/02/2023   Peripheral edema 01/02/2023   Hypokalemia 01/02/2023   Sepsis (HCC) 01/01/2023   Callus 07/05/2022   Abscess 05/08/2022   Breast abscess    Dysphagia 03/05/2022   Nausea and vomiting 03/03/2022   Early satiety 03/03/2022   Constipation 03/03/2022   Loss of weight 03/03/2022   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   Acute metabolic encephalopathy 01/17/2021   Uterine mass 01/17/2021   Altered mental status 10/15/2020   Leukocytosis 10/15/2020   AKI (acute kidney injury) (HCC) 10/15/2020   Hyperglycemia due to diabetes mellitus (HCC) 10/15/2020   Hyperlipidemia 10/15/2020   Diabetic neuropathy (HCC) 10/15/2020   UTI (urinary tract infection) 10/15/2019   Encephalopathy acute 10/14/2019   Schizophrenia (HCC) 10/14/2019   Type 2  diabetes mellitus without complication (HCC) 10/14/2019   Acute encephalopathy 10/14/2019   Palpitations 10/04/2016   Essential hypertension 10/04/2016   Tobacco use 10/04/2016   Tachycardia 10/04/2016   Home Medication(s) Prior to Admission medications   Medication Sig Start Date End Date Taking? Authorizing Provider  acetaminophen  (TYLENOL ) 325 MG tablet Take 650 mg by mouth every 6 (six) hours as needed for mild pain or moderate pain.    [provider]  acetaZOLAMIDE  ER (DIAMOX ) 500 MG capsule Take 1 capsule (500 mg total) by mouth 2 (two) times daily. 01/04/23   Colin Dawley, MD  albuterol  (VENTOLIN  HFA) 108 (90 Base) MCG/ACT inhaler Inhale 2 puffs into the lungs every 4 (four) hours as needed for wheezing or shortness of breath.    [provider]  amLODipine  (NORVASC ) 5 MG tablet Take 1 tablet (5 mg total) by mouth daily. 01/04/23   Colin Dawley, MD  aspirin  EC 81 MG tablet Take 1 tablet (81 mg total) by mouth daily with breakfast. 01/04/23 01/04/24  Colin Dawley, MD  atorvastatin  (LIPITOR) 10 MG tablet Take 1 tablet (10 mg total) by mouth every evening. 01/04/23   Colin Dawley, MD  benztropine  (COGENTIN ) 0.5 MG tablet Take 0.5 mg by mouth 2 (two) times daily.    [provider]  cephALEXin  (KEFLEX ) 500 MG capsule Take 1 capsule (500 mg total) by mouth 4 (four) times daily. 10/15/23   Cheyenne Cotta, MD  colchicine  0.6 MG tablet Take 1 tablet (0.6 mg total) by mouth daily. 01/04/23   Colin Dawley, MD  divalproex  (  DEPAKOTE ) 250 MG DR tablet Take 5 tablets (1,250 mg total) by mouth every evening. 12/13/23   Roise Cleaver, NP  fluconazole (DIFLUCAN) 100 MG tablet Take 100 mg by mouth daily. 12/01/23   [provider]  furosemide  (LASIX ) 20 MG tablet Take 20 mg by mouth daily.    [provider]  gabapentin  (NEURONTIN ) 400 MG capsule Take 1 capsule (400 mg total) by mouth 3 (three) times daily. 01/04/23   Colin Dawley, MD   hydrOXYzine  (ATARAX ) 25 MG tablet Take 25 mg by mouth 3 (three) times daily.    [provider]  insulin  glargine (LANTUS ) 100 UNIT/ML injection Inject 0.25 mLs (25 Units total) into the skin at bedtime. 01/04/23   Colin Dawley, MD  LANTUS  SOLOSTAR 100 UNIT/ML Solostar Pen Inject into the skin. 11/30/23   [provider]  levocetirizine (XYZAL ) 5 MG tablet Take 5 mg by mouth daily.    [provider]  metFORMIN  (GLUCOPHAGE ) 1000 MG tablet Take 1 tablet (1,000 mg total) by mouth 2 (two) times daily with a meal. 01/04/23   Emokpae, Courage, MD  NOVOLOG  FLEXPEN 100 UNIT/ML FlexPen Inject into the skin. 10/27/23   [provider]  omeprazole  (PRILOSEC) 40 MG capsule TAKE ONE CAPSULE BY MOUTH ONCE DAILY. NEEDS OFFICE VISIT 05/05/23   Umberto Ganong, Bearl Limes, MD  polyethylene glycol (MIRALAX  / GLYCOLAX ) 17 g packet Take 17 g by mouth daily.    [provider]  potassium chloride  (KLOR-CON ) 10 MEQ tablet Take 1 tablet (10 mEq total) by mouth daily. Take While taking Diamox /acetazolamide  01/04/23   Colin Dawley, MD  QUEtiapine  (SEROQUEL  XR) 400 MG 24 hr tablet Take 2 tablets (800 mg total) by mouth at bedtime. 01/04/23   Colin Dawley, MD  risperiDONE  (RISPERDAL  M-TABS) 3 MG disintegrating tablet Take 1 tablet (3 mg total) by mouth every morning. 01/04/23   Colin Dawley, MD  risperiDONE  (RISPERDAL ) 1 MG tablet Take 0.5 tablets (0.5 mg total) by mouth 2 (two) times daily as needed (agitation). 12/13/23 12/12/24  Roise Cleaver, NP  Semaglutide ,0.25 or 0.5MG /DOS, (OZEMPIC , 0.25 OR 0.5 MG/DOSE,) 2 MG/1.5ML SOPN Inject 0.25 mg into the skin every Friday. 01/06/23   Colin Dawley, MD  sennosides-docusate sodium  (SENOKOT-S) 8.6-50 MG tablet Take 2 tablets by mouth at bedtime. 01/04/23   Emokpae, Courage, MD  SYSTANE ULTRA PF 0.4-0.3 % SOLN Place 2 drops into both eyes in the morning, at noon, and at bedtime. 12/21/22   [provider]  tamoxifen   (NOLVADEX ) 20 MG tablet Take 1 tablet (20 mg total) by mouth daily. 01/04/23   Colin Dawley, MD  tiotropium (SPIRIVA ) 18 MCG inhalation capsule Place 1 capsule (18 mcg total) into inhaler and inhale daily. 01/04/23   Colin Dawley, MD  traMADol  (ULTRAM ) 50 MG tablet Take 1 tablet (50 mg total) by mouth every 8 (eight) hours as needed for severe pain or moderate pain. 01/04/23   Colin Dawley, MD  triamcinolone cream (KENALOG) 0.1 % Apply 1 Application topically. 12/26/19   [provider]  Vitamin D , Ergocalciferol , (DRISDOL ) 1.25 MG (50000 UNIT) CAPS capsule Take 50,000 Units by mouth every Saturday.    [provider]  Past Surgical History Past Surgical History:  Procedure Laterality Date   BIOPSY  04/12/2022   Procedure: BIOPSY;  Surgeon: Urban Garden, MD;  Location: [facility];  Service: Gastroenterology;;   COLONOSCOPY WITH PROPOFOL  N/A 04/12/2022   Procedure: COLONOSCOPY WITH PROPOFOL ;  Surgeon: Urban Garden, MD;  Location: [facility];  Service: Gastroenterology;  Laterality: N/A;  1230 ASA 3 pt at [facility]   ESOPHAGOGASTRODUODENOSCOPY (EGD) WITH PROPOFOL  N/A 04/12/2022   Procedure: ESOPHAGOGASTRODUODENOSCOPY (EGD) WITH PROPOFOL ;  Surgeon: Urban Garden, MD;  Location: [facility];  Service: Gastroenterology;  Laterality: N/A;   INCISION AND DRAINAGE ABSCESS Left 05/08/2022   Procedure: INCISION AND DRAINAGE ABSCESS of left breast abscess;  Surgeon: Marijo Shove, DO;  Location: [facility];  Service: General;  Laterality: Left;   TOE AMPUTATION     Family History Family History  Problem Relation Age of Onset   Hypertension Mother    Cancer Mother    Diabetes Father     Social History Social History   Tobacco Use   Smoking status: Every Day    Current  packs/day: 1.00    Average packs/day: 1 pack/day for 20.0 years (20.0 ttl pk-yrs)    Types: Cigarettes   Smokeless tobacco: Never  Vaping Use   Vaping status: Never Used  Substance Use Topics   Alcohol  use: Not Currently   Drug use: Not Currently   Allergies Ziprasidone  hcl, Penicillinase, Sulfa antibiotics, Sulfamethoxazole-trimethoprim, Penicillins, and Latex  Review of Systems Review of Systems  Cardiovascular:  Positive for leg swelling.    Physical Exam Vital Signs  I have reviewed the triage vital signs BP (!) 143/84   Pulse 97   Temp 98.1 F (36.7 C) (Oral)   Resp 16   Ht 5\' 8"  (1.727 m)   Wt 90.3 kg   SpO2 98%   BMI 30.26 kg/m   Physical Exam Vitals and nursing note reviewed.  Constitutional:      General: She is not in acute distress.    Appearance: She is well-developed.  HENT:     Head: Normocephalic and atraumatic.  Eyes:     Conjunctiva/sclera: Conjunctivae normal.  Cardiovascular:     Rate and Rhythm: Normal rate and regular rhythm.     Heart sounds: No murmur heard. Pulmonary:     Effort: Pulmonary effort is normal. No respiratory distress.     Breath sounds: Normal breath sounds.  Abdominal:     Palpations: Abdomen is soft.     Tenderness: There is no abdominal tenderness.  Musculoskeletal:        General: No swelling.     Cervical back: Neck supple.     Right lower leg: Edema present.     Left lower leg: Edema present.  Skin:    General: Skin is warm and dry.     Capillary Refill: Capillary refill takes less than 2 seconds.  Neurological:     Mental Status: She is alert.  Psychiatric:        Mood and Affect: Mood normal.     ED Results and Treatments Labs (all labs ordered are listed, but only abnormal results are displayed) Labs Reviewed  COMPREHENSIVE METABOLIC PANEL WITH GFR  CBC WITH DIFFERENTIAL/PLATELET  BRAIN NATRIURETIC PEPTIDE  ETHANOL  TROPONIN I (HIGH SENSITIVITY)  Radiology No results found.  Pertinent labs & imaging results that were available during my care of the patient were reviewed by me and considered in my medical decision making (see MDM for details).  Medications Ordered in ED Medications - No data to display                                                                                                                                   Procedures Procedures  (including critical care time)  Medical Decision Making / ED Course   This patient presents to the ED for concern of leg swelling, this involves an extensive number of treatment options, and is a complaint that carries with it a high risk of complications and morbidity.  The differential diagnosis includes peripheral edema, CHF, hypoalbuminemia, gravity dependent edema  MDM: Patient seen emerged part for evaluation of lower extremity edema.  Physical exam with 2+ pitting edema bilaterally but is otherwise unremarkable.  Laboratory evaluation with a hemoglobin of 10.5, CO2 17 but is otherwise unremarkable.  IV Lasix  given.  TED hoses brought and placed on the patient.  Pending completion of laboratory evaluation at time of signout.  Do anticipate discharge and I prescribed additional Lasix  for the patient.   Additional history obtained:  -External records from outside source obtained and reviewed including: Chart review including previous notes, labs, imaging, consultation notes   Lab Tests: -I ordered, reviewed, and interpreted labs.   The pertinent results include:   Labs Reviewed  COMPREHENSIVE METABOLIC PANEL WITH GFR  CBC WITH DIFFERENTIAL/PLATELET  BRAIN NATRIURETIC PEPTIDE  ETHANOL  TROPONIN I (HIGH SENSITIVITY)     Medicines ordered and prescription drug management: No orders of the defined types were placed in this encounter.   -I have reviewed the patients home medicines and have made  adjustments as needed  Critical interventions none   Social Determinants of Health:  Factors impacting patients care include: Lives in a group home   Reevaluation: After the interventions noted above, I reevaluated the patient and found that they have :improved  Co morbidities that complicate the patient evaluation  Past Medical History:  Diagnosis Date   Anemia    Blind    Breast cancer (HCC)    Diabetes mellitus without complication (HCC)    Hypertension    MDD (major depressive disorder)    Neuropathy 2010   Schizophrenia (HCC)       Dispostion: I considered admission for this patient, and disposition pending completion of laboratory evaluation.  Please see provider signout 15 patient workup     Final Clinical Impression(s) / ED Diagnoses Final diagnoses:  None     @PCDICTATION @    Karlyn Overman, MD 12/30/23 2307

## 2023-12-31 NOTE — ED Notes (Signed)
 Spoke to Ty with [facility], they stated transport is in route and should be here shortly to pick up patient.

## 2023-12-31 NOTE — ED Notes (Signed)
 Transport called saying transport may not be available right now for this pt unless another driver steps up to transport.

## 2023-12-31 NOTE — ED Notes (Signed)
 [facility] contacted to take pt back to group home. Transport told to give them 30-40 minutes before coming to get her.

## 2023-12-31 NOTE — ED Notes (Signed)
 Was able to get in touch with [facility], Ty, and she states facility is able to pick pt up--states around 12:30

## 2023-12-31 NOTE — ED Notes (Signed)
 Facility updated on pt discharge. Facility said no one would be able to pick pt up until around lunch time tomorrow. This nurse asked if transport was provided would that be okay and would someone be there to receive the pt. The facility said that would be okay.

## 2023-12-31 NOTE — ED Notes (Signed)
 Crackers and a drink given to patient.

## 2024-01-23 IMAGING — DX DG ABDOMEN ACUTE W/ 1V CHEST
4 series · 4 of 4 positions shown · non-contrast
Comparison: None.

CLINICAL DATA: Constipation.

EXAM:
DG ABDOMEN ACUTE WITH 1 VIEW CHEST

[abdomen erect ap]
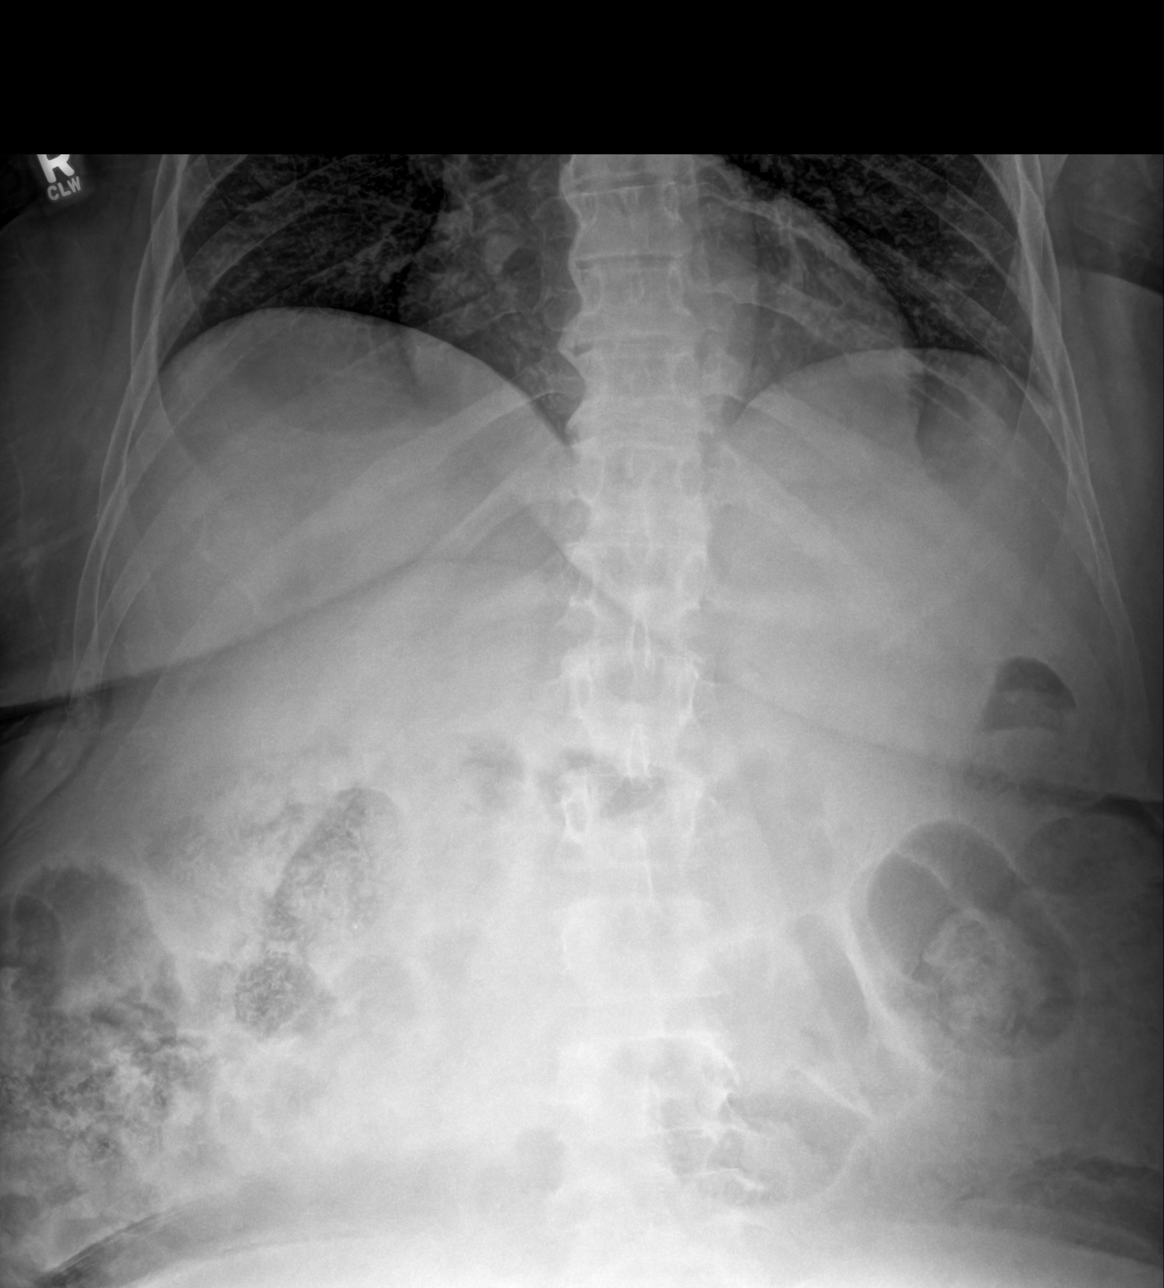

[abdomen supine (1 of 2)]
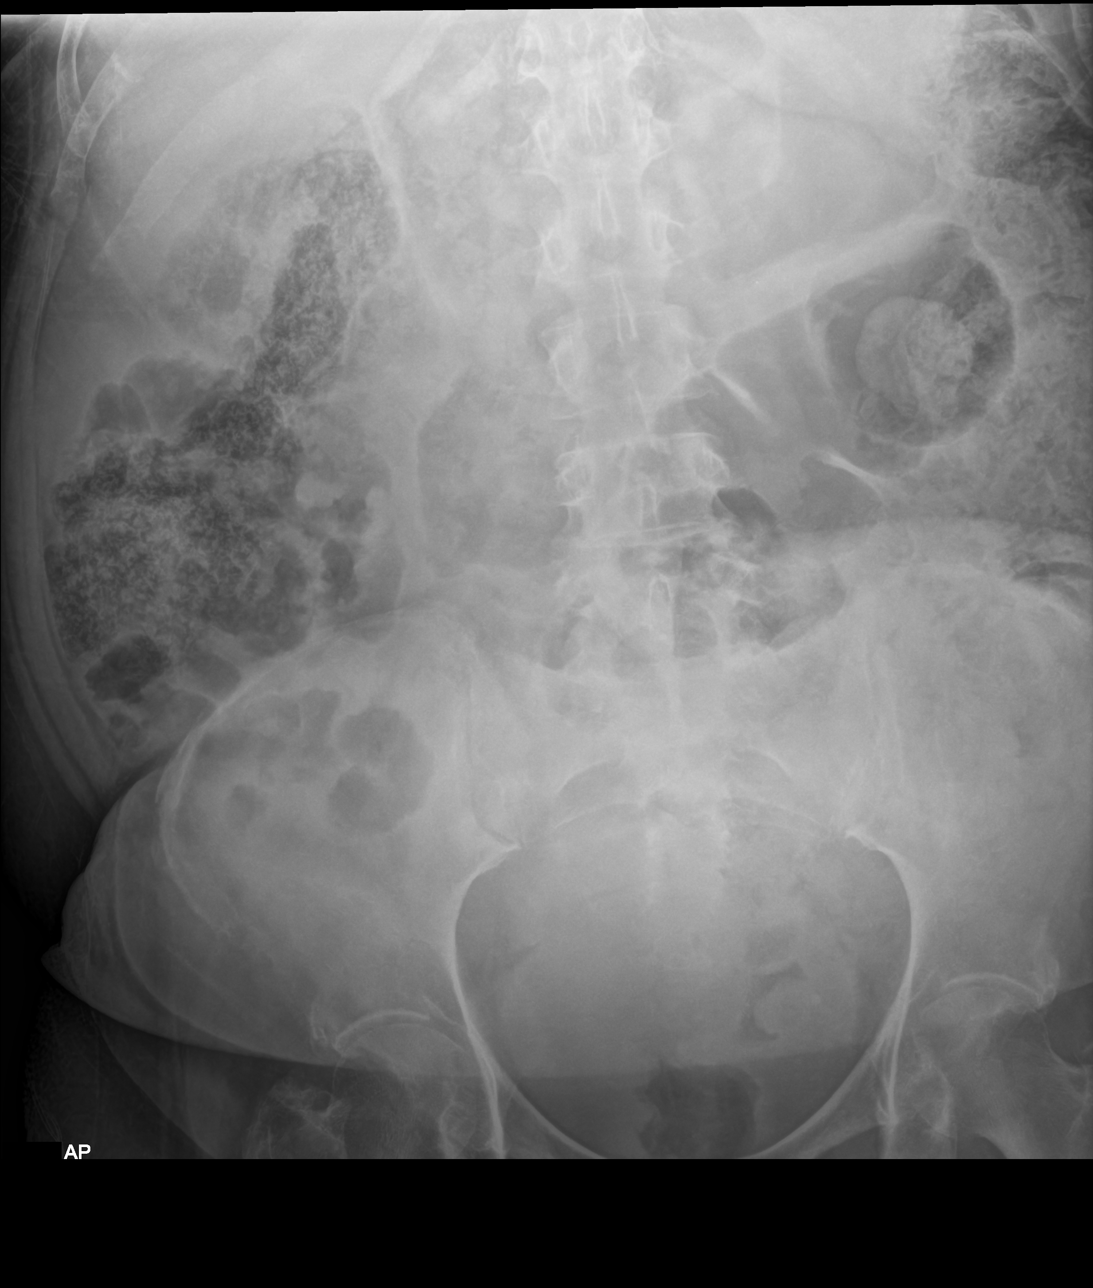

[abdomen supine (2 of 2)]
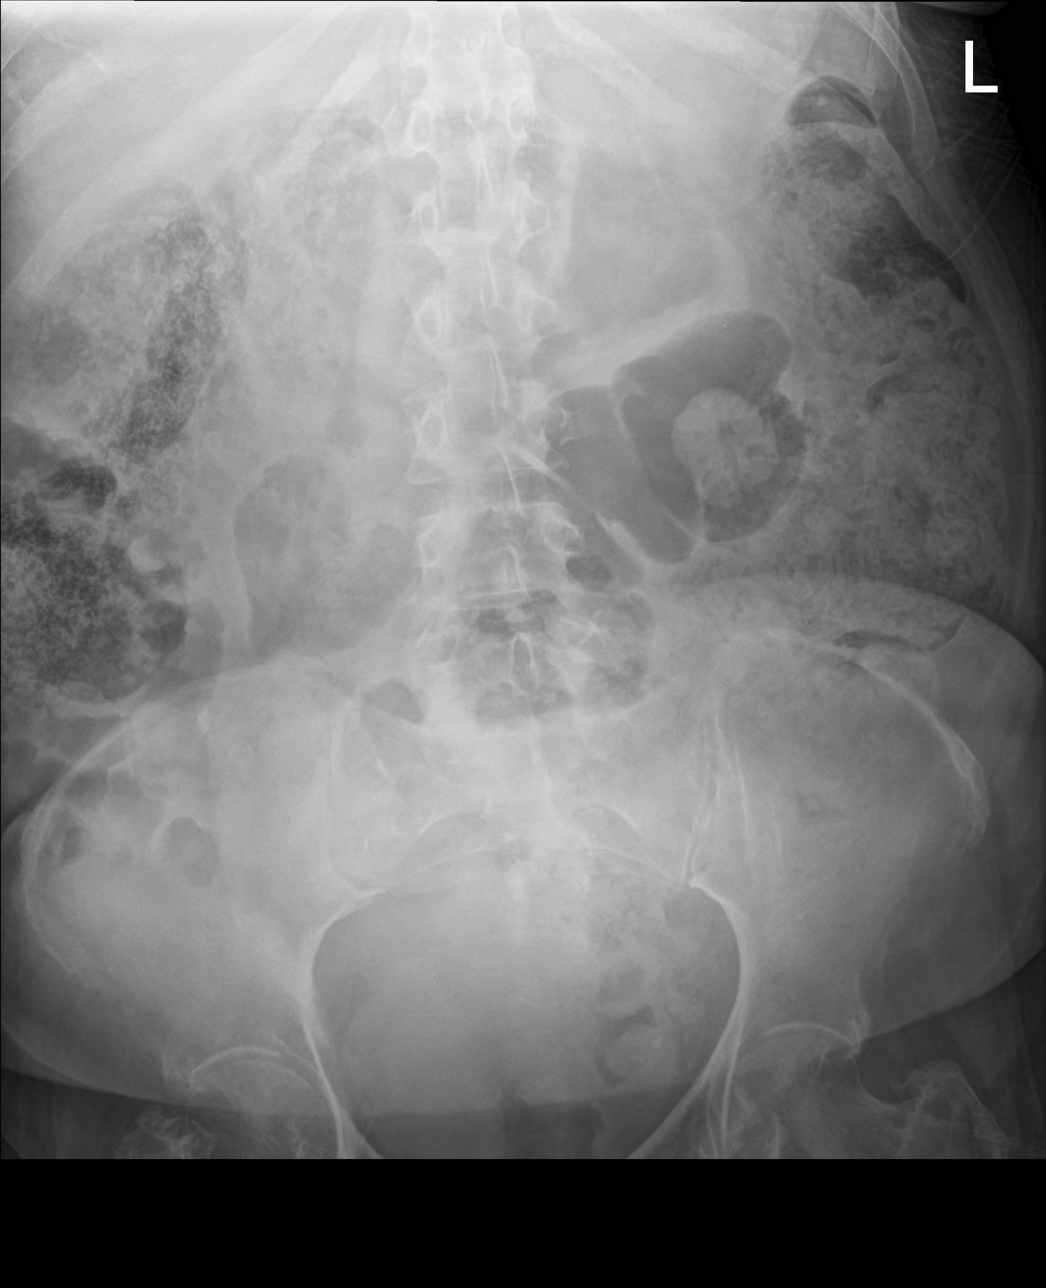

[chest ap]
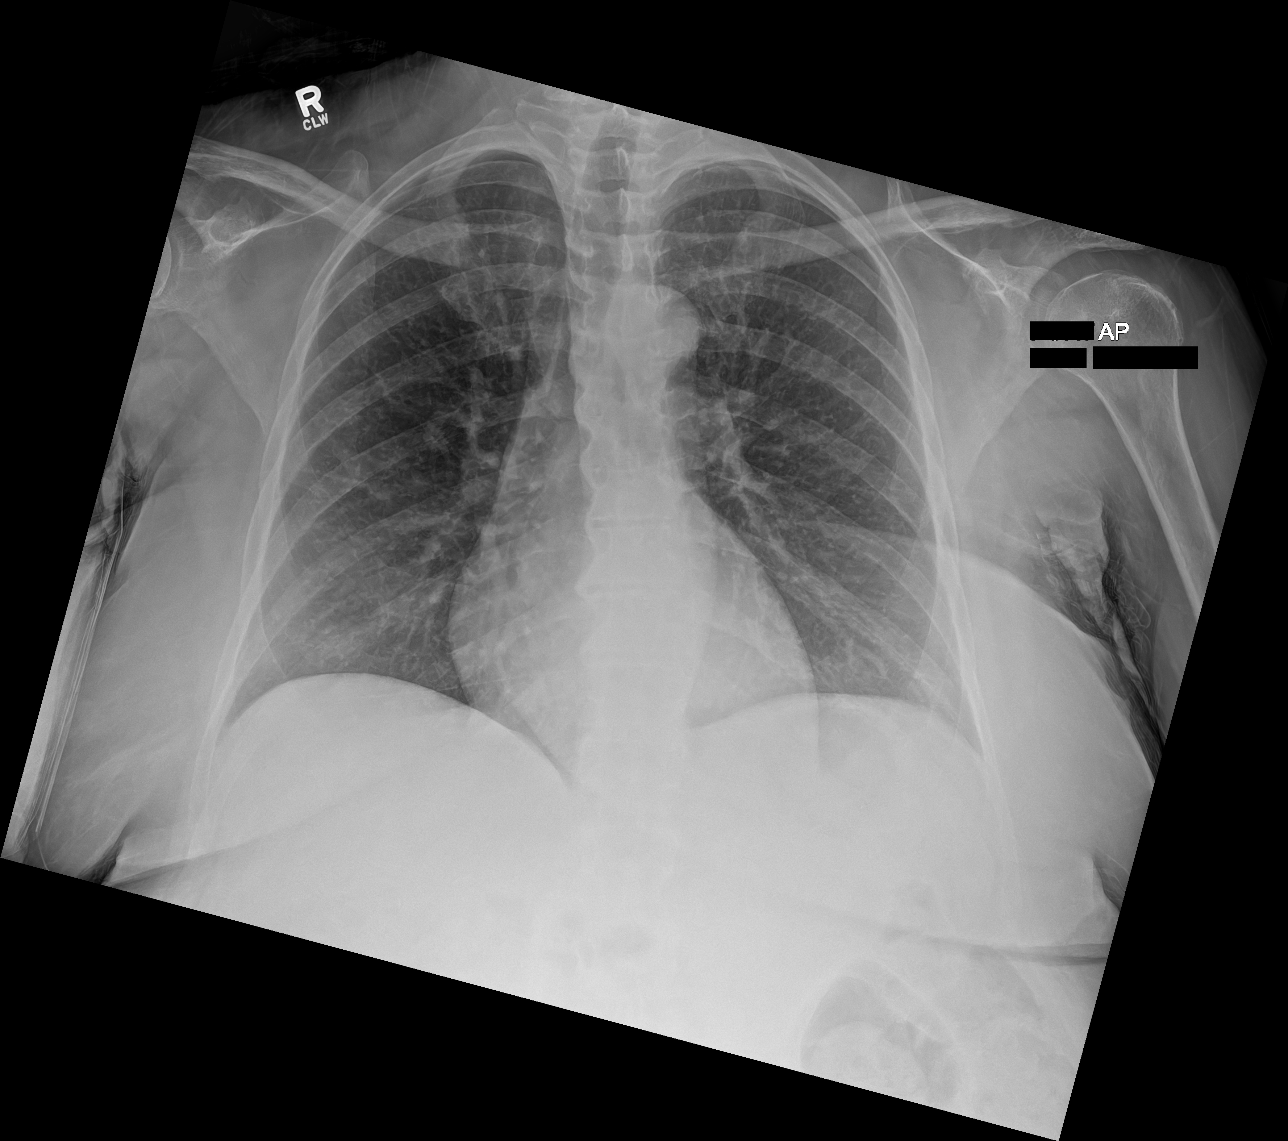

[4 of 4 positions shown; findings below may reference images not displayed]

FINDINGS: There is no evidence of dilated bowel loops or free intraperitoneal
air. There is a moderate to large stool burden identified within the
ascending and descending colon as well as the sigmoid colon. No
radiopaque calculi or other significant radiographic abnormality is
seen. Heart size and mediastinal contours are within normal limits.
Both lungs are clear.
IMPRESSION: 1. Nonobstructive bowel gas pattern.
2. Moderate to large stool burden within the colon compatible with
constipation.

## 2024-03-11 ENCOUNTER — Ambulatory Visit: Payer: MEDICAID | Admitting: Podiatry

## 2024-03-12 ENCOUNTER — Encounter: Payer: Self-pay | Admitting: Podiatry

## 2024-03-12 ENCOUNTER — Ambulatory Visit (INDEPENDENT_AMBULATORY_CARE_PROVIDER_SITE_OTHER): Payer: MEDICAID | Admitting: Podiatry

## 2024-03-12 DIAGNOSIS — M79675 Pain in left toe(s): Secondary | ICD-10-CM | POA: Diagnosis not present

## 2024-03-12 DIAGNOSIS — E119 Type 2 diabetes mellitus without complications: Secondary | ICD-10-CM

## 2024-03-12 DIAGNOSIS — B351 Tinea unguium: Secondary | ICD-10-CM

## 2024-03-12 DIAGNOSIS — L84 Corns and callosities: Secondary | ICD-10-CM

## 2024-03-12 DIAGNOSIS — N179 Acute kidney failure, unspecified: Secondary | ICD-10-CM

## 2024-03-12 DIAGNOSIS — M79674 Pain in right toe(s): Secondary | ICD-10-CM

## 2024-03-12 NOTE — Progress Notes (Signed)
 This patient returns to my office for at risk foot care.  This patient requires this care by a professional since this patient will be at risk due to having diabetic neuropathy and amputation second toe left foot. This patient is unable to cut nails herself since the patient cannot reach her nails.These nails are painful walking and wearing shoes.  This patient presents for at risk foot care today.  General Appearance  Alert, conversant and in no acute stress.  Vascular  Dorsalis pedis and posterior tibial  pulses are palpable  bilaterally.  Capillary return is within normal limits  bilaterally. Temperature is within normal limits  bilaterally.  Neurologic  Senn-Weinstein monofilament wire test within normal limits  bilaterally. Muscle power within normal limits bilaterally.  Nails Thick disfigured discolored nails with subungual debris  from hallux to fifth toes bilaterally. No evidence of bacterial infection or drainage bilaterally.  Orthopedic  No limitations of motion  feet .  No crepitus or effusions noted.  No bony pathology or digital deformities noted.  Amputation second toe left foot.  No motion 1st MPJ left foot.  Skin  normotropic skin with no porokeratosis noted bilaterally.  No signs of infections or ulcers noted.   Symptomatic callus left foot under tibial sesamoid.  Onychomycosis  Pain in right toes  Pain in left toes  Consent was obtained for treatment procedures.   Mechanical debridement of nails 1-5  bilaterally performed with a nail nipper.  Filed with dremel without incident. Callus de brided with dremel tool.   Return office visit   3 months                   Told patient to return for periodic foot care and evaluation due to potential at risk complications.    Cordella Bold DPM

## 2024-04-06 ENCOUNTER — Encounter (HOSPITAL_COMMUNITY): Payer: Self-pay | Admitting: Emergency Medicine

## 2024-04-06 ENCOUNTER — Emergency Department (HOSPITAL_COMMUNITY): Payer: MEDICAID

## 2024-04-06 ENCOUNTER — Emergency Department (HOSPITAL_COMMUNITY)
Admission: EM | Admit: 2024-04-06 | Discharge: 2024-04-06 | Disposition: A | Discharge status: Home / Self Care | Payer: MEDICAID | Attending: Emergency Medicine | Admitting: Emergency Medicine

## 2024-04-06 ENCOUNTER — Other Ambulatory Visit: Payer: Self-pay

## 2024-04-06 DIAGNOSIS — N12 Tubulo-interstitial nephritis, not specified as acute or chronic: Secondary | ICD-10-CM | POA: Diagnosis not present

## 2024-04-06 DIAGNOSIS — R109 Unspecified abdominal pain: Secondary | ICD-10-CM | POA: Diagnosis present

## 2024-04-06 DIAGNOSIS — Z794 Long term (current) use of insulin: Secondary | ICD-10-CM | POA: Diagnosis not present

## 2024-04-06 DIAGNOSIS — R739 Hyperglycemia, unspecified: Secondary | ICD-10-CM | POA: Diagnosis not present

## 2024-04-06 DIAGNOSIS — Z91040 Latex allergy status: Secondary | ICD-10-CM | POA: Insufficient documentation

## 2024-04-06 DIAGNOSIS — Z7984 Long term (current) use of oral hypoglycemic drugs: Secondary | ICD-10-CM | POA: Insufficient documentation

## 2024-04-06 LAB — CBC WITH DIFFERENTIAL/PLATELET
Abs Immature Granulocytes: 0.05 K/uL (ref 0.00–0.07)
Basophils Absolute: 0 K/uL (ref 0.0–0.1)
Basophils Relative: 1 %
Eosinophils Absolute: 0.3 K/uL (ref 0.0–0.5)
Eosinophils Relative: 5 %
HCT: 39.9 % (ref 36.0–46.0)
Hemoglobin: 12.4 g/dL (ref 12.0–15.0)
Immature Granulocytes: 1 %
Lymphocytes Relative: 21 %
Lymphs Abs: 1.1 K/uL (ref 0.7–4.0)
MCH: 28.9 pg (ref 26.0–34.0)
MCHC: 31.1 g/dL (ref 30.0–36.0)
MCV: 93 fL (ref 80.0–100.0)
Monocytes Absolute: 0.6 K/uL (ref 0.1–1.0)
Monocytes Relative: 11 %
Neutro Abs: 3.4 K/uL (ref 1.7–7.7)
Neutrophils Relative %: 61 %
Platelets: 141 K/uL — ABNORMAL LOW (ref 150–400)
RBC: 4.29 MIL/uL (ref 3.87–5.11)
RDW: 14.1 % (ref 11.5–15.5)
WBC: 5.4 K/uL (ref 4.0–10.5)
nRBC: 0 % (ref 0.0–0.2)

## 2024-04-06 LAB — COMPREHENSIVE METABOLIC PANEL WITH GFR
ALT: 15 U/L (ref 0–44)
AST: 14 U/L — ABNORMAL LOW (ref 15–41)
Albumin: 3 g/dL — ABNORMAL LOW (ref 3.5–5.0)
Alkaline Phosphatase: 85 U/L (ref 38–126)
Anion gap: 8 (ref 5–15)
BUN: 21 mg/dL — ABNORMAL HIGH (ref 6–20)
CO2: 22 mmol/L (ref 22–32)
Calcium: 9 mg/dL (ref 8.9–10.3)
Chloride: 108 mmol/L (ref 98–111)
Creatinine, Ser: 0.81 mg/dL (ref 0.44–1.00)
GFR, Estimated: >60 mL/min (ref >60)
Glucose, Bld: 212 mg/dL — ABNORMAL HIGH (ref 70–99)
Potassium: 3.5 mmol/L (ref 3.5–5.1)
Sodium: 138 mmol/L (ref 135–145)
Total Bilirubin: <0.2 mg/dL (ref 0.0–1.2)
Total Protein: 7 g/dL (ref 6.5–8.1)

## 2024-04-06 LAB — URINALYSIS, ROUTINE W REFLEX MICROSCOPIC
Bilirubin Urine: NEGATIVE
Glucose, UA: NEGATIVE mg/dL
Hgb urine dipstick: NEGATIVE
Ketones, ur: NEGATIVE mg/dL
Nitrite: NEGATIVE
Protein, ur: NEGATIVE mg/dL
Specific Gravity, Urine: 1.014 (ref 1.005–1.030)
pH: 7 (ref 5.0–8.0)

## 2024-04-06 LAB — CBG MONITORING, ED: Glucose-Capillary: 206 mg/dL — ABNORMAL HIGH (ref 70–99)

## 2024-04-06 LAB — LIPASE, BLOOD: Lipase: 31 U/L (ref 11–51)

## 2024-04-06 MED ORDER — IOHEXOL 300 MG/ML  SOLN
100.0000 mL | Freq: Once | INTRAMUSCULAR | Status: AC | PRN
  Administered 2024-04-06: 100 mL via INTRAVENOUS

## 2024-04-06 MED ORDER — SODIUM CHLORIDE 0.9 % IV BOLUS
1000.0000 mL | Freq: Once | INTRAVENOUS | Status: AC
  Administered 2024-04-06: 1000 mL via INTRAVENOUS

## 2024-04-06 MED ORDER — SODIUM CHLORIDE 0.9 % IV SOLN
1.0000 g | Freq: Once | INTRAVENOUS | Status: AC
  Administered 2024-04-06: 1 g via INTRAVENOUS
  Filled 2024-04-06: qty 10

## 2024-04-06 MED ORDER — ONDANSETRON HCL 4 MG/2ML IJ SOLN
4.0000 mg | Freq: Once | INTRAMUSCULAR | Status: AC
  Administered 2024-04-06: 4 mg via INTRAVENOUS
  Filled 2024-04-06: qty 2

## 2024-04-06 MED ORDER — DICYCLOMINE HCL 10 MG/ML IM SOLN
20.0000 mg | Freq: Once | INTRAMUSCULAR | Status: AC
  Administered 2024-04-06: 20 mg via INTRAMUSCULAR
  Filled 2024-04-06: qty 2

## 2024-04-06 MED ORDER — CEPHALEXIN 500 MG PO CAPS: 500.0000 mg | ORAL_CAPSULE | Freq: Three times a day (TID) | ORAL | 0 refills | Status: AC

## 2024-04-06 NOTE — ED Provider Notes (Signed)
 [place] EMERGENCY DEPARTMENT AT [facility] Provider Note   CSN: [id number] Arrival date & time: 04/06/24  1259     Patient presents with: Abdominal Pain   Regina Watson is a 58 y.o. female.   Patient is a 58 year old female who presents to the emergency department the chief complaint of abdominal pain and back pain which has been ongoing for approximate the past 3 days.  She notes that the pain is worse in the lower abdomen.  She denies any active dysuria hematuria at this point.  She notes that she has had associated nausea without vomiting.  She denies any constipation or diarrhea.  She denies any associated chest pain, shortness of breath.  There is been no dizziness, lightheadedness or syncope.  She denies any recent falls or blunt abdominal wall trauma.   Abdominal Pain      Prior to Admission medications   Medication Sig Start Date End Date Taking? Authorizing Provider  cephALEXin  (KEFLEX ) 500 MG capsule Take 1 capsule (500 mg total) by mouth 3 (three) times daily for 7 days. 04/06/24 04/13/24 Yes Daralene Bruckner D, PA-C  acetaminophen  (TYLENOL ) 325 MG tablet Take 650 mg by mouth every 6 (six) hours as needed for mild pain or moderate pain.    [provider]  acetaZOLAMIDE  ER (DIAMOX ) 500 MG capsule Take 1 capsule (500 mg total) by mouth 2 (two) times daily. 01/04/23   Pearlean Manus, MD  albuterol  (VENTOLIN  HFA) 108 (90 Base) MCG/ACT inhaler Inhale 2 puffs into the lungs every 4 (four) hours as needed for wheezing or shortness of breath.    [provider]  amLODipine  (NORVASC ) 5 MG tablet Take 1 tablet (5 mg total) by mouth daily. 01/04/23   Pearlean Manus, MD  atorvastatin  (LIPITOR) 10 MG tablet Take 1 tablet (10 mg total) by mouth every evening. 01/04/23   Pearlean Manus, MD  benztropine  (COGENTIN ) 0.5 MG tablet Take 0.5 mg by mouth 2 (two) times daily.    [provider]  colchicine  0.6 MG tablet Take 1 tablet (0.6 mg total)  by mouth daily. 01/04/23   Pearlean Manus, MD  divalproex  (DEPAKOTE ) 250 MG DR tablet Take 5 tablets (1,250 mg total) by mouth every evening. 12/13/23   Mardy Legacy, NP  fluconazole (DIFLUCAN) 100 MG tablet Take 100 mg by mouth daily. 12/01/23   [provider]  furosemide  (LASIX ) 20 MG tablet Take 1 tablet (20 mg total) by mouth 2 (two) times daily. 12/30/23 01/29/24  Kommor, [place], MD  gabapentin  (NEURONTIN ) 400 MG capsule Take 1 capsule (400 mg total) by mouth 3 (three) times daily. 01/04/23   Pearlean Manus, MD  hydrOXYzine  (ATARAX ) 25 MG tablet Take 25 mg by mouth 3 (three) times daily.    [provider]  insulin  glargine (LANTUS ) 100 UNIT/ML injection Inject 0.25 mLs (25 Units total) into the skin at bedtime. 01/04/23   Pearlean Manus, MD  LANTUS  SOLOSTAR 100 UNIT/ML Solostar Pen Inject into the skin. 11/30/23   [provider]  levocetirizine (XYZAL ) 5 MG tablet Take 5 mg by mouth daily.    [provider]  metFORMIN  (GLUCOPHAGE ) 1000 MG tablet Take 1 tablet (1,000 mg total) by mouth 2 (two) times daily with a meal. 01/04/23   Emokpae, Courage, MD  NOVOLOG  FLEXPEN 100 UNIT/ML FlexPen Inject into the skin. 10/27/23   [provider]  omeprazole  (PRILOSEC) 40 MG capsule TAKE ONE CAPSULE BY MOUTH ONCE DAILY. NEEDS OFFICE VISIT 05/05/23   Eartha Angelia Sieving, MD  polyethylene glycol (MIRALAX  / GLYCOLAX ) 17 g packet Take 17 g by mouth daily.    [provider]  potassium chloride  (KLOR-CON ) 10 MEQ tablet Take 1 tablet (10 mEq total) by mouth daily. Take While taking Diamox /acetazolamide  01/04/23   Pearlean Manus, MD  QUEtiapine  (SEROQUEL  XR) 400 MG 24 hr tablet Take 2 tablets (800 mg total) by mouth at bedtime. 01/04/23   Pearlean Manus, MD  risperiDONE  (RISPERDAL  M-TABS) 3 MG disintegrating tablet Take 1 tablet (3 mg total) by mouth every morning. 01/04/23   Pearlean Manus, MD  risperiDONE  (RISPERDAL ) 1 MG tablet Take 0.5 tablets (0.5  mg total) by mouth 2 (two) times daily as needed (agitation). 12/13/23 12/12/24  Mardy Legacy, NP  Semaglutide ,0.25 or 0.5MG /DOS, (OZEMPIC , 0.25 OR 0.5 MG/DOSE,) 2 MG/1.5ML SOPN Inject 0.25 mg into the skin every Friday. 01/06/23   Pearlean Manus, MD  sennosides-docusate sodium  (SENOKOT-S) 8.6-50 MG tablet Take 2 tablets by mouth at bedtime. 01/04/23   Emokpae, Courage, MD  SYSTANE ULTRA PF 0.4-0.3 % SOLN Place 2 drops into both eyes in the morning, at noon, and at bedtime. 12/21/22   [provider]  tamoxifen  (NOLVADEX ) 20 MG tablet Take 1 tablet (20 mg total) by mouth daily. 01/04/23   Pearlean Manus, MD  tiotropium (SPIRIVA ) 18 MCG inhalation capsule Place 1 capsule (18 mcg total) into inhaler and inhale daily. 01/04/23   Pearlean Manus, MD  traMADol  (ULTRAM ) 50 MG tablet Take 1 tablet (50 mg total) by mouth every 8 (eight) hours as needed for severe pain or moderate pain. 01/04/23   Pearlean Manus, MD  triamcinolone cream (KENALOG) 0.1 % Apply 1 Application topically. 12/26/19   [provider]  Vitamin D , Ergocalciferol , (DRISDOL ) 1.25 MG (50000 UNIT) CAPS capsule Take 50,000 Units by mouth every Saturday.    [provider]    Allergies: Ziprasidone  hcl, Penicillinase, Sulfa antibiotics, Sulfamethoxazole-trimethoprim, Penicillins, and Latex    Review of Systems  Gastrointestinal:  Positive for abdominal pain.  All other systems reviewed and are negative.   Updated Vital Signs BP (!) 164/94   Pulse 89   Temp 98.4 F (36.9 C) (Oral)   Resp 20   Ht 5' 8 (1.727 m)   Wt 90.7 kg   SpO2 100%   BMI 30.41 kg/m   Physical Exam Vitals and nursing note reviewed.  Constitutional:      Appearance: Normal appearance.  HENT:     Head: Normocephalic and atraumatic.     Nose: Nose normal.     Mouth/Throat:     Mouth: Mucous membranes are moist.  Eyes:     Extraocular Movements: Extraocular movements intact.     Conjunctiva/sclera: Conjunctivae normal.      Pupils: Pupils are equal, round, and reactive to light.  Cardiovascular:     Rate and Rhythm: Normal rate and regular rhythm.     Pulses: Normal pulses.     Heart sounds: Normal heart sounds. No murmur heard.    No gallop.  Pulmonary:     Effort: Pulmonary effort is normal. No respiratory distress.     Breath sounds: Normal breath sounds. No stridor. No wheezing, rhonchi or rales.  Abdominal:     General: Abdomen is flat. Bowel sounds are normal. There is no distension. There are no signs of injury.     Palpations: Abdomen is soft.     Tenderness: There is generalized abdominal tenderness. Negative signs include Murphy's sign and McBurney's sign.  Musculoskeletal:  General: Normal range of motion.     Cervical back: Normal range of motion and neck supple.  Skin:    General: Skin is warm and dry.  Neurological:     General: No focal deficit present.     Mental Status: She is alert and oriented to person, place, and time. Mental status is at baseline.  Psychiatric:        Mood and Affect: Mood normal.        Behavior: Behavior normal.        Thought Content: Thought content normal.        Judgment: Judgment normal.     (all labs ordered are listed, but only abnormal results are displayed) Labs Reviewed  COMPREHENSIVE METABOLIC PANEL WITH GFR - Abnormal; Notable for the following components:      Result Value   Glucose, Bld 212 (*)    BUN 21 (*)    Albumin 3.0 (*)    AST 14 (*)    All other components within normal limits  CBC WITH DIFFERENTIAL/PLATELET - Abnormal; Notable for the following components:   Platelets 141 (*)    All other components within normal limits  URINALYSIS, ROUTINE W REFLEX MICROSCOPIC - Abnormal; Notable for the following components:   APPearance HAZY (*)    Leukocytes,Ua LARGE (*)    Bacteria, UA MANY (*)    All other components within normal limits  CBG MONITORING, ED - Abnormal; Notable for the following components:   Glucose-Capillary 206  (*)    All other components within normal limits  URINE CULTURE  LIPASE, BLOOD    EKG: None  Radiology: CT ABDOMEN PELVIS W CONTRAST Result Date: 04/06/2024 CLINICAL DATA:  Abdominal pain and back pain, acute, nonlocalized. EXAM: CT ABDOMEN AND PELVIS WITH CONTRAST TECHNIQUE: Multidetector CT imaging of the abdomen and pelvis was performed using the standard protocol following bolus administration of intravenous contrast. RADIATION DOSE REDUCTION: This exam was performed according to the departmental dose-optimization program which includes automated exposure control, adjustment of the mA and/or kV according to patient size and/or use of iterative reconstruction technique. CONTRAST:  OMNIPAQUE  IOHEXOL  300 MG/ML  SOLN COMPARISON:  12/31/2022. FINDINGS: Lower chest: Coronary artery calcifications are noted. No acute abnormality. Hepatobiliary: No focal liver abnormality is seen. No gallstones, gallbladder wall thickening, or biliary dilatation. Pancreas: Unremarkable. No pancreatic ductal dilatation or surrounding inflammatory changes. Spleen: Normal in size without focal abnormality. Adrenals/Urinary Tract: The adrenal glands are within normal limits. The right kidney enhances normally. There is patchy hypoenhancement of the left kidney. No renal calculus or obstructive uropathy bilaterally. Air is identified in the nondependent portion of the urinary bladder with possible herniation of a small portion of the bladder into the low anterior abdominal wall, not well evaluated due to motion artifact. Stomach/Bowel: The stomach is within normal limits. No bowel obstruction, free air, or pneumatosis is seen. A moderate amount of retained stool is present in the colon. Evaluation of the bowel is limited due to motion artifact. Appendix appears normal. Vascular/Lymphatic: Aortic atherosclerosis. No enlarged abdominal or pelvic lymph nodes. Reproductive: There is a stable exophytic fibroid along the right  side of the uterus. No adnexal lesion is identified. Other: No abdominopelvic ascites. Musculoskeletal: Degenerative changes are present in the thoracolumbar spine. No acute osseous abnormality is seen. IMPRESSION: 1. Patchy hypoenhancement of the left kidney suggesting pyelonephritis. No renal calculus or obstructive uropathy is seen. 2. Questionable low anterior abdominal wall hernia containing a small portion of the bladder,  not well evaluated due to motion artifact. 3. Moderate amount of retained stool in the colon suggesting constipation. 4. Stable uterine fibroid. 5. Aortic atherosclerosis and coronary artery calcifications. Electronically Signed   By: Leita Birmingham M.D.   On: 04/06/2024 15:20     Procedures   Medications Ordered in the ED  dicyclomine  (BENTYL ) injection 20 mg (20 mg Intramuscular Given 04/06/24 1432)  sodium chloride  0.9 % bolus 1,000 mL (0 mLs Intravenous Stopped 04/06/24 1625)  ondansetron  (ZOFRAN ) injection 4 mg (4 mg Intravenous Given 04/06/24 1429)  iohexol  (OMNIPAQUE ) 300 MG/ML solution 100 mL (100 mLs Intravenous Contrast Given 04/06/24 1456)  cefTRIAXone  (ROCEPHIN ) 1 g in sodium chloride  0.9 % 100 mL IVPB (1 g Intravenous New Bag/Given 04/06/24 1707)                                    Medical Decision Making Amount and/or Complexity of Data Reviewed Labs: ordered. Radiology: ordered.  Risk Prescription drug management.   This patient presents to the ED for concern of abdominal pain, back pain differential diagnosis includes acute appendicitis, cholecystitis, small obstruction, diverticulitis, ovarian torsion or cyst, PID, tumor and abscess, pyelonephritis, kidney stone, pancreatitis, mesenteric ischemia    Additional history obtained:  Additional history obtained from medical records External records from outside source obtained and reviewed including medical records   Lab Tests:  I Ordered, and personally interpreted labs.  The pertinent results  include: No leukocytosis, no anemia, hyperglycemia noted, normal electrolytes, normal liver function, urinalysis with large leukocytes and many bacteria, negative lipase   Imaging Studies ordered:  I ordered imaging studies including CT scan abdomen and pelvis I independently visualized and interpreted imaging which showed patchy hypodensity of left kidney consistent with pyelonephritis I agree with the radiologist interpretation   Medicines ordered and prescription drug management:  I ordered medication including Rocephin , Bentyl , Zofran , IV fluids for pyelonephritis Reevaluation of the patient after these medicines showed that the patient improved I have reviewed the patients home medicines and have made adjustments as needed   Problem List / ED Course:  Patient is doing well at this time and is stable for discharge home.  Patient symptoms have greatly improved with treatment in the emergency department.  Discussed with patient that imaging and urinalysis is consistent with pyelonephritis.  Will send urine for culture at this time.  Will cover accordingly with antibiotics per her previous urine cultures.  Do not suspect that admission is warranted at this time.  Vital signs are stable with no indication for sepsis.  Blood work is otherwise unremarkable as well.  She is tolerating p.o. intake without difficulty.  Close follow-up with PCP was discussed as well as strict turn precautions for any new or worsening symptoms.  Patient voiced understand to the plan and had no additional questions.   Social Determinants of Health:  None        Final diagnoses:  Pyelonephritis    ED Discharge Orders          Ordered    cephALEXin  (KEFLEX ) 500 MG capsule  3 times daily        04/06/24 1711               Daralene Lonni JONETTA DEVONNA 04/06/24 1751    Suzette Pac, MD 04/07/24 [phone]

## 2024-04-06 NOTE — ED Notes (Signed)
 Unable to reach legal guardian. Group home supervisor accepted report and reported pt will be fine to transport home with [facility]. [facility] has been contacted and will transport pt home.

## 2024-04-06 NOTE — ED Triage Notes (Signed)
 Per [facility] called out to group home for abdominal pain and back pain. Hx of schizophrenia, diabetes and CHF. CBG 290. Unsure of patients baseline due to staff stating its my first. Staff states patient went through a whole pack of cigarettes today.

## 2024-04-06 NOTE — Discharge Instructions (Addendum)
 Please take all antibiotics as directed.  Return to the emergency department immediately for any new or worsening symptoms.

## 2024-04-08 LAB — URINE CULTURE

## 2024-04-15 ENCOUNTER — Ambulatory Visit
Admission: EM | Admit: 2024-04-15 | Discharge: 2024-04-15 | Disposition: A | Discharge status: Home / Self Care | Payer: MEDICAID | Attending: Nurse Practitioner | Admitting: Nurse Practitioner

## 2024-04-15 DIAGNOSIS — T86829 Unspecified complication of skin graft (allograft) (autograft): Secondary | ICD-10-CM | POA: Diagnosis not present

## 2024-04-15 MED ORDER — TRIAMCINOLONE ACETONIDE 0.1 % EX CREA: 1.0000 | TOPICAL_CREAM | Freq: Two times a day (BID) | CUTANEOUS | 0 refills | Status: AC

## 2024-04-15 MED ORDER — DOXYCYCLINE HYCLATE 100 MG PO TABS: 100.0000 mg | ORAL_TABLET | Freq: Two times a day (BID) | ORAL | 0 refills | Status: AC
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
## 2024-04-15 NOTE — Discharge Instructions (Signed)
 Take medication as prescribed. You may take over-the-counter Tylenol  as needed for pain, fever, or general discomfort. Keep the area clean and dry.  Recommend cleaning the area daily with warm soap and water. Do not scratch or disrupt the area while symptoms persist. If symptoms fail to improve, recommend follow-up with your primary care physician for further evaluation. Follow-up as needed.

## 2024-04-15 NOTE — ED Triage Notes (Signed)
 Per caregiver, pt had a Skin graft area on her lower left leg x 1 week. States it is oozing.

## 2024-05-25 ENCOUNTER — Emergency Department (HOSPITAL_COMMUNITY)
Admission: EM | Admit: 2024-05-25 | Discharge: 2024-05-26 | Disposition: A | Discharge status: Home / Self Care | Payer: MEDICAID | Source: Other Acute Inpatient Hospital | Attending: Emergency Medicine | Admitting: Emergency Medicine

## 2024-05-25 ENCOUNTER — Encounter (HOSPITAL_COMMUNITY): Payer: Self-pay

## 2024-05-25 ENCOUNTER — Other Ambulatory Visit: Payer: Self-pay

## 2024-05-25 ENCOUNTER — Emergency Department (HOSPITAL_COMMUNITY): Payer: MEDICAID

## 2024-05-25 DIAGNOSIS — N3000 Acute cystitis without hematuria: Secondary | ICD-10-CM | POA: Diagnosis not present

## 2024-05-25 DIAGNOSIS — Z7984 Long term (current) use of oral hypoglycemic drugs: Secondary | ICD-10-CM | POA: Diagnosis not present

## 2024-05-25 DIAGNOSIS — R300 Dysuria: Secondary | ICD-10-CM | POA: Diagnosis present

## 2024-05-25 DIAGNOSIS — Z79899 Other long term (current) drug therapy: Secondary | ICD-10-CM | POA: Insufficient documentation

## 2024-05-25 DIAGNOSIS — Z853 Personal history of malignant neoplasm of breast: Secondary | ICD-10-CM | POA: Diagnosis not present

## 2024-05-25 DIAGNOSIS — E119 Type 2 diabetes mellitus without complications: Secondary | ICD-10-CM | POA: Diagnosis not present

## 2024-05-25 DIAGNOSIS — I10 Essential (primary) hypertension: Secondary | ICD-10-CM | POA: Diagnosis not present

## 2024-05-25 DIAGNOSIS — Z794 Long term (current) use of insulin: Secondary | ICD-10-CM | POA: Diagnosis not present

## 2024-05-25 DIAGNOSIS — Z91040 Latex allergy status: Secondary | ICD-10-CM | POA: Diagnosis not present

## 2024-05-25 LAB — CBC WITH DIFFERENTIAL/PLATELET
Abs Immature Granulocytes: 0.04 K/uL (ref 0.00–0.07)
Basophils Absolute: 0 K/uL (ref 0.0–0.1)
Basophils Relative: 0 %
Eosinophils Absolute: 0.2 K/uL (ref 0.0–0.5)
Eosinophils Relative: 5 %
HCT: 43.1 % (ref 36.0–46.0)
Hemoglobin: 13.3 g/dL (ref 12.0–15.0)
Immature Granulocytes: 1 %
Lymphocytes Relative: 27 %
Lymphs Abs: 1.3 K/uL (ref 0.7–4.0)
MCH: 29.3 pg (ref 26.0–34.0)
MCHC: 30.9 g/dL (ref 30.0–36.0)
MCV: 94.9 fL (ref 80.0–100.0)
Monocytes Absolute: 0.4 K/uL (ref 0.1–1.0)
Monocytes Relative: 9 %
Neutro Abs: 2.9 K/uL (ref 1.7–7.7)
Neutrophils Relative %: 58 %
Platelets: 198 K/uL (ref 150–400)
RBC: 4.54 MIL/uL (ref 3.87–5.11)
RDW: 14.5 % (ref 11.5–15.5)
WBC: 4.9 K/uL (ref 4.0–10.5)
nRBC: 0 % (ref 0.0–0.2)

## 2024-05-25 LAB — URINALYSIS, W/ REFLEX TO CULTURE (INFECTION SUSPECTED)
Bilirubin Urine: NEGATIVE
Glucose, UA: NEGATIVE mg/dL
Hgb urine dipstick: NEGATIVE
Ketones, ur: 5 mg/dL — AB
Nitrite: POSITIVE — AB
Protein, ur: NEGATIVE mg/dL
Specific Gravity, Urine: 1.017 (ref 1.005–1.030)
WBC, UA: >50 WBC/hpf (ref 0–5)
pH: 5 (ref 5.0–8.0)

## 2024-05-25 LAB — COMPREHENSIVE METABOLIC PANEL WITH GFR
ALT: 13 U/L (ref 0–44)
AST: 16 U/L (ref 15–41)
Albumin: 4.2 g/dL (ref 3.5–5.0)
Alkaline Phosphatase: 114 U/L (ref 38–126)
Anion gap: 15 (ref 5–15)
BUN: 21 mg/dL — ABNORMAL HIGH (ref 6–20)
CO2: 21 mmol/L — ABNORMAL LOW (ref 22–32)
Calcium: 9.4 mg/dL (ref 8.9–10.3)
Chloride: 106 mmol/L (ref 98–111)
Creatinine, Ser: 0.87 mg/dL (ref 0.44–1.00)
GFR, Estimated: >60 mL/min (ref >60)
Glucose, Bld: 213 mg/dL — ABNORMAL HIGH (ref 70–99)
Potassium: 3.7 mmol/L (ref 3.5–5.1)
Sodium: 141 mmol/L (ref 135–145)
Total Bilirubin: 0.2 mg/dL (ref 0.0–1.2)
Total Protein: 7.8 g/dL (ref 6.5–8.1)

## 2024-05-25 LAB — LACTIC ACID, PLASMA
Lactic Acid, Venous: 3.4 mmol/L (ref 0.5–1.9)
Lactic Acid, Venous: 2.8 mmol/L (ref 0.5–1.9)
Lactic Acid, Venous: 1.3 mmol/L (ref 0.5–1.9)

## 2024-05-25 MED ORDER — CEFTRIAXONE SODIUM 1 G IJ SOLR
1.0000 g | Freq: Once | INTRAMUSCULAR | Status: AC
  Administered 2024-05-25: 1 g via INTRAMUSCULAR
  Filled 2024-05-25: qty 10

## 2024-05-25 MED ORDER — MORPHINE SULFATE (PF) 4 MG/ML IV SOLN
4.0000 mg | Freq: Once | INTRAVENOUS | Status: AC
  Administered 2024-05-25: 4 mg via INTRAMUSCULAR

## 2024-05-25 MED ORDER — CIPROFLOXACIN HCL 500 MG PO TABS: 500.0000 mg | ORAL_TABLET | Freq: Two times a day (BID) | ORAL | 0 refills | Status: DC

## 2024-05-25 MED ORDER — MORPHINE SULFATE (PF) 4 MG/ML IV SOLN
4.0000 mg | Freq: Once | INTRAVENOUS | Status: DC
  Filled 2024-05-25: qty 1

## 2024-05-25 MED ORDER — ONDANSETRON 4 MG PO TBDP
4.0000 mg | ORAL_TABLET | Freq: Once | ORAL | Status: AC
Start: 2024-05-25 — End: 2024-05-25
  Administered 2024-05-25: 4 mg via ORAL
  Filled 2024-05-25: qty 1

## 2024-05-25 MED ORDER — ONDANSETRON HCL 4 MG/2ML IJ SOLN
4.0000 mg | Freq: Once | INTRAMUSCULAR | Status: DC
Start: 2024-05-25 — End: 2024-05-25
  Filled 2024-05-25: qty 2

## 2024-05-25 NOTE — ED Provider Notes (Signed)
 [place] [facility] AT [facility] Provider Note   CSN: [id number] Arrival date & time: 05/25/24  1537     Patient presents with: Flank Pain   Ta Regina Watson is a 58 y.o. female.   Pt is a 58 yo female with pmhx significant for DM, HTN, schizophrenia, MDD, breast cancer, and frequent UTIs.  Pt said she thinks she has another uti as she has dysuria and flank pain.  No fevers.        Prior to Admission medications   Medication Sig Start Date End Date Taking? Authorizing Provider  ciprofloxacin  (CIPRO ) 500 MG tablet Take 1 tablet (500 mg total) by mouth 2 (two) times daily. 05/25/24  Yes Dean Clarity, MD  acetaminophen  (TYLENOL ) 325 MG tablet Take 650 mg by mouth every 6 (six) hours as needed for mild pain or moderate pain.    [provider]  acetaZOLAMIDE  ER (DIAMOX ) 500 MG capsule Take 1 capsule (500 mg total) by mouth 2 (two) times daily. 01/04/23   Pearlean Manus, MD  albuterol  (VENTOLIN  HFA) 108 (90 Base) MCG/ACT inhaler Inhale 2 puffs into the lungs every 4 (four) hours as needed for wheezing or shortness of breath.    [provider]  amLODipine  (NORVASC ) 5 MG tablet Take 1 tablet (5 mg total) by mouth daily. 01/04/23   Pearlean Manus, MD  atorvastatin  (LIPITOR) 10 MG tablet Take 1 tablet (10 mg total) by mouth every evening. 01/04/23   Pearlean Manus, MD  benztropine  (COGENTIN ) 0.5 MG tablet Take 0.5 mg by mouth 2 (two) times daily.    [provider]  colchicine  0.6 MG tablet Take 1 tablet (0.6 mg total) by mouth daily. 01/04/23   Pearlean Manus, MD  divalproex  (DEPAKOTE ) 250 MG DR tablet Take 5 tablets (1,250 mg total) by mouth every evening. 12/13/23   Mardy Legacy, NP  fluconazole (DIFLUCAN) 100 MG tablet Take 100 mg by mouth daily. 12/01/23   [provider]  furosemide  (LASIX ) 20 MG tablet Take 1 tablet (20 mg total) by mouth 2 (two) times daily. 12/30/23 01/29/24  Kommor, Madison, MD  gabapentin   (NEURONTIN ) 400 MG capsule Take 1 capsule (400 mg total) by mouth 3 (three) times daily. 01/04/23   Pearlean Manus, MD  hydrOXYzine  (ATARAX ) 25 MG tablet Take 25 mg by mouth 3 (three) times daily.    [provider]  insulin  glargine (LANTUS ) 100 UNIT/ML injection Inject 0.25 mLs (25 Units total) into the skin at bedtime. 01/04/23   Pearlean Manus, MD  LANTUS  SOLOSTAR 100 UNIT/ML Solostar Pen Inject into the skin. 11/30/23   [provider]  levocetirizine (XYZAL ) 5 MG tablet Take 5 mg by mouth daily.    [provider]  metFORMIN  (GLUCOPHAGE ) 1000 MG tablet Take 1 tablet (1,000 mg total) by mouth 2 (two) times daily with a meal. 01/04/23   Emokpae, Courage, MD  NOVOLOG  FLEXPEN 100 UNIT/ML FlexPen Inject into the skin. 10/27/23   [provider]  omeprazole  (PRILOSEC) 40 MG capsule TAKE ONE CAPSULE BY MOUTH ONCE DAILY. NEEDS OFFICE VISIT 05/05/23   Eartha Flavors, Toribio, MD  polyethylene glycol (MIRALAX  / GLYCOLAX ) 17 g packet Take 17 g by mouth daily.    [provider]  potassium chloride  (KLOR-CON ) 10 MEQ tablet Take 1 tablet (10 mEq total) by mouth daily. Take While taking Diamox /acetazolamide  01/04/23   Pearlean Manus, MD  QUEtiapine  (SEROQUEL  XR) 400 MG 24 hr tablet Take 2 tablets (800 mg total) by mouth at bedtime. 01/04/23  Pearlean Manus, MD  risperiDONE  (RISPERDAL  M-TABS) 3 MG disintegrating tablet Take 1 tablet (3 mg total) by mouth every morning. 01/04/23   Pearlean Manus, MD  risperiDONE  (RISPERDAL ) 1 MG tablet Take 0.5 tablets (0.5 mg total) by mouth 2 (two) times daily as needed (agitation). 12/13/23 12/12/24  Mardy Legacy, NP  Semaglutide ,0.25 or 0.5MG /DOS, (OZEMPIC , 0.25 OR 0.5 MG/DOSE,) 2 MG/1.5ML SOPN Inject 0.25 mg into the skin every Friday. 01/06/23   Pearlean Manus, MD  sennosides-docusate sodium  (SENOKOT-S) 8.6-50 MG tablet Take 2 tablets by mouth at bedtime. 01/04/23   Emokpae, Courage, MD  SYSTANE ULTRA PF 0.4-0.3 % SOLN  Place 2 drops into both eyes in the morning, at noon, and at bedtime. 12/21/22   [provider]  tamoxifen  (NOLVADEX ) 20 MG tablet Take 1 tablet (20 mg total) by mouth daily. 01/04/23   Pearlean Manus, MD  tiotropium (SPIRIVA ) 18 MCG inhalation capsule Place 1 capsule (18 mcg total) into inhaler and inhale daily. 01/04/23   Pearlean Manus, MD  traMADol  (ULTRAM ) 50 MG tablet Take 1 tablet (50 mg total) by mouth every 8 (eight) hours as needed for severe pain or moderate pain. 01/04/23   Pearlean Manus, MD  triamcinolone  cream (KENALOG ) 0.1 % Apply 1 Application topically 2 (two) times daily. 04/15/24   Leath-Warren, Etta PARAS, NP  Vitamin D , Ergocalciferol , (DRISDOL ) 1.25 MG (50000 UNIT) CAPS capsule Take 50,000 Units by mouth every Saturday.    [provider]    Allergies: Ziprasidone  hcl, Penicillinase, Sulfa antibiotics, Sulfamethoxazole-trimethoprim, Penicillins, and Latex    Review of Systems  Genitourinary:  Positive for dysuria and flank pain.    Updated Vital Signs BP (!) 172/86   Pulse 99   Temp 98 F (36.7 C) (Oral)   Resp 17   Ht 5' 8 (1.727 m)   Wt 90.7 kg   SpO2 94%   BMI 30.40 kg/m   Physical Exam Vitals and nursing note reviewed.  Constitutional:      Appearance: Normal appearance.  HENT:     Head: Normocephalic and atraumatic.     Right Ear: External ear normal.     Left Ear: External ear normal.     Nose: Nose normal.     Mouth/Throat:     Mouth: Mucous membranes are dry.  Eyes:     Conjunctiva/sclera: Conjunctivae normal.  Cardiovascular:     Rate and Rhythm: Regular rhythm. Tachycardia present.     Pulses: Normal pulses.     Heart sounds: Normal heart sounds.  Pulmonary:     Effort: Pulmonary effort is normal.     Breath sounds: Normal breath sounds.  Abdominal:     General: Abdomen is flat. Bowel sounds are normal.     Palpations: Abdomen is soft.  Musculoskeletal:     Cervical back: Normal range of motion and neck supple.      Right lower leg: Edema present.     Left lower leg: Edema present.  Skin:    General: Skin is warm.     Capillary Refill: Capillary refill takes less than 2 seconds.  Neurological:     General: No focal deficit present.     Mental Status: She is alert and oriented to person, place, and time.  Psychiatric:        Mood and Affect: Mood normal.        Behavior: Behavior normal.        Thought Content: Thought content normal.     (all labs ordered are listed,  but only abnormal results are displayed) Labs Reviewed  COMPREHENSIVE METABOLIC PANEL WITH GFR - Abnormal; Notable for the following components:      Result Value   CO2 21 (*)    Glucose, Bld 213 (*)    BUN 21 (*)    All other components within normal limits  URINALYSIS, W/ REFLEX TO CULTURE (INFECTION SUSPECTED) - Abnormal; Notable for the following components:   APPearance HAZY (*)    Ketones, ur 5 (*)    Nitrite POSITIVE (*)    Leukocytes,Ua MODERATE (*)    Bacteria, UA MANY (*)    All other components within normal limits  LACTIC ACID, PLASMA - Abnormal; Notable for the following components:   Lactic Acid, Venous 3.4 (*)    All other components within normal limits  LACTIC ACID, PLASMA - Abnormal; Notable for the following components:   Lactic Acid, Venous 2.8 (*)    All other components within normal limits  CULTURE, BLOOD (ROUTINE X 2)  CULTURE, BLOOD (ROUTINE X 2)  URINE CULTURE  CBC WITH DIFFERENTIAL/PLATELET  LACTIC ACID, PLASMA  LACTIC ACID, PLASMA    EKG: None  Radiology: CT Renal Stone Study Result Date: 05/25/2024 CLINICAL DATA:  Abdominal/flank pain, stone suspected.  Recent UTI. EXAM: CT ABDOMEN AND PELVIS WITHOUT CONTRAST TECHNIQUE: Multidetector CT imaging of the abdomen and pelvis was performed following the standard protocol without IV contrast. RADIATION DOSE REDUCTION: This exam was performed according to the departmental dose-optimization program which includes automated exposure control,  adjustment of the mA and/or kV according to patient size and/or use of iterative reconstruction technique. COMPARISON:  04/06/2024. FINDINGS: Lower chest: Multivessel coronary artery calcifications are noted. Emphysematous changes are noted at the lung bases. Hepatobiliary: No focal liver abnormality is seen. No gallstones, gallbladder wall thickening, or biliary dilatation. Pancreas: Unremarkable. No pancreatic ductal dilatation or surrounding inflammatory changes. Spleen: Normal in size without focal abnormality. Adrenals/Urinary Tract: The adrenal glands are within normal limits. No renal calculus or hydronephrosis bilaterally. The bladder is unremarkable. Stomach/Bowel: No bowel obstruction, free air, or pneumatosis is seen. There is a moderate to large amount of retained stool in the colon. Appendix appears normal. Vascular/Lymphatic: Aortic atherosclerosis. No enlarged abdominal or pelvic lymph nodes. Reproductive: There is a stable soft tissue mass adjacent to the uterus on the right, compatible with known exophytic fibroid. No adnexal mass is seen. Other: No abdominopelvic ascites. There is a small fat containing umbilical hernia. Musculoskeletal: Degenerative changes are present in the thoracolumbar spine. No acute osseous abnormality. IMPRESSION: 1. No renal calculus or obstructive uropathy bilaterally. Evaluation for pyelonephritis is limited due to lack of IV contrast. 2. Moderate to large amount of retained stool in the colon suggesting constipation. 3. Stable uterine fibroid. 4. Aortic atherosclerosis. Electronically Signed   By: Leita Birmingham M.D.   On: 05/25/2024 18:16     Procedures   Medications Ordered in the ED  morphine  (PF) 4 MG/ML injection 4 mg (4 mg Intramuscular Given 05/25/24 1655)  ondansetron  (ZOFRAN -ODT) disintegrating tablet 4 mg (4 mg Oral Given 05/25/24 1651)  cefTRIAXone  (ROCEPHIN ) injection 1 g (1 g Intramuscular Given 05/25/24 1653)                                     Medical Decision Making Amount and/or Complexity of Data Reviewed Labs: ordered. Radiology: ordered.  Risk Prescription drug management.   This patient presents to the ED for  concern of dysuria, flank pain, this involves an extensive number of treatment options, and is a complaint that carries with it a high risk of complications and morbidity.  The differential diagnosis includes uti, pyelo, kidney stone, msk   Co morbidities that complicate the patient evaluation   DM, HTN, schizophrenia, MDD, breast cancer, and frequent UTIs   Additional history obtained:  Additional history obtained from epic chart review External records from outside source obtained and reviewed including EMS report   Lab Tests:  I Ordered, and personally interpreted labs.  The pertinent results include:  ua + for uti; cbc nl; cmp nl other than glucose elevated at 213.  Lactic acid initially elevated, but is now down to 1.3   Imaging Studies ordered:  I ordered imaging studies including ct renal  I independently visualized and interpreted imaging which showed  1. No renal calculus or obstructive uropathy bilaterally. Evaluation  for pyelonephritis is limited due to lack of IV contrast.  2. Moderate to large amount of retained stool in the colon  suggesting constipation.  3. Stable uterine fibroid.  4. Aortic atherosclerosis.   I agree with the radiologist interpretation   Medicines ordered and prescription drug management:  I ordered medication including rocephin /morphine /zofran   for sx  Reevaluation of the patient after these medicines showed that the patient improved I have reviewed the patients home medicines and have made adjustments as needed   Test Considered:  ct   Critical Interventions:  abx   Problem List / ED Course:  Uti:  pt is given rocephin  in ED.  Her most recent urine cx was contaminated.  Cx in feb grew out klebsiella and ecoli sensitive to cipro , so she's d/c with  that.  I attempted to call her legal guardian, but she's on a leave of absence. Lactic acidosis: pt refused iv.  She cleared the acidosis with oral intake.   Reevaluation:  After the interventions noted above, I reevaluated the patient and found that they have :improved   Social Determinants of Health:  Lives in a facility   Dispostion:  After consideration of the diagnostic results and the patients response to treatment, I feel that the patent would benefit from discharge with outpatient f/u.       Final diagnoses:  Acute cystitis without hematuria    ED Discharge Orders          Ordered    ciprofloxacin  (CIPRO ) 500 MG tablet  2 times daily        05/25/24 2349               Dean Clarity, MD 05/25/24 2352

## 2024-05-25 NOTE — ED Notes (Signed)
 Pt given [place] sandwich

## 2024-05-25 NOTE — ED Notes (Signed)
 Patient is refusing US  IV at this time.

## 2024-05-25 NOTE — ED Notes (Signed)
 Pt drinking water at this time.

## 2024-05-25 NOTE — ED Notes (Signed)
 Phlebotomy at bedside getting blood\.

## 2024-05-25 NOTE — ED Triage Notes (Signed)
 Pt BIB ems from [facility] for bilateral flank pain. Per pt she has been treated for UTI continuously and I just cannot get rid of it.

## 2024-05-26 NOTE — ED Notes (Signed)
 [facility] here to get pt and transport back to facility.

## 2024-05-26 NOTE — ED Notes (Signed)
 Report called to [facility]. Reports they do not have transportation to pick up pt tonight.

## 2024-05-27 LAB — URINE CULTURE: Culture: >=100000 — AB

## 2024-05-28 ENCOUNTER — Telehealth (HOSPITAL_BASED_OUTPATIENT_CLINIC_OR_DEPARTMENT_OTHER): Payer: Self-pay

## 2024-05-28 NOTE — Telephone Encounter (Signed)
 Post ED Visit - Positive Culture Follow-up  Culture report reviewed by antimicrobial stewardship pharmacist: [facility] [x]  Amon Rocher, Berdine.D. []  Venetia Gully, Pharm.D., BCPS AQ-ID []  Garrel Crews, Pharm.D., BCPS []  Almarie Lunger, [address].D., BCPS []  [place], [address].D., BCPS, AAHIVP []  Rosaline Bihari, Pharm.D., BCPS, AAHIVP []  Vernell Meier, PharmD, BCPS []  Latanya Hint, PharmD, BCPS []  Donald Medley, PharmD, BCPS []  Rocky Bold, PharmD []  Dorothyann Alert, PharmD, BCPS []  Morene Babe, PharmD  [facility] []  Rosaline Edison, PharmD []  Romona Bliss, PharmD []  Dolphus Roller, PharmD []  Veva Seip, Rph []  Vernell Daunt) Leonce, PharmD []  Eva Allis, PharmD []  Rosaline Millet, PharmD []  Iantha Batch, PharmD []  Arvin Gauss, PharmD []  Wanda Hasting, PharmD []  Ronal Rav, PharmD []  Rocky Slade, PharmD []  Bard Jeans, PharmD   Positive urine culture Treated with Ciprofloxacin , organism sensitive to the same and no further patient follow-up is required at this time.  Ruth Camelia Elbe 05/28/2024, 12:08 PM

## 2024-05-30 LAB — CULTURE, BLOOD (ROUTINE X 2)
Culture: NO GROWTH
Culture: NO GROWTH

## 2024-06-03 ENCOUNTER — Other Ambulatory Visit: Payer: Self-pay | Admitting: Oncology

## 2024-06-24 ENCOUNTER — Ambulatory Visit (INDEPENDENT_AMBULATORY_CARE_PROVIDER_SITE_OTHER): Payer: MEDICAID | Admitting: Podiatry

## 2024-06-24 ENCOUNTER — Encounter: Payer: Self-pay | Admitting: Podiatry

## 2024-06-24 DIAGNOSIS — M79675 Pain in left toe(s): Secondary | ICD-10-CM | POA: Diagnosis not present

## 2024-06-24 DIAGNOSIS — B351 Tinea unguium: Secondary | ICD-10-CM

## 2024-06-24 DIAGNOSIS — M79674 Pain in right toe(s): Secondary | ICD-10-CM

## 2024-06-24 DIAGNOSIS — E119 Type 2 diabetes mellitus without complications: Secondary | ICD-10-CM

## 2024-06-24 DIAGNOSIS — L84 Corns and callosities: Secondary | ICD-10-CM

## 2024-06-24 NOTE — Progress Notes (Signed)
 This patient returns to my office for at risk foot care.  This patient requires this care by a professional since this patient will be at risk due to having diabetic neuropathy and amputation second toe left foot. This patient is unable to cut nails herself since the patient cannot reach her nails.These nails are painful walking and wearing shoes.  This patient presents for at risk foot care today.  General Appearance  Alert, conversant and in no acute stress.  Vascular  Dorsalis pedis and posterior tibial  pulses are palpable  bilaterally.  Capillary return is within normal limits  bilaterally. Temperature is within normal limits  bilaterally.  Neurologic  Senn-Weinstein monofilament wire test within normal limits  bilaterally. Muscle power within normal limits bilaterally.  Nails Thick disfigured discolored nails with subungual debris  from hallux to fifth toes bilaterally. No evidence of bacterial infection or drainage bilaterally.  Orthopedic  No limitations of motion  feet .  No crepitus or effusions noted.  No bony pathology or digital deformities noted.  Amputation second toe left foot.  No motion 1st MPJ left foot.  Skin  normotropic skin with no porokeratosis noted bilaterally.  No signs of infections or ulcers noted.   Symptomatic callus left foot under tibial sesamoid.  Onychomycosis  Pain in right toes  Pain in left toes  Consent was obtained for treatment procedures.   Mechanical debridement of nails 1-5  bilaterally performed with a nail nipper.  Filed with dremel without incident. Callus de brided with dremel tool as a courtesy.   Return office visit   3 months                   Told patient to return for periodic foot care and evaluation due to potential at risk complications.    Cordella Bold DPM

## 2024-06-27 ENCOUNTER — Encounter (INDEPENDENT_AMBULATORY_CARE_PROVIDER_SITE_OTHER): Payer: Self-pay | Admitting: Gastroenterology

## 2024-07-05 ENCOUNTER — Encounter (HOSPITAL_COMMUNITY): Payer: Self-pay | Admitting: Emergency Medicine

## 2024-07-05 ENCOUNTER — Emergency Department (HOSPITAL_COMMUNITY)
Admission: EM | Admit: 2024-07-05 | Discharge: 2024-07-06 | Disposition: A | Discharge status: Other Acute Inpatient Hospital | Payer: MEDICAID | Attending: Emergency Medicine | Admitting: Emergency Medicine

## 2024-07-05 ENCOUNTER — Other Ambulatory Visit: Payer: Self-pay

## 2024-07-05 DIAGNOSIS — R441 Visual hallucinations: Secondary | ICD-10-CM | POA: Diagnosis not present

## 2024-07-05 DIAGNOSIS — E119 Type 2 diabetes mellitus without complications: Secondary | ICD-10-CM | POA: Insufficient documentation

## 2024-07-05 DIAGNOSIS — R440 Auditory hallucinations: Secondary | ICD-10-CM | POA: Insufficient documentation

## 2024-07-05 DIAGNOSIS — F17210 Nicotine dependence, cigarettes, uncomplicated: Secondary | ICD-10-CM | POA: Insufficient documentation

## 2024-07-05 DIAGNOSIS — R443 Hallucinations, unspecified: Secondary | ICD-10-CM

## 2024-07-05 DIAGNOSIS — Z853 Personal history of malignant neoplasm of breast: Secondary | ICD-10-CM | POA: Diagnosis not present

## 2024-07-05 DIAGNOSIS — I10 Essential (primary) hypertension: Secondary | ICD-10-CM | POA: Insufficient documentation

## 2024-07-05 LAB — CBC
HCT: 39 % (ref 36.0–46.0)
Hemoglobin: 12.4 g/dL (ref 12.0–15.0)
MCH: 29.8 pg (ref 26.0–34.0)
MCHC: 31.8 g/dL (ref 30.0–36.0)
MCV: 93.8 fL (ref 80.0–100.0)
Platelets: 145 K/uL — ABNORMAL LOW (ref 150–400)
RBC: 4.16 MIL/uL (ref 3.87–5.11)
RDW: 14.7 % (ref 11.5–15.5)
WBC: 5.4 K/uL (ref 4.0–10.5)
nRBC: 0 % (ref 0.0–0.2)

## 2024-07-05 LAB — COMPREHENSIVE METABOLIC PANEL WITH GFR
ALT: 17 U/L (ref 0–44)
AST: 20 U/L (ref 15–41)
Albumin: 4 g/dL (ref 3.5–5.0)
Alkaline Phosphatase: 93 U/L (ref 38–126)
Anion gap: 11 (ref 5–15)
BUN: 21 mg/dL — ABNORMAL HIGH (ref 6–20)
CO2: 24 mmol/L (ref 22–32)
Calcium: 9 mg/dL (ref 8.9–10.3)
Chloride: 106 mmol/L (ref 98–111)
Creatinine, Ser: 0.77 mg/dL (ref 0.44–1.00)
GFR, Estimated: >60 mL/min (ref >60)
Glucose, Bld: 167 mg/dL — ABNORMAL HIGH (ref 70–99)
Potassium: 4.1 mmol/L (ref 3.5–5.1)
Sodium: 141 mmol/L (ref 135–145)
Total Bilirubin: <0.2 mg/dL (ref 0.0–1.2)
Total Protein: 7.1 g/dL (ref 6.5–8.1)

## 2024-07-05 LAB — ETHANOL: Alcohol, Ethyl (B): <15 mg/dL (ref <15)

## 2024-07-05 NOTE — ED Triage Notes (Addendum)
 Pt bib EMS from [facility] after she told staff she wants to be committed. States she has been hearing voices and seeing people who aren't there for about 2 weeks. Pt also reports that she has been having trouble sleeping. Pt reports that she is having a mental breakdown and wants to be involuntarily committed to a mental ward Denies SI or HI. 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

## 2024-07-05 NOTE — ED Provider Notes (Signed)
 [facility] DE[facility] Emergency Department Provider Note MRN:  [id number]  Arrival date & time: 07/05/24     Chief Complaint   Hallucinations   History of Present Illness   Regina Watson is a 58 y.o. year-old female with a history of schizophrenia, diabetes presenting to the ED with chief complaint of hallucinations.  Patient endorsing worsening mental health over the past few weeks with now a mental breakdown.  Having persistent distressing hallucinations of multiple people where she lives doing things that are concerning.  Once home, wants to be admitted inpatient for psychiatric care.  Denies bodily complaints.  Review of Systems  A thorough review of systems was obtained and all systems are negative except as noted in the HPI and PMH.   Patient's Health History    Past Medical History:  Diagnosis Date   Anemia    Blind    Breast cancer (HCC)    Diabetes mellitus without complication (HCC)    Hypertension    MDD (major depressive disorder)    Neuropathy 2010   Schizophrenia (HCC)     Past Surgical History:  Procedure Laterality Date   BIOPSY  04/12/2022   Procedure: BIOPSY;  Surgeon: Eartha Angelia Sieving, MD;  Location: [facility];  Service: Gastroenterology;;   COLONOSCOPY WITH PROPOFOL  N/A 04/12/2022   Procedure: COLONOSCOPY WITH PROPOFOL ;  Surgeon: Eartha Angelia Sieving, MD;  Location: [facility];  Service: Gastroenterology;  Laterality: N/A;  1230 ASA 3 pt at [facility]   ESOPHAGOGASTRODUODENOSCOPY (EGD) WITH PROPOFOL  N/A 04/12/2022   Procedure: ESOPHAGOGASTRODUODENOSCOPY (EGD) WITH PROPOFOL ;  Surgeon: Eartha Angelia Sieving, MD;  Location: [facility];  Service: Gastroenterology;  Laterality: N/A;   INCISION AND DRAINAGE ABSCESS Left 05/08/2022   Procedure: INCISION AND DRAINAGE ABSCESS of left breast abscess;  Surgeon: Evonnie Dorothyann LABOR, DO;  Location: [facility] ORS;  Service: General;  Laterality: Left;   TOE  AMPUTATION      Family History  Problem Relation Age of Onset   Hypertension Mother    Cancer Mother    Diabetes Father     Social History   Socioeconomic History   Marital status: Widowed    Spouse name: Not on file   Number of children: Not on file   Years of education: Not on file   Highest education level: Not on file  Occupational History   Not on file  Tobacco Use   Smoking status: Every Day    Current packs/day: 1.00    Average packs/day: 1 pack/day for 20.0 years (20.0 ttl pk-yrs)    Types: Cigarettes   Smokeless tobacco: Never  Vaping Use   Vaping status: Never Used  Substance and Sexual Activity   Alcohol  use: Not Currently   Drug use: Not Currently   Sexual activity: Yes    Birth control/protection: None  Other Topics Concern   Not on file  Social History Narrative   epworth scale score 18   Social Drivers of Health   Financial Resource Strain: Low Risk  (09/07/2022)   Received from [facility]   Overall Financial Resource Strain (CARDIA)    Difficulty of Paying Living Expenses: Not hard at all  Food Insecurity: No Food Insecurity (09/07/2022)   Received from [facility]   Hunger Vital Sign    Within the past 12 months, you worried that your food would run out before you got the money to buy more.: Never true    Within the past 12 months, the food  you bought just didn't last and you didn't have money to get more.: Never true  Transportation Needs: No Transportation Needs (09/07/2022)   Received from [facility]   PRAPARE - Transportation    Lack of Transportation (Medical): No    Lack of Transportation (Non-Medical): No  Physical Activity: Not on file  Stress: Not on file  Social Connections: Not on file  Intimate Partner Violence: Not on file     Physical Exam   Vitals:   07/05/24 2240  BP: (!) 151/84  Pulse: 99  Resp: 18  Temp: 98 F (36.7 C)  SpO2: 100%    CONSTITUTIONAL: Well-appearing, NAD NEURO/PSYCH:  Alert and oriented x 3,  no focal deficits EYES:  eyes equal and reactive ENT/NECK:  no LAD, no JVD CARDIO: Regular rate, well-perfused, normal S1 and S2 PULM:  CTAB no wheezing or rhonchi GI/GU:  non-distended, non-tender MSK/SPINE:  No gross deformities, no edema SKIN:  no rash, atraumatic   *Additional and/or pertinent findings included in MDM below  Diagnostic and Interventional Summary    EKG Interpretation Date/Time:    Ventricular Rate:    PR Interval:    QRS Duration:    QT Interval:    QTC Calculation:   R Axis:      Text Interpretation:         Labs Reviewed  CBC - Abnormal; Notable for the following components:      Result Value   Platelets 145 (*)    All other components within normal limits  COMPREHENSIVE METABOLIC PANEL WITH GFR  ETHANOL  URINE DRUG SCREEN    No orders to display    Medications - No data to display   Procedures  /  Critical Care Procedures  ED Course and Medical Decision Making  Initial Impression and Ddx Worsening hallucinations distressing to the patient.  She is endorses full compliance with her current medication regimen.  Denies any other complaints.  Consulting TTS, psychiatry for recommendations.  Past medical/surgical history that increases complexity of ED encounter: Schizophrenia  Interpretation of Diagnostics I personally reviewed the Laboratory Testing and my interpretation is as follows: No significant blood count or electrolyte disturbance.    Patient Reassessment and Ultimate Disposition/Management     Patient is now medically cleared awaiting TTS recommendations.  Patient management required discussion with the following services or consulting groups:  Psychiatry/TTS  Complexity of Problems Addressed Acute illness or injury that poses threat of life of bodily function  Additional Data Reviewed and Analyzed Further history obtained from: Past medical history and medications listed in the EMR and Prior ED visit notes  Additional  Factors Impacting ED Encounter Risk Consideration of hospitalization  Ozell HERO. Theadore, MD [facility] Emergency Medicine [facility] [email or web address]  Final Clinical Impressions(s) / ED Diagnoses     ICD-10-CM   1. Hallucinations  R44.3       ED Discharge Orders     None        Discharge Instructions Discussed with and Provided to Patient:   Discharge Instructions   None      Theadore Ozell HERO, MD 07/05/24 2336

## 2024-07-05 NOTE — ED Notes (Signed)
 Vina Pitt- sister- [phone]  [facility] supervisor - [phone]

## 2024-07-06 ENCOUNTER — Encounter (HOSPITAL_COMMUNITY): Payer: Self-pay

## 2024-07-06 LAB — RESP PANEL BY RT-PCR (RSV, FLU A&B, COVID)  RVPGX2
Influenza A by PCR: NEGATIVE
Influenza B by PCR: NEGATIVE
Resp Syncytial Virus by PCR: NEGATIVE
SARS Coronavirus 2 by RT PCR: NEGATIVE

## 2024-07-06 LAB — URINE DRUG SCREEN
Amphetamines: NEGATIVE
Barbiturates: NEGATIVE
Benzodiazepines: NEGATIVE
Cocaine: NEGATIVE
Fentanyl: NEGATIVE
Methadone Scn, Ur: NEGATIVE
Opiates: NEGATIVE
Tetrahydrocannabinol: NEGATIVE

## 2024-07-06 MED ORDER — ALBUTEROL SULFATE (2.5 MG/3ML) 0.083% IN NEBU: 3.0000 mL | INHALATION_SOLUTION | RESPIRATORY_TRACT | Status: DC | PRN

## 2024-07-06 MED ORDER — POLYETHYLENE GLYCOL 3350 17 G PO PACK
17.0000 g | PACK | Freq: Every day | ORAL | Status: DC
  Administered 2024-07-06: 17 g via ORAL
  Filled 2024-07-06: qty 1

## 2024-07-06 MED ORDER — RISPERIDONE 1 MG PO TBDP
3.0000 mg | ORAL_TABLET | Freq: Every morning | ORAL | Status: DC
  Administered 2024-07-06: 3 mg via ORAL
  Filled 2024-07-06: qty 3

## 2024-07-06 MED ORDER — PANTOPRAZOLE SODIUM 40 MG PO TBEC
80.0000 mg | DELAYED_RELEASE_TABLET | Freq: Every day | ORAL | Status: DC
  Administered 2024-07-06: 80 mg via ORAL
  Filled 2024-07-06: qty 2

## 2024-07-06 MED ORDER — RISPERIDONE 1 MG PO TABS
0.5000 mg | ORAL_TABLET | Freq: Two times a day (BID) | ORAL | Status: DC | PRN
Start: 2024-07-06 — End: 2024-07-06

## 2024-07-06 MED ORDER — DIVALPROEX SODIUM 250 MG PO DR TAB
1250.0000 mg | DELAYED_RELEASE_TABLET | Freq: Every evening | ORAL | Status: DC
Start: 2024-07-06 — End: 2024-07-06

## 2024-07-06 MED ORDER — ACETAMINOPHEN 325 MG PO TABS: 650.0000 mg | ORAL_TABLET | Freq: Four times a day (QID) | ORAL | Status: DC | PRN

## 2024-07-06 NOTE — ED Notes (Signed)
 Pt offered shower, advised she would take one later.
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
## 2024-07-06 NOTE — ED Notes (Signed)
 [facility] aware pt will be IVC'd and transported to there facility. They did not see any issues with still accepting the pt.

## 2024-07-06 NOTE — ED Provider Notes (Signed)
 Emergency Medicine Observation Re-evaluation Note  Regina Watson is a 58 y.o. female, seen on rounds today.  Pt initially presented to the ED for complaints of Hallucinations Currently, the patient is awaiting psychiatric admission.  Physical Exam  BP (!) 151/84 (BP Location: Left Arm)   Pulse 99   Temp 98 F (36.7 C) (Oral)   Resp 18   Ht 5' 8 (1.727 m)   Wt 91 kg   SpO2 100%   BMI 30.50 kg/m  Physical Exam Resting and in no acute distress  ED Course / MDM  EKG:   I have reviewed the labs performed to date as well as medications administered while in observation.  Recent changes in the last 24 hours include seen by behavioral health and they recommend psychiatric admission.  Plan  Current plan is for psychiatric placement.    Suzette Pac, MD 07/06/24 [phone]

## 2024-07-06 NOTE — ED Notes (Signed)
 [facility] called inquiring about pt for admission.

## 2024-07-06 NOTE — Progress Notes (Signed)
 Patient has been denied by [facility] due to no appropriate beds available. Patient meets BH inpatient criteria per Roxianne Olp, NP. Patient has been faxed out to the following facilities:   [facility]  [address] [place] [place] [address] [phone]  [facility]  [address] [place] [address] [phone]  [facility]  [address], [place] [address] [phone]  [facility] [place]  [address] [place], [place] [address] [phone]  [facility] Patient Pl[facility]  [facility], [place] [phone]  [facility]  [address], [place] [address] [phone]  [facility]  [address] [place], [place] [phone]  [facility]  [address] [place] [address] [phone]  [facility]  [place] [address] [phone]  [facility] [place] [address] [phone]  [facility]  [address] [place] [address] [phone]  [facility]  [address] [place], [place] [address] [phone]  [facility]  [address] [place] [address] [phone]  [facility]  [address] [place] [address] [phone]  [facility]  [address] [place] [address] [phone]  [facility]  [address] [place] [address] [phone]  [facility]  [address], [place] [address] [phone]  [facility]   [address] [place] [address] [phone]   Bunnie Gallop, MSW, LCSW-A  9:41 AM 07/06/2024

## 2024-07-06 NOTE — Discharge Instructions (Addendum)
 Transfer to [facility].  Dr.GAcengeci is accepting the patient

## 2024-07-06 NOTE — Progress Notes (Signed)
 BHH/BMU LCSW Progress Note   07/06/2024    10:02 AM  Lucie Piety   [id number]   Type of Contact and Topic:  Psychiatric Bed Placement   Pt accepted to [facility] UNIT     Patient meets inpatient criteria per Roxianne Moons, NP  The attending provider will be Dr. Alm Calender  Call report to [phone]  Rexene Terry Banker, RN @ [facility] ED notified.     Pt scheduled  to arrive at [facility] for TODAY.    Bunnie Gallop, MSW, LCSW-A  10:03 AM 07/06/2024

## 2024-07-06 NOTE — ED Notes (Signed)
 Pt left with [facility] with her belongings and paperwork.

## 2024-07-06 NOTE — ED Notes (Signed)
 Two bags of belongings placed in locker 2

## 2024-07-27 ENCOUNTER — Emergency Department
Admission: EM | Admit: 2024-07-27 | Discharge: 2024-07-27 | Disposition: A | Discharge status: Nursing Home (Includes ICF) | Payer: MEDICAID | Attending: Emergency Medicine | Admitting: Emergency Medicine

## 2024-07-27 ENCOUNTER — Other Ambulatory Visit: Payer: Self-pay

## 2024-07-27 ENCOUNTER — Encounter: Payer: Self-pay | Admitting: Emergency Medicine

## 2024-07-27 DIAGNOSIS — E11649 Type 2 diabetes mellitus with hypoglycemia without coma: Secondary | ICD-10-CM | POA: Insufficient documentation

## 2024-07-27 DIAGNOSIS — I10 Essential (primary) hypertension: Secondary | ICD-10-CM | POA: Insufficient documentation

## 2024-07-27 DIAGNOSIS — E162 Hypoglycemia, unspecified: Secondary | ICD-10-CM

## 2024-07-27 LAB — BASIC METABOLIC PANEL WITH GFR
Anion gap: 13 (ref 5–15)
BUN: 27 mg/dL — ABNORMAL HIGH (ref 6–20)
CO2: 20 mmol/L — ABNORMAL LOW (ref 22–32)
Calcium: 9 mg/dL (ref 8.9–10.3)
Chloride: 105 mmol/L (ref 98–111)
Creatinine, Ser: 0.87 mg/dL (ref 0.44–1.00)
GFR, Estimated: >60 mL/min (ref >60)
Glucose, Bld: 121 mg/dL — ABNORMAL HIGH (ref 70–99)
Potassium: 4.6 mmol/L (ref 3.5–5.1)
Sodium: 139 mmol/L (ref 135–145)

## 2024-07-27 LAB — CBC
HCT: 41 % (ref 36.0–46.0)
Hemoglobin: 12.8 g/dL (ref 12.0–15.0)
MCH: 29.9 pg (ref 26.0–34.0)
MCHC: 31.2 g/dL (ref 30.0–36.0)
MCV: 95.8 fL (ref 80.0–100.0)
Platelets: 258 K/uL (ref 150–400)
RBC: 4.28 MIL/uL (ref 3.87–5.11)
RDW: 13.5 % (ref 11.5–15.5)
WBC: 5.8 K/uL (ref 4.0–10.5)
nRBC: 0 % (ref 0.0–0.2)

## 2024-07-27 LAB — CBG MONITORING, ED: Glucose-Capillary: 116 mg/dL — ABNORMAL HIGH (ref 70–99)

## 2024-07-27 NOTE — Discharge Instructions (Addendum)
 Please speak to your primary care physician about adjusting your sliding scale insulin 

## 2024-07-27 NOTE — ED Notes (Signed)
 Pt given snack and drink per her request.

## 2024-07-27 NOTE — ED Notes (Signed)
 Called to [facility] regarding pt transport & Rep Alm referred me to [facility].Trip Number [id number]/[facility] Rep Rosalinda.

## 2024-07-27 NOTE — ED Notes (Signed)
 Per ED secretary @809pm  [facility] Rep Rosalinda requested transport home for pt. Trip Number is [id number]. Arrival time is 30-mins -3hrs!   This RN notified assisted living facility.  Okay per [facility].

## 2024-07-27 NOTE — ED Notes (Addendum)
 [facility] to transport, Trip [place] [id number]. Approx 3mins-3hrs.

## 2024-07-27 NOTE — ED Provider Notes (Signed)
 [facility] Provider Note   Event Date/Time   First MD Initiated Contact with Patient 07/27/24 1743     (approximate) History  Hypoglycemia  HPI Regina Watson is a 58 y.o. female with a past medical history of type 2 diabetes, hypertension, schizophrenia, and major depressive disorder who presents from assisted living facility after they found her to be hypoglycemic with a glucose of 67.  Patient was given 2 oral glucose packets as well as a a sandwich with an increase in her glucose to 94.  Patient states that they just changed her insulin  regimen recently.  Patient denies any other complaints ROS: Patient currently denies any vision changes, tinnitus, difficulty speaking, facial droop, sore throat, chest pain, shortness of breath, abdominal pain, nausea/vomiting/diarrhea, dysuria, or weakness/numbness/paresthesias in any extremity   Physical Exam  Triage Vital Signs: ED Triage Vitals  Encounter Vitals Group     BP 07/27/24 1719 128/73     Girls Systolic BP Percentile --      Girls Diastolic BP Percentile --      Boys Systolic BP Percentile --      Boys Diastolic BP Percentile --      Pulse Rate 07/27/24 1719 93     Resp 07/27/24 1719 18     Temp 07/27/24 1719 97.6 F (36.4 C)     Temp Source 07/27/24 1719 Oral     SpO2 07/27/24 1718 100 %     Weight 07/27/24 1720 200 lb 9.9 oz (91 kg)     Height 07/27/24 1720 5' 8 (1.727 m)     Head Circumference --      Peak Flow --      Pain Score 07/27/24 1719 0     Pain Loc --      Pain Education --      Exclude from Growth Chart --    Most recent vital signs: Vitals:   07/27/24 1718 07/27/24 1719  BP:  128/73  Pulse:  93  Resp:  18  Temp:  97.6 F (36.4 C)  SpO2: 100% 99%   General: Awake, oriented x4. CV:  Good peripheral perfusion. Resp:  Normal effort. Abd:  No distention. Other:  Middle-aged obese African-American female resting comfortably in no acute distress ED Results / Procedures /  Treatments  Labs (all labs ordered are listed, but only abnormal results are displayed) Labs Reviewed  BASIC METABOLIC PANEL WITH GFR - Abnormal; Notable for the following components:      Result Value   CO2 20 (*)    Glucose, Bld 121 (*)    BUN 27 (*)    All other components within normal limits  CBG MONITORING, ED - Abnormal; Notable for the following components:   Glucose-Capillary 116 (*)    All other components within normal limits  CBC  CBG MONITORING, ED  PROCEDURES: Critical Care performed: No Procedures MEDICATIONS ORDERED IN ED: Medications - No data to display IMPRESSION / MDM / ASSESSMENT AND PLAN / ED COURSE  I reviewed the triage vital signs and the nursing notes.                             The patient is on the cardiac monitor to evaluate for evidence of arrhythmia and/or significant heart rate changes. Patient's presentation is most consistent with acute presentation with potential threat to life or bodily function. Patient is a 58 year old female with the above-stated past medical history  who presents complaining of hypoglycemia.  Patient's glucose 116 upon arrival and was given p.o. intake.  Patient showed no further signs of hypoglycemia and further discussion was made about adjusting patient's insulin  in the outpatient setting.  Patient expressed understanding and agrees with plan for discharge at this time with outpatient PCP follow-up.  Patient given strict return precautions and all questions answered prior to discharge  Dispo: Discharge home with PCP follow-up   FINAL CLINICAL IMPRESSION(S) / ED DIAGNOSES   Final diagnoses:  Hypoglycemia   Rx / DC Orders   ED Discharge Orders     None      Note:  This document was prepared using Dragon voice recognition software and may include unintentional dictation errors.   Jossie Artist POUR, MD 07/27/24 [phone]

## 2024-07-27 NOTE — ED Notes (Signed)
 Received return call from Ty, supervisor at assisted living facility.  This RN notified Ty of pt discharge and transportation status.

## 2024-07-27 NOTE — ED Notes (Signed)
 Pt given sherbert per her request.

## 2024-07-27 NOTE — ED Notes (Signed)
 Attempted to call legal guardian with no answer. Per pt she has no way to get home.

## 2024-07-27 NOTE — ED Notes (Signed)
 Per Regina Watson at assisted living facility, alternate phone # is [phone].

## 2024-07-27 NOTE — ED Triage Notes (Signed)
 Per EMS pt coming from [facility] assisted [facility]- called out for sick person. On arrival patient was jittery. Initial CBG 67. given 2 oral glucose and ate a sandwich. CBG to 64 to 94. Recent change to insulin  from PCP.

## 2024-08-18 ENCOUNTER — Emergency Department
Admission: EM | Admit: 2024-08-18 | Discharge: 2024-08-21 | Disposition: A | Discharge status: Other Acute Inpatient Hospital | Payer: MEDICAID | Attending: Emergency Medicine | Admitting: Emergency Medicine

## 2024-08-18 ENCOUNTER — Other Ambulatory Visit: Payer: Self-pay

## 2024-08-18 DIAGNOSIS — Z794 Long term (current) use of insulin: Secondary | ICD-10-CM | POA: Insufficient documentation

## 2024-08-18 DIAGNOSIS — Z853 Personal history of malignant neoplasm of breast: Secondary | ICD-10-CM | POA: Diagnosis not present

## 2024-08-18 DIAGNOSIS — I10 Essential (primary) hypertension: Secondary | ICD-10-CM | POA: Diagnosis not present

## 2024-08-18 DIAGNOSIS — Z79899 Other long term (current) drug therapy: Secondary | ICD-10-CM | POA: Diagnosis not present

## 2024-08-18 DIAGNOSIS — Z7984 Long term (current) use of oral hypoglycemic drugs: Secondary | ICD-10-CM | POA: Insufficient documentation

## 2024-08-18 DIAGNOSIS — F649 Gender identity disorder, unspecified: Secondary | ICD-10-CM | POA: Diagnosis not present

## 2024-08-18 DIAGNOSIS — R441 Visual hallucinations: Secondary | ICD-10-CM | POA: Diagnosis present

## 2024-08-18 DIAGNOSIS — F209 Schizophrenia, unspecified: Secondary | ICD-10-CM | POA: Diagnosis present

## 2024-08-18 DIAGNOSIS — F200 Paranoid schizophrenia: Secondary | ICD-10-CM | POA: Insufficient documentation

## 2024-08-18 DIAGNOSIS — R443 Hallucinations, unspecified: Secondary | ICD-10-CM

## 2024-08-18 DIAGNOSIS — E119 Type 2 diabetes mellitus without complications: Secondary | ICD-10-CM | POA: Diagnosis not present

## 2024-08-18 LAB — COMPREHENSIVE METABOLIC PANEL WITH GFR
ALT: 11 U/L (ref 0–44)
AST: 18 U/L (ref 15–41)
Albumin: 3.8 g/dL (ref 3.5–5.0)
Alkaline Phosphatase: 94 U/L (ref 38–126)
Anion gap: 13 (ref 5–15)
BUN: 26 mg/dL — ABNORMAL HIGH (ref 6–20)
CO2: 19 mmol/L — ABNORMAL LOW (ref 22–32)
Calcium: 8.5 mg/dL — ABNORMAL LOW (ref 8.9–10.3)
Chloride: 109 mmol/L (ref 98–111)
Creatinine, Ser: 0.84 mg/dL (ref 0.44–1.00)
GFR, Estimated: >60 mL/min
Glucose, Bld: 234 mg/dL — ABNORMAL HIGH (ref 70–99)
Potassium: 3.7 mmol/L (ref 3.5–5.1)
Sodium: 140 mmol/L (ref 135–145)
Total Bilirubin: 0.3 mg/dL (ref 0.0–1.2)
Total Protein: 6.9 g/dL (ref 6.5–8.1)

## 2024-08-18 LAB — ETHANOL: Alcohol, Ethyl (B): <15 mg/dL

## 2024-08-18 LAB — CBC
HCT: 36.3 % (ref 36.0–46.0)
Hemoglobin: 11.1 g/dL — ABNORMAL LOW (ref 12.0–15.0)
MCH: 29.3 pg (ref 26.0–34.0)
MCHC: 30.6 g/dL (ref 30.0–36.0)
MCV: 95.8 fL (ref 80.0–100.0)
Platelets: 162 K/uL (ref 150–400)
RBC: 3.79 MIL/uL — ABNORMAL LOW (ref 3.87–5.11)
RDW: 13.9 % (ref 11.5–15.5)
WBC: 4.1 K/uL (ref 4.0–10.5)
nRBC: 0 % (ref 0.0–0.2)

## 2024-08-18 NOTE — BH Assessment (Signed)
 This writer attempted to assess the patient; however, the patient was too somnolent to participate. TTS will follow up.

## 2024-08-18 NOTE — ED Notes (Signed)
 TTS attempted to speak with patient, but patient would not wake sufficiently to participate.

## 2024-08-18 NOTE — ED Triage Notes (Addendum)
 Patient to [facility] via [facility] from [facility].  EMS reports they were called because patient was having hallucinations.  On EMS arrival patient also complained about lower back pain.  Patient states she is seeing people walking through a hole in the wall and walking around with flashlights looking to take her stuff.  Patient states I don't want to go back to that place.  Patient is visual impaired but walks with steady gait with stand by assist.  EMS interventions --  CBG 254

## 2024-08-18 NOTE — ED Provider Notes (Signed)
" ° °  [facility] Provider Note    Event Date/Time   First MD Initiated Contact with Patient 08/18/24 2049     (approximate)  History   Chief Complaint: Hallucinations (bac) and Back Pain  HPI  Regina Watson is a 58 y.o. female with a pmhx anemia, dm, legally blind, schizophrenia presents to the ED from her group home for psychiatric evaluation. Pt states she has been seeing people go into her room and try to steal her things.  States she has a nervous condition and the group home is making her scared and she wants to sign papers to go to a new group home.  Denies Si/Hi.  No medical complaints tonight.   Physical Exam   Triage Vital Signs: ED Triage Vitals  Encounter Vitals Group     BP 08/18/24 2038 129/65     Girls Systolic BP Percentile --      Girls Diastolic BP Percentile --      Boys Systolic BP Percentile --      Boys Diastolic BP Percentile --      Pulse Rate 08/18/24 2038 90     Resp 08/18/24 2038 17     Temp 08/18/24 2038 98.5 F (36.9 C)     Temp Source 08/18/24 2038 Oral     SpO2 08/18/24 2038 100 %     Weight 08/18/24 2038 199 lb (90.3 kg)     Height 08/18/24 2038 5' 7.5 (1.715 m)     Head Circumference --      Peak Flow --      Pain Score 08/18/24 2110 0     Pain Loc --      Pain Education --      Exclude from Growth Chart --     Most recent vital signs: Vitals:   08/18/24 2038  BP: 129/65  Pulse: 90  Resp: 17  Temp: 98.5 F (36.9 C)  SpO2: 100%    General: Awake, no distress. Eating a sandwich tray CV:  Good peripheral perfusion.  Regular rate and rhythm  Resp:  Normal effort.  Equal breath sounds bilaterally.  Abd:  No distention.  Soft, nontender.  No rebound or guarding.  ED Results / Procedures / Treatments   MEDICATIONS ORDERED IN ED: Medications - No data to display   IMPRESSION / MDM / ASSESSMENT AND PLAN / ED COURSE  I reviewed the triage vital signs and the nursing notes.  Patient's presentation is  most consistent with acute presentation with potential threat to life or bodily function.  Pt p/w visual hallucinations and feeling scared at her group home.  Pts main complaint seems to be her dislike of her current group home and has asked me multiple times during my evaluation to send her to a new group home. I told her I have no control over that aspect, but since she is reportedly seeing things that are not there we will have psychiatry evaluate.  We will check labs and continue to monitor.  Pt agreeable.  I do not believe patient meets IVC criteria at this time, but is willing to stay voluntarily.   CBC, chemistry, reassuring.  Alcohol  negative.  Psychiatric evaluation pending.  FINAL CLINICAL IMPRESSION(S) / ED DIAGNOSES   dysphoria   Note:  This document was prepared using Dragon voice recognition software and may include unintentional dictation errors.   Dorothyann Drivers, MD 08/18/24 2325  "

## 2024-08-19 ENCOUNTER — Encounter: Payer: Self-pay | Admitting: Psychiatry

## 2024-08-19 DIAGNOSIS — F200 Paranoid schizophrenia: Secondary | ICD-10-CM | POA: Diagnosis not present

## 2024-08-19 LAB — URINE DRUG SCREEN
Amphetamines: NEGATIVE
Barbiturates: NEGATIVE
Benzodiazepines: NEGATIVE
Cocaine: NEGATIVE
Fentanyl: NEGATIVE
Methadone Scn, Ur: NEGATIVE
Opiates: NEGATIVE
Tetrahydrocannabinol: NEGATIVE

## 2024-08-19 LAB — VALPROIC ACID LEVEL: Valproic Acid Lvl: 26 ug/mL — ABNORMAL LOW (ref 50–100)

## 2024-08-19 NOTE — ED Notes (Signed)
"  Pt given meal tray.   "

## 2024-08-19 NOTE — Progress Notes (Signed)
 Placement was denied by [facility]).  The unit is unable to provide the appropriate staffing needed to ensure patient's safety.   [place], [facility] [phone]

## 2024-08-19 NOTE — ED Notes (Signed)
 Called dietary about pt's missing tray. Dietary stated pt's meal tray is ready and will be down shortly.

## 2024-08-19 NOTE — ED Notes (Signed)
 This NT assisted the patient to the restroom by pulling the walker to lead her. Placed a hat in the toilet for a urine specimen collection, but the patient sat too far back on the toilet for the urine to go in the receptacle. No acute needs at this time.

## 2024-08-19 NOTE — ED Notes (Signed)
 Patient sleeping

## 2024-08-19 NOTE — ED Notes (Signed)
 VOL CONSULT  DONE  PENDING  PLACEMENT

## 2024-08-19 NOTE — Progress Notes (Signed)
 Per [facility]), there are no appropriate beds available within our system.  Patient has been referred to the following facilities:   Service Provider Phone  [facility]  [phone]  [facility]  [phone]  [facility]  [phone]  [facility]  [phone]  [facility]  [phone]  [facility]  [phone]  [facility]  [phone]  [facility]  [phone]  [facility]  [phone]  [facility]  [phone]  [facility]  [phone]  [facility]  [phone]  [facility]  [phone]  [facility]  [phone]  [facility]  [phone]  [facility]  [phone]  [facility]  [phone], [place] [phone]

## 2024-08-19 NOTE — ED Notes (Signed)
 Meal provided

## 2024-08-19 NOTE — ED Notes (Signed)
 Pt assisted to the bathroom using walker. Pt returned to bed and requested sprite. Drink given and no further needs at this time.

## 2024-08-19 NOTE — ED Notes (Signed)
 Called dietary about pt's missing tray. Dietary stated pt had a ticket, and tray would be up soon.

## 2024-08-19 NOTE — ED Notes (Signed)
 Patient tried to give a urine sample but not urine at the time will try again.

## 2024-08-19 NOTE — ED Notes (Signed)
Vol /psych consult pending 

## 2024-08-19 NOTE — Progress Notes (Addendum)
 [facility] received call from [facility].  [facility] provided RN's # for placement review.   [facility] denied placement due to pt being a fall/safety risk.   [place], [facility] [phone]

## 2024-08-19 NOTE — ED Provider Notes (Signed)
 Emergency Medicine Observation Re-evaluation Note  Regina Watson is a 58 y.o. female, seen on rounds today.  Pt initially presented to the ED for complaints of Hallucinations (bac) and Back Pain Currently, the patient is resting comfortably in bed.  Physical Exam  BP 132/71 (BP Location: Left Arm)   Pulse 85   Temp 98.4 F (36.9 C) (Oral)   Resp 18   Ht 5' 7.5 (1.715 m)   Wt 90.3 kg   SpO2 100%   BMI 30.71 kg/m  Physical Exam General: No acute distress Psych: Not agitated  ED Course / MDM  EKG:   I have reviewed the labs performed to date as well as medications administered while in observation.  No recent changes last 24 hours.  Plan  Current plan is for psychiatric disposition.    Clarine Ozell LABOR, MD 08/19/24 [phone]

## 2024-08-19 NOTE — ED Notes (Addendum)
 Spoke with Ty (Assisted Living Caretaker) from [facility] on the phone. Updated on waiting for behavioral health placement.

## 2024-08-19 NOTE — Consult Note (Addendum)
 [facility] Consult Note  Patient Name: Sharlene Mccluskey MRN: [id number] DOB: 09-12-1965 DATE OF Consult: 08/19/2024  PRIMARY PSYCHIATRIC DIAGNOSES  1.  Schizophrenia, paranoid acute exacerbation    RECOMMENDATIONS  Inpt psych admission recommended:    [x] YES    pt has legal guardian, unable to reach for consent;  Pt would meet IVC criteria as  demonstrating Severe impairment of reality assessment, judgment, logical thinking and planning that result in being an imminent danger to self or others.    Medication recommendations:  recommend continue with home medication at this time  [facility] not available at this time for this provider to reconcile and order  Non-Medication recommendations: obtain depakote  level; recommend EKG; recommend updated CT/MRI of brain to rule out cognitive disorders and ensure  no cancer metastasis may be worsening acute exacerbation of symptoms of her schizophrenia;   ongoing effort to reach legal guardian; provide frequent reassurance,  1. Establish a Therapeutic, Non-Confrontational Relationship Approach calmly and with respect. Use a neutral, nonjudgmental tone. Maintain predictable routines and interactions. Rationale: Trust lowers anxiety and reduces delusional intensity.  2. Do Not Argue With or Directly Challenge the Delusion Avoid statements like That's not true or You're imagining this. Instead say: I understand that this is very real for you. I don't share that belief, but I want to understand what you're experiencing. Rationale: Confrontation increases defensiveness and may escalate paranoia.  3. Focus on Feelings, Not Content Shift toward emotional validation: That sounds frightening. It seems like this is causing you a lot of fear. Tell me more about what that's like. Rationale: Reduces anxiety without reinforcing the delusion.  4. Gently Present Reality Without Forcing It Use soft reality-orientation statements: I don't see  anyone trying to harm you right now. We are in a safe place, and I'm here with you. Rationale: Helps anchor the patient without confrontation.  5. Maintain a Low-Stimulation Environment Reduce noise, crowds, and chaotic stimuli. Provide a quiet space when paranoia is high. Rationale: Delusions worsen with overstimulation.  6. Promote Safety and Reduce Triggers Assess for risk of harm to self or others. Ensure the environment is free of objects that could be used impulsively. Monitor for escalating agitation. Rationale: Persecutory delusions can drive unintentional violence or self-protection behaviors.  7. Encourage Diversional Activities Simple tasks, art, journaling, music, or structured group activities. Gentle redirection: Let's take a walk, Let's do this activity together. Rationale: Helps break fixed thought patterns without confrontation.  8. Monitor Medication Effects and Side Effects Observe for changes in psychosis, agitation, or mood. Monitor for extrapyramidal symptoms, sedation, or orthostatic hypotension. Report worsening symptoms promptly. Rationale: Medication stabilization is central to reducing delusions.  9. Reinforce Reality Through the [employer], calendars, and labeled areas. Consistent caregivers; minimize staff changes when possible. Rationale: Supports orientation and reduces paranoia.  10. Use Structured, Clear, Brief Communication Provide concise, concrete information. Avoid abstract explanations or complex instructions. Rationale: Simplifies processing during thought disturbance.  11. Avoid Touching Without Warning Always ask before touch or entering personal space. Say: I'm going to come closer now, is that okay? Rationale: Prevents misinterpretation and escalation in paranoid patients.  12. Document Behavior, Triggers, and Responses Delusional content Level of distress Triggers or patterns Response to  interventions Safety concerns Rationale: Ensures consistent, informed care across shifts.   Communication: Treatment team members (and family members if applicable) who were involved in treatment/care discussions and planning, and with whom we spoke or engaged with via secure text/chat, include the following: LCAS Jasmine  RN Dawn  RN Jon    I have discussed my assessment and treatment recommendations with the patient. Possible medication side effects/risks/benefits of current regimen.   Importance of medication adherence for medication to be beneficial.   Follow-Up Telepsychiatry C/L services:            []  We will continue to follow this patient with you.             [x]  Will sign off for now. Please re-consult our service as necessary.  Thank you for involving us  in the care of this patient. If you have any additional questions or concerns, please call [phone] and ask for me or the provider on-call.  TELEPSYCHIATRY ATTESTATION & CONSENT  As the provider for this telehealth consult, I attest that I verified the patients identity using two separate identifiers, introduced myself to the patient, provided my credentials, disclosed my location, and performed this encounter via a HIPAA-compliant, real-time, face-to-face, two-way, interactive audio and video platform and with the full consent and agreement of the patient (or guardian as applicable.)  Patient physical location: [place]. Telehealth provider physical location: home office in state of [place]  Video start time: 06:05 am  (Central Time) Video end time: 06:19 am (Central Time)  IDENTIFYING DATA  Emsley Custer is a 58 y.o. year-old female for whom a psychiatric consultation has been ordered by the primary provider. The patient was identified using two separate identifiers.  CHIEF COMPLAINT/REASON FOR CONSULT  Been having a problem with were I am staying for past 11 years   HISTORY OF PRESENT ILLNESS (HPI)  The patient  presents to ED via EMS from [facility] for worsening paranoia, A/V hallucinations, delusional thinking  Hx of treatment for  Schizophrenia  Currently prescribed: depakote  risperidone    Reports auditory and visual hallucinations I am sick of the place and don't want to go back there   She is tangential, pressured, paranoid states there are devil worshipers in the home, laughing and taunting her, see them walking through the walls, hears them talking about her reports people are stealing her stuff, she is distressed from her symptoms, agitated.  States her legal guardian is not helping her and she is going to [place] to talk with the President as she feels no one is helping her I will take a bus, these people are criminally insane and stealing from me, this can't go on.  Reports her father created [place], he is a emergency planning/management officer My name is on a lot of things in [place], I am a big part of [place] , the President will help me from having these people brutally murder me  She presents as anxious, ruminating worry  Client denies active SI/HI ideations, plans or intent. There is evidence of psychosis and delusional thinking. Experiencing A/V hallucinations  Client with episodes of hypomania, hyperactivity, erratic/excessive behavior, involvement in dangerous activities, self-inflated ego, grandiosity sleeping  tired of people looking at me with these flashlights, can't get too much of rest, people looking through stuff stealing stuff appetite good concentration decreased, racy ruminating thoughts  Reviewed active medication list/reviewed labs. Obtained Collateral information from medical record.   EKG not available for review during this encounter;  QTC on 07/08/24 was 455  Attempted to reach legal guardian Halie [phone] received voicemail, voicemail provided after hours number [phone], called number rang with no answer; or voicemail;  legal guardian's  voicemail left second after hours number [phone] called that number which went to  a voicemail that also directed to call after hours numbers [phone], [phone] Attempted to call [phone] again, this time went to a third voicemail that again provided both of these numbers as numbers to call after hours.   Attempted to call  [facility] at [phone] call went to voicemail   PAST PSYCHIATRIC HISTORY   Previous Psychiatric Hospitalizations: unknown at this time Previous Detox/Residential treatments:unknown at this time Outpt treatment:  unknown at this time Previous psychotropic medication trials: benztropine  gabapentin  hydroxyzine  quetiapine  risperidone   Previous mental health diagnosis per client/MEDICAL RECORD NUMBERschizophrenia  Suicide attempts/self-injurious behaviors:  unknown at this time  History of trauma/abuse/neglect/exploitation:  unknown at this time  PAST MEDICAL HISTORY  Past Medical History:  Diagnosis Date   Anemia    Blind    Breast cancer (HCC)    Diabetes mellitus without complication (HCC)    Hypertension    MDD (major depressive disorder)    Neuropathy 2010   Schizophrenia (HCC)      HOME MEDICATIONS  PTA Medications  Medication Sig   benztropine  (COGENTIN ) 0.5 MG tablet Take 0.5 mg by mouth 2 (two) times daily.   Vitamin D , Ergocalciferol , (DRISDOL ) 1.25 MG (50000 UNIT) CAPS capsule Take 50,000 Units by mouth every Saturday.   levocetirizine (XYZAL ) 5 MG tablet Take 5 mg by mouth daily.   polyethylene glycol (MIRALAX  / GLYCOLAX ) 17 g packet Take 17 g by mouth daily.   hydrOXYzine  (ATARAX ) 25 MG tablet Take 25 mg by mouth 3 (three) times daily.   acetaminophen  (TYLENOL ) 325 MG tablet Take 650 mg by mouth every 6 (six) hours as needed for mild pain or moderate pain.   albuterol  (VENTOLIN  HFA) 108 (90 Base) MCG/ACT inhaler Inhale 2 puffs into the lungs every 4 (four) hours as needed for wheezing or shortness of breath.   SYSTANE  ULTRA PF 0.4-0.3 % SOLN Place 2 drops into both eyes in the morning, at noon, and at bedtime.   tiotropium (SPIRIVA ) 18 MCG inhalation capsule Place 1 capsule (18 mcg total) into inhaler and inhale daily.   acetaZOLAMIDE  ER (DIAMOX ) 500 MG capsule Take 1 capsule (500 mg total) by mouth 2 (two) times daily.   amLODipine  (NORVASC ) 5 MG tablet Take 1 tablet (5 mg total) by mouth daily.   atorvastatin  (LIPITOR) 10 MG tablet Take 1 tablet (10 mg total) by mouth every evening.   colchicine  0.6 MG tablet Take 1 tablet (0.6 mg total) by mouth daily.   risperiDONE  (RISPERDAL  M-TABS) 3 MG disintegrating tablet Take 1 tablet (3 mg total) by mouth every morning.   QUEtiapine  (SEROQUEL  XR) 400 MG 24 hr tablet Take 2 tablets (800 mg total) by mouth at bedtime.   metFORMIN  (GLUCOPHAGE ) 1000 MG tablet Take 1 tablet (1,000 mg total) by mouth 2 (two) times daily with a meal.   potassium chloride  (KLOR-CON ) 10 MEQ tablet Take 1 tablet (10 mEq total) by mouth daily. Take While taking Diamox /acetazolamide    traMADol  (ULTRAM ) 50 MG tablet Take 1 tablet (50 mg total) by mouth every 8 (eight) hours as needed for severe pain or moderate pain.   gabapentin  (NEURONTIN ) 400 MG capsule Take 1 capsule (400 mg total) by mouth 3 (three) times daily.   insulin  glargine (LANTUS ) 100 UNIT/ML injection Inject 0.25 mLs (25 Units total) into the skin at bedtime.   tamoxifen  (NOLVADEX ) 20 MG tablet Take 1 tablet (20 mg total) by mouth daily.   sennosides-docusate sodium  (SENOKOT-S) 8.6-50 MG tablet Take 2 tablets by mouth at  bedtime.   Semaglutide ,0.25 or 0.5MG /DOS, (OZEMPIC , 0.25 OR 0.5 MG/DOSE,) 2 MG/1.5ML SOPN Inject 0.25 mg into the skin every Friday.   omeprazole  (PRILOSEC) 40 MG capsule TAKE ONE CAPSULE BY MOUTH ONCE DAILY. NEEDS OFFICE VISIT   NOVOLOG  FLEXPEN 100 UNIT/ML FlexPen Inject into the skin.   fluconazole (DIFLUCAN) 100 MG tablet Take 100 mg by mouth daily.   LANTUS  SOLOSTAR 100 UNIT/ML Solostar Pen Inject into the  skin.   divalproex  (DEPAKOTE ) 250 MG DR tablet Take 5 tablets (1,250 mg total) by mouth every evening.   risperiDONE  (RISPERDAL ) 1 MG tablet Take 0.5 tablets (0.5 mg total) by mouth 2 (two) times daily as needed (agitation).   triamcinolone  cream (KENALOG ) 0.1 % Apply 1 Application topically 2 (two) times daily.   ciprofloxacin  (CIPRO ) 500 MG tablet Take 1 tablet (500 mg total) by mouth 2 (two) times daily.      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     Have you used/abused any of the following (include frequency/amt/last use):  denied illicit drugs/alcohol , smokes tobacco  _  UDS not available during this encounter  BAL<15       FAMILY HISTORY  Family History  Problem Relation Age of Onset   Hypertension Mother    Cancer Mother    Diabetes Father    Family Psychiatric History (if known):  unknown at this time  MENTAL STATUS EXAM (MSE)  Mental Status Exam: General Appearance: Fairly Groomed  Orientation:  Full (Time, Place, and Person)  Memory:  Immediate;   Fair Recent;   Fair Remote;   Fair  Concentration:  Concentration: Poor  Recall:  Poor  Attention  Poor  Eye Contact:  Minimal  Speech:  Pressured  Language:  Good  Volume:  Normal  Mood: irritable, anxious  Affect:  Constricted  Thought Process:  Descriptions of Associations: Tangential  Thought Content:  Delusions, Hallucinations: Auditory Visual, Paranoid Ideation, and Rumination  Suicidal Thoughts:  No  Homicidal Thoughts:  No  Judgement:  Impaired  Insight:  Lacking  Psychomotor Activity:  Restlessness  Akathisia:  No  Fund of Knowledge:  Fair    Assets:  Biomedical Engineer Social Support  Cognition:  Impaired,  Mild  ADL's:  Impaired  AIMS (if indicated):       VITALS  Blood pressure  129/65, pulse 90, temperature 98.5 F (36.9 C), temperature source Oral, resp. rate 17, height 5' 7.5 (1.715 m), weight 90.3 kg, SpO2 100%.  LABS  Admission on 08/18/2024  Component Date Value Ref Range Status   WBC 08/18/2024 4.1  4.0 - 10.5 K/uL Final   RBC 08/18/2024 3.79 (L)  3.87 - 5.11 MIL/uL Final   Hemoglobin 08/18/2024 11.1 (L)  12.0 - 15.0 g/dL Final   HCT 87/71/7974 36.3  36.0 - 46.0 % Final   MCV 08/18/2024 95.8  80.0 - 100.0 fL Final   MCH 08/18/2024 29.3  26.0 - 34.0 pg Final   MCHC 08/18/2024 30.6  30.0 - 36.0 g/dL Final   RDW 87/71/7974 13.9  11.5 - 15.5 % Final   Platelets 08/18/2024 162  150 - 400 K/uL Final   nRBC 08/18/2024 0.0  0.0 - 0.2 % Final  Performed at [facility], [address] [place], [place] [address]   Sodium 08/18/2024 140  135 - 145 mmol/L Final   Potassium 08/18/2024 3.7  3.5 - 5.1 mmol/L Final   Chloride 08/18/2024 109  98 - 111 mmol/L Final   CO2 08/18/2024 19 (L)  22 - 32 mmol/L Final   Glucose, Bld 08/18/2024 234 (H)  70 - 99 mg/dL Final   Glucose reference range applies only to samples taken after fasting for at least 8 hours.   BUN 08/18/2024 26 (H)  6 - 20 mg/dL Final   Creatinine, Ser 08/18/2024 0.84  0.44 - 1.00 mg/dL Final   Calcium  08/18/2024 8.5 (L)  8.9 - 10.3 mg/dL Final   Total Protein 87/71/7974 6.9  6.5 - 8.1 g/dL Final   Albumin 87/71/7974 3.8  3.5 - 5.0 g/dL Final   AST 87/71/7974 18  15 - 41 U/L Final   ALT 08/18/2024 11  0 - 44 U/L Final   Alkaline Phosphatase 08/18/2024 94  38 - 126 U/L Final   Total Bilirubin 08/18/2024 0.3  0.0 - 1.2 mg/dL Final   GFR, Estimated 08/18/2024 >60  >60 mL/min Final   Comment: (NOTE) Calculated using the CKD-EPI Creatinine Equation (2021)    Anion gap 08/18/2024 13  5 - 15 Final   Performed at [facility], [address] [place], [place] [address]   Alcohol , Ethyl (B) 08/18/2024 <15  <15 mg/dL Final   Comment: (NOTE) For medical purposes only. Performed at  [facility], [address] [place], [place] [address]     PSYCHIATRIC REVIEW OF SYSTEMS (ROS)  Depression:      []  Denies all symptoms of depression [x] Depressed mood       [x] Insomnia/hypersomnia              [] Fatigue        [] Change in appetite     [] Anhedonia                                [x] Difficulty concentrating      [] Hopelessness             [] Worthlessness [] Guilt/shame                [] Psychomotor agitation/retardation   Mania:     [] Denies all symptoms of mania [] Elevated mood           [x] Irritability         [x] Pressured speech         [x]  Grandiosity         [x]  Decreased need for sleep                                                 [x] Increased energy          [x]  Increase in goal directed activity                                       [x] Flight of ideas    [x]  Excessive involvement in high-risk behaviors                   [x]  Distractibility     Psychosis:     []   Denies all symptoms of psychosis [x] Paranoia         [x]  Auditory Hallucinations          [x] Visual hallucinations         [] ELOC        [] IOR                [x] Delusions   Suicide:    [x]  Denies SI/plan/intent []  Passive SI         []   Active SI         [] Plan           [] Intent   Homicide:  [x]   Denies HI/plan/intent []  Passive HI         []  Active HI         [] Plan            [] Intent           [] Identified Target    Additional findings:      Musculoskeletal: No abnormal movements observed      Gait & Station: Laying/Sitting needs assistance due to vision impairment      Pain Screening: Present - mild to moderate      Nutrition & Dental Concerns: none reported  RISK FORMULATION/ASSESSMENT  [place]-Suicide Severity Rating Scale (C-SSRS)  1) Have you wished you were dead or wished you could go to sleep and not wake up?  no 2) Have you actually had any thoughts about killing yourself? no     Is the patient experiencing any suicidal or homicidal ideations:          [x] NO         Protective factors considered for safety management:    Access to adequate health care Advice& help seeking Spirituality Positive social support: Positive therapeutic relationship Future oriented Suicide Inquiry:  Denies suicidal ideations, intentions, or plans.  Denies  recent self-harm behavior. Talks futuristically.  Risk factors/concerns considered for safety management:  [] Prior attempt                                      [] Hopelessness  [] Family history of suicide                    [x] Impulsivity [] Depression                                         [x] Aggression [] Substance abuse/dependence          [] Isolation [x] Physical illness/chronic pain              [] Barriers to accessing treatment [] Recent loss                                        [] Unwillingness to seek help [x] Access to lethal means                      [] Female gender [x] Age over 96                                        [x] Unmarried   Is there a safety management plan with the  patient and treatment team to minimize risk factors and promote protective factors:     [x] YES          []  NO            Explain: safety obs, admit to inpt psychiatry unit     Is crisis care placement or psychiatric hospitalization recommended:  [x] YES    [] NO  Based on my current evaluation and risk assessment, patient is determined at this time to be ju:Yphy risk  Severe impairment of reality assessment, judgment, logical thinking and planning that result in being an imminent danger to self or others.     *RISK ASSESSMENT Risk assessment is a dynamic process; it is possible that this patient's condition, and risk level, may change. This should be re-evaluated and managed over time as appropriate. Please re-consult psychiatric consult services if additional assistance is needed in terms of risk assessment and management. If your team decides to discharge this patient, please advise the patient how to best access emergency psychiatric  services, or to call 911, if their condition worsens or they feel unsafe in any way.  I personally spent a total of 60 minutes in the care of the patient today including preparing to see the patient, getting/reviewing separately obtained history, performing a medically appropriate exam/evaluation, counseling and educating, referring and communicating with other health care professionals, documenting clinical information in the EHR, independently interpreting results, and coordinating care.      Dr. Garnet JUDITHANN Ada, PhD, MSN, APRN, PMHNP-BC, [facility]  KANDICE Ada, NP Telepsychiatry Consult Services      [1]  Allergies Allergen Reactions   Ziprasidone  Hcl Other (See Comments)    Psychotic reaction   Penicillinase Rash and Swelling   Sulfa Antibiotics Other (See Comments) and Hives   Sulfamethoxazole-Trimethoprim Hives and Other (See Comments)    Dermatitis Bullous lesions   Penicillins Other (See Comments)    Unknown     Latex Itching

## 2024-08-19 NOTE — ED Notes (Signed)
 Pt assisted to the bathroom using a walker. No further needs at this time.

## 2024-08-19 NOTE — ED Notes (Addendum)
 Pts dinner tray still not here, 3rd call placed to dietary. Dietary stated they would resend it and was not sure what happened to it.

## 2024-08-19 NOTE — ED Notes (Signed)
 Lab unable to get blood from pt at this time. They will come back and try again later.

## 2024-08-20 LAB — CBG MONITORING, ED: Glucose-Capillary: 369 mg/dL — ABNORMAL HIGH (ref 70–99)

## 2024-08-20 MED ORDER — ATORVASTATIN CALCIUM 20 MG PO TABS
10.0000 mg | ORAL_TABLET | Freq: Every evening | ORAL | Status: DC
  Administered 2024-08-20: 10 mg via ORAL
  Filled 2024-08-20: qty 1

## 2024-08-20 MED ORDER — ASPIRIN 81 MG PO TBEC
81.0000 mg | DELAYED_RELEASE_TABLET | Freq: Every day | ORAL | Status: DC
  Administered 2024-08-20 – 2024-08-21 (×2): 81 mg via ORAL
  Filled 2024-08-20 (×2): qty 1

## 2024-08-20 MED ORDER — FUROSEMIDE 40 MG PO TABS
20.0000 mg | ORAL_TABLET | Freq: Two times a day (BID) | ORAL | Status: DC
  Administered 2024-08-20 – 2024-08-21 (×2): 20 mg via ORAL
  Filled 2024-08-20 (×2): qty 1

## 2024-08-20 MED ORDER — INSULIN ASPART 100 UNIT/ML FLEXPEN: 11.0000 [IU] | PEN_INJECTOR | Freq: Three times a day (TID) | SUBCUTANEOUS | Status: DC

## 2024-08-20 MED ORDER — COLCHICINE 0.6 MG PO TABS
0.6000 mg | ORAL_TABLET | Freq: Every day | ORAL | Status: DC
  Administered 2024-08-20 – 2024-08-21 (×2): 0.6 mg via ORAL
  Filled 2024-08-20 (×2): qty 1

## 2024-08-20 MED ORDER — BENZTROPINE MESYLATE 1 MG PO TABS
0.5000 mg | ORAL_TABLET | Freq: Two times a day (BID) | ORAL | Status: DC
  Administered 2024-08-20 – 2024-08-21 (×3): 0.5 mg via ORAL
  Filled 2024-08-20 (×3): qty 1

## 2024-08-20 MED ORDER — HYDROXYZINE HCL 25 MG PO TABS
25.0000 mg | ORAL_TABLET | Freq: Three times a day (TID) | ORAL | Status: DC
  Administered 2024-08-20 – 2024-08-21 (×3): 25 mg via ORAL
  Filled 2024-08-20 (×3): qty 1

## 2024-08-20 MED ORDER — LORATADINE 10 MG PO TABS
10.0000 mg | ORAL_TABLET | Freq: Every day | ORAL | Status: DC
  Administered 2024-08-20 – 2024-08-21 (×2): 10 mg via ORAL
  Filled 2024-08-20 (×2): qty 1

## 2024-08-20 MED ORDER — GABAPENTIN 300 MG PO CAPS
400.0000 mg | ORAL_CAPSULE | Freq: Three times a day (TID) | ORAL | Status: DC
  Administered 2024-08-20 – 2024-08-21 (×3): 400 mg via ORAL
  Filled 2024-08-20 (×3): qty 1

## 2024-08-20 MED ORDER — PANTOPRAZOLE SODIUM 40 MG PO TBEC
40.0000 mg | DELAYED_RELEASE_TABLET | Freq: Every day | ORAL | Status: DC
  Administered 2024-08-20 – 2024-08-21 (×2): 40 mg via ORAL
  Filled 2024-08-20 (×2): qty 1

## 2024-08-20 MED ORDER — VITAMIN D (ERGOCALCIFEROL) 1.25 MG (50000 UNIT) PO CAPS: 50000.0000 [IU] | ORAL_CAPSULE | ORAL | Status: DC

## 2024-08-20 MED ORDER — POTASSIUM CHLORIDE ER 10 MEQ PO TBCR: 10.0000 meq | EXTENDED_RELEASE_TABLET | Freq: Every day | ORAL | Status: DC

## 2024-08-20 MED ORDER — ACETAZOLAMIDE ER 500 MG PO CP12
500.0000 mg | ORAL_CAPSULE | Freq: Two times a day (BID) | ORAL | Status: DC
  Administered 2024-08-20 – 2024-08-21 (×2): 500 mg via ORAL
  Filled 2024-08-20 (×2): qty 1

## 2024-08-20 MED ORDER — QUETIAPINE FUMARATE ER 300 MG PO TB24
800.0000 mg | ORAL_TABLET | Freq: Every day | ORAL | Status: DC
  Administered 2024-08-20: 800 mg via ORAL
  Filled 2024-08-20: qty 1

## 2024-08-20 MED ORDER — AMLODIPINE BESYLATE 5 MG PO TABS
5.0000 mg | ORAL_TABLET | Freq: Every day | ORAL | Status: DC
  Administered 2024-08-20 – 2024-08-21 (×2): 5 mg via ORAL
  Filled 2024-08-20 (×2): qty 1

## 2024-08-20 MED ORDER — TIOTROPIUM BROMIDE MONOHYDRATE 18 MCG IN CAPS: 18.0000 ug | ORAL_CAPSULE | Freq: Every day | RESPIRATORY_TRACT | Status: DC

## 2024-08-20 MED ORDER — LOSARTAN POTASSIUM 50 MG PO TABS
25.0000 mg | ORAL_TABLET | Freq: Every day | ORAL | Status: DC
  Administered 2024-08-20 – 2024-08-21 (×2): 25 mg via ORAL
  Filled 2024-08-20 (×2): qty 1

## 2024-08-20 MED ORDER — POTASSIUM CHLORIDE CRYS ER 20 MEQ PO TBCR
10.0000 meq | EXTENDED_RELEASE_TABLET | Freq: Every day | ORAL | Status: DC
  Administered 2024-08-20 – 2024-08-21 (×2): 10 meq via ORAL
  Filled 2024-08-20 (×2): qty 1

## 2024-08-20 MED ORDER — INSULIN GLARGINE 100 UNIT/ML ~~LOC~~ SOLN
25.0000 [IU] | Freq: Every day | SUBCUTANEOUS | Status: DC
  Administered 2024-08-20: 25 [IU] via SUBCUTANEOUS
  Filled 2024-08-20: qty 0.25

## 2024-08-20 MED ORDER — LEVOCETIRIZINE DIHYDROCHLORIDE 5 MG PO TABS: 5.0000 mg | ORAL_TABLET | Freq: Every day | ORAL | Status: DC

## 2024-08-20 MED ORDER — UMECLIDINIUM BROMIDE 62.5 MCG/ACT IN AEPB
1.0000 | INHALATION_SPRAY | Freq: Every day | RESPIRATORY_TRACT | Status: DC
  Administered 2024-08-20 – 2024-08-21 (×2): 1 via RESPIRATORY_TRACT
  Filled 2024-08-20: qty 7

## 2024-08-20 MED ORDER — CLONIDINE HCL 0.1 MG PO TABS
0.1000 mg | ORAL_TABLET | Freq: Two times a day (BID) | ORAL | Status: DC
  Administered 2024-08-20 – 2024-08-21 (×3): 0.1 mg via ORAL
  Filled 2024-08-20 (×3): qty 1

## 2024-08-20 MED ORDER — TRIAMCINOLONE ACETONIDE 0.1 % EX CREA
1.0000 | TOPICAL_CREAM | Freq: Two times a day (BID) | CUTANEOUS | Status: DC
  Administered 2024-08-20 – 2024-08-21 (×2): 1 via TOPICAL
  Filled 2024-08-20: qty 15

## 2024-08-20 MED ORDER — SENNOSIDES-DOCUSATE SODIUM 8.6-50 MG PO TABS
2.0000 | ORAL_TABLET | Freq: Every day | ORAL | Status: DC
  Administered 2024-08-20: 2 via ORAL
  Filled 2024-08-20: qty 2

## 2024-08-20 MED ORDER — TIOTROPIUM BROMIDE 18 MCG IN CAPS: 1.0000 | ORAL_CAPSULE | Freq: Every day | RESPIRATORY_TRACT | Status: DC

## 2024-08-20 MED ORDER — IBUPROFEN 800 MG PO TABS
800.0000 mg | ORAL_TABLET | Freq: Three times a day (TID) | ORAL | Status: DC
  Administered 2024-08-20 – 2024-08-21 (×3): 800 mg via ORAL
  Filled 2024-08-20 (×3): qty 1

## 2024-08-20 MED ORDER — INSULIN ASPART 100 UNIT/ML IJ SOLN
11.0000 [IU] | Freq: Three times a day (TID) | INTRAMUSCULAR | Status: DC
  Administered 2024-08-20 – 2024-08-21 (×2): 15 [IU] via SUBCUTANEOUS
  Filled 2024-08-20 (×2): qty 15

## 2024-08-20 MED ORDER — DIVALPROEX SODIUM 500 MG PO DR TAB
1250.0000 mg | DELAYED_RELEASE_TABLET | Freq: Every evening | ORAL | Status: DC
  Administered 2024-08-20: 1250 mg via ORAL
  Filled 2024-08-20: qty 1

## 2024-08-20 NOTE — ED Notes (Signed)
 Pt provided scheduled meal, meal was inspected for unsafe materials and materials adjusted accordingly. Pt now eating meal.

## 2024-08-20 NOTE — ED Notes (Signed)
Meds given  pt calm and cooperative.

## 2024-08-20 NOTE — ED Provider Notes (Signed)
 Emergency Medicine Observation Re-evaluation Note  Regina Watson is a 58 y.o. female, seen on rounds today.  Pt initially presented to the ED for complaints of Hallucinations (bac) and Back Pain Currently, the patient is resting comfortably in bed.  Physical Exam  BP 139/69 (BP Location: Right Arm)   Pulse 80   Temp 97.9 F (36.6 C) (Oral)   Resp 18   Ht 5' 7.5 (1.715 m)   Wt 90.3 kg   SpO2 98%   BMI 30.71 kg/m  Physical Exam General: No acute distress Psych: Calm and cooperative  ED Course / MDM   There have been no changes to the patient's status in last 24 hours.  Plan  Current plan is for placement for inpatient psychiatric admission.    Jacolyn Pae, MD 08/20/24 2128

## 2024-08-20 NOTE — Progress Notes (Signed)
 Patient has been faxed to the following facilities:  Service Provider Phone  [facility]  [phone]  [facility]  [phone]  [facility]  [phone]  [facility]  [phone]  [facility]  [phone]  [facility]  [phone]  [facility]  [phone]  [facility]  [phone]  [facility]  [phone]  [facility]  [phone]  [facility]  [phone]  [facility]  [phone]  [facility]  [phone]  [facility]  [phone]  [facility]  [phone]  [facility]  [phone]  [facility]  [phone], [place] [phone]

## 2024-08-20 NOTE — ED Notes (Signed)
 Pt eating dinner meal  ?

## 2024-08-20 NOTE — ED Notes (Addendum)
 Pt provided with lunch tray. Pt is sleeping at this time. Tray left at bedside.

## 2024-08-20 NOTE — ED Notes (Signed)
 Report given to Levorn PEAK at [place].

## 2024-08-20 NOTE — BH Assessment (Signed)
 Patient has been accepted to [facility] on tomm 08/21/24 after 8:00am.  Patient assigned to [facility]. Accepting physician is Dr. Millie Manners.  Call report to [phone], option 2, leave voicemail.  Representative was [employer].   ER Staff is aware of it:  Museum/gallery Exhibitions Officer  Dr. Rexford, ER MD  Rudell, Patient's Nurse     Writer left message for Patient's Family/Support System [facility] Jerel- legal guardian [phone]) to return call.

## 2024-08-20 NOTE — ED Notes (Signed)
 VOL  GOING TO  [facility]  ON 08/21/24

## 2024-08-20 NOTE — ED Notes (Signed)
 Fsbs [id number]

## 2024-08-20 NOTE — Consult Note (Signed)
 [facility] Psychiatric Consult Follow-up  Patient Name: [name]  MRN: [id number]  DOB: 02/02/1966  Consult Order details:  Orders (From admission, onward)     Start     Ordered   08/18/24 2126  CONSULT TO CALL ACT TEAM       Ordering Provider: Dorothyann Drivers, MD  Provider:  (Not yet assigned)  Question:  Reason for Consult?  Answer:  Psych consult   08/18/24 2125   08/18/24 2126  IP CONSULT TO PSYCHIATRY       Ordering Provider: Dorothyann Drivers, MD  Provider:  (Not yet assigned)  Question Answer Comment  Consult Timeframe ROUTINE - requires response within 24 hours   Reason for Consult? Consult for medication management   Contact phone number where the requesting provider can be reached [phone]      08/18/24 2125             Mode of Visit: In person  Patient was initially seen by [facility] telehealth providers who recommended inpatient psychiatric admission. Patient has been holding in the emergency department awaiting appropriate bed placement. Per report, patient has now been accepted to an inpatient psychiatric facility.  Patient was seen on rounds today by psychiatry. Patient denied current suicidal or homicidal ideation. She denied auditory or visual hallucinations at this time. Per previous provider's recommendation, patient will transition to inpatient psychiatric facility pending appropriate transportation arrangements.  Zelda Sharps, NP This note was created using Dragon dictation software. Please excuse any inadvertent transcription errors. Case was discussed with supervising physician Dr. Hellen who is agreeable with current plan.

## 2024-08-20 NOTE — ED Notes (Signed)
 Pt assisted to bathroom with walker. Pt urinated and had BM in toilet. Pt was helped with putting on new brief and was assisted back to bed. Pt was set up for breakfast and is eating at this time. No other needs verbalized at this time.

## 2024-08-20 NOTE — ED Notes (Signed)
 vol/recommended for psychiatric inpatient admission.

## 2024-08-20 NOTE — ED Notes (Signed)
 Pt given breakfast tray

## 2024-08-21 LAB — CBG MONITORING, ED
Glucose-Capillary: 372 mg/dL — ABNORMAL HIGH (ref 70–99)
Glucose-Capillary: 387 mg/dL — ABNORMAL HIGH (ref 70–99)

## 2024-08-21 NOTE — ED Notes (Signed)
 Safe  transport  called   for  transfer  to  [facility]

## 2024-08-21 NOTE — ED Provider Notes (Signed)
----------------------------------------- °  5:38 AM on 08/21/2024 -----------------------------------------  Patient accepted to [facility] for transfer later today   Gordan Huxley, MD 08/21/24 [phone]

## 2024-08-21 NOTE — ED Notes (Signed)
 Pt given hospital provided breakfast.

## 2024-09-24 ENCOUNTER — Inpatient Hospital Stay (HOSPITAL_COMMUNITY)
Admission: EM | Admit: 2024-09-24 | Discharge: 2024-09-26 | DRG: 638 | Disposition: A | Payer: MEDICAID | Source: Skilled Nursing Facility | Attending: Emergency Medicine | Admitting: Emergency Medicine

## 2024-09-24 ENCOUNTER — Ambulatory Visit: Payer: MEDICAID | Admitting: Podiatry

## 2024-09-24 ENCOUNTER — Emergency Department (HOSPITAL_COMMUNITY): Payer: MEDICAID

## 2024-09-24 ENCOUNTER — Encounter (HOSPITAL_COMMUNITY): Payer: Self-pay

## 2024-09-24 ENCOUNTER — Other Ambulatory Visit: Payer: Self-pay

## 2024-09-24 DIAGNOSIS — Z882 Allergy status to sulfonamides status: Secondary | ICD-10-CM

## 2024-09-24 DIAGNOSIS — Z7984 Long term (current) use of oral hypoglycemic drugs: Secondary | ICD-10-CM

## 2024-09-24 DIAGNOSIS — Z853 Personal history of malignant neoplasm of breast: Secondary | ICD-10-CM

## 2024-09-24 DIAGNOSIS — R634 Abnormal weight loss: Secondary | ICD-10-CM

## 2024-09-24 DIAGNOSIS — Z79899 Other long term (current) drug therapy: Secondary | ICD-10-CM

## 2024-09-24 DIAGNOSIS — E872 Acidosis, unspecified: Secondary | ICD-10-CM | POA: Diagnosis present

## 2024-09-24 DIAGNOSIS — Z8249 Family history of ischemic heart disease and other diseases of the circulatory system: Secondary | ICD-10-CM

## 2024-09-24 DIAGNOSIS — E876 Hypokalemia: Secondary | ICD-10-CM | POA: Diagnosis not present

## 2024-09-24 DIAGNOSIS — Z888 Allergy status to other drugs, medicaments and biological substances status: Secondary | ICD-10-CM

## 2024-09-24 DIAGNOSIS — Z881 Allergy status to other antibiotic agents status: Secondary | ICD-10-CM

## 2024-09-24 DIAGNOSIS — E1142 Type 2 diabetes mellitus with diabetic polyneuropathy: Secondary | ICD-10-CM | POA: Diagnosis present

## 2024-09-24 DIAGNOSIS — L02612 Cutaneous abscess of left foot: Secondary | ICD-10-CM | POA: Diagnosis present

## 2024-09-24 DIAGNOSIS — E11628 Type 2 diabetes mellitus with other skin complications: Principal | ICD-10-CM | POA: Diagnosis present

## 2024-09-24 DIAGNOSIS — F329 Major depressive disorder, single episode, unspecified: Secondary | ICD-10-CM | POA: Diagnosis present

## 2024-09-24 DIAGNOSIS — Z7982 Long term (current) use of aspirin: Secondary | ICD-10-CM

## 2024-09-24 DIAGNOSIS — Z7951 Long term (current) use of inhaled steroids: Secondary | ICD-10-CM

## 2024-09-24 DIAGNOSIS — Z91040 Latex allergy status: Secondary | ICD-10-CM

## 2024-09-24 DIAGNOSIS — I10 Essential (primary) hypertension: Secondary | ICD-10-CM | POA: Diagnosis present

## 2024-09-24 DIAGNOSIS — E1165 Type 2 diabetes mellitus with hyperglycemia: Secondary | ICD-10-CM | POA: Diagnosis present

## 2024-09-24 DIAGNOSIS — H547 Unspecified visual loss: Secondary | ICD-10-CM | POA: Diagnosis present

## 2024-09-24 DIAGNOSIS — E0842 Diabetes mellitus due to underlying condition with diabetic polyneuropathy: Secondary | ICD-10-CM

## 2024-09-24 DIAGNOSIS — E669 Obesity, unspecified: Secondary | ICD-10-CM | POA: Diagnosis present

## 2024-09-24 DIAGNOSIS — R262 Difficulty in walking, not elsewhere classified: Secondary | ICD-10-CM | POA: Diagnosis present

## 2024-09-24 DIAGNOSIS — F17210 Nicotine dependence, cigarettes, uncomplicated: Secondary | ICD-10-CM | POA: Diagnosis present

## 2024-09-24 DIAGNOSIS — Z6830 Body mass index (BMI) 30.0-30.9, adult: Secondary | ICD-10-CM

## 2024-09-24 DIAGNOSIS — Z880 Allergy status to penicillin: Secondary | ICD-10-CM

## 2024-09-24 DIAGNOSIS — L03116 Cellulitis of left lower limb: Secondary | ICD-10-CM | POA: Diagnosis present

## 2024-09-24 DIAGNOSIS — F209 Schizophrenia, unspecified: Secondary | ICD-10-CM | POA: Diagnosis present

## 2024-09-24 DIAGNOSIS — Z79810 Long term (current) use of selective estrogen receptor modulators (SERMs): Secondary | ICD-10-CM

## 2024-09-24 DIAGNOSIS — Z833 Family history of diabetes mellitus: Secondary | ICD-10-CM

## 2024-09-24 DIAGNOSIS — Z794 Long term (current) use of insulin: Secondary | ICD-10-CM

## 2024-09-24 NOTE — ED Triage Notes (Signed)
 Patient from [facility] for a blister on the bottom of her L foot that has busted. Patient unsure of how long the blister has been there. 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 Patient denies any pain. History of blindness. Upon arrival to ER, patient is alert and oriented

## 2024-09-25 ENCOUNTER — Inpatient Hospital Stay (HOSPITAL_COMMUNITY): Payer: MEDICAID

## 2024-09-25 DIAGNOSIS — E11628 Type 2 diabetes mellitus with other skin complications: Secondary | ICD-10-CM | POA: Diagnosis not present

## 2024-09-25 DIAGNOSIS — L089 Local infection of the skin and subcutaneous tissue, unspecified: Secondary | ICD-10-CM | POA: Diagnosis not present

## 2024-09-25 LAB — CBG MONITORING, ED
Glucose-Capillary: 316 mg/dL — ABNORMAL HIGH (ref 70–99)
Glucose-Capillary: 329 mg/dL — ABNORMAL HIGH (ref 70–99)
Glucose-Capillary: 208 mg/dL — ABNORMAL HIGH (ref 70–99)

## 2024-09-25 LAB — COMPREHENSIVE METABOLIC PANEL WITH GFR
ALT: 11 U/L (ref 0–44)
AST: 15 U/L (ref 15–41)
Albumin: 3.8 g/dL (ref 3.5–5.0)
Alkaline Phosphatase: 129 U/L — ABNORMAL HIGH (ref 38–126)
Anion gap: 13 (ref 5–15)
BUN: 17 mg/dL (ref 6–20)
CO2: 20 mmol/L — ABNORMAL LOW (ref 22–32)
Calcium: 8.9 mg/dL (ref 8.9–10.3)
Chloride: 107 mmol/L (ref 98–111)
Creatinine, Ser: 0.83 mg/dL (ref 0.44–1.00)
GFR, Estimated: >60 mL/min
Glucose, Bld: 372 mg/dL — ABNORMAL HIGH (ref 70–99)
Potassium: 4.1 mmol/L (ref 3.5–5.1)
Sodium: 140 mmol/L (ref 135–145)
Total Bilirubin: 0.3 mg/dL (ref 0.0–1.2)
Total Protein: 8.3 g/dL — ABNORMAL HIGH (ref 6.5–8.1)

## 2024-09-25 LAB — CBC WITH DIFFERENTIAL/PLATELET
Abs Immature Granulocytes: 0.04 10*3/uL (ref 0.00–0.07)
Basophils Absolute: 0 10*3/uL (ref 0.0–0.1)
Basophils Relative: 0 %
Eosinophils Absolute: 0.3 10*3/uL (ref 0.0–0.5)
Eosinophils Relative: 5 %
HCT: 38.4 % (ref 36.0–46.0)
Hemoglobin: 11.8 g/dL — ABNORMAL LOW (ref 12.0–15.0)
Immature Granulocytes: 1 %
Lymphocytes Relative: 24 %
Lymphs Abs: 1.3 10*3/uL (ref 0.7–4.0)
MCH: 30.3 pg (ref 26.0–34.0)
MCHC: 30.7 g/dL (ref 30.0–36.0)
MCV: 98.5 fL (ref 80.0–100.0)
Monocytes Absolute: 0.4 10*3/uL (ref 0.1–1.0)
Monocytes Relative: 7 %
Neutro Abs: 3.6 10*3/uL (ref 1.7–7.7)
Neutrophils Relative %: 63 %
Platelets: 196 10*3/uL (ref 150–400)
RBC: 3.9 MIL/uL (ref 3.87–5.11)
RDW: 13.1 % (ref 11.5–15.5)
WBC: 5.7 10*3/uL (ref 4.0–10.5)
nRBC: 0 % (ref 0.0–0.2)

## 2024-09-25 LAB — GLUCOSE, CAPILLARY
Glucose-Capillary: 132 mg/dL — ABNORMAL HIGH (ref 70–99)
Glucose-Capillary: 191 mg/dL — ABNORMAL HIGH (ref 70–99)

## 2024-09-25 LAB — SEDIMENTATION RATE: Sed Rate: 19 mm/h (ref 0–30)

## 2024-09-25 LAB — HEMOGLOBIN A1C
Hgb A1c MFr Bld: 10 % — ABNORMAL HIGH (ref 4.8–5.6)
Mean Plasma Glucose: 240.3 mg/dL

## 2024-09-25 LAB — LACTIC ACID, PLASMA: Lactic Acid, Venous: 3.1 mmol/L (ref 0.5–1.9)

## 2024-09-25 LAB — HIV ANTIBODY (ROUTINE TESTING W REFLEX): HIV Screen 4th Generation wRfx: NONREACTIVE

## 2024-09-25 LAB — MRSA NEXT GEN BY PCR, NASAL: MRSA by PCR Next Gen: NOT DETECTED

## 2024-09-25 MED ORDER — VANCOMYCIN HCL 2000 MG/400ML IV SOLN
2000.0000 mg | Freq: Once | INTRAVENOUS | Status: AC
  Administered 2024-09-25: 2000 mg via INTRAVENOUS
  Filled 2024-09-25: qty 400

## 2024-09-25 MED ORDER — LIVING WELL WITH DIABETES BOOK
Freq: Once | Status: DC
  Filled 2024-09-25: qty 1

## 2024-09-25 MED ORDER — SODIUM CHLORIDE 0.9 % IV SOLN
2.0000 g | Freq: Once | INTRAVENOUS | Status: DC
  Administered 2024-09-25: 2 g via INTRAVENOUS
  Filled 2024-09-25: qty 20

## 2024-09-25 MED ORDER — GADOBUTROL 1 MMOL/ML IV SOLN
9.0000 mL | Freq: Once | INTRAVENOUS | Status: AC | PRN
  Administered 2024-09-25: 9 mL via INTRAVENOUS

## 2024-09-25 MED ORDER — TAMOXIFEN CITRATE 10 MG PO TABS
20.0000 mg | ORAL_TABLET | Freq: Every day | ORAL | Status: DC
  Administered 2024-09-26: 20 mg via ORAL
  Filled 2024-09-25 (×3): qty 2

## 2024-09-25 MED ORDER — LACTATED RINGERS IV SOLN: INTRAVENOUS | Status: AC

## 2024-09-25 MED ORDER — METRONIDAZOLE 500 MG/100ML IV SOLN
500.0000 mg | Freq: Two times a day (BID) | INTRAVENOUS | Status: DC
  Administered 2024-09-25 (×2): 500 mg via INTRAVENOUS
  Filled 2024-09-25 (×2): qty 100

## 2024-09-25 MED ORDER — QUETIAPINE FUMARATE ER 200 MG PO TB24
800.0000 mg | ORAL_TABLET | Freq: Every day | ORAL | Status: DC
  Administered 2024-09-25: 800 mg via ORAL
  Filled 2024-09-25: qty 4
  Filled 2024-09-25: qty 2

## 2024-09-25 MED ORDER — SODIUM CHLORIDE 0.9 % IV SOLN
2.0000 g | Freq: Once | INTRAVENOUS | Status: AC
  Administered 2024-09-25: 2 g via INTRAVENOUS
  Filled 2024-09-25: qty 12.5

## 2024-09-25 MED ORDER — HYDROXYZINE HCL 25 MG PO TABS
25.0000 mg | ORAL_TABLET | Freq: Three times a day (TID) | ORAL | Status: DC
  Administered 2024-09-25 – 2024-09-26 (×4): 25 mg via ORAL
  Filled 2024-09-25 (×4): qty 1

## 2024-09-25 MED ORDER — LACTATED RINGERS IV BOLUS
1000.0000 mL | Freq: Once | INTRAVENOUS | Status: AC
  Administered 2024-09-25: 1000 mL via INTRAVENOUS

## 2024-09-25 MED ORDER — PANTOPRAZOLE SODIUM 40 MG PO TBEC
40.0000 mg | DELAYED_RELEASE_TABLET | Freq: Every day | ORAL | Status: DC
  Administered 2024-09-25 – 2024-09-26 (×2): 40 mg via ORAL
  Filled 2024-09-25 (×2): qty 1

## 2024-09-25 MED ORDER — INSULIN ASPART 100 UNIT/ML IJ SOLN
0.0000 [IU] | Freq: Every day | INTRAMUSCULAR | Status: DC
  Administered 2024-09-25: 4 [IU] via SUBCUTANEOUS
  Filled 2024-09-25: qty 1

## 2024-09-25 MED ORDER — UMECLIDINIUM BROMIDE 62.5 MCG/ACT IN AEPB
1.0000 | INHALATION_SPRAY | Freq: Every day | RESPIRATORY_TRACT | Status: DC
  Administered 2024-09-25 – 2024-09-26 (×2): 1 via RESPIRATORY_TRACT
  Filled 2024-09-25: qty 7

## 2024-09-25 MED ORDER — INSULIN GLARGINE-YFGN 100 UNIT/ML ~~LOC~~ SOLN
20.0000 [IU] | Freq: Two times a day (BID) | SUBCUTANEOUS | Status: DC
  Administered 2024-09-25 – 2024-09-26 (×2): 20 [IU] via SUBCUTANEOUS
  Filled 2024-09-25 (×5): qty 0.2

## 2024-09-25 MED ORDER — PROCHLORPERAZINE EDISYLATE 10 MG/2ML IJ SOLN: 5.0000 mg | Freq: Four times a day (QID) | INTRAMUSCULAR | Status: DC | PRN

## 2024-09-25 MED ORDER — ACETAMINOPHEN 325 MG PO TABS: 650.0000 mg | ORAL_TABLET | Freq: Four times a day (QID) | ORAL | Status: DC | PRN

## 2024-09-25 MED ORDER — BENZTROPINE MESYLATE 1 MG PO TABS
0.5000 mg | ORAL_TABLET | Freq: Two times a day (BID) | ORAL | Status: DC
  Administered 2024-09-25 – 2024-09-26 (×3): 0.5 mg via ORAL
  Filled 2024-09-25 (×3): qty 1

## 2024-09-25 MED ORDER — MELATONIN 3 MG PO TABS
6.0000 mg | ORAL_TABLET | Freq: Every evening | ORAL | Status: DC | PRN
  Administered 2024-09-25: 6 mg via ORAL
  Filled 2024-09-25: qty 2

## 2024-09-25 MED ORDER — MORPHINE SULFATE (PF) 2 MG/ML IV SOLN
2.0000 mg | INTRAVENOUS | Status: DC | PRN
  Administered 2024-09-25: 2 mg via INTRAVENOUS
  Filled 2024-09-25: qty 1

## 2024-09-25 MED ORDER — LORAZEPAM 2 MG/ML IJ SOLN
1.0000 mg | Freq: Once | INTRAMUSCULAR | Status: AC
  Administered 2024-09-25: 1 mg via INTRAVENOUS
  Filled 2024-09-25: qty 1

## 2024-09-25 MED ORDER — VANCOMYCIN HCL IN DEXTROSE 1-5 GM/200ML-% IV SOLN
1000.0000 mg | Freq: Once | INTRAVENOUS | Status: DC
  Filled 2024-09-25: qty 200

## 2024-09-25 MED ORDER — INSULIN ASPART 100 UNIT/ML IJ SOLN
5.0000 [IU] | Freq: Three times a day (TID) | INTRAMUSCULAR | Status: DC
  Administered 2024-09-25 – 2024-09-26 (×5): 5 [IU] via SUBCUTANEOUS
  Filled 2024-09-25 (×5): qty 1

## 2024-09-25 MED ORDER — INSULIN ASPART 100 UNIT/ML IJ SOLN
0.0000 [IU] | Freq: Three times a day (TID) | INTRAMUSCULAR | Status: DC
  Administered 2024-09-25: 20 [IU] via SUBCUTANEOUS
  Administered 2024-09-25: 3 [IU] via SUBCUTANEOUS
  Administered 2024-09-25: 7 [IU] via SUBCUTANEOUS
  Administered 2024-09-26: 11 [IU] via SUBCUTANEOUS
  Administered 2024-09-26: 4 [IU] via SUBCUTANEOUS
  Filled 2024-09-25 (×5): qty 1

## 2024-09-25 MED ORDER — METRONIDAZOLE 500 MG/100ML IV SOLN
500.0000 mg | Freq: Once | INTRAVENOUS | Status: AC
  Administered 2024-09-25: 500 mg via INTRAVENOUS
  Filled 2024-09-25: qty 100

## 2024-09-25 MED ORDER — VANCOMYCIN HCL IN DEXTROSE 1-5 GM/200ML-% IV SOLN
1000.0000 mg | Freq: Two times a day (BID) | INTRAVENOUS | Status: DC
  Administered 2024-09-25 (×2): 1000 mg via INTRAVENOUS
  Filled 2024-09-25 (×2): qty 200

## 2024-09-25 MED ORDER — TIOTROPIUM BROMIDE MONOHYDRATE 18 MCG IN CAPS: 18.0000 ug | ORAL_CAPSULE | Freq: Every day | RESPIRATORY_TRACT | Status: DC

## 2024-09-25 MED ORDER — INSULIN GLARGINE-YFGN 100 UNIT/ML ~~LOC~~ SOLN
10.0000 [IU] | Freq: Two times a day (BID) | SUBCUTANEOUS | Status: DC
  Administered 2024-09-25: 10 [IU] via SUBCUTANEOUS
  Filled 2024-09-25 (×4): qty 0.1

## 2024-09-25 MED ORDER — HALOPERIDOL LACTATE 5 MG/ML IJ SOLN
5.0000 mg | Freq: Once | INTRAMUSCULAR | Status: AC
  Administered 2024-09-25: 5 mg via INTRAVENOUS
  Filled 2024-09-25: qty 1

## 2024-09-25 MED ORDER — ASPIRIN 81 MG PO TBEC
81.0000 mg | DELAYED_RELEASE_TABLET | Freq: Every day | ORAL | Status: DC
  Administered 2024-09-25 – 2024-09-26 (×2): 81 mg via ORAL
  Filled 2024-09-25 (×2): qty 1

## 2024-09-25 MED ORDER — GABAPENTIN 100 MG PO CAPS
200.0000 mg | ORAL_CAPSULE | Freq: Two times a day (BID) | ORAL | Status: DC
  Administered 2024-09-25 – 2024-09-26 (×4): 200 mg via ORAL
  Filled 2024-09-25 (×4): qty 2

## 2024-09-25 MED ORDER — SODIUM CHLORIDE 0.9 % IV SOLN
2.0000 g | Freq: Three times a day (TID) | INTRAVENOUS | Status: DC
  Administered 2024-09-25 – 2024-09-26 (×4): 2 g via INTRAVENOUS
  Filled 2024-09-25 (×4): qty 12.5

## 2024-09-25 MED ORDER — POLYETHYLENE GLYCOL 3350 17 G PO PACK: 17.0000 g | PACK | Freq: Every day | ORAL | Status: DC | PRN

## 2024-09-25 MED ORDER — ATORVASTATIN CALCIUM 10 MG PO TABS
10.0000 mg | ORAL_TABLET | Freq: Every evening | ORAL | Status: DC
  Administered 2024-09-25: 10 mg via ORAL
  Filled 2024-09-25: qty 1

## 2024-09-25 MED ORDER — DIVALPROEX SODIUM 250 MG PO DR TAB
1250.0000 mg | DELAYED_RELEASE_TABLET | Freq: Every evening | ORAL | Status: DC
  Administered 2024-09-25: 1250 mg via ORAL
  Filled 2024-09-25: qty 5

## 2024-09-25 MED ORDER — OXYCODONE HCL 5 MG PO TABS: 5.0000 mg | ORAL_TABLET | ORAL | Status: DC | PRN

## 2024-09-25 MED ORDER — ENOXAPARIN SODIUM 40 MG/0.4ML IJ SOSY
40.0000 mg | PREFILLED_SYRINGE | INTRAMUSCULAR | Status: DC
  Administered 2024-09-25 – 2024-09-26 (×2): 40 mg via SUBCUTANEOUS
  Filled 2024-09-25 (×2): qty 0.4

## 2024-09-25 MED ORDER — LORAZEPAM 0.5 MG PO TABS
0.5000 mg | ORAL_TABLET | Freq: Once | ORAL | Status: AC
  Administered 2024-09-25: 0.5 mg via ORAL
  Filled 2024-09-25: qty 1

## 2024-09-25 NOTE — TOC Initial Note (Addendum)
 Transition of Care [facility]) - Initial/Assessment Note    Patient Details  Name: Regina Watson MRN: [id number] Date of Birth: [birth year]  [facility] CM/SW Contact:    Hoy DELENA Bigness, LCSW Phone Number: 09/25/2024, 10:28 AM  Clinical Narrative:                 CSW spoke with pt's legal guardian, Jenkins Larve to complete assessment. Pt currently resides at [facility]. Pt is able to ambulate with RW at baseline and completes ADL's independently. Confirmed plan for pt to return to her [facility] at discharge. Per Ms Larve they are currently seeking alternate placement for pt.  CSW spoke with Ty, at [facility] [phone]) and confirmed they are able to accept pt back once medically stable.   ADDENDUM: Pt recommended for HH. HH referrals sent out however, currently no HHA able to accept pt due to insurance being out of network.   Expected Discharge Plan: Group Home Barriers to Discharge: Continued Medical Work up   Patient Goals and CMS Choice Patient states their goals for this hospitalization and ongoing recovery are:: For pt to return to [facility] CMS Medicare.gov Compare Post Acute Care list provided to:: Legal Guardian Choice offered to / list presented to : [facility] POA / Guardian      Expected Discharge Plan and Services In-house Referral: Clinical Social Work Discharge Planning Services: NA Post Acute Care Choice: Resumption of Svcs/PTA Provider, NA Living arrangements for the past 2 months: Group Home [facility])                                      Prior Living Arrangements/Services Living arrangements for the past 2 months: Group Home [facility]) Lives with:: Facility Resident Patient language and need for interpreter reviewed:: Yes Do you feel safe going back to the place where you live?: Yes      Need for Family Participation in Patient Care: Yes (Comment) Care giver support system in place?: Yes (comment) Current home services: DME  (RW) Criminal Activity/Legal Involvement Pertinent to Current Situation/Hospitalization: No - Comment as needed  Activities of Daily Living      Permission Sought/Granted Permission sought to share information with : Facility Medical Sales Representative, Guardian    Share Information with NAME: Jenkins Larve  Permission granted to share info w AGENCY: [facility]  Permission granted to share info w Relationship: Legal Guardian  Permission granted to share info w Contact Information: [phone]  Emotional Assessment   Attitude/Demeanor/Rapport: Unable to Assess Affect (typically observed): Unable to Assess Orientation: : Oriented to Self, Oriented to Place Alcohol  / Substance Use: Not Applicable Psych Involvement: Outpatient Provider  Admission diagnosis:  Diabetic foot infection (HCC) [Z88.371, L08.9] Patient Active Problem List   Diagnosis Date Noted   Diabetic foot infection (HCC) 09/25/2024   Psychiatric disorder 01/02/2023   Acute pyelonephritis 01/02/2023   Peripheral edema 01/02/2023   Hypokalemia 01/02/2023   Sepsis (HCC) 01/01/2023   Callus 07/05/2022   Abscess 05/08/2022   Breast abscess    Dysphagia 03/05/2022   Nausea and vomiting 03/03/2022   Early satiety 03/03/2022   Constipation 03/03/2022   Loss of weight 03/03/2022   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   Acute metabolic encephalopathy 01/17/2021   Uterine mass 01/17/2021   Altered mental status 10/15/2020   Leukocytosis 10/15/2020   AKI (acute kidney injury) 10/15/2020  Hyperglycemia due to diabetes mellitus (HCC) 10/15/2020   Hyperlipidemia 10/15/2020   Diabetic neuropathy (HCC) 10/15/2020   UTI (urinary tract infection) 10/15/2019   Encephalopathy acute 10/14/2019   Schizophrenia, chronic condition with acute exacerbation (HCC) 10/14/2019   Type 2 diabetes mellitus without complication 10/14/2019   Acute encephalopathy 10/14/2019   Palpitations  10/04/2016   Essential hypertension 10/04/2016   Tobacco use 10/04/2016   Tachycardia 10/04/2016   PCP:  Jerome Heron Ruth, PA-C Pharmacy:   [facility], [place] - [address] [place] [address] Phone: [phone] Fax: [phone]     Social Drivers of Health (SDOH) Social History: SDOH Screenings   Food Insecurity: No Food Insecurity (09/07/2022)   Received from [facility]  Transportation Needs: No Transportation Needs (09/07/2022)   Received from [facility]  Utilities: Not At Risk (09/07/2022)   Received from [facility]  Financial Resource Strain: Low Risk (09/07/2022)   Received from [facility]  Tobacco Use: High Risk (09/24/2024)   SDOH Interventions:     Readmission Risk Interventions    09/25/2024   10:23 AM  Readmission Risk Prevention Plan  Transportation Screening Complete  PCP or Specialist Appt within 3-5 Days Complete  HRI or Home Care Consult Complete  Social Work Consult for Recovery Care Planning/Counseling Complete  Palliative Care Screening Not Applicable  Medication Review Ocean[employer]) Complete

## 2024-09-25 NOTE — Plan of Care (Signed)
" °  Problem: Education: Goal: Ability to describe self-care measures that may prevent or decrease complications (Diabetes Survival Skills Education) will improve Outcome: Progressing Goal: Individualized Educational Video(s) Outcome: Progressing   Problem: Coping: Goal: Ability to adjust to condition or change in health will improve Outcome: Progressing   Problem: Health Behavior/Discharge Planning: Goal: Ability to identify and utilize available resources and services will improve Outcome: Progressing Goal: Ability to manage health-related needs will improve Outcome: Progressing   Problem: Skin Integrity: Goal: Risk for impaired skin integrity will decrease Outcome: Progressing   Problem: Health Behavior/Discharge Planning: Goal: Ability to manage health-related needs will improve Outcome: Progressing   Problem: Clinical Measurements: Goal: Ability to maintain clinical measurements within normal limits will improve Outcome: Progressing Goal: Will remain free from infection Outcome: Progressing Goal: Diagnostic test results will improve Outcome: Progressing Goal: Respiratory complications will improve Outcome: Progressing Goal: Cardiovascular complication will be avoided Outcome: Progressing   Problem: Pain Managment: Goal: General experience of comfort will improve and/or be controlled Outcome: Progressing   Problem: Safety: Goal: Ability to remain free from injury will improve Outcome: Progressing   Problem: Skin Integrity: Goal: Risk for impaired skin integrity will decrease Outcome: Progressing   "

## 2024-09-25 NOTE — H&P (Addendum)
 " History and Physical  Regina Watson FMW:[id number] DOB: 02-Feb-1966 DOA: 09/24/2024  Referring physician: Dr. Roselyn, EDP  PCP: Jerome Heron Ruth, PA-C  Outpatient Specialists: Podiatry, medical oncology, GI, Patient coming from: Assisted living facility.  Chief Complaint: Left foot pain.  HPI: Regina Watson is a 59 y.o. female with medical history significant for uncontrolled type 2 diabetes, obesity, stage Ia left invasive ductal carcinoma (post left lumpectomy, sentinel lymph node surgery, post adjuvant radiation therapy), on tamoxifen , schizophrenia, chronic lower extremity edema, blindness, current tobacco user, who presents to the ER due to left foot pain.  Reports a blister in the dorsal base of her left great toe that burst.  It started draining pus earlier today.  Has significant pain with pressure.  Denies subjective fevers.  Admits to chills.  Has seen a podiatrist who cuts and trims her toenails.    In the ER, left foot x-ray showed no radiographic evidence of osteomyelitis.  The patient received broad-spectrum IV antibiotics due to concern for diabetic foot infection.  The patient is afebrile with no leukocytosis.  Sed rate 19.  CRP is pending.  Admitted by [facility], hospitalist service.  ED Course: Temperature 97.8.  BP 151/89, pulse 86, respiration rate 17, O2 saturation 97% on room air.  Review of Systems: Review of systems as noted in the HPI. All other systems reviewed and are negative.   Past Medical History:  Diagnosis Date   Anemia    Blind    Breast cancer (HCC)    Diabetes mellitus without complication (HCC)    Hypertension    MDD (major depressive disorder)    Neuropathy 2010   Schizophrenia [facility])    Past Surgical History:  Procedure Laterality Date   BIOPSY  04/12/2022   Procedure: BIOPSY;  Surgeon: Eartha Angelia Sieving, MD;  Location: [facility];  Service: Gastroenterology;;   COLONOSCOPY WITH PROPOFOL  N/A 04/12/2022   Procedure: COLONOSCOPY  WITH PROPOFOL ;  Surgeon: Eartha Angelia Sieving, MD;  Location: [facility];  Service: Gastroenterology;  Laterality: N/A;  1230 ASA 3 pt at [facility]   ESOPHAGOGASTRODUODENOSCOPY (EGD) WITH PROPOFOL  N/A 04/12/2022   Procedure: ESOPHAGOGASTRODUODENOSCOPY (EGD) WITH PROPOFOL ;  Surgeon: Eartha Angelia Sieving, MD;  Location: [facility];  Service: Gastroenterology;  Laterality: N/A;   INCISION AND DRAINAGE ABSCESS Left 05/08/2022   Procedure: INCISION AND DRAINAGE ABSCESS of left breast abscess;  Surgeon: Evonnie Dorothyann LABOR, DO;  Location: [facility] ORS;  Service: General;  Laterality: Left;   TOE AMPUTATION      Social History:  reports that she has been smoking cigarettes. She has a 20 pack-year smoking history. She has never used smokeless tobacco. She reports that she does not currently use alcohol . She reports that she does not currently use drugs.   Allergies[1]  Family History  Problem Relation Age of Onset   Hypertension Mother    Cancer Mother    Diabetes Father       Prior to Admission medications  Medication Sig Start Date End Date Taking? Authorizing Provider  acetaminophen  (TYLENOL ) 325 MG tablet Take 650 mg by mouth every 6 (six) hours as needed for mild pain or moderate pain.    [provider]  acetaZOLAMIDE  ER (DIAMOX ) 500 MG capsule Take 1 capsule (500 mg total) by mouth 2 (two) times daily. 01/04/23   Pearlean Manus, MD  albuterol  (VENTOLIN  HFA) 108 (90 Base) MCG/ACT inhaler Inhale 2 puffs into the lungs every 4 (four) hours as needed for wheezing or  shortness of breath.    [provider]  amLODipine  (NORVASC ) 5 MG tablet Take 1 tablet (5 mg total) by mouth daily. 01/04/23   Pearlean Manus, MD  ASPIRIN  EC ADULT LOW DOSE 81 MG tablet Take 81 mg by mouth daily. 08/08/24   [provider]  atorvastatin  (LIPITOR) 10 MG tablet Take 1 tablet (10 mg total) by mouth every evening. 01/04/23   Pearlean Manus, MD   benztropine  (COGENTIN ) 0.5 MG tablet Take 0.5 mg by mouth 2 (two) times daily.    [provider]  ciprofloxacin  (CIPRO ) 500 MG tablet Take 1 tablet (500 mg total) by mouth 2 (two) times daily. Patient not taking: Reported on 08/19/2024 05/25/24   Dean Clarity, MD  cloNIDine  (CATAPRES ) 0.1 MG tablet Take 0.1 mg by mouth 2 (two) times daily. 08/08/24   [provider]  colchicine  0.6 MG tablet Take 1 tablet (0.6 mg total) by mouth daily. 01/04/23   Pearlean Manus, MD  cyclobenzaprine (FLEXERIL) 5 MG tablet Take 5 mg by mouth 3 (three) times daily as needed. 07/23/24   [provider]  divalproex  (DEPAKOTE ) 250 MG DR tablet Take 5 tablets (1,250 mg total) by mouth every evening. 12/13/23   Mardy Legacy, NP  divalproex  (DEPAKOTE ) 500 MG DR tablet Take 500 mg by mouth 2 (two) times daily. Patient not taking: Reported on 08/19/2024 08/08/24   [provider]  fluconazole (DIFLUCAN) 100 MG tablet Take 100 mg by mouth daily. Patient not taking: Reported on 08/19/2024 12/01/23   [provider]  furosemide  (LASIX ) 20 MG tablet Take 1 tablet (20 mg total) by mouth 2 (two) times daily. 12/30/23 08/19/24  Kommor, Madison, MD  gabapentin  (NEURONTIN ) 400 MG capsule Take 1 capsule (400 mg total) by mouth 3 (three) times daily. 01/04/23   Pearlean Manus, MD  Glycerin-Hypromellose-PEG 400 (VISINE DRY EYE OP) Place 1 drop into both eyes daily.    [provider]  hydrOXYzine  (ATARAX ) 25 MG tablet Take 25 mg by mouth 3 (three) times daily.    [provider]  ibuprofen  (ADVIL ) 800 MG tablet Take 800 mg by mouth 3 (three) times daily. 08/08/24   [provider]  insulin  glargine (LANTUS ) 100 UNIT/ML injection Inject 0.25 mLs (25 Units total) into the skin at bedtime. 01/04/23   Pearlean Manus, MD  levocetirizine (XYZAL ) 5 MG tablet Take 5 mg by mouth daily.    [provider]  losartan  (COZAAR ) 25 MG tablet Take 25 mg by mouth  daily. 08/08/24   [provider]  metFORMIN  (GLUCOPHAGE ) 1000 MG tablet Take 1 tablet (1,000 mg total) by mouth 2 (two) times daily with a meal. Patient not taking: Reported on 08/19/2024 01/04/23   Pearlean Manus, MD  NOVOLOG  FLEXPEN 100 UNIT/ML FlexPen Inject 11-15 Units into the skin 3 (three) times daily with meals. Sliding scale: If Glucose is 150-200 give 11 units; 201-250 give 12 units; 251-300 give 13 units; 301-350 give 14 units 351-400 give 15 units 10/27/23   [provider]  omeprazole  (PRILOSEC) 40 MG capsule TAKE ONE CAPSULE BY MOUTH ONCE DAILY. NEEDS OFFICE VISIT 05/05/23   Eartha Flavors, Toribio, MD  polyethylene glycol (MIRALAX  / GLYCOLAX ) 17 g packet Take 17 g by mouth daily.    [provider]  potassium chloride  (KLOR-CON ) 10 MEQ tablet Take 1 tablet (10 mEq total) by mouth daily. Take While taking Diamox /acetazolamide  01/04/23   Pearlean Manus, MD  QUEtiapine  (SEROQUEL  XR) 400 MG 24 hr tablet Take 2 tablets (800  mg total) by mouth at bedtime. 01/04/23   Pearlean Manus, MD  risperiDONE  (RISPERDAL  M-TABS) 3 MG disintegrating tablet Take 1 tablet (3 mg total) by mouth every morning. Patient not taking: Reported on 08/19/2024 01/04/23   Pearlean Manus, MD  risperiDONE  (RISPERDAL ) 1 MG tablet Take 0.5 tablets (0.5 mg total) by mouth 2 (two) times daily as needed (agitation). Patient not taking: Reported on 08/19/2024 12/13/23 12/12/24  Mardy Legacy, NP  risperidone  (RISPERDAL ) 4 MG tablet Take 4 mg by mouth at bedtime. Patient not taking: Reported on 08/19/2024 08/08/24   [provider]  Semaglutide ,0.25 or 0.5MG /DOS, (OZEMPIC , 0.25 OR 0.5 MG/DOSE,) 2 MG/1.5ML SOPN Inject 0.25 mg into the skin every Friday. Patient not taking: Reported on 08/19/2024 01/06/23   Pearlean Manus, MD  sennosides-docusate sodium  (SENOKOT-S) 8.6-50 MG tablet Take 2 tablets by mouth at bedtime. 01/04/23   Pearlean Manus, MD  SPIRIVA  HANDIHALER 18 MCG CAPS  Irrigate with 1 capsule as directed daily. 08/09/24   [provider]  SYSTANE ULTRA PF 0.4-0.3 % SOLN Place 2 drops into both eyes in the morning, at noon, and at bedtime. Patient not taking: Reported on 08/19/2024 12/21/22   [provider]  tamoxifen  (NOLVADEX ) 20 MG tablet Take 1 tablet (20 mg total) by mouth daily. Patient not taking: Reported on 08/19/2024 01/04/23   Pearlean Manus, MD  tiotropium (SPIRIVA ) 18 MCG inhalation capsule Place 1 capsule (18 mcg total) into inhaler and inhale daily. 01/04/23   Pearlean Manus, MD  traMADol  (ULTRAM ) 50 MG tablet Take 1 tablet (50 mg total) by mouth every 8 (eight) hours as needed for severe pain or moderate pain. Patient not taking: Reported on 08/19/2024 01/04/23   Pearlean Manus, MD  triamcinolone  cream (KENALOG ) 0.1 % Apply 1 Application topically 2 (two) times daily. 04/15/24   Leath-Warren, Etta PARAS, NP  Vitamin D , Ergocalciferol , (DRISDOL ) 1.25 MG (50000 UNIT) CAPS capsule Take 50,000 Units by mouth every Saturday.    [provider]    Physical Exam: BP (!) 151/89 (BP Location: Left Arm)   Pulse 86   Temp 97.8 F (36.6 C) (Oral)   Resp 17   SpO2 97%   General: 59 y.o. year-old female well developed well nourished in no acute distress.  Alert and oriented x3. Cardiovascular: Regular rate and rhythm with no rubs or gallops.  No thyromegaly or JVD noted.  2+ pitting edema in lower extremities bilaterally. Respiratory: Clear to auscultation with no wheezes or rales. Good inspiratory effort. Abdomen: Soft nontender nondistended with normal bowel sounds x4 quadrants. Muskuloskeletal: No cyanosis or clubbing noted bilaterally Neuro: CN II-XII intact, strength, sensation, reflexes Skin:  Psychiatry: Judgement and insight appear normal. Mood is appropriate for condition and setting          Labs on Admission:  Basic Metabolic Panel: Recent Labs  Lab 09/25/24 0000  NA 140  K 4.1  CL 107  CO2 20*   GLUCOSE 372*  BUN 17  CREATININE 0.83  CALCIUM  8.9   Liver Function Tests: Recent Labs  Lab 09/25/24 0000  AST 15  ALT 11  ALKPHOS 129*  BILITOT 0.3  PROT 8.3*  ALBUMIN 3.8   No results for input(s): LIPASE, AMYLASE in the last 168 hours. No results for input(s): AMMONIA in the last 168 hours. CBC: Recent Labs  Lab 09/25/24 0000  WBC 5.7  NEUTROABS 3.6  HGB 11.8*  HCT 38.4  MCV 98.5  PLT 196   Cardiac Enzymes: No results for input(s): CKTOTAL, CKMB,  CKMBINDEX, TROPONINI in the last 168 hours.  BNP (last 3 results) Recent Labs    10/15/23 1315 12/30/23 2207  BNP 20.0 12.0    ProBNP (last 3 results) No results for input(s): PROBNP in the last 8760 hours.  CBG: No results for input(s): GLUCAP in the last 168 hours.  Radiological Exams on Admission: DG Foot Complete Left Result Date: 09/24/2024 EXAM: 3 OR MORE VIEW(S) XRAY OF THE LEFT FOOT 09/24/2024 11:25:00 PM COMPARISON: None available. CLINICAL HISTORY: Foot wound. 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 Severe degenerative changes about the 1st MTP joint are unchanged from 05/24/2011. No radiographic evidence of osteomyelitis. Tibiotalar joint deformity and severe arthritis . No acute fracture. No malalignment. SOFT TISSUES: Subcutaneous soft tissue edema. IMPRESSION: 1. No radiographic evidence of osteomyelitis. Electronically signed by: Norman Gatlin MD 09/24/2024 11:42 PM EST RP Workstation: [id number]    EKG: I independently viewed the EKG done and my findings are as followed: None available at the time of the visit.  Assessment/Plan Present on Admission:  Diabetic foot infection (HCC)  Principal Problem:   Diabetic foot infection (HCC)  Left diabetic foot infection, POA No evidence of osteomyelitis on x-ray of the left foot. Sed rate 19, CRP is pending. Continue cefepime , IV Flagyl , and IV vancomycin  Control blood  sugar Consider podiatry consult during this admission  Type 2 diabetes with hyperglycemia Presented with serum glucose of 372. Last hemoglobin A1c 6.2 on 01/02/2023 Follow updated hemoglobin A1c Long-acting and short acting insulin  coverage.  Non anion gap metabolic acidosis in the setting of lactic acidosis Serum bicarb 20, anion gap 13 Lactic acid 3.1 Repeat BMP in the morning  Lactic acidosis Lactic acid 3.1 Gentle IV fluid hydration LR at 100 cc/h x 6 hours. Trend lactic acid.  Elevated alkaline phosphatase Alkaline phosphatase 129 Nonspecific Continue to monitor  Diabetic polyneuropathy Resume home gabapentin   Chronic bilateral lower extremity edema Elevate lower extremities  Ambulatory dysfunction Uses a walker at baseline PT OT evaluation Fall precaution  Obesity BMI 30 Recommend weight loss outpatient with regular physical activity and healthy diet.  Schizophrenia Major depressive disorder Resume home regimen.  Blindness Orient as needed Fall precautions.    Time: 75 minutes.    DVT prophylaxis: Subcutaneous Lovenox  daily.  Code Status: Full code.  Family Communication: None at bedside.  Disposition Plan: Admitted to telemetry unit.  Consults called: None.  Admission status: Inpatient status.   Status is: Inpatient The patient requires at least 2 midnights for further evaluation and treatment of present condition.   Terry LOISE Hurst MD [facility] Pager [phone]  If 7PM-7AM, please contact night-coverage [email or web address] Password [email or web address]  09/25/2024, 1:57 AM      [1]  Allergies Allergen Reactions   Ziprasidone  Hcl Other (See Comments)    Psychotic reaction   Penicillinase Rash and Swelling   Sulfa Antibiotics Other (See Comments) and Hives   Sulfamethoxazole-Trimethoprim Hives and Other (See Comments)    Dermatitis Bullous lesions   Penicillins Other (See Comments)    Unknown     Latex Itching   "

## 2024-09-25 NOTE — Evaluation (Signed)
 Physical Therapy Evaluation Patient Details Name: Regina Watson MRN: [id number] DOB: [birth year] Today's Date: 09/25/2024  History of Present Illness  Regina Watson is a 59 y.o. female with medical history significant for uncontrolled type 2 diabetes, obesity, stage Ia left invasive ductal carcinoma (post left lumpectomy, sentinel lymph node surgery, post adjuvant radiation therapy), on tamoxifen , schizophrenia, chronic lower extremity edema, blindness, current tobacco user, who presents to the ER due to left foot pain.  Reports a blister in the dorsal base of her left great toe that burst.  It started draining pus earlier today.  Has significant pain with pressure.  Denies subjective fevers.  Admits to chills.  Has seen a podiatrist who cuts and trims her toenails. (per DO)   Clinical Impression  Patient functioning near baseline for functional mobility and gait other than having to use a RW due to left foot pain and requiring frequent tactile cueing due to limited eye sight, otherwise patient demonstrates good return for getting in/out of bed and ambulating in room/hallway without loss of balance, but limited mostly due to c/o fatigue. Patient requested to go back to bed after therapy. Patient will benefit from continued skilled physical therapy in hospital and recommended venue below to increase strength, balance, endurance for safe ADLs and gait.        If plan is discharge home, recommend the following: A little help with walking and/or transfers;Assistance with cooking/housework;A lot of help with bathing/dressing/bathroom;Assist for transportation;Help with stairs or ramp for entrance   Can travel by private vehicle        Equipment Recommendations None recommended by PT  Recommendations for Other Services       Functional Status Assessment Patient has had a recent decline in their functional status and demonstrates the ability to make significant improvements in function in a  reasonable and predictable amount of time.     Precautions / Restrictions Precautions Precautions: Fall Recall of Precautions/Restrictions: Impaired Restrictions Weight Bearing Restrictions Per Provider Order: No      Mobility  Bed Mobility Overal bed mobility: Needs Assistance Bed Mobility: Supine to Sit, Sit to Supine     Supine to sit: Modified independent (Device/Increase time) Sit to supine: Modified independent (Device/Increase time)   General bed mobility comments: slightly labored movement    Transfers Overall transfer level: Needs assistance   Transfers: Sit to/from Stand Sit to Stand: Contact guard assist, Min assist           General transfer comment: required use of RW for safety    Ambulation/Gait Ambulation/Gait assistance: Supervision, Contact guard assist Gait Distance (Feet): 65 Feet Assistive device: Rolling walker (2 wheels) Gait Pattern/deviations: Decreased step length - left, Decreased stance time - right, Decreased stride length, Decreased dorsiflexion - right, Decreased dorsiflexion - left, Steppage Gait velocity: decreased     General Gait Details: slow slightly labored movement, requiring tactile assistance mostly due to poor eye sight, very short steps with limited bilateral ankle dorsiflexion  Stairs            Wheelchair Mobility     Tilt Bed    Modified Rankin (Stroke Patients Only)       Balance Overall balance assessment: Needs assistance Sitting-balance support: Feet unsupported, No upper extremity supported Sitting balance-Leahy Scale: Good Sitting balance - Comments: seated at EOB   Standing balance support: During functional activity, No upper extremity supported Standing balance-Leahy Scale: Fair Standing balance comment: fair without AD, fair/good using RW  Pertinent Vitals/Pain Pain Assessment Pain Assessment: Faces Faces Pain Scale: Hurts a little bit Pain  Location: BLUE, left foot Pain Descriptors / Indicators: Discomfort Pain Intervention(s): Limited activity within patient's tolerance, Monitored during session, Repositioned    Home Living Family/patient expects to be discharged to:: Assisted living                 Home Equipment: [employer] (2 wheels)      Prior Function Prior Level of Function : Needs assist       Physical Assist : ADLs (physical)   ADLs (physical): IADLs Mobility Comments: Pt reports ambulation with RW PRN. ADLs Comments: Pt reports independence with ADL's.     Extremity/Trunk Assessment   Upper Extremity Assessment Upper Extremity Assessment: Defer to OT evaluation    Lower Extremity Assessment Lower Extremity Assessment: Generalized weakness    Cervical / Trunk Assessment Cervical / Trunk Assessment: Normal  Communication   Communication Communication: No apparent difficulties    Cognition Arousal: Alert Behavior During Therapy: WFL for tasks assessed/performed                             Following commands: Intact       Cueing Cueing Techniques: Verbal cues, Tactile cues     General Comments      Exercises     Assessment/Plan    PT Assessment Patient needs continued PT services  PT Problem List Decreased strength;Decreased activity tolerance;Decreased balance;Decreased mobility;Pain       PT Treatment Interventions DME instruction;Gait training;Stair training;Functional mobility training;Therapeutic activities;Therapeutic exercise;Balance training;Patient/family education    PT Goals (Current goals can be found in the Care Plan section)  Acute Rehab PT Goals Patient Stated Goal: retun home with ALF staff to assist PT Goal Formulation: With patient Time For Goal Achievement: 09/28/24    Frequency Min 3X/week     Co-evaluation PT/OT/SLP Co-Evaluation/Treatment: Yes Reason for Co-Treatment: To address functional/ADL transfers PT goals addressed  during session: Mobility/safety with mobility;Balance;Proper use of DME OT goals addressed during session: ADL's and self-care       AM-PAC PT 6 Clicks Mobility  Outcome Measure Help needed turning from your back to your side while in a flat bed without using bedrails?: None Help needed moving from lying on your back to sitting on the side of a flat bed without using bedrails?: None Help needed moving to and from a bed to a chair (including a wheelchair)?: A Little Help needed standing up from a chair using your arms (e.g., wheelchair or bedside chair)?: A Little Help needed to walk in hospital room?: A Little Help needed climbing 3-5 steps with a railing? : A Lot 6 Click Score: 19    End of Session   Activity Tolerance: Patient tolerated treatment well;Patient limited by fatigue Patient left: in bed;with call bell/phone within reach Nurse Communication: Mobility status PT Visit Diagnosis: Unsteadiness on feet (R26.81);Other abnormalities of gait and mobility (R26.89);Muscle weakness (generalized) (M62.81)    Time: 8884-8871 PT Time Calculation (min) (ACUTE ONLY): 13 min   Charges:   PT Evaluation $PT Eval Low Complexity: 1 Low PT Treatments $Gait Training: 8-22 mins PT General Charges $$ ACUTE PT VISIT: 1 Visit         2:58 PM, 09/25/24 Lynwood Music, MPT Physical Therapist with [employer] [facility] [phone] office [phone] mobile phone

## 2024-09-25 NOTE — Plan of Care (Signed)
" °  Problem: Acute Rehab OT Goals (only OT should resolve) Goal: Pt. Will Perform Grooming Flowsheets (Taken 09/25/2024 1338) Pt Will Perform Grooming:  with modified independence  standing  with set-up Goal: Pt. Will Perform Lower Body Dressing Flowsheets (Taken 09/25/2024 1338) Pt Will Perform Lower Body Dressing:  with modified independence  sitting/lateral leans Goal: Pt. Will Transfer To Toilet Flowsheets (Taken 09/25/2024 1338) Pt Will Transfer to Toilet:  with modified independence  ambulating Goal: Pt/Caregiver Will Perform Home Exercise Program Flowsheets (Taken 09/25/2024 1338) Pt/caregiver will Perform Home Exercise Program:  Increased strength  Both right and left upper extremity  Independently  Gerlene Glassburn OT, MOT  "

## 2024-09-25 NOTE — Progress Notes (Addendum)
 Patient admitted after midnight, please see H&P.  Here with left foot cellulitis and uncontrolled DM.  Will get ABIs and MRI.  Patient got haldol  overnight and is currently sleeping.  Her care is complicated by schizophrenia.  Harlene Bowl DO  Patient follows with podiatry in [place]-- made follow up appointment for 2/11 at 10:15 AM

## 2024-09-25 NOTE — ED Notes (Signed)
 Pt back from ct and resting at this time.

## 2024-09-25 NOTE — ED Notes (Signed)
 1st attempt to call report

## 2024-09-25 NOTE — Discharge Instructions (Signed)
 Regina Watson

## 2024-09-25 NOTE — ED Notes (Signed)
 Pt just made this RN aware that she does not want to stay the night in the hospital and wants to leave. Pt was educated by this RN regarding why she needs to stay in the hospital. Pt refused to sign the medicare paperwork for admission with registration. MD Shona made aware.

## 2024-09-25 NOTE — Evaluation (Signed)
 Occupational Therapy Evaluation Patient Details Name: Regina Watson MRN: [id number] DOB: 1965-10-05 Today's Date: 09/25/2024   History of Present Illness   Regina Watson is a 59 y.o. female with medical history significant for uncontrolled type 2 diabetes, obesity, stage Ia left invasive ductal carcinoma (post left lumpectomy, sentinel lymph node surgery, post adjuvant radiation therapy), on tamoxifen , schizophrenia, chronic lower extremity edema, blindness, current tobacco user, who presents to the ER due to left foot pain.  Reports a blister in the dorsal base of her left great toe that burst.  It started draining pus earlier today.  Has significant pain with pressure.  Denies subjective fevers.  Admits to chills.  Has seen a podiatrist who cuts and trims her toenails. (per DO)     Clinical Impressions Pt agreeable to OT and PT co-evaluation, but very much wanting to return to ALF as soon as possible. Pt demonstrated need for supervision assist to complete bed mobility. CGA to min A needed for functional ambulation with RW. Pt unsteady in standing without AD. Pt generally weak and able to slide on shoes with min A today. Pt demonstrates need for set up to min A for most ADL's. Pt left in the bed with nursing staff present. Pt will benefit from continued OT in the hospital to increase strength, balance, and endurance for safe ADL's.        If plan is discharge home, recommend the following:   A little help with walking and/or transfers;A little help with bathing/dressing/bathroom;Assistance with cooking/housework;Assist for transportation;Help with stairs or ramp for entrance     Functional Status Assessment   Patient has had a recent decline in their functional status and demonstrates the ability to make significant improvements in function in a reasonable and predictable amount of time.     Equipment Recommendations   None recommended by OT               Precautions/Restrictions   Precautions Precautions: Fall Recall of Precautions/Restrictions: Impaired Restrictions Weight Bearing Restrictions Per Provider Order: No     Mobility Bed Mobility Overal bed mobility: Needs Assistance Bed Mobility: Supine to Sit, Sit to Supine     Supine to sit: Modified independent (Device/Increase time) Sit to supine: Modified independent (Device/Increase time)   General bed mobility comments: Mild labored effort.    Transfers Overall transfer level: Needs assistance   Transfers: Sit to/from Stand Sit to Stand: Contact guard assist, Min assist           General transfer comment: Sit to stand from EOB followed by ambualtion with RW as listed above.      Balance Overall balance assessment: Needs assistance Sitting-balance support: No upper extremity supported, Feet supported Sitting balance-Leahy Scale: Good Sitting balance - Comments: seated at EOB   Standing balance support: No upper extremity supported, During functional activity Standing balance-Leahy Scale: Poor Standing balance comment: poor to fair without RW; fair with RW                           ADL either performed or assessed with clinical judgement   ADL Overall ADL's : Needs assistance/impaired     Grooming: Set up;Sitting       Lower Body Bathing: Set up;Minimal assistance;Sitting/lateral leans       Lower Body Dressing: Set up;Minimal assistance;Sitting/lateral leans   Toilet Transfer: Minimal assistance;Rolling walker (2 wheels);Ambulation Toilet Transfer Details (indicate cue type and reason): Via ambulation in the hall with  RW. Toileting- Clothing Manipulation and Hygiene: Set up;Sitting/lateral lean       Functional mobility during ADLs: Minimal assistance;Rolling walker (2 wheels) General ADL Comments: Assisted needed to steer RW while pt ambualted.     Vision Baseline Vision/History: 2 Legally blind Ability to See in Adequate  Light: 3 Highly impaired Patient Visual Report: No change from baseline Vision Assessment?:  (Baseline deficits)     Perception Perception: Not tested       Praxis Praxis: Not tested       Pertinent Vitals/Pain Pain Assessment Pain Assessment: Faces Faces Pain Scale: Hurts a little bit Pain Location: B LE pain Pain Descriptors / Indicators: Discomfort Pain Intervention(s): Limited activity within patient's tolerance, Monitored during session, Repositioned     Extremity/Trunk Assessment Upper Extremity Assessment Upper Extremity Assessment: Generalized weakness;Overall [facility] for tasks assessed   Lower Extremity Assessment Lower Extremity Assessment: Defer to PT evaluation   Cervical / Trunk Assessment Cervical / Trunk Assessment: Normal   Communication Communication Communication: No apparent difficulties   Cognition Arousal: Alert Behavior During Therapy: WFL for tasks assessed/performed Cognition: No family/caregiver present to determine baseline             OT - Cognition Comments: Pt fixated on wanting to go back to ALF.                 Following commands: Intact       Cueing  General Comments   Cueing Techniques: Verbal cues;Tactile cues                 Home Living Family/patient expects to be discharged to:: Assisted living                             Home Equipment: Rolling Walker (2 wheels)          Prior Functioning/Environment Prior Level of Function : Needs assist       Physical Assist : ADLs (physical)   ADLs (physical): IADLs Mobility Comments: Pt reports ambulation with RW PRN. ADLs Comments: Pt reports independence with ADL's.    OT Problem List: Decreased strength;Decreased activity tolerance;Impaired balance (sitting and/or standing)   OT Treatment/Interventions: Self-care/ADL training;Therapeutic exercise;Therapeutic activities;Patient/family education;Balance training;DME and/or AE instruction       OT Goals(Current goals can be found in the care plan section)   Acute Rehab OT Goals Patient Stated Goal: Return to ALF OT Goal Formulation: With patient Time For Goal Achievement: 10/09/24 Potential to Achieve Goals: Good   OT Frequency:  Min 1X/week    Co-evaluation PT/OT/SLP Co-Evaluation/Treatment: Yes Reason for Co-Treatment: To address functional/ADL transfers   OT goals addressed during session: ADL's and self-care                       End of Session Equipment Utilized During Treatment: Rolling walker (2 wheels) Nurse Communication: Mobility status  Activity Tolerance: Patient tolerated treatment well Patient left: in bed;with call bell/phone within reach;with nursing/sitter in room  OT Visit Diagnosis: Unsteadiness on feet (R26.81);Other abnormalities of gait and mobility (R26.89);Muscle weakness (generalized) (M62.81)                Time: 8882-8870 OT Time Calculation (min): 12 min Charges:  OT General Charges $OT Visit: 1 Visit OT Evaluation $OT Eval Low Complexity: 1 Low  Carron Jaggi OT, MOT   Jayson Person 09/25/2024, 1:35 PM

## 2024-09-25 NOTE — ED Notes (Signed)
 This RN walked into the pts room found the pt was at the edge of the bed calling out for the police because we are holding her against her will. This RN educated the pt that we are trying to take care of her and that for her safety to please sit back in the bed. The pt was educated on how the IV and monitoring equipment that she is attached to her could make her fall. Pt was also educated that with the pts wound on her found also makes her unstable to walk and could cause her to fall. Pt continued to endorse that she did not care and that she wanted the police to come and talk to her. This RN made MD Shona and MD Roselyn aware of the situation.

## 2024-09-25 NOTE — ED Notes (Signed)
 Pt very cooperative and taken all meds, pt is at ct at this time.

## 2024-09-25 NOTE — Plan of Care (Signed)
" °  Problem: Acute Rehab PT Goals(only PT should resolve) Goal: Pt Will Go Supine/Side To Sit Outcome: Progressing Flowsheets (Taken 09/25/2024 1500) Pt will go Supine/Side to Sit:  with modified independence  Independently Goal: Patient Will Transfer Sit To/From Stand Outcome: Progressing Flowsheets (Taken 09/25/2024 1500) Patient will transfer sit to/from stand:  with modified independence  Independently Goal: Pt Will Transfer Bed To Chair/Chair To Bed Outcome: Progressing Flowsheets (Taken 09/25/2024 1500) Pt will Transfer Bed to Chair/Chair to Bed:  with modified independence  Independently Goal: Pt Will Ambulate Outcome: Progressing Flowsheets (Taken 09/25/2024 1500) Pt will Ambulate:  100 feet  with modified independence  with supervision  with rolling walker  with least restrictive assistive device   3:00 PM, 09/25/24 Lynwood Music, MPT Physical Therapist with [facility] [phone] office [phone] mobile phone  "

## 2024-09-25 NOTE — Consult Note (Signed)
 Reason for Consult: Cellulitis of left foot Referring Physician: Dr. Juvenal Lucie Piety is an 59 y.o. female.  HPI: Patient is a 59 year old black female with a past medical history of uncontrolled diabetes mellitus, obesity, schizophrenia, blindness, left breast carcinoma, and chronic lower extremity edema who presented to the emergency room due to left foot pain.  She stated she had a blister at the base of her left great toe that ruptured and started draining pus.  She does follow a podiatrist at [facility].  Initial left foot x-ray showed no evidence of osteomyelitis.  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  She is being admitted by the hospitalist for control of her diabetes and IV antibiotics.  Past Medical History:  Diagnosis Date   Anemia    Blind    Breast cancer (HCC)    Diabetes mellitus without complication (HCC)    Hypertension    MDD (major depressive disorder)    Neuropathy 2010   Schizophrenia (HCC)     Past Surgical History:  Procedure Laterality Date   BIOPSY  04/12/2022   Procedure: BIOPSY;  Surgeon: Eartha Angelia Sieving, MD;  [facility];  Service: Gastroenterology;;   COLONOSCOPY WITH PROPOFOL  N/A 04/12/2022   Procedure: COLONOSCOPY WITH PROPOFOL ;  Surgeon: Eartha Angelia Sieving, MD;  [facility];  Service: Gastroenterology;  Laterality: N/A;  1230 ASA [facility]   ESOPHAGOGASTRODUODENOSCOPY (EGD) WITH PROPOFOL  N/A 04/12/2022   Procedure: ESOPHAGOGASTRODUODENOSCOPY (EGD) WITH PROPOFOL ;  Surgeon: Eartha Angelia Sieving, MD;  [facility];  Service: Gastroenterology;  Laterality: N/A;   INCISION AND DRAINAGE ABSCESS Left 05/08/2022   Procedure: INCISION AND DRAINAGE ABSCESS of left breast abscess;  Surgeon: Evonnie Dorothyann LABOR, DO;  Location: [facility];  Service: General;  Laterality: Left;   TOE AMPUTATION      Family History  Problem Relation Age of Onset    Hypertension Mother    Cancer Mother    Diabetes Father     Social History:  reports that she has been smoking cigarettes. She has a 20 pack-year smoking history. She has never used smokeless tobacco. She reports that she does not currently use alcohol . She reports that she does not currently use drugs.  Allergies: Allergies[1]  Medications: I have reviewed the patient's current medications. Prior to Admission: (Not in a hospital admission)   Results for orders placed or performed during the hospital encounter of 09/24/24 (from the past 48 hours)  Comprehensive metabolic panel     Status: Abnormal   Collection Time: 09/25/24 12:00 AM  Result Value Ref Range   Sodium 140 135 - 145 mmol/L   Potassium 4.1 3.5 - 5.1 mmol/L   Chloride 107 98 - 111 mmol/L   CO2 20 (L) 22 - 32 mmol/L   Glucose, Bld 372 (H) 70 - 99 mg/dL    Comment: Glucose reference range applies only to samples taken after fasting for at least 8 hours.   BUN 17 6 - 20 mg/dL   Creatinine, Ser 9.16 0.44 - 1.00 mg/dL   Calcium  8.9 8.9 - 10.3 mg/dL   Total Protein 8.3 (H) 6.5 - 8.1 g/dL   Albumin 3.8 3.5 - 5.0 g/dL   AST 15 15 - 41 U/L   ALT 11 0 - 44 U/L   Alkaline Phosphatase 129 (H) 38 - 126 U/L   Total Bilirubin 0.3 0.0 - 1.2 mg/dL   GFR, Estimated >39 >39  mL/min    Comment: (NOTE) Calculated using the CKD-EPI Creatinine Equation (2021)    Anion gap 13 5 - 15    Comment: Performed at [facility], [address] [place], [place] [address]  CBC with Differential     Status: Abnormal   Collection Time: 09/25/24 12:00 AM  Result Value Ref Range   WBC 5.7 4.0 - 10.5 K/uL   RBC 3.90 3.87 - 5.11 MIL/uL   Hemoglobin 11.8 (L) 12.0 - 15.0 g/dL   HCT 61.5 63.9 - 53.9 %   MCV 98.5 80.0 - 100.0 fL   MCH 30.3 26.0 - 34.0 pg   MCHC 30.7 30.0 - 36.0 g/dL   RDW 86.8 88.4 - 84.4 %   Platelets 196 150 - 400 K/uL   nRBC 0.0 0.0 - 0.2 %   Neutrophils Relative % 63 %   Neutro Abs 3.6 1.7 - 7.7 K/uL   Lymphocytes Relative 24  %   Lymphs Abs 1.3 0.7 - 4.0 K/uL   Monocytes Relative 7 %   Monocytes Absolute 0.4 0.1 - 1.0 K/uL   Eosinophils Relative 5 %   Eosinophils Absolute 0.3 0.0 - 0.5 K/uL   Basophils Relative 0 %   Basophils Absolute 0.0 0.0 - 0.1 K/uL   Immature Granulocytes 1 %   Abs Immature Granulocytes 0.04 0.00 - 0.07 K/uL    Comment: Performed at [facility], [address], [place], [place] [address]  Lactic acid     Status: Abnormal   Collection Time: 09/25/24 12:00 AM  Result Value Ref Range   Lactic Acid, Venous 3.1 (HH) 0.5 - 1.9 mmol/L    Comment: Critical Value, Read Back and verified with C. Turner [phone], Collin PARAS Performed at [facility], [address] [place], [place] [address]   Blood Cultures x 2 sites     Status: None (Preliminary result)   Collection Time: 09/25/24 12:00 AM   Specimen: BLOOD  Result Value Ref Range   Specimen Description BLOOD BLOOD RIGHT HAND    Special Requests      BOTTLES DRAWN AEROBIC ONLY Blood Culture results may not be optimal due to an inadequate volume of blood received in culture bottles   Culture      NO GROWTH < 12 HOURS Performed at [facility], [address], [place], [place] [address]    Report Status PENDING   Sedimentation rate     Status: None   Collection Time: 09/25/24 12:00 AM  Result Value Ref Range   Sed Rate 19 0 - 30 mm/hr    Comment: Performed at [facility], [address] [place], [place] [address]  Hemoglobin A1c     Status: Abnormal   Collection Time: 09/25/24 12:00 AM  Result Value Ref Range   Hgb A1c MFr Bld 10.0 (H) 4.8 - 5.6 %    Comment: (NOTE) Diagnosis of Diabetes The following HbA1c ranges recommended by the American Diabetes Association (ADA) may be used as an aid in the diagnosis of diabetes mellitus.  Hemoglobin             Suggested A1C NGSP%              Diagnosis  <5.7                   Non Diabetic  5.7-6.4                Pre-Diabetic  >6.4  Diabetic  <7.0                    Glycemic control for                       adults with diabetes.     Mean Plasma Glucose 240.3 mg/dL    Comment: Performed at [facility], [address], [place], [place] [address]  Blood Cultures x 2 sites     Status: None (Preliminary result)   Collection Time: 09/25/24  2:12 AM   Specimen: BLOOD  Result Value Ref Range   Specimen Description BLOOD BLOOD LEFT ARM    Special Requests      BOTTLES DRAWN AEROBIC AND ANAEROBIC Blood Culture results may not be optimal due to an inadequate volume of blood received in culture bottles   Culture      NO GROWTH < 12 HOURS Performed at [facility], [address], [place], [place] [address]    Report Status PENDING   CBG monitoring, ED     Status: Abnormal   Collection Time: 09/25/24  2:16 AM  Result Value Ref Range   Glucose-Capillary 316 (H) 70 - 99 mg/dL    Comment: Glucose reference range applies only to samples taken after fasting for at least 8 hours.  CBG monitoring, ED     Status: Abnormal   Collection Time: 09/25/24  7:48 AM  Result Value Ref Range   Glucose-Capillary 329 (H) 70 - 99 mg/dL    Comment: Glucose reference range applies only to samples taken after fasting for at least 8 hours.    DG Foot Complete Left Result Date: 09/24/2024 EXAM: 3 OR MORE VIEW(S) XRAY OF THE LEFT FOOT 09/24/2024 11:25:00 PM COMPARISON: None available. CLINICAL HISTORY: Foot wound. 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 Severe degenerative changes about the 1st MTP joint are unchanged from 05/24/2011. No radiographic evidence of osteomyelitis. Tibiotalar joint deformity and severe arthritis . No acute fracture. No malalignment. SOFT TISSUES: Subcutaneous soft tissue edema. IMPRESSION: 1. No radiographic evidence of osteomyelitis. Electronically signed by: Norman Gatlin MD 09/24/2024 11:42 PM EST RP Workstation: [id number]    ROS:  Review of systems not obtained due to patient  factors.  Blood pressure 127/71, pulse 84, temperature 98.5 F (36.9 C), temperature source Oral, resp. rate 14, height 5' 7.5 (1.715 m), weight 90.3 kg, SpO2 99%. Physical Exam: Patient is somnolent and did not want to answer my questions. Extremity examination: Multiple areas of chronic stasis dermatitis noted in both lower extremities with soft tissue edema noted in the left foot.  A callus is present over the plantar surface of the first metatarsal head which is not draining at the present time.  No fluctuance could be identified.  No appreciable erythema.  Does have chronic skin changes bilaterally.  I could not easily palpate a dorsalis pedis or posterior tibial pulse.   Assessment/Plan: Impression: Left foot cellulitis, uncontrolled diabetes mellitus. Plan: Agree with MRI of left foot.  Have also ordered arterial segmental Doppler study of both legs.  No need for acute surgical invention at the present time.  Will follow with you.  Oneil Budge 09/25/2024, 9:42 AM         [1]  Allergies Allergen Reactions   Ziprasidone  Hcl Other (See Comments)    Psychotic reaction   Penicillinase Rash and Swelling   Sulfa Antibiotics Other (See Comments) and Hives  Sulfamethoxazole-Trimethoprim Hives and Other (See Comments)    Dermatitis Bullous lesions   Penicillins Other (See Comments)    Unknown     Latex Itching

## 2024-09-25 NOTE — Inpatient Diabetes Management (Signed)
 Inpatient Diabetes Program Recommendations  AACE/ADA: New Consensus Statement on Inpatient Glycemic Control (2015)  Target Ranges:  Prepandial:   less than 140 mg/dL      Peak postprandial:   less than 180 mg/dL (1-2 hours)      Critically ill patients:  140 - 180 mg/dL   Lab Results  Component Value Date   GLUCAP 329 (H) 09/25/2024   HGBA1C 10.0 (H) 09/25/2024   Diabetes history: DM2 Outpatient Diabetes medications:  Lantus  25 units daily November 11-15 units tid Metformin  1 gm bid Current orders for Inpatient glycemic control:  Lantus  20 units daily Novolog  5 units tid Novolog  0-20 units tid, 0-5 units hs  Inpatient Diabetes Program Recommendations:   Noted A1c 10.0. Ordered Living Well With Diabetes booklet for patient reviewed. Will plan to speak with patient via phone ( DM coordinator working remotely from [employer]).  Thank you, Daymein Nunnery E. Eian Vandervelden, RN, MSN, CNS, CDCES  Diabetes Coordinator Inpatient Glycemic Control Team Team Pager [phone] (8am-5pm) 09/25/2024 9:07 AM

## 2024-09-25 NOTE — Progress Notes (Signed)
 Pharmacy Antibiotic Note  Regina Watson is a 59 y.o. female admitted on 09/24/2024 with diabetic foot wound infection.  Pharmacy has been consulted for vancomycin  and cefepime  dosing.  Plan: Vancomycin  2000mg  x1 then 1000mg  IV Q12H. Goal AUC 400-550.  Expected AUC 460. Cefepime  2g IV Q8H.  Height: 5' 7.5 (171.5 cm) Weight: 90.3 kg (199 lb) IBW/kg (Calculated) : 62.75  Temp (24hrs), Avg:98.4 F (36.9 C), Min:97.8 F (36.6 C), Max:99 F (37.2 C)  Recent Labs  Lab 09/25/24 0000  WBC 5.7  CREATININE 0.83  LATICACIDVEN 3.1*    Estimated Creatinine Clearance: 88.2 mL/min (by C-G formula based on SCr of 0.83 mg/dL).    Allergies[1]  Thank you for allowing pharmacy to be a part of this patients care.  Marvetta Dauphin, PharmD, BCPS  09/25/2024 2:10 AM     [1]  Allergies Allergen Reactions   Ziprasidone  Hcl Other (See Comments)    Psychotic reaction   Penicillinase Rash and Swelling   Sulfa Antibiotics Other (See Comments) and Hives   Sulfamethoxazole-Trimethoprim Hives and Other (See Comments)    Dermatitis Bullous lesions   Penicillins Other (See Comments)    Unknown     Latex Itching

## 2024-09-26 LAB — GLUCOSE, CAPILLARY
Glucose-Capillary: 161 mg/dL — ABNORMAL HIGH (ref 70–99)
Glucose-Capillary: 286 mg/dL — ABNORMAL HIGH (ref 70–99)

## 2024-09-26 LAB — BASIC METABOLIC PANEL WITH GFR
Anion gap: 9 (ref 5–15)
BUN: 15 mg/dL (ref 6–20)
CO2: 23 mmol/L (ref 22–32)
Calcium: 8.6 mg/dL — ABNORMAL LOW (ref 8.9–10.3)
Chloride: 112 mmol/L — ABNORMAL HIGH (ref 98–111)
Creatinine, Ser: 0.75 mg/dL (ref 0.44–1.00)
GFR, Estimated: >60 mL/min
Glucose, Bld: 187 mg/dL — ABNORMAL HIGH (ref 70–99)
Potassium: 3.4 mmol/L — ABNORMAL LOW (ref 3.5–5.1)
Sodium: 144 mmol/L (ref 135–145)

## 2024-09-26 LAB — CBC
HCT: 31.4 % — ABNORMAL LOW (ref 36.0–46.0)
Hemoglobin: 10.1 g/dL — ABNORMAL LOW (ref 12.0–15.0)
MCH: 30.3 pg (ref 26.0–34.0)
MCHC: 32.2 g/dL (ref 30.0–36.0)
MCV: 94.3 fL (ref 80.0–100.0)
Platelets: 210 10*3/uL (ref 150–400)
RBC: 3.33 MIL/uL — ABNORMAL LOW (ref 3.87–5.11)
RDW: 12.8 % (ref 11.5–15.5)
WBC: 9 10*3/uL (ref 4.0–10.5)
nRBC: 0 % (ref 0.0–0.2)

## 2024-09-26 LAB — LACTIC ACID, PLASMA: Lactic Acid, Venous: 0.8 mmol/L (ref 0.5–1.9)

## 2024-09-26 LAB — C-REACTIVE PROTEIN: CRP: 1.7 mg/dL — ABNORMAL HIGH

## 2024-09-26 MED ORDER — DOXYCYCLINE HYCLATE 100 MG PO TABS
100.0000 mg | ORAL_TABLET | Freq: Two times a day (BID) | ORAL | Status: DC
  Administered 2024-09-26: 100 mg via ORAL
  Filled 2024-09-26: qty 1

## 2024-09-26 MED ORDER — QUETIAPINE FUMARATE ER 50 MG PO TB24: 300.0000 mg | ORAL_TABLET | Freq: Every day | ORAL | Status: DC

## 2024-09-26 MED ORDER — CEFADROXIL 500 MG PO CAPS: 500.0000 mg | ORAL_CAPSULE | Freq: Two times a day (BID) | ORAL | 0 refills | Status: AC

## 2024-09-26 MED ORDER — POTASSIUM CHLORIDE CRYS ER 20 MEQ PO TBCR
40.0000 meq | EXTENDED_RELEASE_TABLET | Freq: Once | ORAL | Status: AC
  Administered 2024-09-26: 40 meq via ORAL
  Filled 2024-09-26: qty 2

## 2024-09-26 MED ORDER — CEPHALEXIN 500 MG PO CAPS: 500.0000 mg | ORAL_CAPSULE | Freq: Two times a day (BID) | ORAL | Status: DC

## 2024-09-26 MED ORDER — TRAMADOL HCL 50 MG PO TABS: 50.0000 mg | ORAL_TABLET | Freq: Three times a day (TID) | ORAL | 0 refills | Status: AC | PRN

## 2024-09-26 MED ORDER — CEPHALEXIN 500 MG PO CAPS: 500.0000 mg | ORAL_CAPSULE | Freq: Two times a day (BID) | ORAL | 0 refills | Status: DC

## 2024-09-26 MED ORDER — DOXYCYCLINE HYCLATE 100 MG PO TABS: 100.0000 mg | ORAL_TABLET | Freq: Two times a day (BID) | ORAL | 0 refills | Status: AC

## 2024-09-26 MED ORDER — ACETAZOLAMIDE ER 500 MG PO CP12
500.0000 mg | ORAL_CAPSULE | Freq: Two times a day (BID) | ORAL | Status: DC
  Administered 2024-09-26: 500 mg via ORAL
  Filled 2024-09-26 (×4): qty 1

## 2024-09-26 NOTE — Progress Notes (Signed)
 Physical Therapy Treatment Patient Details Name: Regina Watson MRN: [id number] DOB: [birth year] Today's Date: 09/26/2024   History of Present Illness Regina Watson is a 59 y.o. female with medical history significant for uncontrolled type 2 diabetes, obesity, stage Ia left invasive ductal carcinoma (post left lumpectomy, sentinel lymph node surgery, post adjuvant radiation therapy), on tamoxifen , schizophrenia, chronic lower extremity edema, blindness, current tobacco user, who presents to the ER due to left foot pain.  Reports a blister in the dorsal base of her left great toe that burst.  It started draining pus earlier today.  Has significant pain with pressure.  Denies subjective fevers.  Admits to chills.  Has seen a podiatrist who cuts and trims her toenails.    PT Comments  Patient continues demonstrates decreased LE strength, abnormal pain rating of L foot, and impaired functional mobility. Patient also demonstrates difficulty with ambulation during today's session, decreased stride length and velocity noted. Patients symptoms likely arising from generalized weakness and wound on L foot. Patient also demonstrates modified independence for bed mobility, functional transfers and ambulation with RW and assist with IV pole. Patient requires education on PT recommendations, POC, and importance of consistent mobility. Patient would continue to benefit from skilled acute physical therapy for increased independence with ambulation, increased LE strength, and balance for improved functional mobility, return to higher level of function with ADLs, and progress towards therapy goals.     If plan is discharge home, recommend the following: A little help with walking and/or transfers;Assistance with cooking/housework;A lot of help with bathing/dressing/bathroom;Assist for transportation;Help with stairs or ramp for entrance   Can travel by private vehicle        Equipment Recommendations  None  recommended by PT    Recommendations for Other Services       Precautions / Restrictions Precautions Precautions: Fall Recall of Precautions/Restrictions: Impaired Restrictions Weight Bearing Restrictions Per Provider Order: No     Mobility  Bed Mobility Overal bed mobility: Needs Assistance Bed Mobility: Supine to Sit, Sit to Supine     Supine to sit: Modified independent (Device/Increase time) Sit to supine: Modified independent (Device/Increase time)   General bed mobility comments: slightly labored movement    Transfers Overall transfer level: Needs assistance   Transfers: Sit to/from Stand Sit to Stand: Contact guard assist, Min assist           General transfer comment: required use of RW for safety, requires assist to navigate around obstacles    Ambulation/Gait Ambulation/Gait assistance: Supervision, Contact guard assist Gait Distance (Feet): 70 Feet Assistive device: Rolling walker (2 wheels) Gait Pattern/deviations: Decreased step length - left, Decreased stance time - right, Decreased stride length, Decreased dorsiflexion - right, Decreased dorsiflexion - left, Steppage Gait velocity: decreased     General Gait Details: slow slightly labored movement, requiring tactile assistance mostly due to poor eye sight, very short steps with limited bilateral ankle dorsiflexion   Stairs             Wheelchair Mobility     Tilt Bed    Modified Rankin (Stroke Patients Only)       Balance Overall balance assessment: Needs assistance Sitting-balance support: Feet unsupported, No upper extremity supported Sitting balance-Leahy Scale: Good Sitting balance - Comments: seated at EOB   Standing balance support: During functional activity, No upper extremity supported Standing balance-Leahy Scale: Fair Standing balance comment: fair without AD, fair/good using RW  Communication Communication Communication: No  apparent difficulties  Cognition Arousal: Alert Behavior During Therapy: WFL for tasks assessed/performed   PT - Cognitive impairments: No apparent impairments                         Following commands: Intact      Cueing Cueing Techniques: Verbal cues, Tactile cues  Exercises      General Comments        Pertinent Vitals/Pain Pain Assessment Pain Assessment: Faces Faces Pain Scale: Hurts little more Pain Location: BLUE, left foot Pain Descriptors / Indicators: Discomfort Pain Intervention(s): Limited activity within patient's tolerance, Monitored during session, Repositioned    Home Living                          Prior Function            PT Goals (current goals can now be found in the care plan section) Acute Rehab PT Goals Patient Stated Goal: retun home with ALF staff to assist PT Goal Formulation: With patient Time For Goal Achievement: 09/28/24 Progress towards PT goals: Progressing toward goals    Frequency    Min 3X/week      PT Plan      Co-evaluation              AM-PAC PT 6 Clicks Mobility   Outcome Measure  Help needed turning from your back to your side while in a flat bed without using bedrails?: None Help needed moving from lying on your back to sitting on the side of a flat bed without using bedrails?: None Help needed moving to and from a bed to a chair (including a wheelchair)?: A Little Help needed standing up from a chair using your arms (e.g., wheelchair or bedside chair)?: A Little Help needed to walk in hospital room?: A Little Help needed climbing 3-5 steps with a railing? : A Lot 6 Click Score: 19    End of Session Equipment Utilized During Treatment: Gait belt Activity Tolerance: Patient tolerated treatment well;Patient limited by fatigue Patient left: in bed;with call bell/phone within reach   PT Visit Diagnosis: Unsteadiness on feet (R26.81);Other abnormalities of gait and mobility  (R26.89);Muscle weakness (generalized) (M62.81)     Time: 9078-9055 PT Time Calculation (min) (ACUTE ONLY): 23 min  Charges:    $Gait Training: 8-22 mins $Therapeutic Activity: 8-22 mins PT General Charges $$ ACUTE PT VISIT: 1 Visit                     Lang Ada, PT, DPT [facility] Office: [phone] 11:06 AM, 09/26/24

## 2024-09-26 NOTE — Progress Notes (Signed)
 "   Subjective: Patient has no complaints.  Objective: Vital signs in last 24 hours: Temp:  [97.6 F (36.4 C)-100.7 F (38.2 C)] 98.6 F (37 C) (02/05 0408) Pulse Rate:  [81-108] 92 (02/05 0830) Resp:  [16-18] 17 (02/05 0830) BP: (104-158)/(49-79) 122/76 (02/05 0830) SpO2:  [92 %-100 %] 92 % (02/05 0830) Weight:  [90.6 kg] 90.6 kg (02/04 1545) Last BM Date : 09/24/24  Intake/Output from previous day: 02/04 0701 - 02/05 0700 In: 1006.4 [I.V.:263; IV Piggyback:743.4] Out: -  Intake/Output this shift: Total I/O In: 960 [P.O.:960] Out: -   General appearance: alert, cooperative, and no distress Extremities: Left foot with slightly less edema noted.  No purulent drainage present.  No significant change from yesterday.  Lab Results:  Recent Labs    09/25/24 0000 09/26/24 0434  WBC 5.7 9.0  HGB 11.8* 10.1*  HCT 38.4 31.4*  PLT 196 210   BMET Recent Labs    09/25/24 0000 09/26/24 0434  NA 140 144  K 4.1 3.4*  CL 107 112*  CO2 20* 23  GLUCOSE 372* 187*  BUN 17 15  CREATININE 0.83 0.75  CALCIUM  8.9 8.6*   PT/INR No results for input(s): LABPROT, INR in the last 72 hours.  Studies/Results: MR FOOT LEFT W WO CONTRAST Result Date: 09/25/2024 EXAM: MRI of the left Foot without and with contrast. 09/25/2024 10:49:32 AM TECHNIQUE: Multiplanar multisequence MRI of the left foot was performed without and with the administration of 9 mL of gadobutrol  (GADAVIST ) 1 MMOL/ML intravenous contrast. COMPARISON: Comparison radiographs 09/24/2024 and ankle MRI 05/25/2011. CLINICAL HISTORY: Left foot pain, abscess. FINDINGS: LISFRANC JOINT: Visualized Lisfranc ligament is intact. No significant Lisfranc interval widening or significant periligamentous edema. BONE MARROW: Deformity of the distal tibia related to remote fracture partially included on today's exam. Secondary arthropathy with chondral thinning in the tibiotalar joint. Mild dorsal midfoot degenerative findings. 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 Metal artifact along the distal margin of the remaining 2nd metatarsal. Potentially some mild flattening of the 1st metatarsal head. Spurring between the 1st metatarsal head and adjacent sesamoids. No current marrow edema or enhancement to indicate active osteomyelitis. No acute fracture or aggressive marrow replacing lesion. 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 Metal artifact along the distal margin of the remaining 2nd metatarsal. No significant joint effusion or osseous erosions. Normal alignment. SOFT TISSUES: Prominent regional muscular atrophy. Spontaneous irregularity plantar to the 1st metatarsophalangeal joint and 1st digit sesamoids potentially from blistering and/or ulceration. No gas tracking in the soft tissues. No drainable abscess observed. Dorsal subcutaneous edema tracking into the remaining toes, cellulitis not excluded. Possible minimal blistering dorsally along the forefoot as on image 39 series 4. TENDONS: Visualized flexor and extensor tendons are intact without tenosynovitis. IMPRESSION: 1. No drainable abscess and no MRI evidence of active osteomyelitis. 2. Dorsal subcutaneous edema tracking into the remaining toes, cellulitis not excluded, with possible minimal dorsal forefoot blistering. 3. Plantar soft tissue irregularity at the level of the 1st metatarsophalangeal joint and 1st digit sesamoids, possibly from blistering and/or ulceration, without soft tissue gas. 4. Remote distal tibial fracture deformity with secondary tibiotalar arthropathy and chondral thinning. 5. Prominent regional muscular atrophy. 6. 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 7. Substantial osteoarthritis of the 1st  metatarsophalangeal joint with spurring and possible  mild flattening of the 1st metatarsal head, with spurring between the 1st metatarsal head and adjacent sesamoids. 8. Mild dorsal midfoot degenerative changes. Electronically signed by: Ryan Salvage MD 09/25/2024 12:06 PM EST RP Workstation: [id number]   US  ARTERIAL ABI (SCREENING LOWER EXTREMITY) Result Date: 09/25/2024 CLINICAL DATA:  Cellulitis in the setting of diabetes. LEFT heel blister with purulent drainage for 2 weeks. Prior LEFT second toe amputation. EXAM: NONINVASIVE PHYSIOLOGIC VASCULAR STUDY OF BILATERAL LOWER EXTREMITIES TECHNIQUE: Evaluation of both lower extremities were performed at rest, including calculation of ankle-brachial indices with single level pressure measurements and doppler recording. COMPARISON:  None available. FINDINGS: Right ABI:  1.33 Left ABI:  1.32 Right Lower Extremity: Monophasic waveform seen in the posterior tibial artery, however assessment is limited due to suboptimal waveform. Multiphasic waveform seen in the dorsalis pedis artery. Left Lower Extremity: Monophasic waveform seen in the posterior tibial artery however, assessment is limited due to suboptimal waveform. Multiphasic waveform seen in the dorsalis pedis artery. 1.0-1.4 Normal IMPRESSION: No definitive evidence of significant lower extremity peripheral arterial disease. Electronically Signed   By: Aliene Lloyd M.D.   On: 09/25/2024 11:59   DG Foot Complete Left Result Date: 09/24/2024 EXAM: 3 OR MORE VIEW(S) XRAY OF THE LEFT FOOT 09/24/2024 11:25:00 PM COMPARISON: None available. CLINICAL HISTORY: Foot wound. 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 Severe degenerative changes about the 1st MTP joint are unchanged from 05/24/2011. No radiographic evidence of osteomyelitis. Tibiotalar joint deformity and severe arthritis . No acute fracture. No malalignment. SOFT TISSUES: Subcutaneous soft tissue edema.  IMPRESSION: 1. No radiographic evidence of osteomyelitis. Electronically signed by: Norman Gatlin MD 09/24/2024 11:42 PM EST RP Workstation: [id number]    Anti-infectives: Anti-infectives (From admission, onward)    Start     Dose/Rate Route Frequency Ordered Stop   09/26/24 1400  cephALEXin  (KEFLEX ) capsule 500 mg        500 mg Oral Every 12 hours 09/26/24 1015     09/26/24 1115  doxycycline  (VIBRA -TABS) tablet 100 mg        100 mg Oral Every 12 hours 09/26/24 1015     09/25/24 1200  vancomycin  (VANCOCIN ) IVPB 1000 mg/200 mL premix  Status:  Discontinued        1,000 mg 200 mL/hr over 60 Minutes Intravenous Every 12 hours 09/25/24 0209 09/26/24 1015   09/25/24 1000  metroNIDAZOLE  (FLAGYL ) IVPB 500 mg  Status:  Discontinued        500 mg 100 mL/hr over 60 Minutes Intravenous Every 12 hours 09/25/24 0156 09/26/24 1015   09/25/24 1000  ceFEPIme  (MAXIPIME ) 2 g in sodium chloride  0.9 % 100 mL IVPB  Status:  Discontinued        2 g 200 mL/hr over 30 Minutes Intravenous Every 8 hours 09/25/24 0209 09/26/24 1015   09/25/24 0215  vancomycin  (VANCOREADY) IVPB 2000 mg/400 mL        2,000 mg 200 mL/hr over 120 Minutes Intravenous  Once 09/25/24 0200 09/25/24 0638   09/25/24 0215  ceFEPIme  (MAXIPIME ) 2 g in sodium chloride  0.9 % 100 mL IVPB        2 g 200 mL/hr over 30 Minutes Intravenous  Once 09/25/24 0209 09/25/24 0425   09/25/24 0100  cefTRIAXone  (ROCEPHIN ) 2 g in sodium chloride  0.9 % 100 mL IVPB  Status:  Discontinued       Placed in And Linked Group   2 g 200 mL/hr over 30 Minutes Intravenous Once  09/25/24 0052 09/25/24 0241   09/25/24 0100  metroNIDAZOLE  (FLAGYL ) IVPB 500 mg       Placed in And Linked Group   500 mg 100 mL/hr over 60 Minutes Intravenous  Once 09/25/24 0052 09/25/24 0349   09/25/24 0100  vancomycin  (VANCOCIN ) IVPB 1000 mg/200 mL premix  Status:  Discontinued       Placed in And Linked Group   1,000 mg 200 mL/hr over 60 Minutes Intravenous  Once 09/25/24 0052  09/25/24 0159       Assessment/Plan: Impression: Cellulitis of diabetic left foot, resolving.  No need for surgical intervention at the present time.  MRI reassuring.  ABIs within normal limits. Plan: Patient may be discharged from surgery standpoint.  I would make arrangements for follow-up with her podiatrist in [place].  Discussed with Dr. Juvenal who states she has a roommate that follow-up appointment.  LOS: 1 day    Oneil Budge 09/26/2024  "

## 2024-09-26 NOTE — Plan of Care (Signed)

## 2024-09-26 NOTE — NC FL2 (Signed)
 " [place]  MEDICAID [id number] LEVEL OF CARE FORM     IDENTIFICATION  Patient Name: Regina Watson Birthdate: October 29, 1965 Sex: female Admission Date (Current Location): 09/24/2024  [facility] and [place] Number:  [employer] and Address:  [facility],  [address], [place] [address]      Provider Number: [id number]  Attending Physician Name and Address:  Juvenal Harlene PENNER, DO  Relative Name and Phone Number:  Jenkins Larve [facility]) [phone]    Current Level of Care: Hospital Recommended Level of Care: Assisted Living Facility Prior Approval Number:    Date Approved/Denied:   PASRR Number:    Discharge Plan: Other (Comment) (Assisted Living)    Current Diagnoses: Patient Active Problem List   Diagnosis Date Noted   Diabetic foot infection (HCC) 09/25/2024   Psychiatric disorder 01/02/2023   Acute pyelonephritis 01/02/2023   Peripheral edema 01/02/2023   Hypokalemia 01/02/2023   Sepsis (HCC) 01/01/2023   Callus 07/05/2022   Abscess 05/08/2022   Breast abscess    Dysphagia 03/05/2022   Nausea and vomiting 03/03/2022   Early satiety 03/03/2022   Constipation 03/03/2022   Loss of weight 03/03/2022   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   Acute metabolic encephalopathy 01/17/2021   Uterine mass 01/17/2021   Altered mental status 10/15/2020   Leukocytosis 10/15/2020   AKI (acute kidney injury) 10/15/2020   Hyperglycemia due to diabetes mellitus (HCC) 10/15/2020   Hyperlipidemia 10/15/2020   Diabetic neuropathy (HCC) 10/15/2020   UTI (urinary tract infection) 10/15/2019   Encephalopathy acute 10/14/2019   Schizophrenia, chronic condition with acute exacerbation (HCC) 10/14/2019   Type 2 diabetes mellitus without complication 10/14/2019   Acute encephalopathy 10/14/2019   Palpitations 10/04/2016   Essential hypertension 10/04/2016   Tobacco use 10/04/2016   Tachycardia 10/04/2016    Orientation  RESPIRATION BLADDER Height & Weight     Self, Place, Situation  Normal Continent Weight: 199 lb 11.8 oz (90.6 kg) Height:  5' 8 (172.7 cm)  BEHAVIORAL SYMPTOMS/MOOD NEUROLOGICAL BOWEL NUTRITION STATUS      Continent Diet (See DC summary)  AMBULATORY STATUS COMMUNICATION OF NEEDS Skin   Supervision Verbally Other (Comment) (Diabetic Ulcer; Anterior, Left, Medial)                       Personal Care Assistance Level of Assistance  Bathing, Feeding, Dressing Bathing Assistance: Limited assistance Feeding assistance: Independent Dressing Assistance: Limited assistance     Functional Limitations Info  Sight, Hearing, Speech Sight Info: Impaired (Blind) Hearing Info: Impaired Speech Info: Adequate    SPECIAL CARE FACTORS FREQUENCY                       Contractures Contractures Info: Not present    Additional Factors Info  Code Status, Allergies Code Status Info: FULL Allergies Info: Ziprasidone  Hcl, Penicillinase, Sulfa Antibiotics, Sulfamethoxazole-trimethoprim, Penicillins, Latex           Current Medications (09/26/2024):  This is the current hospital active medication list Current Facility-Administered Medications  Medication Dose Route Frequency Provider Last Rate Last Admin   acetaminophen  (TYLENOL ) tablet 650 mg  650 mg Oral Q6H PRN Shona Terry SAILOR, DO       acetaZOLAMIDE  ER (DIAMOX ) 12 hr capsule 500 mg  500 mg Oral BID Vann, Jessica U, DO   500 mg at 09/26/24 1214   aspirin  EC tablet 81 mg  81 mg Oral Daily Hall,  Terry SAILOR, DO   81 mg at 09/26/24 0847   atorvastatin  (LIPITOR) tablet 10 mg  10 mg Oral QPM Shona Terry N, DO   10 mg at 09/25/24 1719   benztropine  (COGENTIN ) tablet 0.5 mg  0.5 mg Oral BID Vann, Jessica U, DO   0.5 mg at 09/26/24 9153   cephALEXin  (KEFLEX ) capsule 500 mg  500 mg Oral Q12H Vann, Jessica U, DO       divalproex  (DEPAKOTE ) DR tablet 1,250 mg  1,250 mg Oral QPM Shona Terry N, DO   1,250 mg at 09/25/24 1719   doxycycline   (VIBRA -TABS) tablet 100 mg  100 mg Oral Q12H Vann, Jessica U, DO   100 mg at 09/26/24 1103   enoxaparin  (LOVENOX ) injection 40 mg  40 mg Subcutaneous Q24H Shona Terry N, DO   40 mg at 09/26/24 9141   gabapentin  (NEURONTIN ) capsule 200 mg  200 mg Oral BID Shona Terry SAILOR, DO   200 mg at 09/26/24 9152   hydrOXYzine  (ATARAX ) tablet 25 mg  25 mg Oral TID Vann, Jessica U, DO   25 mg at 09/26/24 9153   insulin  aspart (novoLOG ) injection 0-20 Units  0-20 Units Subcutaneous TID WC Shona Terry N, DO   11 Units at 09/26/24 1213   insulin  aspart (novoLOG ) injection 0-5 Units  0-5 Units Subcutaneous QHS Shona Terry SAILOR, DO   4 Units at 09/25/24 9761   insulin  aspart (novoLOG ) injection 5 Units  5 Units Subcutaneous TID WC Shona Terry N, DO   5 Units at 09/26/24 1214   insulin  glargine-yfgn injection 20 Units  20 Units Subcutaneous BID Vann, Jessica U, DO   20 Units at 09/26/24 1103   living well with diabetes book MISC   Does not apply Once Vann, Jessica U, DO       melatonin tablet 6 mg  6 mg Oral QHS PRN Shona Terry N, DO   6 mg at 09/25/24 2154   oxyCODONE  (Oxy IR/ROXICODONE ) immediate release tablet 5-10 mg  5-10 mg Oral Q4H PRN Shona Terry SAILOR, DO       pantoprazole  (PROTONIX ) EC tablet 40 mg  40 mg Oral Daily Shona Terry SAILOR, DO   40 mg at 09/26/24 9152   polyethylene glycol (MIRALAX  / GLYCOLAX ) packet 17 g  17 g Oral Daily PRN Shona Terry SAILOR, DO       prochlorperazine  (COMPAZINE ) injection 5 mg  5 mg Intravenous Q6H PRN Shona Terry N, DO       QUEtiapine  (SEROQUEL  XR) 24 hr tablet 300 mg  300 mg Oral QHS Vann, Jessica U, DO       tamoxifen  (NOLVADEX ) tablet 20 mg  20 mg Oral Daily Shona Terry N, DO   20 mg at 09/26/24 9153   umeclidinium bromide  (INCRUSE ELLIPTA ) 62.5 MCG/ACT 1 puff  1 puff Inhalation Daily Vann, Jessica U, DO   1 puff at 09/26/24 0805     Discharge Medications: Allergies as of 09/26/2024       Reactions   Ziprasidone  Hcl Other (See Comments)   Psychotic reaction   Penicillinase  Rash, Swelling   Sulfa Antibiotics Other (See Comments), Hives   Sulfamethoxazole-trimethoprim Hives, Other (See Comments)   Dermatitis Bullous lesions   Penicillins Other (See Comments)   Unknown    Latex Itching        Medication List     PAUSE taking these medications    amLODipine  5 MG tablet Wait to take this until your doctor or  other care provider tells you to start again. Commonly known as: NORVASC  Take 1 tablet (5 mg total) by mouth daily.   losartan  100 MG tablet Wait to take this until your doctor or other care provider tells you to start again. Commonly known as: COZAAR  Take 100 mg by mouth daily.       STOP taking these medications    cloNIDine  0.1 MG tablet Commonly known as: CATAPRES    cyclobenzaprine 5 MG tablet Commonly known as: FLEXERIL   fluconazole 100 MG tablet Commonly known as: DIFLUCAN   hydrOXYzine  25 MG capsule Commonly known as: VISTARIL    hydrOXYzine  25 MG tablet Commonly known as: ATARAX    ibuprofen  800 MG tablet Commonly known as: ADVIL    levocetirizine 5 MG tablet Commonly known as: XYZAL    NovoLOG  FlexPen 100 UNIT/ML FlexPen Generic drug: insulin  aspart   omeprazole  40 MG capsule Commonly known as: PRILOSEC   Ozempic  (0.25 or 0.5 MG/DOSE) 2 MG/1.5ML Sopn Generic drug: Semaglutide (0.25 or 0.5MG /DOS)   sennosides-docusate sodium  8.6-50 MG tablet Commonly known as: SENOKOT-S   tiotropium 18 MCG inhalation capsule Commonly known as: SPIRIVA        TAKE these medications    benztropine  1 MG tablet Commonly known as: COGENTIN  Take 1 mg by mouth 2 (two) times daily. What changed: Another medication with the same name was removed. Continue taking this medication, and follow the directions you see here. The timing of this medication is very important.   acetaminophen  325 MG tablet Commonly known as: TYLENOL  Take 650 mg by mouth every 6 (six) hours as needed for mild pain or moderate pain.   acetaZOLAMIDE  ER 500  MG capsule Commonly known as: DIAMOX  Take 1 capsule (500 mg total) by mouth 2 (two) times daily.   albuterol  108 (90 Base) MCG/ACT inhaler Commonly known as: VENTOLIN  HFA Inhale 2 puffs into the lungs every 4 (four) hours as needed for wheezing or shortness of breath.   Aspirin  EC Adult Low Dose 81 MG tablet Generic drug: aspirin  EC Take 81 mg by mouth daily.   atorvastatin  10 MG tablet Commonly known as: LIPITOR Take 1 tablet (10 mg total) by mouth every evening.   cefadroxil  500 MG capsule Commonly known as: DURICEF Take 1 capsule (500 mg total) by mouth 2 (two) times daily.   colchicine  0.6 MG tablet Take 1 tablet (0.6 mg total) by mouth daily.   divalproex  250 MG DR tablet Commonly known as: DEPAKOTE  Take 5 tablets (1,250 mg total) by mouth every evening. What changed: Another medication with the same name was removed. Continue taking this medication, and follow the directions you see here.   doxycycline  100 MG tablet Commonly known as: VIBRA -TABS Take 1 tablet (100 mg total) by mouth every 12 (twelve) hours.   furosemide  20 MG tablet Commonly known as: LASIX  Take 1 tablet (20 mg total) by mouth 2 (two) times daily.   gabapentin  400 MG capsule Commonly known as: NEURONTIN  Take 1 capsule (400 mg total) by mouth 3 (three) times daily.   Incruse Ellipta  62.5 MCG/ACT Aepb Generic drug: umeclidinium bromide  Inhale 1 puff into the lungs daily.   insulin  glargine 100 UNIT/ML Solostar Pen Commonly known as: LANTUS  Inject 40 Units into the skin at bedtime.   insulin  lispro 100 UNIT/ML KwikPen Commonly known as: HUMALOG Inject 5 Units into the skin 3 (three) times daily. Before meals. Hold for blood sugar less than 100   metFORMIN  1000 MG tablet Commonly known as: GLUCOPHAGE  Take 1 tablet (1,000 mg  total) by mouth 2 (two) times daily with a meal.   pantoprazole  40 MG tablet Commonly known as: PROTONIX  Take 40 mg by mouth daily.   polyethylene glycol 17 g  packet Commonly known as: MIRALAX  / GLYCOLAX  Take 17 g by mouth daily. What changed: Another medication with the same name was removed. Continue taking this medication, and follow the directions you see here.   potassium chloride  10 MEQ tablet Commonly known as: KLOR-CON  Take 1 tablet (10 mEq total) by mouth daily. Take While taking Diamox /acetazolamide    QC Melatonin Max St 5 MG Tabs Generic drug: melatonin Take 5 mg by mouth at bedtime as needed.   QUEtiapine  300 MG tablet Commonly known as: SEROQUEL  Take 300 mg by mouth at bedtime. What changed: Another medication with the same name was removed. Continue taking this medication, and follow the directions you see here.   risperiDONE  2 MG tablet Commonly known as: RISPERDAL  Take 2 mg by mouth 2 (two) times daily. What changed: Another medication with the same name was removed. Continue taking this medication, and follow the directions you see here.   Spiriva  HandiHaler 18 MCG Caps Generic drug: Tiotropium Bromide  Irrigate with 1 capsule as directed daily.   Systane Ultra PF 0.4-0.3 % Soln Generic drug: Polyethyl Glyc-Propyl Glyc PF Place 2 drops into both eyes in the morning, at noon, and at bedtime.   tamoxifen  20 MG tablet Commonly known as: NOLVADEX  Take 1 tablet (20 mg total) by mouth daily.   traMADol  50 MG tablet Commonly known as: ULTRAM  Take 1 tablet (50 mg total) by mouth every 8 (eight) hours as needed for severe pain (pain score 7-10) or moderate pain (pain score 4-6).   triamcinolone  cream 0.1 % Commonly known as: KENALOG  Apply 1 Application topically 2 (two) times daily.   VISINE DRY EYE OP Place 1 drop into both eyes daily.   Vitamin D  (Ergocalciferol ) 1.25 MG (50000 UNIT) Caps capsule Commonly known as: DRISDOL  Take 50,000 Units by mouth every Saturday.               Discharge Care Instructions  (From admission, onward)           Start     Ordered   09/26/24 0000  Discharge wound care:        Comments: Elevate extremity Wrap foot with gauze and keep clean/dry until podiatry follow up   09/26/24 1126             Relevant Imaging Results:  Relevant Lab Results:   Additional Information SS# [id number]  Noreen KATHEE Pinal, LCSWA     "

## 2024-09-26 NOTE — TOC Transition Note (Signed)
 Transition of Care [facility]) - Discharge Note   Patient Details  Name: Regina Watson MRN: [id number] Date of Birth: April 19, 1966  [facility] of [facility] CM/SW Contact:  Noreen KATHEE Pinal, LCSWA Phone Number: 09/26/2024, 1:39 PM   Clinical Narrative:     CSW spoke with Ty and made her aware of DC today . Ty shared that her transportation resources were tight due to snow in the driveway / yard. CSW provided alternative option for private pay and Ty stated that she will be making her own arrangements to get patient back to group home. CSW also shared with Ty that PT recommended HH but no agency will accept due to insurance , but OP PT is available. Ty agreeable to referral and confirmed being able to provide patient with transportation. ICM signing off.   Final next level of care: Group Home (With Outpatient PT) Barriers to Discharge: Barriers Resolved   Patient Goals and CMS Choice Patient states their goals for this hospitalization and ongoing recovery are:: return back to ALF CMS Medicare.gov Compare Post Acute Care list provided to:: Legal Guardian (attempted to reach) Choice offered to / list presented to : [facility] POA / Guardian      Discharge Placement                Patient to be transferred to facility by: Facility Name of family member notified: Ty - production designer, theatre/television/film at ALF ; Also attempted to reach [facility] Patient and family notified of of transfer: 09/26/24  Discharge Plan and Services Additional resources added to the After Visit Summary for   In-house Referral: Clinical Social Work Discharge Planning Services: NA Post Acute Care Choice: Resumption of Svcs/PTA Provider, NA                    HH Arranged:  (Patient insurance does does not cover for HHPT) HH Agency:  (No agency will accept insurance for [facility])        Social Drivers of Health (SDOH) Interventions SDOH Screenings   Food Insecurity: No Food Insecurity (09/25/2024)  Housing: Low Risk (09/25/2024)  Transportation  Needs: No Transportation Needs (09/25/2024)  Utilities: Not At Risk (09/25/2024)  Financial Resource Strain: Low Risk (09/07/2022)   Received from [facility]  Tobacco Use: High Risk (09/24/2024)     Readmission Risk Interventions    09/26/2024   12:56 PM 09/25/2024   10:23 AM  Readmission Risk Prevention Plan  Transportation Screening Complete Complete  PCP or Specialist Appt within 3-5 Days  Complete  HRI or Home Care Consult Complete Complete  Social Work Consult for Recovery Care Planning/Counseling Complete Complete  Palliative Care Screening Not Applicable Not Applicable  Medication Review Oceanographer) Complete Complete

## 2024-09-26 NOTE — Discharge Summary (Signed)
 "     Physician Discharge Summary  Regina Watson FMW:[id number] DOB: 04-Mar-1966 DOA: 09/24/2024  PCP: Jerome Heron Ruth, PA-C  Admit date: 09/24/2024 Discharge date: 09/26/2024  Admitted From:  Discharge disposition: Back to group home   Recommendations for Outpatient Follow-Up:   Monitor blood sugars closely-Will need adjustment of insulin  for better blood sugar control once diet has been managed Appointment made for next week with podiatry to follow-up on cellulitis/wounds Complete course of antibiotics with close follow-up with podiatry   Discharge Diagnosis:   Principal Problem:   Diabetic foot infection (HCC)    Discharge Condition: Improved.  Diet recommendation:   Carbohydrate-modified.  Wound care: None.  Code status: Full.   History of Present Illness:   Regina Watson is a 59 y.o. female with medical history significant for uncontrolled type 2 diabetes, obesity, stage Ia left invasive ductal carcinoma (post left lumpectomy, sentinel lymph node surgery, post adjuvant radiation therapy), on tamoxifen , schizophrenia, chronic lower extremity edema, blindness, current tobacco user, who presents to the ER due to left foot pain.  Reports a blister in the dorsal base of her left great toe that burst.  It started draining pus earlier today.  Has significant pain with pressure.  Denies subjective fevers.  Admits to chills.  Has seen a podiatrist who cuts and trims her toenails.     In the ER, left foot x-ray showed no radiographic evidence of osteomyelitis.  The patient received broad-spectrum IV antibiotics due to concern for diabetic foot infection.  The patient is afebrile with no leukocytosis.  Sed rate 19.  CRP is pending.  Admitted by [facility], hospitalist service.   Hospital Course by Problem:   Left diabetic foot infection, POA No evidence of osteomyelitis on x-ray of the left foot nor MRI -Changed to p.o. antibiotics to finish course -Needs to control blood  sugar -MRI did not show any surgical need - ABIs with good blood flow - Appreciate general surgery - Have arranged podiatry follow-up on Wednesday the 11th   Type 2 diabetes with hyperglycemia Presented with serum glucose of 372. Last hemoglobin A1c 6.2 on 01/02/2023 but since elevated -Will need better diet control and resumption of oral metformin  - Adjust insulin  as able for better coverage   Non anion gap metabolic acidosis in the setting of lactic acidosis -Resolved   Lactic acidosis Resolved with fluids   Hypokalemia - Repleted  Elevated alkaline phosphatase Alkaline phosphatase 129 Nonspecific Continue to monitor outpatient   Diabetic polyneuropathy Resume home gabapentin    Chronic bilateral lower extremity edema Elevate lower extremities -Resume diuretics   Ambulatory dysfunction Uses a walker at baseline -Fall precautions   Obesity Estimated body mass index is 30.37 kg/m as calculated from the following:   Height as of this encounter: 5' 8 (1.727 m).   Weight as of this encounter: 90.6 kg.    Schizophrenia Major depressive disorder Resume home regimen.         Medical Consultants:    Surgery  Discharge Exam:   Vitals:   09/26/24 0805 09/26/24 0830  BP:  122/76  Pulse:  92  Resp:  17  Temp:    SpO2: 99% 92%   Vitals:   09/26/24 0051 09/26/24 0408 09/26/24 0805 09/26/24 0830  BP: (!) 116/49 (!) 104/53  122/76  Pulse: 93 96  92  Resp: 18 18  17   Temp: 99 F (37.2 C) 98.6 F (37 C)    TempSrc: Oral Oral    SpO2: 97% 97% 99%  92%  Weight:      Height:        General exam: Appears calm and comfortable.  States her pain is better controlled and would like to go home   The results of significant diagnostics from this hospitalization (including imaging, microbiology, ancillary and laboratory) are listed below for reference.     Procedures and Diagnostic Studies:   MR FOOT LEFT W WO CONTRAST Result Date: 09/25/2024 EXAM: MRI of the  left Foot without and with contrast. 09/25/2024 10:49:32 AM TECHNIQUE: Multiplanar multisequence MRI of the left foot was performed without and with the administration of 9 mL of gadobutrol  (GADAVIST ) 1 MMOL/ML intravenous contrast. COMPARISON: Comparison radiographs 09/24/2024 and ankle MRI 05/25/2011. CLINICAL HISTORY: Left foot pain, abscess. FINDINGS: LISFRANC JOINT: Visualized Lisfranc ligament is intact. No significant Lisfranc interval widening or significant periligamentous edema. BONE MARROW: Deformity of the distal tibia related to remote fracture partially included on today's exam. Secondary arthropathy with chondral thinning in the tibiotalar joint. Mild dorsal midfoot degenerative findings. 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 Metal artifact along the distal margin of the remaining 2nd metatarsal. Potentially some mild flattening of the 1st metatarsal head. Spurring between the 1st metatarsal head and adjacent sesamoids. No current marrow edema or enhancement to indicate active osteomyelitis. No acute fracture or aggressive marrow replacing lesion. 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 Metal artifact along the distal margin of the remaining 2nd metatarsal. No significant joint effusion or osseous erosions. Normal alignment. SOFT TISSUES: Prominent regional muscular atrophy. Spontaneous irregularity plantar to the 1st metatarsophalangeal joint and 1st digit sesamoids potentially from blistering and/or ulceration. No gas tracking in the soft tissues. No drainable abscess observed. Dorsal subcutaneous edema tracking into the remaining toes, cellulitis not excluded. Possible minimal blistering dorsally along the forefoot as on image 39 series 4. TENDONS: Visualized flexor and extensor tendons are intact without tenosynovitis. IMPRESSION: 1. No  drainable abscess and no MRI evidence of active osteomyelitis. 2. Dorsal subcutaneous edema tracking into the remaining toes, cellulitis not excluded, with possible minimal dorsal forefoot blistering. 3. Plantar soft tissue irregularity at the level of the 1st metatarsophalangeal joint and 1st digit sesamoids, possibly from blistering and/or ulceration, without soft tissue gas. 4. Remote distal tibial fracture deformity with secondary tibiotalar arthropathy and chondral thinning. 5. Prominent regional muscular atrophy. 6. 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 7. 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 8. Mild dorsal midfoot degenerative changes. Electronically signed by: Ryan Salvage MD 09/25/2024 12:06 PM EST RP Workstation: [id number]   US  ARTERIAL ABI (SCREENING LOWER EXTREMITY) Result Date: 09/25/2024 CLINICAL DATA:  Cellulitis in the setting of diabetes. LEFT heel blister with purulent drainage for 2 weeks. Prior LEFT second toe amputation. EXAM: NONINVASIVE PHYSIOLOGIC VASCULAR STUDY OF BILATERAL LOWER EXTREMITIES TECHNIQUE: Evaluation of both lower extremities were performed at rest, including calculation of ankle-brachial indices with single level pressure measurements and doppler recording. COMPARISON:  None available. FINDINGS: Right ABI:  1.33 Left ABI:  1.32 Right Lower Extremity: Monophasic waveform seen in the posterior tibial artery, however assessment is limited due to suboptimal waveform. Multiphasic waveform seen in the dorsalis pedis artery. Left Lower Extremity: Monophasic waveform seen in the  posterior tibial artery however, assessment is limited due to suboptimal waveform. Multiphasic waveform seen in the dorsalis  pedis artery. 1.0-1.4 Normal IMPRESSION: No definitive evidence of significant lower extremity peripheral arterial disease. Electronically Signed   By: Aliene Lloyd M.D.   On: 09/25/2024 11:59   DG Foot Complete Left Result Date: 09/24/2024 EXAM: 3 OR MORE VIEW(S) XRAY OF THE LEFT FOOT 09/24/2024 11:25:00 PM COMPARISON: None available. CLINICAL HISTORY: Foot wound. 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 Severe degenerative changes about the 1st MTP joint are unchanged from 05/24/2011. No radiographic evidence of osteomyelitis. Tibiotalar joint deformity and severe arthritis . No acute fracture. No malalignment. SOFT TISSUES: Subcutaneous soft tissue edema. IMPRESSION: 1. No radiographic evidence of osteomyelitis. Electronically signed by: Norman Gatlin MD 09/24/2024 11:42 PM EST RP Workstation: [id number]     Labs:   Basic Metabolic Panel: Recent Labs  Lab 09/25/24 0000 09/26/24 0434  NA 140 144  K 4.1 3.4*  CL 107 112*  CO2 20* 23  GLUCOSE 372* 187*  BUN 17 15  CREATININE 0.83 0.75  CALCIUM  8.9 8.6*   GFR Estimated Creatinine Clearance: 92.5 mL/min (by C-G formula based on SCr of 0.75 mg/dL). Liver Function Tests: Recent Labs  Lab 09/25/24 0000  AST 15  ALT 11  ALKPHOS 129*  BILITOT 0.3  PROT 8.3*  ALBUMIN 3.8   No results for input(s): LIPASE, AMYLASE in the last 168 hours. No results for input(s): AMMONIA in the last 168 hours. Coagulation profile No results for input(s): INR, PROTIME in the last 168 hours.  CBC: Recent Labs  Lab 09/25/24 0000 09/26/24 0434  WBC 5.7 9.0  NEUTROABS 3.6  --   HGB 11.8* 10.1*  HCT 38.4 31.4*  MCV 98.5 94.3  PLT 196 210   Cardiac Enzymes: No results for input(s): CKTOTAL, CKMB, CKMBINDEX, TROPONINI in the last 168 hours. BNP: Invalid input(s): POCBNP CBG: Recent Labs  Lab 09/25/24 0748 09/25/24 1204 09/25/24 1627 09/25/24 2132 09/26/24 0727   GLUCAP 329* 208* 132* 191* 161*   D-Dimer No results for input(s): DDIMER in the last 72 hours. Hgb A1c Recent Labs    09/25/24 0000  HGBA1C 10.0*   Lipid Profile No results for input(s): CHOL, HDL, LDLCALC, TRIG, CHOLHDL, LDLDIRECT in the last 72 hours. Thyroid  function studies No results for input(s): TSH, T4TOTAL, T3FREE, THYROIDAB in the last 72 hours.  Invalid input(s): FREET3 Anemia work up No results for input(s): VITAMINB12, FOLATE, FERRITIN, TIBC, IRON, RETICCTPCT in the last 72 hours. Microbiology Recent Results (from the past 240 hours)  Blood Cultures x 2 sites     Status: None (Preliminary result)   Collection Time: 09/25/24 12:00 AM   Specimen: BLOOD  Result Value Ref Range Status   Specimen Description BLOOD BLOOD RIGHT HAND  Final   Special Requests   Final    BOTTLES DRAWN AEROBIC ONLY Blood Culture results may not be optimal due to an inadequate volume of blood received in culture bottles   Culture   Final    NO GROWTH 1 DAY Performed at [facility], [address] [place], [place] [address]    Report Status PENDING  Incomplete  Blood Cultures x 2 sites     Status: None (Preliminary result)   Collection Time: 09/25/24  2:12 AM   Specimen: BLOOD  Result Value Ref Range Status   Specimen Description BLOOD BLOOD LEFT ARM  Final   Special Requests   Final  BOTTLES DRAWN AEROBIC AND ANAEROBIC Blood Culture results may not be optimal due to an inadequate volume of blood received in culture bottles   Culture   Final    NO GROWTH 1 DAY Performed at [facility], [address], [place], [place] [address]    Report Status PENDING  Incomplete  MRSA Next Gen by PCR, Nasal     Status: None   Collection Time: 09/25/24  4:22 PM   Specimen: Nasal Mucosa; Nasal Swab  Result Value Ref Range Status   MRSA by PCR Next Gen NOT DETECTED NOT DETECTED Final    Comment: (NOTE) The GeneXpert MRSA Assay (FDA approved for NASAL  specimens only), is one component of a comprehensive MRSA colonization surveillance program. It is not intended to diagnose MRSA infection nor to guide or monitor treatment for MRSA infections. Test performance is not FDA approved in patients less than 84 years old. Performed at [facility], [address] [place], [place] [address]      Discharge Instructions:   Discharge Instructions     Diet Carb Modified   Complete by: As directed    Discharge instructions   Complete by: As directed    Podiatry follow up: Feb 11 at 10:15  Complete abx course Elevate foot Monitor blood sugars closely-- needs better coverage for foot healing   Discharge wound care:   Complete by: As directed    Elevate extremity Wrap foot with gauze and keep clean/dry until podiatry follow up   Increase activity slowly   Complete by: As directed       Allergies as of 09/26/2024       Reactions   Ziprasidone  Hcl Other (See Comments)   Psychotic reaction   Penicillinase Rash, Swelling   Sulfa Antibiotics Other (See Comments), Hives   Sulfamethoxazole-trimethoprim Hives, Other (See Comments)   Dermatitis Bullous lesions   Penicillins Other (See Comments)   Unknown    Latex Itching        Medication List     PAUSE taking these medications    amLODipine  5 MG tablet Wait to take this until your doctor or other care provider tells you to start again. Commonly known as: NORVASC  Take 1 tablet (5 mg total) by mouth daily.   losartan  100 MG tablet Wait to take this until your doctor or other care provider tells you to start again. Commonly known as: COZAAR  Take 100 mg by mouth daily.       STOP taking these medications    cloNIDine  0.1 MG tablet Commonly known as: CATAPRES    cyclobenzaprine 5 MG tablet Commonly known as: FLEXERIL   fluconazole 100 MG tablet Commonly known as: DIFLUCAN   hydrOXYzine  25 MG capsule Commonly known as: VISTARIL    hydrOXYzine  25 MG tablet Commonly  known as: ATARAX    ibuprofen  800 MG tablet Commonly known as: ADVIL    levocetirizine 5 MG tablet Commonly known as: XYZAL    NovoLOG  FlexPen 100 UNIT/ML FlexPen Generic drug: insulin  aspart   omeprazole  40 MG capsule Commonly known as: PRILOSEC   Ozempic  (0.25 or 0.5 MG/DOSE) 2 MG/1.5ML Sopn Generic drug: Semaglutide (0.25 or 0.5MG /DOS)   sennosides-docusate sodium  8.6-50 MG tablet Commonly known as: SENOKOT-S   tiotropium 18 MCG inhalation capsule Commonly known as: SPIRIVA        TAKE these medications    benztropine  1 MG tablet Commonly known as: COGENTIN  Take 1 mg by mouth 2 (two) times daily. What changed: Another medication with the same name was removed. Continue taking  this medication, and follow the directions you see here. The timing of this medication is very important.   acetaminophen  325 MG tablet Commonly known as: TYLENOL  Take 650 mg by mouth every 6 (six) hours as needed for mild pain or moderate pain.   acetaZOLAMIDE  ER 500 MG capsule Commonly known as: DIAMOX  Take 1 capsule (500 mg total) by mouth 2 (two) times daily.   albuterol  108 (90 Base) MCG/ACT inhaler Commonly known as: VENTOLIN  HFA Inhale 2 puffs into the lungs every 4 (four) hours as needed for wheezing or shortness of breath.   Aspirin  EC Adult Low Dose 81 MG tablet Generic drug: aspirin  EC Take 81 mg by mouth daily.   atorvastatin  10 MG tablet Commonly known as: LIPITOR Take 1 tablet (10 mg total) by mouth every evening.   cephALEXin  500 MG capsule Commonly known as: KEFLEX  Take 1 capsule (500 mg total) by mouth every 12 (twelve) hours.   colchicine  0.6 MG tablet Take 1 tablet (0.6 mg total) by mouth daily.   divalproex  250 MG DR tablet Commonly known as: DEPAKOTE  Take 5 tablets (1,250 mg total) by mouth every evening. What changed: Another medication with the same name was removed. Continue taking this medication, and follow the directions you see here.   doxycycline  100 MG  tablet Commonly known as: VIBRA -TABS Take 1 tablet (100 mg total) by mouth every 12 (twelve) hours.   furosemide  20 MG tablet Commonly known as: LASIX  Take 1 tablet (20 mg total) by mouth 2 (two) times daily.   gabapentin  400 MG capsule Commonly known as: NEURONTIN  Take 1 capsule (400 mg total) by mouth 3 (three) times daily.   Incruse Ellipta  62.5 MCG/ACT Aepb Generic drug: umeclidinium bromide  Inhale 1 puff into the lungs daily.   insulin  glargine 100 UNIT/ML Solostar Pen Commonly known as: LANTUS  Inject 40 Units into the skin at bedtime.   insulin  lispro 100 UNIT/ML KwikPen Commonly known as: HUMALOG Inject 5 Units into the skin 3 (three) times daily. Before meals. Hold for blood sugar less than 100   metFORMIN  1000 MG tablet Commonly known as: GLUCOPHAGE  Take 1 tablet (1,000 mg total) by mouth 2 (two) times daily with a meal.   pantoprazole  40 MG tablet Commonly known as: PROTONIX  Take 40 mg by mouth daily.   polyethylene glycol 17 g packet Commonly known as: MIRALAX  / GLYCOLAX  Take 17 g by mouth daily. What changed: Another medication with the same name was removed. Continue taking this medication, and follow the directions you see here.   potassium chloride  10 MEQ tablet Commonly known as: KLOR-CON  Take 1 tablet (10 mEq total) by mouth daily. Take While taking Diamox /acetazolamide    QC Melatonin Max St 5 MG Tabs Generic drug: melatonin Take 5 mg by mouth at bedtime as needed.   QUEtiapine  300 MG tablet Commonly known as: SEROQUEL  Take 300 mg by mouth at bedtime. What changed: Another medication with the same name was removed. Continue taking this medication, and follow the directions you see here.   risperiDONE  2 MG tablet Commonly known as: RISPERDAL  Take 2 mg by mouth 2 (two) times daily. What changed: Another medication with the same name was removed. Continue taking this medication, and follow the directions you see here.   Spiriva  HandiHaler 18 MCG  Caps Generic drug: Tiotropium Bromide  Irrigate with 1 capsule as directed daily.   Systane Ultra PF 0.4-0.3 % Soln Generic drug: Polyethyl Glyc-Propyl Glyc PF Place 2 drops into both eyes in the morning, at noon,  and at bedtime.   tamoxifen  20 MG tablet Commonly known as: NOLVADEX  Take 1 tablet (20 mg total) by mouth daily.   traMADol  50 MG tablet Commonly known as: ULTRAM  Take 1 tablet (50 mg total) by mouth every 8 (eight) hours as needed for severe pain (pain score 7-10) or moderate pain (pain score 4-6).   triamcinolone  cream 0.1 % Commonly known as: KENALOG  Apply 1 Application topically 2 (two) times daily.   VISINE DRY EYE OP Place 1 drop into both eyes daily.   Vitamin D  (Ergocalciferol ) 1.25 MG (50000 UNIT) Caps capsule Commonly known as: DRISDOL  Take 50,000 Units by mouth every Saturday.               Discharge Care Instructions  (From admission, onward)           Start     Ordered   09/26/24 0000  Discharge wound care:       Comments: Elevate extremity Wrap foot with gauze and keep clean/dry until podiatry follow up   09/26/24 1126            Follow-up Information     Jerome Heron Ruth, PA-C Follow up in 1 week(s).   Specialty: Physician Assistant Contact information: [address] [place], [place] [address] [phone]         Loreda Hacker, DPM Follow up.   Specialty: Podiatry Contact information: [address] [place] [address] [place] [address] [phone]                  Time coordinating discharge: 45 min  Signed:  Harlene RAYMOND Bowl DO  [facility] 09/26/2024, 11:26 AM      "

## 2024-09-27 LAB — CULTURE, BLOOD (ROUTINE X 2)
Culture: NO GROWTH
Culture: NO GROWTH

## 2024-10-02 ENCOUNTER — Ambulatory Visit: Payer: MEDICAID | Admitting: Podiatry
